# Patient Record
Sex: Female | Born: 1935 | Race: White | Hispanic: No | Smoking: Former smoker
Health system: Southern US, Community
[De-identification: ages and names within clinical notes are randomized; demographics above are authoritative.]

## PROBLEM LIST (undated history)

## (undated) DIAGNOSIS — M8580 Other specified disorders of bone density and structure, unspecified site: Secondary | ICD-10-CM

## (undated) DIAGNOSIS — E039 Hypothyroidism, unspecified: Secondary | ICD-10-CM

## (undated) DIAGNOSIS — Z860101 Personal history of adenomatous and serrated colon polyps: Secondary | ICD-10-CM

## (undated) DIAGNOSIS — Z8554 Personal history of malignant neoplasm of ureter: Secondary | ICD-10-CM

## (undated) DIAGNOSIS — I10 Essential (primary) hypertension: Secondary | ICD-10-CM

## (undated) DIAGNOSIS — Z8551 Personal history of malignant neoplasm of bladder: Secondary | ICD-10-CM

## (undated) DIAGNOSIS — K5790 Diverticulosis of intestine, part unspecified, without perforation or abscess without bleeding: Secondary | ICD-10-CM

## (undated) DIAGNOSIS — M5030 Other cervical disc degeneration, unspecified cervical region: Secondary | ICD-10-CM

## (undated) DIAGNOSIS — R351 Nocturia: Secondary | ICD-10-CM

## (undated) DIAGNOSIS — R001 Bradycardia, unspecified: Secondary | ICD-10-CM

## (undated) DIAGNOSIS — Z86010 Personal history of colonic polyps: Secondary | ICD-10-CM

## (undated) DIAGNOSIS — M1990 Unspecified osteoarthritis, unspecified site: Secondary | ICD-10-CM

## (undated) HISTORY — DX: Diverticulosis of intestine, part unspecified, without perforation or abscess without bleeding: K57.90

## (undated) HISTORY — PX: TRANSURETHRAL RESECTION OF BLADDER: SUR1395

## (undated) HISTORY — DX: Essential (primary) hypertension: I10

## (undated) HISTORY — PX: CATARACT EXTRACTION W/ INTRAOCULAR LENS  IMPLANT, BILATERAL: SHX1307

## (undated) HISTORY — DX: Other specified disorders of bone density and structure, unspecified site: M85.80

## (undated) HISTORY — PX: BACK SURGERY: SHX140

## (undated) HISTORY — DX: Unspecified osteoarthritis, unspecified site: M19.90

## (undated) HISTORY — DX: Hypothyroidism, unspecified: E03.9

---
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
## 1999-11-19 ENCOUNTER — Other Ambulatory Visit: Admission: RE | Admit: 1999-11-19 | Discharge: 1999-11-19 | Payer: Self-pay | Admitting: Family Medicine

## 2001-06-21 ENCOUNTER — Encounter: Payer: Self-pay | Admitting: Family Medicine

## 2001-06-21 ENCOUNTER — Encounter: Admission: RE | Admit: 2001-06-21 | Discharge: 2001-06-21 | Payer: Self-pay | Admitting: Family Medicine

## 2001-11-22 ENCOUNTER — Encounter: Payer: Self-pay | Admitting: Internal Medicine

## 2006-08-17 ENCOUNTER — Other Ambulatory Visit: Admission: RE | Admit: 2006-08-17 | Discharge: 2006-08-17 | Payer: Self-pay | Admitting: Family Medicine

## 2006-08-17 ENCOUNTER — Encounter: Admission: RE | Admit: 2006-08-17 | Discharge: 2006-08-17 | Payer: Self-pay | Admitting: Family Medicine

## 2006-08-31 ENCOUNTER — Encounter: Admission: RE | Admit: 2006-08-31 | Discharge: 2006-08-31 | Payer: Self-pay | Admitting: Family Medicine

## 2007-09-06 HISTORY — PX: LAMINECTOMY: SHX219

## 2007-11-21 ENCOUNTER — Encounter (INDEPENDENT_AMBULATORY_CARE_PROVIDER_SITE_OTHER): Payer: Self-pay | Admitting: *Deleted

## 2007-11-21 ENCOUNTER — Encounter: Admission: RE | Admit: 2007-11-21 | Discharge: 2007-11-21 | Payer: Self-pay | Admitting: Family Medicine

## 2007-12-12 ENCOUNTER — Encounter (INDEPENDENT_AMBULATORY_CARE_PROVIDER_SITE_OTHER): Payer: Self-pay | Admitting: *Deleted

## 2007-12-12 ENCOUNTER — Encounter: Admission: RE | Admit: 2007-12-12 | Discharge: 2007-12-12 | Payer: Self-pay | Admitting: Family Medicine

## 2007-12-26 ENCOUNTER — Ambulatory Visit (HOSPITAL_COMMUNITY): Admission: RE | Admit: 2007-12-26 | Discharge: 2007-12-26 | Payer: Self-pay | Admitting: Urology

## 2007-12-26 ENCOUNTER — Encounter (INDEPENDENT_AMBULATORY_CARE_PROVIDER_SITE_OTHER): Payer: Self-pay | Admitting: Urology

## 2008-02-25 ENCOUNTER — Encounter: Admission: RE | Admit: 2008-02-25 | Discharge: 2008-02-25 | Payer: Self-pay | Admitting: Family Medicine

## 2008-05-30 ENCOUNTER — Inpatient Hospital Stay (HOSPITAL_COMMUNITY): Admission: RE | Admit: 2008-05-30 | Discharge: 2008-06-01 | Payer: Self-pay | Admitting: Orthopedic Surgery

## 2008-10-16 ENCOUNTER — Encounter (INDEPENDENT_AMBULATORY_CARE_PROVIDER_SITE_OTHER): Payer: Self-pay | Admitting: *Deleted

## 2008-12-12 DIAGNOSIS — K5730 Diverticulosis of large intestine without perforation or abscess without bleeding: Secondary | ICD-10-CM | POA: Insufficient documentation

## 2008-12-12 DIAGNOSIS — Z860100 Personal history of colon polyps, unspecified: Secondary | ICD-10-CM | POA: Insufficient documentation

## 2008-12-17 ENCOUNTER — Ambulatory Visit: Payer: Self-pay | Admitting: Internal Medicine

## 2008-12-17 DIAGNOSIS — C679 Malignant neoplasm of bladder, unspecified: Secondary | ICD-10-CM | POA: Insufficient documentation

## 2008-12-31 ENCOUNTER — Encounter (INDEPENDENT_AMBULATORY_CARE_PROVIDER_SITE_OTHER): Payer: Self-pay | Admitting: Urology

## 2008-12-31 ENCOUNTER — Ambulatory Visit (HOSPITAL_BASED_OUTPATIENT_CLINIC_OR_DEPARTMENT_OTHER): Admission: RE | Admit: 2008-12-31 | Discharge: 2008-12-31 | Payer: Self-pay | Admitting: Urology

## 2010-06-29 ENCOUNTER — Ambulatory Visit (HOSPITAL_BASED_OUTPATIENT_CLINIC_OR_DEPARTMENT_OTHER): Admission: RE | Admit: 2010-06-29 | Discharge: 2010-06-29 | Payer: Self-pay | Admitting: Urology

## 2010-09-26 ENCOUNTER — Encounter: Payer: Self-pay | Admitting: Family Medicine

## 2010-11-17 LAB — POCT I-STAT, CHEM 8
BUN: 15 mg/dL (ref 6–23)
Chloride: 104 mEq/L (ref 96–112)
HCT: 49 % — ABNORMAL HIGH (ref 36.0–46.0)
Potassium: 4.4 mEq/L (ref 3.5–5.1)

## 2010-12-15 LAB — URINALYSIS, ROUTINE W REFLEX MICROSCOPIC
Bilirubin Urine: NEGATIVE
Glucose, UA: NEGATIVE mg/dL
Ketones, ur: NEGATIVE mg/dL
Leukocytes, UA: NEGATIVE
pH: 5.5 (ref 5.0–8.0)

## 2010-12-15 LAB — BASIC METABOLIC PANEL
CO2: 32 mEq/L (ref 19–32)
Chloride: 99 mEq/L (ref 96–112)
GFR calc Af Amer: >60 mL/min (ref >60)
Potassium: 4.6 mEq/L (ref 3.5–5.1)

## 2010-12-15 LAB — CBC
HCT: 42.8 % (ref 36.0–46.0)
Hemoglobin: 14.6 g/dL (ref 12.0–15.0)
MCV: 95 fL (ref 78.0–100.0)
RBC: 4.51 MIL/uL (ref 3.87–5.11)
WBC: 10.9 10*3/uL — ABNORMAL HIGH (ref 4.0–10.5)

## 2010-12-15 LAB — URINE MICROSCOPIC-ADD ON

## 2011-01-18 NOTE — Op Note (Signed)
Rebecca Norman, Rebecca Norman               ACCOUNT NO.:  [id number]   MEDICAL RECORD NO.:  [id number]          PATIENT TYPE:  AMB   LOCATION:  DAY                          FACILITY:  [facility]   PHYSICIAN:  Houston M. Kimbrough, M.D.DATE OF BIRTH:  Aug 13, 1925   DATE OF PROCEDURE:  DATE OF DISCHARGE:  12/26/2007                               OPERATIVE REPORT   PREOPERATIVE DIAGNOSIS:  Bladder tumor, left orifice, lateral wall, 3  cm.   POSTOPERATIVE DIAGNOSIS:  Bladder tumor, left orifice, lateral wall, 3  cm.   OPERATION:  TURBT (3 cm) left lateral wall.   ANESTHESIA:  General.   SURGEON:  Courtney Paris, M.D.   BRIEF HISTORY:  This 77 year old lady presented with a history of  hematuria and was found to have a bladder tumor at the left base  overlying the left orifice and the left lateral wall and enters now for  a bladder tumor resection.  She has not smoked for over a year.  She had  no previous operations.  She has been a widow for 16 years, and she  enters now for definitive treatment.   DESCRIPTION OF PROCEDURE:  The patient was placed on the operating table  in the dorsal lithotomy position and after satisfactory induction of  general anesthesia, was prepped and draped with Betadine in the usual  sterile fashion.  Time-out was then done and the patient confirmed.  She  was given Ancef IV preoperatively.  The 26 resectoscope was inserted  with continuous flow.  The bladder was carefully inspected.  No other  lesions were seen except on the left lateral wall just on top of the  left orifice and extending laterally for a short distance.  It measured  about 3 cm across.  It looked papillary in nature.  After photographs  were made, the tumor was resected.  It did not seem to be invasive, and  the resection was complete.  I lightly fulgurated the base because it  was overlying the orifice, and hemostasis was quite good.  The chips  were sent for pathological examination.  The  scope was removed, the  bladder drained and a #16 Foley catheter inserted.  The irrigant was  clear.  She qualified for the STI-612 bladder cancer study protocol, so  she was sent to recovery room and the study drug, either a placebo or a  mitomycin-like drug, will be instilled into the bladder, and then PACU.  She was sent to the PACU in satisfactory condition and will be kept  overnight because of her age.      Courtney Paris, M.D.  Electronically Signed     HMK/MEDQ  D:  12/26/2007  T:  12/26/2007  Job:  [id number]

## 2011-01-18 NOTE — Op Note (Signed)
NAMEJUSTIS, DUPAS               ACCOUNT NO.:  [id number]   MEDICAL RECORD NO.:  [id number]          PATIENT TYPE:  OIB   LOCATION:  1616                         FACILITY:  [facility]   PHYSICIAN:  Georges Lynch. Gioffre, M.D.DATE OF BIRTH:  [birth year]   DATE OF PROCEDURE:  05/30/2008  DATE OF DISCHARGE:                               OPERATIVE REPORT   SURGEON:  Georges Lynch. Darrelyn Hillock, M.D.   ASSISTANT:  Jene Every, M.D.   PREOPERATIVE DIAGNOSES:  1. Spinal stenosis severe at L3-4.  2. Large herniated lumbar disk at L3-4 on the left.   POSTOPERATIVE DIAGNOSES:  1. Spinal stenosis severe at L3-4.  2. Large herniated lumbar disk at L3-4 on the left.   OPERATION:  1. Complete decompressive lumbar laminectomy at L3-4.  2. Microdiskectomy L3-4 on the left with foraminotomies.   PROCEDURE IN DETAIL:  Under general anesthesia routine orthopedic prep  and draping of the lower back was carried out.  Two needles were placed  in the back for localization purposes and an x-ray was taken.  The  patient had 1 g of IV Ancef preop.  At this time after x-ray was taken  two needles were removed and incision was made over the L3-4 space.  Bleeders identified and cauterized.  The incision was extended  proximally and distally for exposure purposes.  I then stripped the  muscle from the spinous process and the lamina bilaterally.  The self-  retaining retractors were inserted.  At this time I then removed as I  mentioned the spinous process of L3, a part of L2 and a part of L4.  I  then went down and did a complete lumbar laminectomy and I preserved the  facets.  I went down centrally and removed the ligamentum flavum after  the microscope was brought in.  We did protect the dura at all times.  At this time the ligamentum flavum was noted to be thickened.  We gently  removed it.  I went out far laterally out into the lateral gutter there  on the left and decompressed the recess and gently retracted the  dura  and there was an extremely large herniated disk.  We cauterized lateral  recess veins with a bipolar.  Following that we made a cruciate incision  into the disk space and did a microdiskectomy and there were large  fragments of disk removed.  Once this was completed we went in with a  nerve hook as well as the Epstein curettes as well and made several  passes into the space medially and laterally as well.  We went up under  the root.  The root now was free.  The dura was free.  We were able to  easily pass a hockey-stick out the foramen of the 4 and 3 root.  We  thoroughly irrigated out the area, loosely inserted some FloSeal then  loosely applied some Gelfoam away from the dura but into  the facet regions.  The wound then was closed in layers in the usual  fashion but I did leave a small deep proximal and distal  part of the  wound open for drainage purposes.  The subcu was closed with 0 Vicryl,  skin with metal staples and a sterile Neosporin dressing was applied.  The patient left the operating room in satisfactory condition.           ______________________________  Georges Lynch Darrelyn Hillock, M.D.     RAG/MEDQ  D:  05/30/2008  T:  05/31/2008  Job:  [id number]   cc:   Courtney Paris, M.D.  Fax: [phone]   Donia Guiles, M.D.  Fax: [phone]

## 2011-01-18 NOTE — Op Note (Signed)
[name], [name]               ACCOUNT NO.:  [id number]   MEDICAL RECORD NO.:  [id number]          PATIENT TYPE:  AMB   LOCATION:  [facility]                         FACILITY:  [facility]   PHYSICIAN:  Courtney Paris, M.D.DATE OF BIRTH:  10-Nov-1924   DATE OF PROCEDURE:  12/31/2008  DATE OF DISCHARGE:                               OPERATIVE REPORT   PREOPERATIVE DIAGNOSIS:  Recurrent bladder tumor, left posterior base.   POSTOPERATIVE DIAGNOSIS:  Recurrent bladder tumor, left posterior base.   OPERATION:  Transurethral resection of bladder tumor (1 cm).   SURGEON:  Courtney Paris, M.D.   ANESTHESIA:  General.   BRIEF HISTORY:  This 77 year old patient had a high-grade but  superficial bladder cancer that was discovered because of his hematuria  on a routine physical then April 2009.  The tumor was completely  resected.  She went on the Spectron then SPI-612 of Eoquin versus  placebo drug study as a surgery adjuvant and put into the bladder at the  time of the postoperative period for noninvasive bladder cancer.  She  had been doing well and having no irritative bladder symptoms.  The last  cysto was January 2010 was negative.  On recent surveillance cysto,  however, some papillary lesions were seen on the left posterior base  just medial to the left orifice.  Enters to have these resected at this  time.   DESCRIPTION OF PROCEDURE:  The patient was placed on the operating table  after induction of general anesthesia, was prepped and draped with  Betadine in the usual sterile fashion.  Time-out was then performed and  the patient and procedure were then reconfirmed.  21 panendoscope was  inserted.  The bladder was carefully inspected using a 30 degrees and a  70 degrees lens.  The tumor at the left posterior base just medial to  the left orifice was photographed and then biopsied with cold cup  forceps.  An area about a centimeter was resected.  The area was then  fulgurated.  Pictures were made of all the steps.  There was very little  bleeding.  Specimen was sent for pathological confirmation.  The rest  the bladder was negative.  Bladder was drained, scope removed and a  Foley catheter not inserted.  The patient was then taken to recovery  room in good condition where she will be later discharged as an  outpatient.      Courtney Paris, M.D.  Electronically Signed     HMK/MEDQ  D:  12/31/2008  T:  12/31/2008  Job:  [id number]

## 2011-04-13 ENCOUNTER — Encounter: Payer: Self-pay | Admitting: Internal Medicine

## 2011-04-13 ENCOUNTER — Ambulatory Visit (INDEPENDENT_AMBULATORY_CARE_PROVIDER_SITE_OTHER): Payer: Medicare Other | Admitting: Internal Medicine

## 2011-04-13 VITALS — BP 128/64 | HR 80 | Ht 65.0 in | Wt 174.0 lb

## 2011-04-13 DIAGNOSIS — K6289 Other specified diseases of anus and rectum: Secondary | ICD-10-CM

## 2011-04-13 NOTE — Progress Notes (Signed)
Rebecca Norman 21-Apr-1925 MRN [id number]    History of Present Illness:  This is an 77 year old white female with sacral and rectal pain of several months duration. It has improved significantly since she made an appointment two months ago. The pain is in the sacral area and bothered her when she was sitting. It was not associated with bowel movements. She denies being constipated. She denies rectal bleeding. She had a colonoscopy in 1995 which showed adenomatous polyps. A repeat colonoscopy in 1997 showed a hyperplastic polyp. Her last colonoscopy in March 2003 also showed a hyperplastic polyp and universal diverticulosis of the colon. We decided at that point that she did not need a recall colonoscopy unless she has specific problems.   Past Medical History  Diagnosis Date  . Hypertension   . Hyperplastic colon polyp   . Osteoarthritis   . Bladder cancer   . Diverticulosis   . Hemorrhoids    Past Surgical History  Procedure Date  . Back surgery   . Transurethral resection of bladder     x 3    reports that she has quit smoking. She has never used smokeless tobacco. She reports that she drinks alcohol. She reports that she does not use illicit drugs. family history includes Breast cancer in her sister and Stomach cancer in her sister.  There is no history of Colon cancer. No Known Allergies      Review of Systems: A radial of soft stools. Denies rectal bleeding. Denies abdominal pain. No upper GI symptoms, no shortness of breath or chest pain  The remainder of the 10  point ROS is negative except as outlined in H&P   Physical Exam: General appearance  Well developed in no distress. Eyes- non icteric. HEENT nontraumatic, normocephalic. Mouth no lesions, tongue papillated, no cheilosis. Neck supple without adenopathy, thyroid not enlarged, no carotid bruits, no JVD. Lungs Clear to auscultation bilaterally. Cor normal S1 normal S2, regular rhythm , no murmur,  quiet  precordium. Abdomen soft nontender abdomen with normal active bowel sounds. No distention. No palpable mass of stool.  Rectal: And anoscopic exam reveals small external hemorrhoidal tags. Decreased rectal sphincter tone. Small internal hemorrhoids, not tender. Digital exam reveals mild tenderness of the coccyx. There is no evidence of fissure. Stool is Hemoccult negative. Exam of the perineum shows mild tenderness of the sacrum to the right. Extremities no pedal edema. Skin no lesions. Neurological alert and oriented x 3. Psychological normal mood and affect.  Assessment and Plan:  The rectal pain is actually originating at the coccyx and sacrum. I could not demonstrate any tenderness or pathology in the rectum. Her symptoms are much improved. I have suggested to using ibuprofen 400 mg twice a day 4 weeks to see if it helps to relieve the  pain in the rectal area. We again had discussed  colonoscopy if she develops specific  symptoms pertaining to her colon. She was satisfied with explanation   04/13/2011 Lina Sar

## 2011-05-31 LAB — BASIC METABOLIC PANEL
BUN: 14
CO2: 30
Chloride: 103
Creatinine, Ser: 0.68

## 2011-05-31 LAB — CBC
MCHC: 34.2
MCV: 94.1
Platelets: 224
WBC: 9

## 2011-05-31 LAB — URINALYSIS, ROUTINE W REFLEX MICROSCOPIC
Ketones, ur: NEGATIVE
Nitrite: NEGATIVE
pH: 5

## 2011-05-31 LAB — URINE MICROSCOPIC-ADD ON

## 2011-06-06 LAB — HEMOGLOBIN AND HEMATOCRIT, BLOOD
HCT: 38
Hemoglobin: 11.7 — ABNORMAL LOW

## 2011-06-06 LAB — URINALYSIS, ROUTINE W REFLEX MICROSCOPIC
Ketones, ur: NEGATIVE
Nitrite: NEGATIVE
Urobilinogen, UA: 1

## 2011-06-06 LAB — CBC
Platelets: 203
WBC: 7.4

## 2011-06-06 LAB — COMPREHENSIVE METABOLIC PANEL
AST: 33
Albumin: 3.8
Alkaline Phosphatase: 76
Chloride: 99
GFR calc Af Amer: >60
Potassium: 3.9
Sodium: 136
Total Bilirubin: 1.1

## 2011-06-06 LAB — DIFFERENTIAL
Basophils Absolute: 0
Basophils Relative: 0
Eosinophils Relative: 2
Monocytes Absolute: 0.9

## 2011-06-06 LAB — URINE CULTURE: Colony Count: 50000

## 2011-06-06 LAB — URINE MICROSCOPIC-ADD ON

## 2011-07-21 ENCOUNTER — Other Ambulatory Visit: Payer: Self-pay | Admitting: Urology

## 2011-08-25 ENCOUNTER — Other Ambulatory Visit: Payer: Self-pay | Admitting: Urology

## 2011-08-31 ENCOUNTER — Encounter (HOSPITAL_BASED_OUTPATIENT_CLINIC_OR_DEPARTMENT_OTHER): Admission: RE | Payer: Self-pay | Source: Ambulatory Visit

## 2011-08-31 ENCOUNTER — Ambulatory Visit (HOSPITAL_BASED_OUTPATIENT_CLINIC_OR_DEPARTMENT_OTHER): Admission: RE | Admit: 2011-08-31 | Payer: Medicare Other | Source: Ambulatory Visit | Admitting: Urology

## 2011-08-31 SURGERY — CYSTOSCOPY, WITH RETROGRADE PYELOGRAM AND URETERAL STENT INSERTION
Anesthesia: General

## 2011-09-26 ENCOUNTER — Encounter (HOSPITAL_BASED_OUTPATIENT_CLINIC_OR_DEPARTMENT_OTHER): Payer: Self-pay | Admitting: *Deleted

## 2011-09-26 NOTE — Progress Notes (Signed)
NPO AFTER MN WITH EXCEPTION WATER/ GATORADE UNTIL 0700. NEEDS ISTAT AND EKG. WILL TAKE NORVASC AM OF SURG. W/ SIP OF WATER.

## 2011-10-03 ENCOUNTER — Ambulatory Visit (HOSPITAL_BASED_OUTPATIENT_CLINIC_OR_DEPARTMENT_OTHER): Payer: Medicare Other | Admitting: Anesthesiology

## 2011-10-03 ENCOUNTER — Encounter (HOSPITAL_BASED_OUTPATIENT_CLINIC_OR_DEPARTMENT_OTHER)
Admission: RE | Disposition: A | Discharge status: Home / Self Care | Payer: Self-pay | Source: Ambulatory Visit | Attending: Urology

## 2011-10-03 ENCOUNTER — Other Ambulatory Visit: Payer: Self-pay

## 2011-10-03 ENCOUNTER — Ambulatory Visit (HOSPITAL_BASED_OUTPATIENT_CLINIC_OR_DEPARTMENT_OTHER)
Admission: RE | Admit: 2011-10-03 | Discharge: 2011-10-03 | Disposition: A | Discharge status: Home / Self Care | Payer: Medicare Other | Source: Ambulatory Visit | Attending: Urology | Admitting: Urology

## 2011-10-03 ENCOUNTER — Encounter (HOSPITAL_BASED_OUTPATIENT_CLINIC_OR_DEPARTMENT_OTHER): Payer: Self-pay | Admitting: Anesthesiology

## 2011-10-03 ENCOUNTER — Encounter (HOSPITAL_BASED_OUTPATIENT_CLINIC_OR_DEPARTMENT_OTHER): Payer: Self-pay | Admitting: *Deleted

## 2011-10-03 ENCOUNTER — Other Ambulatory Visit: Payer: Self-pay | Admitting: Urology

## 2011-10-03 DIAGNOSIS — N3020 Other chronic cystitis without hematuria: Secondary | ICD-10-CM | POA: Diagnosis not present

## 2011-10-03 DIAGNOSIS — Z466 Encounter for fitting and adjustment of urinary device: Secondary | ICD-10-CM | POA: Diagnosis not present

## 2011-10-03 DIAGNOSIS — C672 Malignant neoplasm of lateral wall of bladder: Secondary | ICD-10-CM | POA: Diagnosis not present

## 2011-10-03 DIAGNOSIS — C679 Malignant neoplasm of bladder, unspecified: Secondary | ICD-10-CM | POA: Diagnosis not present

## 2011-10-03 DIAGNOSIS — I10 Essential (primary) hypertension: Secondary | ICD-10-CM | POA: Insufficient documentation

## 2011-10-03 DIAGNOSIS — N3289 Other specified disorders of bladder: Secondary | ICD-10-CM | POA: Diagnosis not present

## 2011-10-03 HISTORY — PX: CYSTOSCOPY W/ RETROGRADES: SHX1426

## 2011-10-03 HISTORY — DX: Nocturia: R35.1

## 2011-10-03 HISTORY — PX: TRANSURETHRAL RESECTION OF BLADDER TUMOR: SHX2575

## 2011-10-03 LAB — POCT I-STAT 4, (NA,K, GLUC, HGB,HCT): Hemoglobin: 15.6 g/dL — ABNORMAL HIGH (ref 12.0–15.0)

## 2011-10-03 SURGERY — TURBT (TRANSURETHRAL RESECTION OF BLADDER TUMOR)
Anesthesia: General | Site: Bladder

## 2011-10-03 MED ORDER — SODIUM CHLORIDE 0.9 % IR SOLN
Status: DC | PRN
  Administered 2011-10-03: 6000 mL

## 2011-10-03 MED ORDER — CIPROFLOXACIN HCL 500 MG PO TABS: 500.0000 mg | ORAL_TABLET | Freq: Two times a day (BID) | ORAL | Status: AC

## 2011-10-03 MED ORDER — FENTANYL CITRATE 0.05 MG/ML IJ SOLN: 25.0000 ug | INTRAMUSCULAR | Status: DC | PRN

## 2011-10-03 MED ORDER — PROMETHAZINE HCL 25 MG/ML IJ SOLN: 6.2500 mg | INTRAMUSCULAR | Status: DC | PRN

## 2011-10-03 MED ORDER — DEXAMETHASONE SODIUM PHOSPHATE 4 MG/ML IJ SOLN
INTRAMUSCULAR | Status: DC | PRN
  Administered 2011-10-03: 8 mg via INTRAVENOUS

## 2011-10-03 MED ORDER — HYOSCYAMINE SULFATE 0.125 MG PO TABS: 0.1250 mg | ORAL_TABLET | ORAL | Status: AC | PRN

## 2011-10-03 MED ORDER — CEFAZOLIN SODIUM 1-5 GM-% IV SOLN
1.0000 g | INTRAVENOUS | Status: AC
  Administered 2011-10-03: 1 g via INTRAVENOUS

## 2011-10-03 MED ORDER — PHENAZOPYRIDINE HCL 100 MG PO TABS: 100.0000 mg | ORAL_TABLET | Freq: Three times a day (TID) | ORAL | Status: AC | PRN

## 2011-10-03 MED ORDER — PROPOFOL 10 MG/ML IV EMUL
INTRAVENOUS | Status: DC | PRN
  Administered 2011-10-03: 100 mg via INTRAVENOUS

## 2011-10-03 MED ORDER — LACTATED RINGERS IV SOLN: INTRAVENOUS | Status: DC

## 2011-10-03 MED ORDER — FENTANYL CITRATE 0.05 MG/ML IJ SOLN
INTRAMUSCULAR | Status: DC | PRN
  Administered 2011-10-03 (×4): 25 ug via INTRAVENOUS

## 2011-10-03 MED ORDER — HYDROCODONE-ACETAMINOPHEN 5-325 MG PO TABS: 1.0000 | ORAL_TABLET | ORAL | Status: AC | PRN

## 2011-10-03 MED ORDER — EPHEDRINE SULFATE 50 MG/ML IJ SOLN
INTRAMUSCULAR | Status: DC | PRN
  Administered 2011-10-03 (×2): 10 mg via INTRAVENOUS

## 2011-10-03 MED ORDER — SENNOSIDES-DOCUSATE SODIUM 8.6-50 MG PO TABS: 1.0000 | ORAL_TABLET | Freq: Two times a day (BID) | ORAL | Status: AC

## 2011-10-03 MED ORDER — LIDOCAINE HCL (CARDIAC) 20 MG/ML IV SOLN
INTRAVENOUS | Status: DC | PRN
  Administered 2011-10-03: 60 mg via INTRAVENOUS

## 2011-10-03 MED ORDER — BELLADONNA ALKALOIDS-OPIUM 16.2-60 MG RE SUPP
RECTAL | Status: DC | PRN
  Administered 2011-10-03: 1 via RECTAL

## 2011-10-03 MED ORDER — ONDANSETRON HCL 4 MG/2ML IJ SOLN
INTRAMUSCULAR | Status: DC | PRN
  Administered 2011-10-03: 4 mg via INTRAVENOUS

## 2011-10-03 MED ORDER — LACTATED RINGERS IV SOLN
INTRAVENOUS | Status: DC
  Administered 2011-10-03: 12:00:00 via INTRAVENOUS

## 2011-10-03 MED ORDER — IOHEXOL 350 MG/ML SOLN
INTRAVENOUS | Status: DC | PRN
  Administered 2011-10-03: 50 mL

## 2011-10-03 MED ORDER — SODIUM CHLORIDE 0.9 % IV SOLN
INTRAVENOUS | Status: DC | PRN
  Administered 2011-10-03: 500 mL

## 2011-10-03 SURGICAL SUPPLY — 62 items
ADAPTER CATH URET PLST 4-6FR (CATHETERS) ×2 IMPLANT
ADPR CATH URET STRL DISP 4-6FR (CATHETERS) ×3
BAG DRAIN URO-CYSTO SKYTR STRL (DRAIN) ×4 IMPLANT
BAG DRN ANRFLXCHMBR STRAP LEK (BAG)
BAG DRN UROCATH (DRAIN) ×3
BAG URINE DRAINAGE (UROLOGICAL SUPPLIES) IMPLANT
BAG URINE LEG 19OZ MD ST LTX (BAG) IMPLANT
BASKET LASER NITINOL 1.9FR (BASKET) IMPLANT
BASKET STNLS GEMINI 4WIRE 3FR (BASKET) IMPLANT
BASKET ZERO TIP NITINOL 2.4FR (BASKET) IMPLANT
BRUSH URET BIOPSY 3F (UROLOGICAL SUPPLIES) IMPLANT
BSKT STON RTRVL 120 1.9FR (BASKET)
BSKT STON RTRVL GEM 120X11 3FR (BASKET)
BSKT STON RTRVL ZERO TP 2.4FR (BASKET)
CANISTER SUCT LVC 12 LTR MEDI- (MISCELLANEOUS) ×6 IMPLANT
CATH FOLEY 2WAY SLVR  5CC 16FR (CATHETERS) ×1
CATH FOLEY 2WAY SLVR  5CC 20FR (CATHETERS)
CATH FOLEY 2WAY SLVR  5CC 22FR (CATHETERS)
CATH FOLEY 2WAY SLVR  5CC 24FR (CATHETERS) ×1
CATH FOLEY 2WAY SLVR 5CC 16FR (CATHETERS) ×1 IMPLANT
CATH FOLEY 2WAY SLVR 5CC 20FR (CATHETERS) IMPLANT
CATH FOLEY 2WAY SLVR 5CC 22FR (CATHETERS) IMPLANT
CATH FOLEY 2WAY SLVR 5CC 24FR (CATHETERS) ×3 IMPLANT
CATH HEMA 3WAY 30CC 24FR COUDE (CATHETERS) ×4 IMPLANT
CATH HEMA 3WAY 30CC 24FR RND (CATHETERS) IMPLANT
CATH INTERMIT  6FR 70CM (CATHETERS) ×2 IMPLANT
CATH URET 5FR 28IN CONE TIP (BALLOONS)
CATH URET 5FR 28IN OPEN ENDED (CATHETERS) IMPLANT
CATH URET 5FR 70CM CONE TIP (BALLOONS) IMPLANT
CLOTH BEACON ORANGE TIMEOUT ST (SAFETY) ×4 IMPLANT
DRAPE CAMERA CLOSED 9X96 (DRAPES) ×4 IMPLANT
ELECT BUTTON BIOP 24F 90D PLAS (MISCELLANEOUS) IMPLANT
ELECT BUTTON HF 24-28F 2 30DE (ELECTRODE) IMPLANT
ELECT LOOP MED HF 24F 12D (CUTTING LOOP) ×4 IMPLANT
ELECT REM PT RETURN 9FT ADLT (ELECTROSURGICAL) ×4
ELECTRODE REM PT RTRN 9FT ADLT (ELECTROSURGICAL) ×3 IMPLANT
EVACUATOR MICROVAS BLADDER (UROLOGICAL SUPPLIES) IMPLANT
GLOVE BIO SURGEON STRL SZ8 (GLOVE) ×4 IMPLANT
GLOVE BIOGEL PI IND STRL 6.5 (GLOVE) ×2 IMPLANT
GLOVE BIOGEL PI INDICATOR 6.5 (GLOVE) ×2
GLOVE ECLIPSE 7.0 STRL STRAW (GLOVE) ×4 IMPLANT
GLOVE INDICATOR 7.5 STRL GRN (GLOVE) ×4 IMPLANT
GOWN PREVENTION PLUS LG XLONG (DISPOSABLE) ×4 IMPLANT
GOWN STRL REIN XL XLG (GOWN DISPOSABLE) ×4 IMPLANT
GUIDEWIRE 0.038 PTFE COATED (WIRE) IMPLANT
GUIDEWIRE ANG ZIPWIRE 038X150 (WIRE) IMPLANT
GUIDEWIRE STR DUAL SENSOR (WIRE) ×4 IMPLANT
HOLDER FOLEY CATH W/STRAP (MISCELLANEOUS) IMPLANT
IV NS IRRIG 3000ML ARTHROMATIC (IV SOLUTION) ×4 IMPLANT
KIT ASPIRATION TUBING (SET/KITS/TRAYS/PACK) ×2 IMPLANT
KIT BALLIN UROMAX 15FX10 (LABEL) IMPLANT
KIT BALLN UROMAX 15FX4 (MISCELLANEOUS) IMPLANT
KIT BALLN UROMAX 26 75X4 (MISCELLANEOUS)
LASER FIBER DISP (UROLOGICAL SUPPLIES) IMPLANT
NS IRRIG 500ML POUR BTL (IV SOLUTION) IMPLANT
PACK CYSTOSCOPY (CUSTOM PROCEDURE TRAY) ×4 IMPLANT
PLUG CATH AND CAP STER (CATHETERS) IMPLANT
SET HIGH PRES BAL DIL (LABEL)
SHEATH URET ACCESS 12FR/35CM (UROLOGICAL SUPPLIES) IMPLANT
SHEATH URET ACCESS 12FR/55CM (UROLOGICAL SUPPLIES) IMPLANT
SYRINGE 10CC LL (SYRINGE) ×2 IMPLANT
SYRINGE IRR TOOMEY STRL 70CC (SYRINGE) ×2 IMPLANT

## 2011-10-03 NOTE — H&P (Signed)
Urology History and Physical Exam  CC: Bladder tumor  HPI:  78 year old female with history of bladder cancer.  Office cystoscopy 07/19/11 revealed a bladder tumor on the right posterior wall of the bladder.  CT abdomen/pelvis hematuria protocol 07/15/11 showed no upper tract lesions, but the bilateral distal ureters are not completely filled out.  Patient presents today for cystoscopy, TURBT, bilateral retrograde pyelograms, possible bilateral ureteroscopy with biopsy and ureter stent placement.  Her last surgery on 08/31/11 was canceled due to the fact that she had bronchitis.  She states she is doing better now. We have discussed the risks, benefits, alternatives, and likelihood of achieving her goals.    PMH: Past Medical History  Diagnosis Date  . Hypertension   . Hyperplastic colon polyp   . Osteoarthritis   . Diverticulosis   . Hemorrhoids   . Bladder cancer S/P TURBT'S  . Nocturia   . Dry cough     PSH: Past Surgical History  Procedure Date  . Transurethral resection of bladder 2009; 2010; 06-29-2010    x 3  . Laminectomy 2009    L3 - 4  . Cataract extraction w/ intraocular lens  implant, bilateral     Allergies: No Known Allergies  Medications: No prescriptions prior to admission     Social History: History   Social History  . Marital Status: Widowed    Spouse Name: N/A    Number of Children: N/A  . Years of Education: N/A   Occupational History  . Retired    Social History Main Topics  . Smoking status: Former Smoker -- 10 years    Types: Cigarettes    Quit date: 09/25/2005  . Smokeless tobacco: Never Used  . Alcohol Use: 1.8 oz/week    3 Glasses of wine per week     3 drinks wine per week  . Drug Use: No  . Sexually Active: Not on file   Other Topics Concern  . Not on file   Social History Narrative  . No narrative on file    Family History: Family History  Problem Relation Age of Onset  . Colon cancer Neg Hx   . Breast cancer Sister    . Stomach cancer Sister     Review of Systems: Positive: None Negative: Chest pain, SOB, gross hematuria.  A further 10 point review of systems was negative except what is listed in the HPI.  Physical Exam:  General: No acute distress.  Awake. Head:  Normocephalic.  Atraumatic. ENT:  EOMI.  Mucous membranes moist Neck:  Supple.  No lymphadenopathy. CV:  S1 present. S2 present. Regular rate. Pulmonary: Equal effort bilaterally.  Clear to auscultation bilaterally. Abdomen: Soft.  Non-tender to palpation. Skin:  Normal turgor.  No visible rash. Extremity: No gross deformity of bilateral upper extremities.  No gross deformity of    bilateral lower extremities. Neurologic: Alert. Appropriate mood.    Studies:  No results found for this basename: HGB:2,WBC:2,PLT:2 in the last 72 hours  No results found for this basename: NA:2,K:2,CL:2,CO2:2,BUN:2,CREATININE:2,CALCIUM:2,MAGNESIUM:2,GFRNONAA:2,GFRAA:2 in the last 72 hours   No results found for this basename: PT:2,INR:2,APTT:2 in the last 72 hours   No components found with this basename: ABG:2    Assessment:  Bladder tumor.    Plan: To OR for cystoscopy, TURBT, bilateral RPG, possible bilateral ureteroscopy with biopsy/laser ablation and ureter stent placement.

## 2011-10-03 NOTE — Brief Op Note (Signed)
10/03/2011  1:53 PM  PATIENT:  Rebecca Norman  78 y.o. female  PRE-OPERATIVE DIAGNOSIS:  Bladder Cancer  POST-OPERATIVE DIAGNOSIS:  Bladder Cancer  PROCEDURE:  Procedure(s): TRANSURETHRAL RESECTION OF BLADDER TUMOR (TURBT) CYSTOSCOPY WITH RETROGRADE PYELOGRAM  SURGEON:  Surgeon(s): Milford Cage, MD  PHYSICIAN ASSISTANT:   ASSISTANTS: none   ANESTHESIA:   general  EBL:  Total I/O In: 100 [I.V.:100] Out: -   BLOOD ADMINISTERED:none  DRAINS: Urinary Catheter (Foley)   LOCAL MEDICATIONS USED:  NONE  SPECIMEN:  Source of Specimen:  bladder  DISPOSITION OF SPECIMEN:  PATHOLOGY  COUNTS:  YES  TOURNIQUET:  * No tourniquets in log *  DICTATION: .Other Dictation: Dictation Number [id number]  PLAN OF CARE: Discharge to home after PACU  PATIENT DISPOSITION:  PACU - hemodynamically stable.   Delay start of Pharmacological VTE agent (>24hrs) due to surgical blood loss or risk of bleeding:  No

## 2011-10-03 NOTE — Anesthesia Postprocedure Evaluation (Signed)
  Anesthesia Post-op Note  Patient: Rebecca Norman  Procedure(s) Performed:  TRANSURETHRAL RESECTION OF BLADDER TUMOR (TURBT); CYSTOSCOPY WITH RETROGRADE PYELOGRAM  Patient Location: PACU  Anesthesia Type: General  Level of Consciousness: awake and alert   Airway and Oxygen Therapy: Patient Spontanous Breathing  Post-op Pain: mild  Post-op Assessment: Post-op Vital signs reviewed, Patient's Cardiovascular Status Stable, Respiratory Function Stable, Patent Airway and No signs of Nausea or vomiting  Post-op Vital Signs: stable  Complications: No apparent anesthesia complications

## 2011-10-03 NOTE — Transfer of Care (Signed)
Immediate Anesthesia Transfer of Care Note  Patient: Rebecca Norman  Procedure(s) Performed:  TRANSURETHRAL RESECTION OF BLADDER TUMOR (TURBT); CYSTOSCOPY WITH RETROGRADE PYELOGRAM  Patient Location: PACU  Anesthesia Type: General  Level of Consciousness: awake, alert  and oriented  Airway & Oxygen Therapy: Patient Spontanous Breathing and Patient connected to face mask oxygen  Post-op Assessment: Report given to PACU RN and Post -op Vital signs reviewed and stable  Post vital signs: Reviewed and stable  Complications: No apparent anesthesia complications

## 2011-10-03 NOTE — Anesthesia Procedure Notes (Signed)
Procedure Name: LMA Insertion Date/Time: 10/03/2011 1:14 PM Performed by: Huel Coventry Pre-anesthesia Checklist: Patient identified, Emergency Drugs available, Suction available and Patient being monitored Patient Re-evaluated:Patient Re-evaluated prior to inductionOxygen Delivery Method: Circle System Utilized Preoxygenation: Pre-oxygenation with 100% oxygen Intubation Type: IV induction Ventilation: Mask ventilation without difficulty LMA: LMA inserted LMA Size: 4.0 Number of attempts: 1 Airway Equipment and Method: bite block Placement Confirmation: positive ETCO2 Tube secured with: Tape Dental Injury: Teeth and Oropharynx as per pre-operative assessment

## 2011-10-03 NOTE — Anesthesia Preprocedure Evaluation (Addendum)
Anesthesia Evaluation  Patient identified by MRN, date of birth, ID band Patient awake    Reviewed: Allergy & Precautions, H&P , NPO status , Patient's Chart, lab work & pertinent test results  History of Anesthesia Complications Negative for: history of anesthetic complications  Airway Mallampati: II TM Distance: >3 FB     Dental  (+) Edentulous Upper, Partial Lower, Upper Dentures, Poor Dentition and Dental Advisory Given   Pulmonary neg pulmonary ROS,  clear to auscultation  Pulmonary exam normal       Cardiovascular hypertension, Pt. on medications neg cardio ROS Regular Normal    Neuro/Psych Negative Neurological ROS  Negative Psych ROS   GI/Hepatic negative GI ROS, Neg liver ROS,   Endo/Other  Negative Endocrine ROS  Renal/GU negative Renal ROS   Neoplasm bladder     Musculoskeletal negative musculoskeletal ROS (+) Arthritis -, Osteoarthritis,    Abdominal   Peds negative pediatric ROS (+)  Hematology negative hematology ROS (+)   Anesthesia Other Findings   Reproductive/Obstetrics negative OB ROS                           Anesthesia Physical Anesthesia Plan  ASA: II  Anesthesia Plan: General   Post-op Pain Management:    Induction: Intravenous  Airway Management Planned: Oral ETT  Additional Equipment:   Intra-op Plan:   Post-operative Plan:   Informed Consent: I have reviewed the patients History and Physical, chart, labs and discussed the procedure including the risks, benefits and alternatives for the proposed anesthesia with the patient or authorized representative who has indicated his/her understanding and acceptance.   Dental advisory given  Plan Discussed with: CRNA  Anesthesia Plan Comments:         Anesthesia Quick Evaluation

## 2011-10-04 ENCOUNTER — Encounter (HOSPITAL_BASED_OUTPATIENT_CLINIC_OR_DEPARTMENT_OTHER): Payer: Self-pay | Admitting: Urology

## 2011-10-04 NOTE — Op Note (Signed)
NAMEJAIDON, SPONSEL NO.:  [id number]  MEDICAL RECORD NO.:  [id number]  LOCATION:                               FACILITY:  [facility]  PHYSICIAN:  Natalia Leatherwood, MD    DATE OF BIRTH:  Feb 11, 1925  DATE OF PROCEDURE:  10/03/2011 DATE OF DISCHARGE:                              OPERATIVE REPORT   SURGEON:  Natalia Leatherwood, MD.  ASSISTANT:  None.  PREOPERATIVE DIAGNOSIS:  Bladder tumor.  POSTOPERATIVE DIAGNOSIS:  Bladder tumor.  PROCEDURES PERFORMED: 1. Cystoscopy. 2. Transurethral resection of bladder tumor, >0.5 cm & <2 cm. 3. Bilateral retrograde pyelogram. 4. Fluoroscopy.  FINDINGS:  Sessile bladder tumor on the left bladder floor on the posterior portion of the bladder and no filling defects on the bilateral retrograde pyelograms.  COMPLICATIONS:  None.  DRAINS:  Foley catheter.  SPECIMEN:  Bladder biopsies and transurethral resection of bladder tumor sent for pathology.  HISTORY OF PRESENT ILLNESS:  An 78 year old female, has history of a bladder tumor.  She percent to my clinic where an office cystoscopy was performed and she was found to have a bladder tumor.  She had a CT scan as well, which showed no renal tumors but the distal ureters did not completely opacify.  Because of this, I recommended retrograde pyelograms at the time of the procedure.  She presented today for elective procedure.  PROCEDURE IN DETAIL:  Informed was obtained.  The patient was taken to the operating room where she was placed in supine position.  IV antibiotics were infused and general anesthesia was induced.  She was then placed in a dorsal lithotomy position, making sure to pad all pertinent neurovascular pressure points appropriately.  Following this, her genitals were prepped and draped in the usual sterile fashion.  A 30 degree cystoscope was advanced through the urethra into the bladder. The entire bladder was evaluated in a systematic fashion with a 30 degree  and a 70 degree lens.  There was noted to be a sessile tumor on the left posterior bladder wall near the floor of the bladder.  There were no other tumors elsewhere in the bladder.  Attention was turned to the left ureteral orifice.  It was cannulated with a 5-French open-ended ureteral Pollock catheter and retrograde pyelogram was obtained.  There was no filling defect in the upper tract or in the entirety of the ureter.  It emptied out well.  Attention was turned to the right ureteral orifice. It was also cannula with an open-ended 5-French ureteral catheter. Retrograde pyelogram was obtained.  Again there was no filling defect in the upper tract or in the entirety of the ureter.  It also emptied out well.  Next cold cup biopsy forceps were used to biopsy the areas around the bladder tumor as it appeared that the tumor was sessile.  These were sent as 1 specimen, and then a gyrus resectoscope was used to resect the bladder tumor itself.  The remaining of the areas were fulgurated.  I was able to see smooth muscle present in her bladder wall, but no fat and no evidence of perforation.  Because her bladder was thin I did not feel that it  would be appropriate to leave a Foley catheter in place. Fulguration and good hemostasis was maintained with the gyrus resectoscope and the tumor was sent for pathology separate from the cold cup biopsies.  After this, a Foley catheter was placed, 10 mL sterile water was placed into the balloon.  A B&O suppository had been placed into her rectum for bladder spasms.  The patient tolerated the procedure well.  This completed the procedure.  She was placed back in supine position.  Anesthesia was reversed.  She was taken to PACU in stable condition.  The patient will return to clinic in 1 week for catheter removal.          ______________________________ Natalia Leatherwood, MD     DW/MEDQ  D:  10/03/2011  T:  10/03/2011  Job:  [id number]

## 2011-10-05 NOTE — Progress Notes (Signed)
Pt arrives to office w c/o of unable to secure foley cath comfortably. Pt states, " The cath bag keeps dragging on the floor & cath tubing pulls. Pt reports I have worked w the cath strap for hours & I can't get the cath tubing in a  comfortable position " Demonstrated the correct technique of how to secure the leg strap without the discomfort she was having. Also gave pt the leg bag, gave instructions how to use the leg bag to include how to secure & switch to the foley cath bag @ bedtime. Pt demonstrates & voices understanding w leg bag & foley cath bag to include switching using as sterile techniques as possible. Family w pt & voices understanding also.

## 2011-10-10 DIAGNOSIS — C679 Malignant neoplasm of bladder, unspecified: Secondary | ICD-10-CM | POA: Diagnosis not present

## 2011-10-14 DIAGNOSIS — Z1231 Encounter for screening mammogram for malignant neoplasm of breast: Secondary | ICD-10-CM | POA: Diagnosis not present

## 2011-10-17 DIAGNOSIS — I10 Essential (primary) hypertension: Secondary | ICD-10-CM | POA: Diagnosis not present

## 2011-10-17 DIAGNOSIS — M899 Disorder of bone, unspecified: Secondary | ICD-10-CM | POA: Diagnosis not present

## 2011-10-17 DIAGNOSIS — M159 Polyosteoarthritis, unspecified: Secondary | ICD-10-CM | POA: Diagnosis not present

## 2011-10-17 DIAGNOSIS — Z0000 Encounter for general adult medical examination without abnormal findings: Secondary | ICD-10-CM | POA: Diagnosis not present

## 2012-01-04 DIAGNOSIS — H5310 Unspecified subjective visual disturbances: Secondary | ICD-10-CM | POA: Diagnosis not present

## 2012-01-04 DIAGNOSIS — H3530 Unspecified macular degeneration: Secondary | ICD-10-CM | POA: Diagnosis not present

## 2012-01-04 DIAGNOSIS — H35379 Puckering of macula, unspecified eye: Secondary | ICD-10-CM | POA: Diagnosis not present

## 2012-01-04 DIAGNOSIS — H35369 Drusen (degenerative) of macula, unspecified eye: Secondary | ICD-10-CM | POA: Diagnosis not present

## 2012-01-09 DIAGNOSIS — C679 Malignant neoplasm of bladder, unspecified: Secondary | ICD-10-CM | POA: Diagnosis not present

## 2012-04-12 DIAGNOSIS — C670 Malignant neoplasm of trigone of bladder: Secondary | ICD-10-CM | POA: Diagnosis not present

## 2012-04-16 DIAGNOSIS — R011 Cardiac murmur, unspecified: Secondary | ICD-10-CM | POA: Diagnosis not present

## 2012-04-16 DIAGNOSIS — M949 Disorder of cartilage, unspecified: Secondary | ICD-10-CM | POA: Diagnosis not present

## 2012-04-16 DIAGNOSIS — C679 Malignant neoplasm of bladder, unspecified: Secondary | ICD-10-CM | POA: Diagnosis not present

## 2012-04-16 DIAGNOSIS — M899 Disorder of bone, unspecified: Secondary | ICD-10-CM | POA: Diagnosis not present

## 2012-04-16 DIAGNOSIS — M159 Polyosteoarthritis, unspecified: Secondary | ICD-10-CM | POA: Diagnosis not present

## 2012-04-16 DIAGNOSIS — I10 Essential (primary) hypertension: Secondary | ICD-10-CM | POA: Diagnosis not present

## 2012-05-17 DIAGNOSIS — Z23 Encounter for immunization: Secondary | ICD-10-CM | POA: Diagnosis not present

## 2012-05-24 DIAGNOSIS — M899 Disorder of bone, unspecified: Secondary | ICD-10-CM | POA: Diagnosis not present

## 2012-05-24 DIAGNOSIS — M949 Disorder of cartilage, unspecified: Secondary | ICD-10-CM | POA: Diagnosis not present

## 2012-07-19 DIAGNOSIS — Z8551 Personal history of malignant neoplasm of bladder: Secondary | ICD-10-CM | POA: Diagnosis not present

## 2012-10-15 DIAGNOSIS — Z1231 Encounter for screening mammogram for malignant neoplasm of breast: Secondary | ICD-10-CM | POA: Diagnosis not present

## 2012-10-15 DIAGNOSIS — Z803 Family history of malignant neoplasm of breast: Secondary | ICD-10-CM | POA: Diagnosis not present

## 2012-10-22 DIAGNOSIS — I10 Essential (primary) hypertension: Secondary | ICD-10-CM | POA: Diagnosis not present

## 2012-10-22 DIAGNOSIS — R011 Cardiac murmur, unspecified: Secondary | ICD-10-CM | POA: Diagnosis not present

## 2012-10-22 DIAGNOSIS — Z1331 Encounter for screening for depression: Secondary | ICD-10-CM | POA: Diagnosis not present

## 2012-10-22 DIAGNOSIS — Z23 Encounter for immunization: Secondary | ICD-10-CM | POA: Diagnosis not present

## 2012-10-22 DIAGNOSIS — M899 Disorder of bone, unspecified: Secondary | ICD-10-CM | POA: Diagnosis not present

## 2012-10-22 DIAGNOSIS — M159 Polyosteoarthritis, unspecified: Secondary | ICD-10-CM | POA: Diagnosis not present

## 2012-10-22 DIAGNOSIS — M949 Disorder of cartilage, unspecified: Secondary | ICD-10-CM | POA: Diagnosis not present

## 2012-10-22 DIAGNOSIS — C679 Malignant neoplasm of bladder, unspecified: Secondary | ICD-10-CM | POA: Diagnosis not present

## 2012-11-08 DIAGNOSIS — Z8551 Personal history of malignant neoplasm of bladder: Secondary | ICD-10-CM | POA: Diagnosis not present

## 2013-01-04 DIAGNOSIS — Z961 Presence of intraocular lens: Secondary | ICD-10-CM | POA: Diagnosis not present

## 2013-01-04 DIAGNOSIS — H35369 Drusen (degenerative) of macula, unspecified eye: Secondary | ICD-10-CM | POA: Diagnosis not present

## 2013-01-04 DIAGNOSIS — H0230 Blepharochalasis unspecified eye, unspecified eyelid: Secondary | ICD-10-CM | POA: Diagnosis not present

## 2013-01-04 DIAGNOSIS — H35379 Puckering of macula, unspecified eye: Secondary | ICD-10-CM | POA: Diagnosis not present

## 2013-01-08 DIAGNOSIS — H35379 Puckering of macula, unspecified eye: Secondary | ICD-10-CM | POA: Diagnosis not present

## 2013-01-08 DIAGNOSIS — H35369 Drusen (degenerative) of macula, unspecified eye: Secondary | ICD-10-CM | POA: Diagnosis not present

## 2013-01-08 DIAGNOSIS — H353190 Nonexudative age-related macular degeneration, unspecified eye, stage unspecified: Secondary | ICD-10-CM | POA: Diagnosis not present

## 2013-02-22 DIAGNOSIS — Z8551 Personal history of malignant neoplasm of bladder: Secondary | ICD-10-CM | POA: Diagnosis not present

## 2013-04-23 DIAGNOSIS — R809 Proteinuria, unspecified: Secondary | ICD-10-CM | POA: Diagnosis not present

## 2013-04-23 DIAGNOSIS — M899 Disorder of bone, unspecified: Secondary | ICD-10-CM | POA: Diagnosis not present

## 2013-04-23 DIAGNOSIS — I10 Essential (primary) hypertension: Secondary | ICD-10-CM | POA: Diagnosis not present

## 2013-04-23 DIAGNOSIS — R011 Cardiac murmur, unspecified: Secondary | ICD-10-CM | POA: Diagnosis not present

## 2013-04-23 DIAGNOSIS — C679 Malignant neoplasm of bladder, unspecified: Secondary | ICD-10-CM | POA: Diagnosis not present

## 2013-04-23 DIAGNOSIS — M159 Polyosteoarthritis, unspecified: Secondary | ICD-10-CM | POA: Diagnosis not present

## 2013-05-23 DIAGNOSIS — R3129 Other microscopic hematuria: Secondary | ICD-10-CM | POA: Diagnosis not present

## 2013-05-23 DIAGNOSIS — Z8551 Personal history of malignant neoplasm of bladder: Secondary | ICD-10-CM | POA: Diagnosis not present

## 2013-05-27 DIAGNOSIS — Z8551 Personal history of malignant neoplasm of bladder: Secondary | ICD-10-CM | POA: Diagnosis not present

## 2013-05-27 DIAGNOSIS — R82998 Other abnormal findings in urine: Secondary | ICD-10-CM | POA: Diagnosis not present

## 2013-05-30 DIAGNOSIS — Z23 Encounter for immunization: Secondary | ICD-10-CM | POA: Diagnosis not present

## 2013-07-08 DIAGNOSIS — K6289 Other specified diseases of anus and rectum: Secondary | ICD-10-CM | POA: Diagnosis not present

## 2013-07-10 ENCOUNTER — Ambulatory Visit (INDEPENDENT_AMBULATORY_CARE_PROVIDER_SITE_OTHER): Payer: Medicare Other | Admitting: Physician Assistant

## 2013-07-10 ENCOUNTER — Encounter: Payer: Self-pay | Admitting: Physician Assistant

## 2013-07-10 VITALS — BP 126/62 | HR 76 | Ht 64.0 in | Wt 173.2 lb

## 2013-07-10 DIAGNOSIS — Z9889 Other specified postprocedural states: Secondary | ICD-10-CM

## 2013-07-10 DIAGNOSIS — M533 Sacrococcygeal disorders, not elsewhere classified: Secondary | ICD-10-CM | POA: Diagnosis not present

## 2013-07-10 DIAGNOSIS — I10 Essential (primary) hypertension: Secondary | ICD-10-CM | POA: Diagnosis not present

## 2013-07-10 NOTE — Progress Notes (Signed)
Reviewed, was there tender coccyx ?

## 2013-07-10 NOTE — Progress Notes (Signed)
Subjective:    Patient ID: Rebecca Norman, female    DOB: March 23, 1925, 79 y.o.   MRN: [id number]  HPI  Rebecca Norman is a very nice 79 year old white female generally in good health. She does have history of bladder cancer, hypertension and is status post lumbar laminectomy. She is known to Dr. Juanda Chance  and has history of diverticulosis and colon polyps. Initial colonoscopy in 1995 was positive for adenomatous polyps however she had repeat colonoscopy in 1997 with finding of only a hyperplastic polyp and again in 2003 with finding of one hyperplastic polyp. She was last seen in August of 2012 and at that time was complaining of rectal pain however on exam and by history it was felt that her pain was secondary to coccydynia. She had endoscopy by Dr. Juanda Chance which was unremarkable was treated with anti-inflammatories and decision was made not to pursue repeat colonoscopy.  Today the patient is referred back by her primary care physician again with complaints of rectal pain. However the patient relates that her pain started about 2 weeks ago after she had ridden  her exercise bike for a couple of consecutive days. She said that she had not been on the bike for some time. She started noticing pain after that only with sitting and says she is still only noticing pain with sitting. She has no pain with the other positions ,no pain with bowel movements, no abdominal pain changes in bowel habits, melena, or hematochezia. She says the pain has been a bit more noticeable the past couple of days. She try Preparation H for possible hemorrhoidal symptoms without any relief.    Review of Systems  Constitutional: Negative.   HENT: Negative.   Eyes: Negative.   Respiratory: Negative.   Cardiovascular: Negative.   Gastrointestinal: Negative.   Endocrine: Negative.   Genitourinary: Negative.   Musculoskeletal: Positive for arthralgias and back pain.  Skin: Negative.   Allergic/Immunologic: Negative.   Neurological:  Negative.   Hematological: Negative.   Psychiatric/Behavioral: Negative.    Outpatient Prescriptions Prior to Visit  Medication Sig Dispense Refill  . amLODipine (NORVASC) 10 MG tablet Take 10 mg by mouth every morning. One by mouth once daily      . losartan-hydrochlorothiazide (HYZAAR) 100-25 MG per tablet Take 1 tablet by mouth every morning. One by mouth once daily      . Pramox-PE-Glycerin-Petrolatum (PREPARATION H RE) Place rectally as needed.        No facility-administered medications prior to visit.       Allergies  Allergen Reactions  . Flagyl [Metronidazole] Hives   Patient Active Problem List   Diagnosis Date Noted  . S/P laminectomy 07/10/2013  . HTN (hypertension) 07/10/2013  . BLADDER CANCER 12/17/2008  . DIVERTICULOSIS OF COLON 12/12/2008  . COLONIC POLYPS, HYPERPLASTIC, HX OF 12/12/2008   History  Substance Use Topics  . Smoking status: Former Smoker -- 10 years    Types: Cigarettes    Quit date: 09/25/2005  . Smokeless tobacco: Never Used  . Alcohol Use: 1.8 oz/week    3 Glasses of wine per week     Comment: 3 drinks wine per week  family history includes Breast cancer in her sister; Stomach cancer in her sister. There is no history of Colon cancer.  Objective:   Physical Exam  well-developed elderly white female in no acute distress, pleasant blood pressure 126/62 pulse 76 height 5 foot 4 weight 173. HEENT; nontraumatic normocephalic EOMI PERRLA sclera anicteric, Supple ;no JVD, Cardiovascular; regular  rate and rhythm with S1-S2 no murmur or gallop, Pulm; clear bilaterally, Abdomen; soft nontender nondistended bowel sounds are active there is no palpable mass or hepatosplenomegaly, Back; no tenderness to palpation of the lumbar spine she does have a scar from prior laminectomy she is exquisitely tender to pressure over the coccyx especially on the right., Rectal; exam no external lesion noted no tenderness on digital exam and no abnormality felt on anoscopy  she does have stool in the rectal vault, no abnormality seen and stool is Hemoccult negative        Assessment & Plan:  #45  79 year old female with 2 week history of rectal pain which by exam and history is consistent with coccygodynia. Rectal exam and anoscopy are negative and stool is Hemoccult negative #2 history of hyperplastic and adenomatous polyps-last colonoscopy 2003 with hyperplastic polyps only #3 hypertension #4 status post lumbar laminectomy #5 history of bladder cancer  Plan; supportive management. Suggested she used moist heat for 15-20 minutes 3 or 4 times daily over the next couple of weeks, and also try sitting on a doughnut Tylenol as needed for pain, as she is hesitant to use anti-inflammatories She is advised that this may take several weeks to resolve and to avoid use of her exercise bike during that time. She will call back in 4-5 weeks if symptoms are persisting Decision had been made 2 years ago due to advanced age Melvern Banker not to pursue followup colonoscopy and no indication at this time.

## 2013-07-10 NOTE — Patient Instructions (Signed)
Use moist heat 2-3 times daily when resting. Try sitting on a donut for comfort. You can take Tylenol as needed for pain.  Call us back if symptoms don't improve in the next 4-5 weeks.

## 2013-08-05 DIAGNOSIS — H35369 Drusen (degenerative) of macula, unspecified eye: Secondary | ICD-10-CM | POA: Diagnosis not present

## 2013-08-05 DIAGNOSIS — H35379 Puckering of macula, unspecified eye: Secondary | ICD-10-CM | POA: Diagnosis not present

## 2013-08-05 DIAGNOSIS — H353190 Nonexudative age-related macular degeneration, unspecified eye, stage unspecified: Secondary | ICD-10-CM | POA: Diagnosis not present

## 2013-08-08 DIAGNOSIS — L84 Corns and callosities: Secondary | ICD-10-CM | POA: Diagnosis not present

## 2013-08-08 DIAGNOSIS — M25579 Pain in unspecified ankle and joints of unspecified foot: Secondary | ICD-10-CM | POA: Diagnosis not present

## 2013-09-03 DIAGNOSIS — Z8551 Personal history of malignant neoplasm of bladder: Secondary | ICD-10-CM | POA: Diagnosis not present

## 2013-09-25 ENCOUNTER — Ambulatory Visit: Payer: Medicare Other | Admitting: Podiatry

## 2013-10-17 DIAGNOSIS — Z1231 Encounter for screening mammogram for malignant neoplasm of breast: Secondary | ICD-10-CM | POA: Diagnosis not present

## 2013-10-17 DIAGNOSIS — Z803 Family history of malignant neoplasm of breast: Secondary | ICD-10-CM | POA: Diagnosis not present

## 2013-11-06 DIAGNOSIS — Z23 Encounter for immunization: Secondary | ICD-10-CM | POA: Diagnosis not present

## 2013-11-06 DIAGNOSIS — M899 Disorder of bone, unspecified: Secondary | ICD-10-CM | POA: Diagnosis not present

## 2013-11-06 DIAGNOSIS — F4321 Adjustment disorder with depressed mood: Secondary | ICD-10-CM | POA: Diagnosis not present

## 2013-11-06 DIAGNOSIS — I10 Essential (primary) hypertension: Secondary | ICD-10-CM | POA: Diagnosis not present

## 2013-11-06 DIAGNOSIS — M159 Polyosteoarthritis, unspecified: Secondary | ICD-10-CM | POA: Diagnosis not present

## 2013-11-06 DIAGNOSIS — M949 Disorder of cartilage, unspecified: Secondary | ICD-10-CM | POA: Diagnosis not present

## 2013-11-06 DIAGNOSIS — C679 Malignant neoplasm of bladder, unspecified: Secondary | ICD-10-CM | POA: Diagnosis not present

## 2013-11-06 DIAGNOSIS — R011 Cardiac murmur, unspecified: Secondary | ICD-10-CM | POA: Diagnosis not present

## 2013-12-10 DIAGNOSIS — Z8551 Personal history of malignant neoplasm of bladder: Secondary | ICD-10-CM | POA: Diagnosis not present

## 2014-01-14 DIAGNOSIS — M7750 Other enthesopathy of unspecified foot: Secondary | ICD-10-CM | POA: Diagnosis not present

## 2014-02-10 DIAGNOSIS — M79609 Pain in unspecified limb: Secondary | ICD-10-CM | POA: Diagnosis not present

## 2014-02-17 DIAGNOSIS — M76829 Posterior tibial tendinitis, unspecified leg: Secondary | ICD-10-CM | POA: Diagnosis not present

## 2014-02-17 DIAGNOSIS — M19079 Primary osteoarthritis, unspecified ankle and foot: Secondary | ICD-10-CM | POA: Diagnosis not present

## 2014-03-17 DIAGNOSIS — M76829 Posterior tibial tendinitis, unspecified leg: Secondary | ICD-10-CM | POA: Diagnosis not present

## 2014-03-17 DIAGNOSIS — M19079 Primary osteoarthritis, unspecified ankle and foot: Secondary | ICD-10-CM | POA: Diagnosis not present

## 2014-04-02 DIAGNOSIS — M542 Cervicalgia: Secondary | ICD-10-CM | POA: Diagnosis not present

## 2014-04-02 DIAGNOSIS — I889 Nonspecific lymphadenitis, unspecified: Secondary | ICD-10-CM | POA: Diagnosis not present

## 2014-04-07 DIAGNOSIS — H35379 Puckering of macula, unspecified eye: Secondary | ICD-10-CM | POA: Diagnosis not present

## 2014-04-07 DIAGNOSIS — H353190 Nonexudative age-related macular degeneration, unspecified eye, stage unspecified: Secondary | ICD-10-CM | POA: Diagnosis not present

## 2014-04-07 DIAGNOSIS — H35369 Drusen (degenerative) of macula, unspecified eye: Secondary | ICD-10-CM | POA: Diagnosis not present

## 2014-04-08 DIAGNOSIS — M542 Cervicalgia: Secondary | ICD-10-CM | POA: Diagnosis not present

## 2014-04-14 ENCOUNTER — Encounter (HOSPITAL_COMMUNITY): Payer: Self-pay | Admitting: Emergency Medicine

## 2014-04-14 ENCOUNTER — Emergency Department (HOSPITAL_COMMUNITY)
Admission: EM | Admit: 2014-04-14 | Discharge: 2014-04-14 | Disposition: A | Discharge status: Home / Self Care | Payer: Medicare Other | Attending: Emergency Medicine | Admitting: Emergency Medicine

## 2014-04-14 ENCOUNTER — Emergency Department (HOSPITAL_COMMUNITY): Payer: Medicare Other

## 2014-04-14 DIAGNOSIS — I10 Essential (primary) hypertension: Secondary | ICD-10-CM | POA: Insufficient documentation

## 2014-04-14 DIAGNOSIS — M542 Cervicalgia: Secondary | ICD-10-CM | POA: Insufficient documentation

## 2014-04-14 DIAGNOSIS — Z860100 Personal history of colon polyps, unspecified: Secondary | ICD-10-CM | POA: Diagnosis not present

## 2014-04-14 DIAGNOSIS — Z8551 Personal history of malignant neoplasm of bladder: Secondary | ICD-10-CM | POA: Insufficient documentation

## 2014-04-14 DIAGNOSIS — Z8719 Personal history of other diseases of the digestive system: Secondary | ICD-10-CM | POA: Insufficient documentation

## 2014-04-14 DIAGNOSIS — M1990 Unspecified osteoarthritis, unspecified site: Secondary | ICD-10-CM | POA: Insufficient documentation

## 2014-04-14 DIAGNOSIS — Z87891 Personal history of nicotine dependence: Secondary | ICD-10-CM | POA: Insufficient documentation

## 2014-04-14 DIAGNOSIS — M5030 Other cervical disc degeneration, unspecified cervical region: Secondary | ICD-10-CM | POA: Diagnosis not present

## 2014-04-14 DIAGNOSIS — M47812 Spondylosis without myelopathy or radiculopathy, cervical region: Secondary | ICD-10-CM | POA: Diagnosis not present

## 2014-04-14 MED ORDER — PREDNISONE 10 MG PO TABS: 20.0000 mg | ORAL_TABLET | Freq: Every day | ORAL | Status: DC

## 2014-04-14 MED ORDER — MORPHINE SULFATE 2 MG/ML IJ SOLN
2.0000 mg | Freq: Once | INTRAMUSCULAR | Status: AC
  Administered 2014-04-14: 2 mg via INTRAMUSCULAR
  Filled 2014-04-14: qty 1

## 2014-04-14 MED ORDER — DIAZEPAM 2 MG PO TABS: 2.0000 mg | ORAL_TABLET | ORAL | Status: DC | PRN

## 2014-04-14 MED ORDER — DIAZEPAM 2 MG PO TABS
2.0000 mg | ORAL_TABLET | Freq: Once | ORAL | Status: AC
  Administered 2014-04-14: 2 mg via ORAL
  Filled 2014-04-14: qty 1

## 2014-04-14 MED ORDER — HYDROCODONE-ACETAMINOPHEN 5-325 MG PO TABS
1.0000 | ORAL_TABLET | ORAL | Status: DC | PRN
Start: 2014-04-14 — End: 2016-06-14

## 2014-04-14 NOTE — ED Notes (Signed)
Per pt, neck pain for 1 month.  Occasional headache.  Pain waking her up at night and continues throughout day.  Pt was seen by Primary MD last week.  Had appt to see a therapist today which patient has canceled.  Pt was given muscle relaxer and has been taking past 3 days.

## 2014-04-14 NOTE — Discharge Instructions (Signed)
Arthritis, Nonspecific °Arthritis is inflammation of a joint. This usually means pain, redness, warmth or swelling are present. One or more joints may be involved. There are a number of types of arthritis. Your caregiver may not be able to tell what type of arthritis you have right away. °CAUSES  °The most common cause of arthritis is the wear and tear on the joint (osteoarthritis). This causes damage to the cartilage, which can break down over time. The knees, hips, back and neck are most often affected by this type of arthritis. °Other types of arthritis and common causes of joint pain include: °· Sprains and other injuries near the joint. Sometimes minor sprains and injuries cause pain and swelling that develop hours later. °· Rheumatoid arthritis. This affects hands, feet and knees. It usually affects both sides of your body at the same time. It is often associated with chronic ailments, fever, weight loss and general weakness. °· Crystal arthritis. Gout and pseudo gout can cause occasional acute severe pain, redness and swelling in the foot, ankle, or knee. °· Infectious arthritis. Bacteria can get into a joint through a break in overlying skin. This can cause infection of the joint. Bacteria and viruses can also spread through the blood and affect your joints. °· Drug, infectious and allergy reactions. Sometimes joints can become mildly painful and slightly swollen with these types of illnesses. °SYMPTOMS  °· Pain is the main symptom. °· Your joint or joints can also be red, swollen and warm or hot to the touch. °· You may have a fever with certain types of arthritis, or even feel overall ill. °· The joint with arthritis will hurt with movement. Stiffness is present with some types of arthritis. °DIAGNOSIS  °Your caregiver will suspect arthritis based on your description of your symptoms and on your exam. Testing may be needed to find the type of arthritis: °· Blood and sometimes urine tests. °· X-ray tests  and sometimes CT or MRI scans. °· Removal of fluid from the joint (arthrocentesis) is done to check for bacteria, crystals or other causes. Your caregiver (or a specialist) will numb the area over the joint with a local anesthetic, and use a needle to remove joint fluid for examination. This procedure is only minimally uncomfortable. °· Even with these tests, your caregiver may not be able to tell what kind of arthritis you have. Consultation with a specialist (rheumatologist) may be helpful. °TREATMENT  °Your caregiver will discuss with you treatment specific to your type of arthritis. If the specific type cannot be determined, then the following general recommendations may apply. °Treatment of severe joint pain includes: °· Rest. °· Elevation. °· Anti-inflammatory medication (for example, ibuprofen) may be prescribed. Avoiding activities that cause increased pain. °· Only take over-the-counter or prescription medicines for pain and discomfort as recommended by your caregiver. °· Cold packs over an inflamed joint may be used for 10 to 15 minutes every hour. Hot packs sometimes feel better, but do not use overnight. Do not use hot packs if you are diabetic without your caregiver's permission. °· A cortisone shot into arthritic joints may help reduce pain and swelling. °· Any acute arthritis that gets worse over the next 1 to 2 days needs to be looked at to be sure there is no joint infection. °Long-term arthritis treatment involves modifying activities and lifestyle to reduce joint stress jarring. This can include weight loss. Also, exercise is needed to nourish the joint cartilage and remove waste. This helps keep the muscles   around the joint strong. °HOME CARE INSTRUCTIONS  °· Do not take aspirin to relieve pain if gout is suspected. This elevates uric acid levels. °· Only take over-the-counter or prescription medicines for pain, discomfort or fever as directed by your caregiver. °· Rest the joint as much as  possible. °· If your joint is swollen, keep it elevated. °· Use crutches if the painful joint is in your leg. °· Drinking plenty of fluids may help for certain types of arthritis. °· Follow your caregiver's dietary instructions. °· Try low-impact exercise such as: °¨ Swimming. °¨ Water aerobics. °¨ Biking. °¨ Walking. °· Morning stiffness is often relieved by a warm shower. °· Put your joints through regular range-of-motion. °SEEK MEDICAL CARE IF:  °· You do not feel better in 24 hours or are getting worse. °· You have side effects to medications, or are not getting better with treatment. °SEEK IMMEDIATE MEDICAL CARE IF:  °· You have a fever. °· You develop severe joint pain, swelling or redness. °· Many joints are involved and become painful and swollen. °· There is severe back pain and/or leg weakness. °· You have loss of bowel or bladder control. °Document Released: 09/29/2004 Document Revised: 11/14/2011 Document Reviewed: 10/15/2008 °ExitCare® Patient Information ©2015 [employer]. This information is not intended to replace advice given to you by your health care provider. Make sure you discuss any questions you have with your health care provider. ° °

## 2014-04-14 NOTE — ED Provider Notes (Signed)
CSN: [id number]     Arrival date & time 04/14/14  28 History   First MD Initiated Contact with Patient 04/14/14 1041     Chief Complaint  Patient presents with  . Neck Pain     (Consider location/radiation/quality/duration/timing/severity/associated sxs/prior Treatment) Patient is a 80 y.o. female presenting with neck pain. The history is provided by the patient and a relative.  Neck Pain  patient here complaining of neck pain x1 month which is been worse for the past 2 weeks. Pain is at the left side of her neck at the base of skull and it does not radiate to her arm. Has been seen by her physician for this and diagnosed with a pinched nerve. She has been prescribed tramadol as well as muscle accidents. She has used Motrin which is helped somewhat. According to her chart her, and she had blood work that was sent to weeks ago which is reassuring. She does have some colicky headache but no focal neurological deficits. Denies recent history of trauma. Denies any chest pain or shortness of breath. No visual changes. Called her Dr. in today and was told to come here for further evaluation.  Past Medical History  Diagnosis Date  . Hypertension   . Hyperplastic colon polyp   . Osteoarthritis   . Diverticulosis   . Hemorrhoids   . Bladder cancer S/P TURBT'S  . Nocturia   . Dry cough    Past Surgical History  Procedure Laterality Date  . Transurethral resection of bladder  2009; 2010; 06-29-2010    x 3  . Laminectomy  2009    L3 - 4  . Cataract extraction w/ intraocular lens  implant, bilateral Bilateral   . Transurethral resection of bladder tumor  10/03/2011    Procedure: TRANSURETHRAL RESECTION OF BLADDER TUMOR (TURBT);  Surgeon: Molli Hazard, MD;  Location: [facility];  Service: Urology;  Laterality: N/A;  . Cystoscopy w/ retrogrades  10/03/2011    Procedure: CYSTOSCOPY WITH RETROGRADE PYELOGRAM;  Surgeon: Molli Hazard, MD;  Location: [facility];  Service: Urology;;   Family History  Problem Relation Age of Onset  . Colon cancer Neg Hx   . Breast cancer Sister   . Stomach cancer Sister    History  Substance Use Topics  . Smoking status: Former Smoker -- 10 years    Types: Cigarettes    Quit date: 09/25/2005  . Smokeless tobacco: Never Used  . Alcohol Use: 1.8 oz/week    3 Glasses of wine per week     Comment: 3 drinks wine per week   OB History   Grav Para Term Preterm Abortions TAB SAB Ect Mult Living                 Review of Systems  Musculoskeletal: Positive for neck pain.  All other systems reviewed and are negative.     Allergies  Flagyl  Home Medications   Prior to Admission medications   Medication Sig Start Date End Date Taking? Authorizing Provider  acetaminophen (TYLENOL) 500 MG tablet Take 500 mg by mouth every 6 (six) hours as needed.    Historical Provider, MD  amLODipine (NORVASC) 10 MG tablet Take 10 mg by mouth every morning. One by mouth once daily 04/12/11   Historical Provider, MD  losartan-hydrochlorothiazide (HYZAAR) 100-25 MG per tablet Take 1 tablet by mouth every morning. One by mouth once daily 03/22/11   Historical Provider, MD  Pramox-PE-Glycerin-Petrolatum (PREPARATION H RE)  Place rectally as needed.     Historical Provider, MD   BP 127/56  Pulse 72  Temp(Src) 98.3 F (36.8 C) (Oral)  Resp 20  SpO2 95% Physical Exam  Nursing note and vitals reviewed. Constitutional: She is oriented to person, place, and time. She appears well-developed and well-nourished.  Non-toxic appearance. No distress.  HENT:  Head: Normocephalic and atraumatic.  Eyes: Conjunctivae, EOM and lids are normal. Pupils are equal, round, and reactive to light.  Neck: Normal range of motion. Neck supple. No tracheal deviation present. No mass present.    Cardiovascular: Normal rate, regular rhythm and normal heart sounds.  Exam reveals no gallop.   No murmur heard. Pulmonary/Chest: Effort  normal and breath sounds normal. No stridor. No respiratory distress. She has no decreased breath sounds. She has no wheezes. She has no rhonchi. She has no rales.  Abdominal: Soft. Normal appearance and bowel sounds are normal. She exhibits no distension. There is no tenderness. There is no rebound and no CVA tenderness.  Musculoskeletal: Normal range of motion. She exhibits no edema and no tenderness.  Neurological: She is alert and oriented to person, place, and time. She has normal strength. No cranial nerve deficit or sensory deficit. GCS eye subscore is 4. GCS verbal subscore is 5. GCS motor subscore is 6.  Skin: Skin is warm and dry. No abrasion and no rash noted.  Psychiatric: She has a normal mood and affect. Her speech is normal and behavior is normal.    ED Course  Procedures (including critical care time) Labs Review Labs Reviewed - No data to display  Imaging Review Ct Cervical Spine Wo Contrast  04/14/2014   CLINICAL DATA:  Left-sided and posterior neck pain.  EXAM: CT CERVICAL SPINE WITHOUT CONTRAST  TECHNIQUE: Multidetector CT imaging of the cervical spine was performed without intravenous contrast. Multiplanar CT image reconstructions were also generated.  COMPARISON:  None.  FINDINGS: C1 with the occipital condyles and C1-2: Slight degenerative changes of the articulations.  C2-3 through C4-5: Auto fusion of the right facet joints at each level and auto fusion of left facet joints at C2-3 and C3-4. No neural impingement.  C5-6: Severe degenerative disc disease. Bilateral foraminal stenosis. Moderate right facet arthritis.  C6-7: Severe degenerative disc disease with no foraminal stenosis. Minimal degenerative changes of the facet joints.  C7-T1: 2 mm spondylolisthesis with severe bilateral facet arthritis, left worse than right. There is some gas in the soft tissues from the joint.  T1-2 and T2-3:  Normal.  IMPRESSION: 1. Severe left facet arthritis at C7-T1 with grade 1  spondylolisthesis of C7 on T1. 2. Severe degenerative disc disease at C5-6 and C6-7 with bilateral foraminal stenosis at C5-6. 3. Auto fusion from C2 through C5.   Electronically Signed   By: Rozetta Nunnery M.D.   On: 04/14/2014 11:44     EKG Interpretation None      MDM   Final diagnoses:  None   Patient given pain meds and feels better.neuro exam stable, will f/u her pcp    Leota Jacobsen, MD 04/14/14 1218

## 2014-05-01 DIAGNOSIS — M542 Cervicalgia: Secondary | ICD-10-CM | POA: Diagnosis not present

## 2014-05-01 DIAGNOSIS — M4712 Other spondylosis with myelopathy, cervical region: Secondary | ICD-10-CM | POA: Diagnosis not present

## 2014-05-20 DIAGNOSIS — M542 Cervicalgia: Secondary | ICD-10-CM | POA: Diagnosis not present

## 2014-05-30 DIAGNOSIS — Z961 Presence of intraocular lens: Secondary | ICD-10-CM | POA: Diagnosis not present

## 2014-06-10 DIAGNOSIS — Z23 Encounter for immunization: Secondary | ICD-10-CM | POA: Diagnosis not present

## 2014-06-16 DIAGNOSIS — M5030 Other cervical disc degeneration, unspecified cervical region: Secondary | ICD-10-CM | POA: Diagnosis not present

## 2014-06-16 DIAGNOSIS — Z8551 Personal history of malignant neoplasm of bladder: Secondary | ICD-10-CM | POA: Diagnosis not present

## 2014-06-16 DIAGNOSIS — C649 Malignant neoplasm of unspecified kidney, except renal pelvis: Secondary | ICD-10-CM | POA: Diagnosis not present

## 2014-06-24 DIAGNOSIS — C670 Malignant neoplasm of trigone of bladder: Secondary | ICD-10-CM | POA: Diagnosis not present

## 2014-06-24 DIAGNOSIS — C662 Malignant neoplasm of left ureter: Secondary | ICD-10-CM | POA: Diagnosis not present

## 2014-06-24 DIAGNOSIS — C679 Malignant neoplasm of bladder, unspecified: Secondary | ICD-10-CM | POA: Diagnosis not present

## 2014-06-24 DIAGNOSIS — K5730 Diverticulosis of large intestine without perforation or abscess without bleeding: Secondary | ICD-10-CM | POA: Diagnosis not present

## 2014-10-10 DIAGNOSIS — R011 Cardiac murmur, unspecified: Secondary | ICD-10-CM | POA: Diagnosis not present

## 2014-10-10 DIAGNOSIS — M8580 Other specified disorders of bone density and structure, unspecified site: Secondary | ICD-10-CM | POA: Diagnosis not present

## 2014-10-10 DIAGNOSIS — M150 Primary generalized (osteo)arthritis: Secondary | ICD-10-CM | POA: Diagnosis not present

## 2014-10-10 DIAGNOSIS — C679 Malignant neoplasm of bladder, unspecified: Secondary | ICD-10-CM | POA: Diagnosis not present

## 2014-10-10 DIAGNOSIS — I10 Essential (primary) hypertension: Secondary | ICD-10-CM | POA: Diagnosis not present

## 2014-10-10 DIAGNOSIS — R809 Proteinuria, unspecified: Secondary | ICD-10-CM | POA: Diagnosis not present

## 2014-10-10 DIAGNOSIS — Z0001 Encounter for general adult medical examination with abnormal findings: Secondary | ICD-10-CM | POA: Diagnosis not present

## 2014-10-10 DIAGNOSIS — L659 Nonscarring hair loss, unspecified: Secondary | ICD-10-CM | POA: Diagnosis not present

## 2014-11-18 DIAGNOSIS — E039 Hypothyroidism, unspecified: Secondary | ICD-10-CM | POA: Diagnosis not present

## 2014-11-26 DIAGNOSIS — M859 Disorder of bone density and structure, unspecified: Secondary | ICD-10-CM | POA: Diagnosis not present

## 2014-11-26 DIAGNOSIS — M8580 Other specified disorders of bone density and structure, unspecified site: Secondary | ICD-10-CM | POA: Diagnosis not present

## 2014-12-11 DIAGNOSIS — C649 Malignant neoplasm of unspecified kidney, except renal pelvis: Secondary | ICD-10-CM | POA: Diagnosis not present

## 2014-12-11 DIAGNOSIS — C670 Malignant neoplasm of trigone of bladder: Secondary | ICD-10-CM | POA: Diagnosis not present

## 2015-01-29 DIAGNOSIS — N3090 Cystitis, unspecified without hematuria: Secondary | ICD-10-CM | POA: Diagnosis not present

## 2015-01-29 DIAGNOSIS — R350 Frequency of micturition: Secondary | ICD-10-CM | POA: Diagnosis not present

## 2015-03-23 DIAGNOSIS — R350 Frequency of micturition: Secondary | ICD-10-CM | POA: Diagnosis not present

## 2015-04-15 DIAGNOSIS — Z1231 Encounter for screening mammogram for malignant neoplasm of breast: Secondary | ICD-10-CM | POA: Diagnosis not present

## 2015-04-15 DIAGNOSIS — Z803 Family history of malignant neoplasm of breast: Secondary | ICD-10-CM | POA: Diagnosis not present

## 2015-05-14 DIAGNOSIS — J069 Acute upper respiratory infection, unspecified: Secondary | ICD-10-CM | POA: Diagnosis not present

## 2015-05-14 DIAGNOSIS — R040 Epistaxis: Secondary | ICD-10-CM | POA: Diagnosis not present

## 2015-05-18 DIAGNOSIS — C649 Malignant neoplasm of unspecified kidney, except renal pelvis: Secondary | ICD-10-CM | POA: Diagnosis not present

## 2015-05-18 DIAGNOSIS — K5730 Diverticulosis of large intestine without perforation or abscess without bleeding: Secondary | ICD-10-CM | POA: Diagnosis not present

## 2015-05-18 DIAGNOSIS — C670 Malignant neoplasm of trigone of bladder: Secondary | ICD-10-CM | POA: Diagnosis not present

## 2015-05-21 DIAGNOSIS — N393 Stress incontinence (female) (male): Secondary | ICD-10-CM | POA: Diagnosis not present

## 2015-05-21 DIAGNOSIS — C670 Malignant neoplasm of trigone of bladder: Secondary | ICD-10-CM | POA: Diagnosis not present

## 2015-05-21 DIAGNOSIS — N815 Vaginal enterocele: Secondary | ICD-10-CM | POA: Diagnosis not present

## 2015-05-21 DIAGNOSIS — R351 Nocturia: Secondary | ICD-10-CM | POA: Diagnosis not present

## 2015-05-21 DIAGNOSIS — C649 Malignant neoplasm of unspecified kidney, except renal pelvis: Secondary | ICD-10-CM | POA: Diagnosis not present

## 2015-05-21 DIAGNOSIS — N8111 Cystocele, midline: Secondary | ICD-10-CM | POA: Diagnosis not present

## 2015-06-04 DIAGNOSIS — H35372 Puckering of macula, left eye: Secondary | ICD-10-CM | POA: Diagnosis not present

## 2015-06-04 DIAGNOSIS — Z961 Presence of intraocular lens: Secondary | ICD-10-CM | POA: Diagnosis not present

## 2015-06-04 DIAGNOSIS — H3531 Nonexudative age-related macular degeneration: Secondary | ICD-10-CM | POA: Diagnosis not present

## 2015-06-28 DIAGNOSIS — Z23 Encounter for immunization: Secondary | ICD-10-CM | POA: Diagnosis not present

## 2015-07-01 DIAGNOSIS — R040 Epistaxis: Secondary | ICD-10-CM | POA: Diagnosis not present

## 2015-07-07 DIAGNOSIS — R499 Unspecified voice and resonance disorder: Secondary | ICD-10-CM | POA: Diagnosis not present

## 2015-07-07 DIAGNOSIS — R040 Epistaxis: Secondary | ICD-10-CM | POA: Diagnosis not present

## 2015-07-14 DIAGNOSIS — J3489 Other specified disorders of nose and nasal sinuses: Secondary | ICD-10-CM | POA: Diagnosis not present

## 2015-07-14 DIAGNOSIS — R0981 Nasal congestion: Secondary | ICD-10-CM | POA: Diagnosis not present

## 2015-07-22 DIAGNOSIS — R490 Dysphonia: Secondary | ICD-10-CM | POA: Diagnosis not present

## 2015-07-22 DIAGNOSIS — J3089 Other allergic rhinitis: Secondary | ICD-10-CM | POA: Diagnosis not present

## 2015-07-22 DIAGNOSIS — R040 Epistaxis: Secondary | ICD-10-CM | POA: Diagnosis not present

## 2015-07-22 DIAGNOSIS — J3489 Other specified disorders of nose and nasal sinuses: Secondary | ICD-10-CM | POA: Diagnosis not present

## 2015-08-04 DIAGNOSIS — R499 Unspecified voice and resonance disorder: Secondary | ICD-10-CM | POA: Diagnosis not present

## 2015-10-26 DIAGNOSIS — R040 Epistaxis: Secondary | ICD-10-CM | POA: Diagnosis not present

## 2015-11-18 DIAGNOSIS — R011 Cardiac murmur, unspecified: Secondary | ICD-10-CM | POA: Diagnosis not present

## 2015-11-18 DIAGNOSIS — Z0000 Encounter for general adult medical examination without abnormal findings: Secondary | ICD-10-CM | POA: Diagnosis not present

## 2015-11-18 DIAGNOSIS — M8580 Other specified disorders of bone density and structure, unspecified site: Secondary | ICD-10-CM | POA: Diagnosis not present

## 2015-11-18 DIAGNOSIS — I10 Essential (primary) hypertension: Secondary | ICD-10-CM | POA: Diagnosis not present

## 2015-11-18 DIAGNOSIS — E039 Hypothyroidism, unspecified: Secondary | ICD-10-CM | POA: Diagnosis not present

## 2015-11-18 DIAGNOSIS — M150 Primary generalized (osteo)arthritis: Secondary | ICD-10-CM | POA: Diagnosis not present

## 2015-11-18 DIAGNOSIS — C679 Malignant neoplasm of bladder, unspecified: Secondary | ICD-10-CM | POA: Diagnosis not present

## 2016-01-04 DIAGNOSIS — R300 Dysuria: Secondary | ICD-10-CM | POA: Diagnosis not present

## 2016-01-04 DIAGNOSIS — R350 Frequency of micturition: Secondary | ICD-10-CM | POA: Diagnosis not present

## 2016-01-13 DIAGNOSIS — R300 Dysuria: Secondary | ICD-10-CM | POA: Diagnosis not present

## 2016-03-31 DIAGNOSIS — C649 Malignant neoplasm of unspecified kidney, except renal pelvis: Secondary | ICD-10-CM | POA: Diagnosis not present

## 2016-03-31 DIAGNOSIS — C670 Malignant neoplasm of trigone of bladder: Secondary | ICD-10-CM | POA: Diagnosis not present

## 2016-03-31 DIAGNOSIS — R351 Nocturia: Secondary | ICD-10-CM | POA: Diagnosis not present

## 2016-03-31 DIAGNOSIS — N8111 Cystocele, midline: Secondary | ICD-10-CM | POA: Diagnosis not present

## 2016-05-18 DIAGNOSIS — Z23 Encounter for immunization: Secondary | ICD-10-CM | POA: Diagnosis not present

## 2016-05-18 DIAGNOSIS — R601 Generalized edema: Secondary | ICD-10-CM | POA: Diagnosis not present

## 2016-05-18 DIAGNOSIS — R609 Edema, unspecified: Secondary | ICD-10-CM | POA: Diagnosis not present

## 2016-05-27 DIAGNOSIS — C670 Malignant neoplasm of trigone of bladder: Secondary | ICD-10-CM | POA: Diagnosis not present

## 2016-05-27 DIAGNOSIS — N281 Cyst of kidney, acquired: Secondary | ICD-10-CM | POA: Diagnosis not present

## 2016-06-02 DIAGNOSIS — N8111 Cystocele, midline: Secondary | ICD-10-CM | POA: Diagnosis not present

## 2016-06-02 DIAGNOSIS — C670 Malignant neoplasm of trigone of bladder: Secondary | ICD-10-CM | POA: Diagnosis not present

## 2016-06-02 DIAGNOSIS — N3941 Urge incontinence: Secondary | ICD-10-CM | POA: Diagnosis not present

## 2016-06-02 DIAGNOSIS — C649 Malignant neoplasm of unspecified kidney, except renal pelvis: Secondary | ICD-10-CM | POA: Diagnosis not present

## 2016-06-03 ENCOUNTER — Other Ambulatory Visit: Payer: Self-pay | Admitting: Urology

## 2016-06-14 ENCOUNTER — Encounter (HOSPITAL_BASED_OUTPATIENT_CLINIC_OR_DEPARTMENT_OTHER): Payer: Self-pay | Admitting: *Deleted

## 2016-06-14 NOTE — Progress Notes (Addendum)
NPO AFTER MN.  ARRIVE AT 0830.  NEEDS ISTAT 8 AND EKG.  WILL TAKE NORVASC AM DOS W/ SIPS OF WATER   ADDENDUM:  PT CALLED AND STATED SHE HAD ANOTHER MEDICATION TO ADD TO LIST.  UPDATED MED LIST W/ SYNTHROID 25MCG , TAKES IN AM.  PT VERBALIZED UNDERSTANDING TO TAKE AM DOS.

## 2016-06-17 ENCOUNTER — Ambulatory Visit (HOSPITAL_BASED_OUTPATIENT_CLINIC_OR_DEPARTMENT_OTHER): Payer: Medicare Other | Admitting: Anesthesiology

## 2016-06-17 ENCOUNTER — Encounter (HOSPITAL_COMMUNITY): Payer: Self-pay | Admitting: Emergency Medicine

## 2016-06-17 ENCOUNTER — Ambulatory Visit (HOSPITAL_BASED_OUTPATIENT_CLINIC_OR_DEPARTMENT_OTHER)
Admission: RE | Admit: 2016-06-17 | Discharge: 2016-06-17 | Disposition: A | Discharge status: Home / Self Care | Payer: Medicare Other | Source: Ambulatory Visit | Attending: Urology | Admitting: Urology

## 2016-06-17 ENCOUNTER — Encounter (HOSPITAL_BASED_OUTPATIENT_CLINIC_OR_DEPARTMENT_OTHER)
Admission: RE | Disposition: A | Discharge status: Home / Self Care | Payer: Self-pay | Source: Ambulatory Visit | Attending: Urology

## 2016-06-17 ENCOUNTER — Emergency Department (HOSPITAL_COMMUNITY)
Admission: EM | Admit: 2016-06-17 | Discharge: 2016-06-17 | Disposition: A | Discharge status: Home / Self Care | Payer: Medicare Other | Source: Home / Self Care

## 2016-06-17 ENCOUNTER — Encounter (HOSPITAL_BASED_OUTPATIENT_CLINIC_OR_DEPARTMENT_OTHER): Payer: Self-pay | Admitting: *Deleted

## 2016-06-17 ENCOUNTER — Other Ambulatory Visit: Payer: Self-pay

## 2016-06-17 DIAGNOSIS — N3020 Other chronic cystitis without hematuria: Secondary | ICD-10-CM | POA: Insufficient documentation

## 2016-06-17 DIAGNOSIS — Z5321 Procedure and treatment not carried out due to patient leaving prior to being seen by health care provider: Secondary | ICD-10-CM

## 2016-06-17 DIAGNOSIS — N3289 Other specified disorders of bladder: Secondary | ICD-10-CM | POA: Insufficient documentation

## 2016-06-17 DIAGNOSIS — Z8551 Personal history of malignant neoplasm of bladder: Secondary | ICD-10-CM | POA: Insufficient documentation

## 2016-06-17 DIAGNOSIS — I10 Essential (primary) hypertension: Secondary | ICD-10-CM | POA: Insufficient documentation

## 2016-06-17 DIAGNOSIS — Z8554 Personal history of malignant neoplasm of ureter: Secondary | ICD-10-CM | POA: Diagnosis not present

## 2016-06-17 DIAGNOSIS — M479 Spondylosis, unspecified: Secondary | ICD-10-CM | POA: Diagnosis not present

## 2016-06-17 DIAGNOSIS — Z08 Encounter for follow-up examination after completed treatment for malignant neoplasm: Secondary | ICD-10-CM | POA: Diagnosis present

## 2016-06-17 DIAGNOSIS — D494 Neoplasm of unspecified behavior of bladder: Secondary | ICD-10-CM | POA: Diagnosis not present

## 2016-06-17 DIAGNOSIS — N329 Bladder disorder, unspecified: Secondary | ICD-10-CM | POA: Diagnosis not present

## 2016-06-17 DIAGNOSIS — Z87891 Personal history of nicotine dependence: Secondary | ICD-10-CM | POA: Insufficient documentation

## 2016-06-17 DIAGNOSIS — R339 Retention of urine, unspecified: Secondary | ICD-10-CM

## 2016-06-17 HISTORY — PX: CYSTOSCOPY WITH BIOPSY: SHX5122

## 2016-06-17 HISTORY — DX: Personal history of colonic polyps: Z86.010

## 2016-06-17 HISTORY — DX: Personal history of adenomatous and serrated colon polyps: Z86.0101

## 2016-06-17 HISTORY — PX: CYSTOSCOPY W/ RETROGRADES: SHX1426

## 2016-06-17 HISTORY — DX: Personal history of malignant neoplasm of bladder: Z85.51

## 2016-06-17 HISTORY — DX: Personal history of malignant neoplasm of ureter: Z85.54

## 2016-06-17 HISTORY — DX: Other cervical disc degeneration, unspecified cervical region: M50.30

## 2016-06-17 LAB — POCT I-STAT, CHEM 8
BUN: 20 mg/dL (ref 6–20)
CHLORIDE: 100 mmol/L — AB (ref 101–111)
CREATININE: 0.8 mg/dL (ref 0.44–1.00)
Calcium, Ion: 1.2 mmol/L (ref 1.15–1.40)
GLUCOSE: 102 mg/dL — AB (ref 65–99)
HEMATOCRIT: 43 % (ref 36.0–46.0)
HEMOGLOBIN: 14.6 g/dL (ref 12.0–15.0)
POTASSIUM: 4.1 mmol/L (ref 3.5–5.1)
Sodium: 140 mmol/L (ref 135–145)
TCO2: 28 mmol/L (ref 0–100)

## 2016-06-17 SURGERY — CYSTOSCOPY, WITH BIOPSY
Anesthesia: General | Site: Renal

## 2016-06-17 MED ORDER — MEPERIDINE HCL 25 MG/ML IJ SOLN
6.2500 mg | INTRAMUSCULAR | Status: DC | PRN
  Filled 2016-06-17: qty 1

## 2016-06-17 MED ORDER — ONDANSETRON HCL 4 MG/2ML IJ SOLN
INTRAMUSCULAR | Status: AC
  Filled 2016-06-17: qty 2

## 2016-06-17 MED ORDER — PROPOFOL 10 MG/ML IV BOLUS
INTRAVENOUS | Status: AC
  Filled 2016-06-17: qty 20

## 2016-06-17 MED ORDER — ONDANSETRON HCL 4 MG/2ML IJ SOLN
INTRAMUSCULAR | Status: DC | PRN
  Administered 2016-06-17: 4 mg via INTRAVENOUS

## 2016-06-17 MED ORDER — EPHEDRINE SULFATE-NACL 50-0.9 MG/10ML-% IV SOSY
PREFILLED_SYRINGE | INTRAVENOUS | Status: DC | PRN
  Administered 2016-06-17 (×2): 5 mg via INTRAVENOUS

## 2016-06-17 MED ORDER — FENTANYL CITRATE (PF) 100 MCG/2ML IJ SOLN
25.0000 ug | INTRAMUSCULAR | Status: DC | PRN
  Filled 2016-06-17: qty 1

## 2016-06-17 MED ORDER — STERILE WATER FOR IRRIGATION IR SOLN
Status: DC | PRN
  Administered 2016-06-17: 3000 mL via INTRAVESICAL

## 2016-06-17 MED ORDER — LIDOCAINE 2% (20 MG/ML) 5 ML SYRINGE
INTRAMUSCULAR | Status: AC
  Filled 2016-06-17: qty 5

## 2016-06-17 MED ORDER — METOCLOPRAMIDE HCL 5 MG/ML IJ SOLN
10.0000 mg | Freq: Once | INTRAMUSCULAR | Status: DC | PRN
  Filled 2016-06-17: qty 2

## 2016-06-17 MED ORDER — LACTATED RINGERS IV SOLN
INTRAVENOUS | Status: DC
  Administered 2016-06-17: 09:00:00 via INTRAVENOUS
  Filled 2016-06-17: qty 1000

## 2016-06-17 MED ORDER — LIDOCAINE 2% (20 MG/ML) 5 ML SYRINGE
INTRAMUSCULAR | Status: DC | PRN
  Administered 2016-06-17: 60 mg via INTRAVENOUS

## 2016-06-17 MED ORDER — EPHEDRINE 5 MG/ML INJ
INTRAVENOUS | Status: AC
  Filled 2016-06-17: qty 10

## 2016-06-17 MED ORDER — DEXAMETHASONE SODIUM PHOSPHATE 4 MG/ML IJ SOLN
INTRAMUSCULAR | Status: DC | PRN
  Administered 2016-06-17: 10 mg via INTRAVENOUS

## 2016-06-17 MED ORDER — IOHEXOL 300 MG/ML  SOLN
INTRAMUSCULAR | Status: DC | PRN
  Administered 2016-06-17: 20 mL

## 2016-06-17 MED ORDER — TRAMADOL HCL 50 MG PO TABS: 50.0000 mg | ORAL_TABLET | Freq: Four times a day (QID) | ORAL | 0 refills | Status: DC | PRN

## 2016-06-17 MED ORDER — FENTANYL CITRATE (PF) 100 MCG/2ML IJ SOLN
INTRAMUSCULAR | Status: DC | PRN
  Administered 2016-06-17: 25 ug via INTRAVENOUS

## 2016-06-17 MED ORDER — GENTAMICIN SULFATE 40 MG/ML IJ SOLN
5.0000 mg/kg | Freq: Once | INTRAMUSCULAR | Status: AC
  Administered 2016-06-17: 320 mg via INTRAVENOUS
  Filled 2016-06-17 (×2): qty 8

## 2016-06-17 MED ORDER — GENTAMICIN IN SALINE 1.6-0.9 MG/ML-% IV SOLN
80.0000 mg | INTRAVENOUS | Status: DC
  Filled 2016-06-17: qty 50

## 2016-06-17 MED ORDER — DEXAMETHASONE SODIUM PHOSPHATE 10 MG/ML IJ SOLN
INTRAMUSCULAR | Status: AC
  Filled 2016-06-17: qty 1

## 2016-06-17 MED ORDER — PROPOFOL 10 MG/ML IV BOLUS
INTRAVENOUS | Status: DC | PRN
  Administered 2016-06-17: 100 mg via INTRAVENOUS

## 2016-06-17 MED ORDER — FENTANYL CITRATE (PF) 100 MCG/2ML IJ SOLN
INTRAMUSCULAR | Status: AC
  Filled 2016-06-17: qty 2

## 2016-06-17 SURGICAL SUPPLY — 14 items
BAG DRAIN URO-CYSTO SKYTR STRL (DRAIN) ×4 IMPLANT
BAG DRN UROCATH (DRAIN) ×2
CATH INTERMIT  6FR 70CM (CATHETERS) ×2 IMPLANT
CLOTH BEACON ORANGE TIMEOUT ST (SAFETY) ×4 IMPLANT
ELECT REM PT RETURN 9FT ADLT (ELECTROSURGICAL) ×4
ELECTRODE REM PT RTRN 9FT ADLT (ELECTROSURGICAL) ×2 IMPLANT
GLOVE BIO SURGEON STRL SZ7.5 (GLOVE) ×4 IMPLANT
GOWN STRL REUS W/ TWL LRG LVL3 (GOWN DISPOSABLE) ×6 IMPLANT
GOWN STRL REUS W/TWL LRG LVL3 (GOWN DISPOSABLE) ×8
GUIDEWIRE STR DUAL SENSOR (WIRE) ×4 IMPLANT
KIT ROOM TURNOVER WOR (KITS) ×4 IMPLANT
MANIFOLD NEPTUNE II (INSTRUMENTS) ×4 IMPLANT
PACK CYSTO (CUSTOM PROCEDURE TRAY) ×4 IMPLANT
WATER STERILE IRR 3000ML UROMA (IV SOLUTION) ×4 IMPLANT

## 2016-06-17 NOTE — Brief Op Note (Signed)
06/17/2016  10:53 AM  PATIENT:  Rebecca Norman  82 y.o. female  PRE-OPERATIVE DIAGNOSIS:  HISTORY OF BLADDER / URETERAL CANCER  POST-OPERATIVE DIAGNOSIS:  HISTORY OF BLADDER / URETERAL CANCER  PROCEDURE:  Procedure(s): CYSTOSCOPY WITH BIOPSY AND FULGERATION (N/A) CYSTOSCOPY WITH RETROGRADE PYELOGRAM (Bilateral)  SURGEON:  Surgeon(s) and Role:    * Alexis Frock, MD - Primary  PHYSICIAN ASSISTANT:   ASSISTANTS: none   ANESTHESIA:   general  EBL:  Total I/O In: 200 [I.V.:200] Out: 0   BLOOD ADMINISTERED:none  DRAINS: none   LOCAL MEDICATIONS USED:  NONE  SPECIMEN:  Source of Specimen:  Rt, Lt, Posterior, Trigone bladder biopsies  DISPOSITION OF SPECIMEN:  PATHOLOGY  COUNTS:  YES  TOURNIQUET:  * No tourniquets in log *  DICTATION: .Other Dictation: Dictation Number [id number]  PLAN OF CARE: Discharge to home after PACU  PATIENT DISPOSITION:  PACU - hemodynamically stable.   Delay start of Pharmacological VTE agent (>24hrs) due to surgical blood loss or risk of bleeding: yes

## 2016-06-17 NOTE — ED Triage Notes (Signed)
Pt. reports urinary retention " can't pee" with vaginal pain/discomfort , denies dysuria or hematuria , pt. had a cystoscopy and bladder biopsy this morning , denies fever or chills.

## 2016-06-17 NOTE — H&P (Signed)
Rebecca Norman is an 82 y.o. female.    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 Jasmine December.   Abbreviated Course:   12/2007 TURBT T1G3 -->Epaziquone vs placebo trial.  2011 - cysto biopsy NED  2013 - retrogrades negative  12/2013 Cysto NED; 10 2015 Cysto + CT Urogram NED  12/2014 Cysto NED; 05/2015 Cysto + CT Urogram NED  05/2016 Cysto + CT Urogram ? bladder carcinoma in situ.    PMH sig for osteoarthritis, back surgery. No CV disease. No strong blood thinners. Her PCP is [name] MD.   Today Aizlee is seen to proceed with operative cysto / bladder biopsy / retrogrades to rule out recurrent bladder canceer as most recent office cysto concerning. She has been on CX-specific Levaquin course pre-op for pseudomonas bacteruria noted last office visit. NO interval fevers.     Past Medical History:  Diagnosis Date  . DDD (degenerative disc disease), cervical    severe/  auto fusion c2-c5  . Diverticulosis   . Hemorrhoids   . History of adenomatous polyp of colon    1995  adenomatous polyp/  1997 & 2003  hyperplastic polyp's  . History of bladder cancer urologist-  dr [name]   s/p  TURBT's  . History of cancer of ureter   . Hypertension   . Nocturia   . Osteoarthritis     Past Surgical History:  Procedure Laterality Date  . CATARACT EXTRACTION W/ INTRAOCULAR LENS  IMPLANT, BILATERAL Bilateral   . CYSTOSCOPY W/ RETROGRADES  10/03/2011   Procedure: CYSTOSCOPY WITH RETROGRADE PYELOGRAM;  Surgeon: Molli Hazard, MD;  Location: [facility];  Service: Urology;;  . Sheldon Silvan  2009   L3 - 4  . TRANSURETHRAL RESECTION OF BLADDER  x3 --  2009; 2010; 06-29-2010  . TRANSURETHRAL RESECTION OF BLADDER TUMOR  10/03/2011   Procedure: TRANSURETHRAL RESECTION OF BLADDER TUMOR (TURBT);  Surgeon: Molli Hazard, MD;  Location[facility];  Service: Urology;  Laterality: N/A;    Family History  Problem Relation Age of Onset  . Breast cancer Sister   . Stomach cancer Sister   . Colon cancer Neg Hx    Social History:  reports that she quit smoking about 10 years ago. Her smoking use included Cigarettes. She quit after 10.00 years of use. She has never used smokeless tobacco. She reports that she drinks about 1.8 oz of alcohol per week . She reports that she does not use drugs.  Allergies:  Allergies  Allergen Reactions  . Flagyl [Metronidazole] Hives    No prescriptions prior to admission.    No results found for this or any previous visit (from the past 48 hour(s)). No results found.  Review of Systems  Constitutional: Negative.  Negative for chills, fever, malaise/fatigue and weight loss.  HENT: Negative.   Eyes: Negative.   Respiratory: Negative.   Cardiovascular: Negative.   Gastrointestinal: Negative.   Genitourinary: Positive for urgency.  Musculoskeletal: Negative.   Skin: Negative.   Neurological: Negative.   Endo/Heme/Allergies: Negative.   Psychiatric/Behavioral: Negative.     Height 5\' 4"  (1.626 m), weight 74.8 kg (165 lb). Physical Exam  Constitutional: She is oriented to person, place, and time. She appears well-developed.  Elderly with some frailty, stable.   HENT:  Head: Normocephalic.  Eyes: Pupils are equal, round, and reactive to light.  Neck: Normal range of motion.  Cardiovascular: Normal rate.   Respiratory: Effort normal.  GI: Soft.  Genitourinary:  Genitourinary Comments: NO CVAT.   Neurological: She is alert and oriented to person, place, and time.  Skin: Skin is warm.  Psychiatric: She has a normal mood and affect.     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 Risks, benefits, alternatives, expected peri-op  course discussed previously and reiterated today.   Alexis Frock, MD 06/17/2016, 6:34 AM

## 2016-06-17 NOTE — Transfer of Care (Signed)
Immediate Anesthesia Transfer of Care Note  Patient: Rebecca Norman  Procedure(s) Performed: Procedure(s) (LRB): CYSTOSCOPY WITH BIOPSY AND FULGERATION (N/A) CYSTOSCOPY WITH RETROGRADE PYELOGRAM (Bilateral)  Patient Location: PACU  Anesthesia Type: General  Level of Consciousness: awake, oriented, sedated and patient cooperative  Airway & Oxygen Therapy: Patient Spontanous Breathing and Patient connected to face mask oxygen  Post-op Assessment: Report given to PACU RN and Post -op Vital signs reviewed and stable  Post vital signs: Reviewed and stable  Complications: No apparent anesthesia complications

## 2016-06-17 NOTE — Anesthesia Postprocedure Evaluation (Signed)
Anesthesia Post Note  Patient: BREIANA ECKHARDT  Procedure(s) Performed: Procedure(s) (LRB): CYSTOSCOPY WITH BIOPSY AND FULGERATION (N/A) CYSTOSCOPY WITH RETROGRADE PYELOGRAM (Bilateral)  Patient location during evaluation: PACU Anesthesia Type: General Level of consciousness: awake and alert Pain management: pain level controlled Vital Signs Assessment: post-procedure vital signs reviewed and stable Respiratory status: spontaneous breathing, nonlabored ventilation, respiratory function stable and patient connected to nasal cannula oxygen Cardiovascular status: blood pressure returned to baseline and stable Postop Assessment: no signs of nausea or vomiting Anesthetic complications: no    Last Vitals:  Vitals:   06/17/16 1130 06/17/16 1211  BP: (!) 129/54 (!) 127/45  Pulse: 72 78  Resp: (!) 23 (!) 24  Temp:  36.6 C    Last Pain:  Vitals:   06/17/16 1211  TempSrc: Oral                 Montez Hageman

## 2016-06-17 NOTE — ED Notes (Signed)
Pt daughter requesting to leave since pt has since urinated. Pt's daughter agreed to bladder scan the pt prior to leaving

## 2016-06-17 NOTE — Discharge Instructions (Signed)
°  Post Anesthesia Home Care Instructions  Activity: Get plenty of rest for the remainder of the day. A responsible adult should stay with you for 24 hours following the procedure.  For the next 24 hours, DO NOT: -Drive a car -Paediatric nurse -Drink alcoholic beverages -Take any medication unless instructed by your physician -Make any legal decisions or sign important papers.  Meals: Start with liquid foods such as gelatin or soup. Progress to regular foods as tolerated. Avoid greasy, spicy, heavy foods. If nausea and/or vomiting occur, drink only clear liquids until the nausea and/or vomiting subsides. Call your physician if vomiting continues.  Special Instructions/Symptoms: Your throat may feel dry or sore from the anesthesia or the breathing tube placed in your throat during surgery. If this causes discomfort, gargle with warm salt water. The discomfort should disappear within 24 hours.  If you had a scopolamine patch placed behind your ear for the management of post- operative nausea and/or vomiting:  1. The medication in the patch is effective for 72 hours, after which it should be removed.  Wrap patch in a tissue and discard in the trash. Wash hands thoroughly with soap and water. 2. You may remove the patch earlier than 72 hours if you experience unpleasant side effects which may include dry mouth, dizziness or visual disturbances. 3. Avoid touching the patch. Wash your hands with soap and water after contact with the patch.   CYSTOSCOPY HOME CARE INSTRUCTIONS  Activity: Rest for the remainder of the day.  Do not drive or operate equipment today.  You may resume normal activities in one to two days as instructed by your physician.   Meals: Drink plenty of liquids and eat light foods such as gelatin or soup this evening.  You may return to a normal meal plan tomorrow.  Return to Work: You may return to work in one to two days or as instructed by your physician.  Special  Instructions / Symptoms: Call your physician if any of these symptoms occur:   -persistent or heavy bleeding  -bleeding which continues after first few urination  -large blood clots that are difficult to pass  -urine stream diminishes or stops completely  -fever equal to or higher than 101 degrees Farenheit.  -cloudy urine with a strong, foul odor  -severe pain  Females should always wipe from front to back after elimination.  You may feel some burning pain when you urinate.  This should disappear with time.  Applying moist heat to the lower abdomen or a hot tub bath may help relieve the pain. \  Follow-Up / Date of Return Visit to Your Physician:  Call for an appointment to arrange follow-up.   1 - You may have urinary urgency (bladder spasms) and bloody urine on / off  X few days. This is normal.  2 - Call MD or go to ER for fever >102, severe pain / nausea / vomiting not relieved by medications, or acute change in medical status

## 2016-06-17 NOTE — Anesthesia Procedure Notes (Signed)
Procedure Name: LMA Insertion Date/Time: 06/17/2016 10:28 AM Performed by: Denna Haggard D Pre-anesthesia Checklist: Patient identified, Emergency Drugs available, Suction available and Patient being monitored Patient Re-evaluated:Patient Re-evaluated prior to inductionOxygen Delivery Method: Circle system utilized Preoxygenation: Pre-oxygenation with 100% oxygen Intubation Type: IV induction Ventilation: Mask ventilation without difficulty LMA: LMA inserted LMA Size: 4.0 Number of attempts: 1 Airway Equipment and Method: Bite block Placement Confirmation: positive ETCO2 Tube secured with: Tape Dental Injury: Teeth and Oropharynx as per pre-operative assessment

## 2016-06-17 NOTE — Anesthesia Preprocedure Evaluation (Signed)
Anesthesia Evaluation  Patient identified by MRN, date of birth, ID band Patient awake    Reviewed: Allergy & Precautions, H&P , NPO status , Patient's Chart, lab work & pertinent test results  History of Anesthesia Complications Negative for: history of anesthetic complications  Airway Mallampati: II  TM Distance: >3 FB Neck ROM: Full    Dental no notable dental hx. (+) Edentulous Upper, Partial Lower, Upper Dentures, Poor Dentition, Dental Advisory Given   Pulmonary former smoker,    Pulmonary exam normal breath sounds clear to auscultation       Cardiovascular hypertension, Pt. on medications Normal cardiovascular exam Rhythm:Regular Rate:Normal     Neuro/Psych negative neurological ROS  negative psych ROS   GI/Hepatic negative GI ROS, Neg liver ROS,   Endo/Other  negative endocrine ROS  Renal/GU negative Renal ROS   Neoplasm bladder     Musculoskeletal  (+) Arthritis , Osteoarthritis,    Abdominal   Peds negative pediatric ROS (+)  Hematology negative hematology ROS (+)   Anesthesia Other Findings   Reproductive/Obstetrics negative OB ROS                             Anesthesia Physical  Anesthesia Plan  ASA: II  Anesthesia Plan: General   Post-op Pain Management:    Induction: Intravenous  Airway Management Planned: LMA  Additional Equipment:   Intra-op Plan:   Post-operative Plan:   Informed Consent: I have reviewed the patients History and Physical, chart, labs and discussed the procedure including the risks, benefits and alternatives for the proposed anesthesia with the patient or authorized representative who has indicated his/her understanding and acceptance.   Dental advisory given  Plan Discussed with: CRNA  Anesthesia Plan Comments:         Anesthesia Quick Evaluation

## 2016-06-18 NOTE — Op Note (Deleted)
  The note originally documented on this encounter has been moved the the encounter in which it belongs.  

## 2016-06-18 NOTE — Op Note (Signed)
Rebecca Norman, Rebecca Norman               ACCOUNT NO.:  [id number]  MEDICAL RECORD NO.:  [id number]  LOCATION:                               FACILITY:  [facility]  PHYSICIAN:  Alexis Frock, MD     DATE OF BIRTH:  [birth year]  DATE OF PROCEDURE: 06/17/2016                               OPERATIVE REPORT   DIAGNOSES:  Bladder erythema, history of recurrent bladder cancer.  PROCEDURES: 1. Cystoscopy with bilateral retrograde pyelograms and interpretation. 2. Bladder biopsy with fulguration.  ESTIMATED BLOOD LOSS:  Nil.  COMPLICATION:  None.  SPECIMENS:  Bladder biopsies; right wall, left wall, posterior, trigone, all for permanent pathology.  Left wall appears to be old resection site.  FINDINGS: 1. Improved bladder erythema, most residual erythema at the trigone     area. 2. Unremarkable bilateral pyelograms.  INDICATION:  Rebecca Norman is a fairly vigorous 82 year old lady with history of high-grade distal left ureteral cancer and bladder cancer, status post therapy several years ago.  She has been on surveillance protocol, inferiorly imaging and cystoscopy.  After most recent office cystoscopy, she was noted to have significant bladder erythema and inflammatory changes worrisome for possible recurrent carcinoma in situ. She did have bacteriuria at the time, which has now been treated given her history.  This was still highly concerning for possible bladder tumor recurrence.  Options were discussed including operative biopsies. She wished to proceed.  Informed consent was obtained and placed in the medical record.  PROCEDURE IN DETAIL:  The patient being Rebecca Norman, was verified. Procedure being cysto, bilateral retrogrades and bladder biopsy was confirmed.  Procedure was carried out.  Time-out was performed. Intravenous antibiotics were administered.  General LMA anesthesia was introduced.  The patient was placed into a low lithotomy position and sterile field was created by  prepping and draping the patient's vagina, introitus and proximal thighs using iodine x3.  Next, cystourethroscopy was performed using a 23-French rigid cystoscope with offset lens. Inspection of the urinary bladder revealed somewhat improved bladder erythema, most residual erythema at the inter-trigone area and bilateral ureteral orifices were Nelda Marseille.  There was an old resection site noted on the left wall just lateral to the left ureteral orifice.  Attention was then directed at retrograde pyelography.  The right ureteral orifice was cannulated with a 6-French end-hole catheter and right retrograde pyelogram was obtained.  Right retrograde pyelogram demonstrated a single right ureter with single-system right kidney.  No filling defects or narrowing noted. Similarly, left retrograde pyelogram was obtained.  Left retrograde pyelogram demonstrated a single left ureter with single- system left kidney.  No filling defects or narrowing noted.  Next, the cold cup biopsy forceps were used to obtain representative seromuscular bladder biopsies from the area of trigone erythema, posterior wall, right wall and left wall, taking exquisite care to avoid bladder perforation, which did not occur grossly.  These separate specimens were set aside for permanent pathology, labeled as such.  Next, a Bugbee electrode was used to apply coagulation current to each of these biopsy sites, which resulted in excellent hemostasis.  Following these maneuvers, there was good hemostasis, no evidence for bladder perforation.  There were visible patency of  bilateral ureteral orifices. Bladder was emptied per cystoscope.  Procedure was then terminated.  The patient tolerated the procedure well.  There were no immediate periprocedural complications.  The patient was taken to the postanesthesia care unit in stable condition.    ______________________________ Alexis Frock,  MD   ______________________________ Alexis Frock, MD    TM/MEDQ  D:  06/17/2016  T:  06/18/2016  Job:  [id number]

## 2016-06-20 ENCOUNTER — Encounter (HOSPITAL_BASED_OUTPATIENT_CLINIC_OR_DEPARTMENT_OTHER): Payer: Self-pay | Admitting: Urology

## 2016-07-01 DIAGNOSIS — N8111 Cystocele, midline: Secondary | ICD-10-CM | POA: Diagnosis not present

## 2016-07-01 DIAGNOSIS — N3941 Urge incontinence: Secondary | ICD-10-CM | POA: Diagnosis not present

## 2016-07-01 DIAGNOSIS — C670 Malignant neoplasm of trigone of bladder: Secondary | ICD-10-CM | POA: Diagnosis not present

## 2016-07-18 DIAGNOSIS — N8111 Cystocele, midline: Secondary | ICD-10-CM | POA: Diagnosis not present

## 2016-07-18 DIAGNOSIS — R351 Nocturia: Secondary | ICD-10-CM | POA: Diagnosis not present

## 2016-07-18 DIAGNOSIS — Z8551 Personal history of malignant neoplasm of bladder: Secondary | ICD-10-CM | POA: Diagnosis not present

## 2016-08-10 DIAGNOSIS — Z961 Presence of intraocular lens: Secondary | ICD-10-CM | POA: Diagnosis not present

## 2016-08-10 DIAGNOSIS — H353131 Nonexudative age-related macular degeneration, bilateral, early dry stage: Secondary | ICD-10-CM | POA: Diagnosis not present

## 2016-08-10 DIAGNOSIS — H35372 Puckering of macula, left eye: Secondary | ICD-10-CM | POA: Diagnosis not present

## 2016-09-19 DIAGNOSIS — N3941 Urge incontinence: Secondary | ICD-10-CM | POA: Diagnosis not present

## 2016-09-19 DIAGNOSIS — C670 Malignant neoplasm of trigone of bladder: Secondary | ICD-10-CM | POA: Diagnosis not present

## 2016-09-19 DIAGNOSIS — N8111 Cystocele, midline: Secondary | ICD-10-CM | POA: Diagnosis not present

## 2016-11-30 ENCOUNTER — Ambulatory Visit (INDEPENDENT_AMBULATORY_CARE_PROVIDER_SITE_OTHER): Payer: Medicare Other | Admitting: Orthopedic Surgery

## 2016-11-30 ENCOUNTER — Encounter (INDEPENDENT_AMBULATORY_CARE_PROVIDER_SITE_OTHER): Payer: Self-pay | Admitting: Family

## 2016-11-30 ENCOUNTER — Ambulatory Visit (INDEPENDENT_AMBULATORY_CARE_PROVIDER_SITE_OTHER): Payer: Medicare Other

## 2016-11-30 ENCOUNTER — Ambulatory Visit (INDEPENDENT_AMBULATORY_CARE_PROVIDER_SITE_OTHER): Payer: Medicare Other | Admitting: Family

## 2016-11-30 DIAGNOSIS — M25571 Pain in right ankle and joints of right foot: Secondary | ICD-10-CM

## 2016-11-30 NOTE — Progress Notes (Signed)
Office Visit Note   Patient: Rebecca Norman           Date of Birth: 04/19/1925           MRN: [id number] Visit Date: 11/30/2016              Requested by: [name], MD [address] [place], [place] [address] PCP: [name], MD  Chief Complaint  Patient presents with  . Right Ankle - Pain      HPI: The patient is an 82 year old woman who is seen today with a 2 to three-week history of right ankle pain. She's had no known injury. Denies any swelling. Points to the lateral ankle is most painful area. Does report a history of medial ankle bursitis. States her pain is made worse by ambulation. No pain at rest. Has been taking 400 mg of ibuprofen in the morning which has been helping pretty well with her pain. Does ambulate with a cane for stability.  Assessment & Plan: Visit Diagnoses:  1. Pain in right ankle and joints of right foot     Plan: She states that she has a wrap up ankle brace at home. Recommended she wear this for the next couple weeks. Recommended she do work on range of motion of the ankle. Continue using ibuprofen as needed. May use ice.  Follow-Up Instructions: Return in about 3 weeks (around 12/21/2016), or if symptoms worsen or fail to improve.   Right Ankle Exam  Swelling: none  Tenderness  The patient is experiencing tenderness in the ATF.    Range of Motion  The patient has normal right ankle ROM.  Muscle Strength  The patient has normal right ankle strength.  Tests  Anterior drawer: negative Varus tilt: negative  Other  Erythema: absent Sensation: normal       Patient is alert, oriented, no adenopathy, well-dressed, normal affect, normal respiratory effort.  Imaging: Xr Ankle Complete Right  Result Date: 11/30/2016 Radiographs of the right ankle are negative for fracture. Acute finding. Mortise is congruent.   Labs: Lab Results  Component Value Date   REPTSTATUS 05/30/2008 FINAL 05/28/2008   CULT   05/28/2008    Multiple bacterial morphotypes present, none predominant. Suggest appropriate recollection if clinically indicated.    Orders:  Orders Placed This Encounter  Procedures  . XR Ankle Complete Right   No orders of the defined types were placed in this encounter.    Procedures: No procedures performed  Clinical Data: No additional findings.  ROS:  All other systems negative, except as noted in the HPI. Review of Systems  Constitutional: Negative for chills and fever.  Musculoskeletal: Positive for arthralgias and myalgias. Negative for joint swelling.    Objective: Vital Signs: There were no vitals taken for this visit.  Specialty Comments:  No specialty comments available.  PMFS History: Patient Active Problem List   Diagnosis Date Noted  . S/P laminectomy 07/10/2013  . HTN (hypertension) 07/10/2013  . BLADDER CANCER 12/17/2008  . DIVERTICULOSIS OF COLON 12/12/2008  . COLONIC POLYPS, HYPERPLASTIC, HX OF 12/12/2008   Past Medical History:  Diagnosis Date  . DDD (degenerative disc disease), cervical    severe/  auto fusion c2-c5  . Diverticulosis   . Hemorrhoids   . History of adenomatous polyp of colon    1995  adenomatous polyp/  1997 & 2003  hyperplastic polyp's  . History of bladder cancer urologist-  dr [name]   s/p  TURBT's  . History  of cancer of ureter   . Hypertension   . Nocturia   . Osteoarthritis     Family History  Problem Relation Age of Onset  . Breast cancer Sister   . Stomach cancer Sister   . Colon cancer Neg Hx     Past Surgical History:  Procedure Laterality Date  . CATARACT EXTRACTION W/ INTRAOCULAR LENS  IMPLANT, BILATERAL Bilateral   . CYSTOSCOPY W/ RETROGRADES  10/03/2011   Procedure: CYSTOSCOPY WITH RETROGRADE PYELOGRAM;  Surgeon: Molli Hazard, MD;  Location: [facility];  Service: Urology;;  . Consuela Mimes W/ RETROGRADES Bilateral 06/17/2016   Procedure: CYSTOSCOPY WITH RETROGRADE PYELOGRAM;   Surgeon: Alexis Frock, MD;  Location: [facility];  Service: Urology;  Laterality: Bilateral;  . CYSTOSCOPY WITH BIOPSY N/A 06/17/2016   Procedure: CYSTOSCOPY WITH BIOPSY AND FULGERATION;  Surgeon: Alexis Frock, MD;  Location: [facility];  Service: Urology;  Laterality: N/A;  . LAMINECTOMY  2009   L3 - 4  . TRANSURETHRAL RESECTION OF BLADDER  x3 --  2009; 2010; 06-29-2010  . TRANSURETHRAL RESECTION OF BLADDER TUMOR  10/03/2011   Procedure: TRANSURETHRAL RESECTION OF BLADDER TUMOR (TURBT);  Surgeon: Molli Hazard, MD;  Location: [facility];  Service: Urology;  Laterality: N/A;   Social History   Occupational History  . Retired    Social History Main Topics  . Smoking status: Former Smoker    Years: 10.00    Types: Cigarettes    Quit date: 09/25/2005  . Smokeless tobacco: Never Used  . Alcohol use Yes  . Drug use: No  . Sexual activity: Not on file

## 2017-01-05 ENCOUNTER — Ambulatory Visit (INDEPENDENT_AMBULATORY_CARE_PROVIDER_SITE_OTHER): Payer: PRIVATE HEALTH INSURANCE | Admitting: Orthopedic Surgery

## 2017-01-11 DIAGNOSIS — C679 Malignant neoplasm of bladder, unspecified: Secondary | ICD-10-CM | POA: Diagnosis not present

## 2017-01-11 DIAGNOSIS — M150 Primary generalized (osteo)arthritis: Secondary | ICD-10-CM | POA: Diagnosis not present

## 2017-01-11 DIAGNOSIS — Z0000 Encounter for general adult medical examination without abnormal findings: Secondary | ICD-10-CM | POA: Diagnosis not present

## 2017-01-11 DIAGNOSIS — M8580 Other specified disorders of bone density and structure, unspecified site: Secondary | ICD-10-CM | POA: Diagnosis not present

## 2017-01-11 DIAGNOSIS — J309 Allergic rhinitis, unspecified: Secondary | ICD-10-CM | POA: Diagnosis not present

## 2017-01-11 DIAGNOSIS — R809 Proteinuria, unspecified: Secondary | ICD-10-CM | POA: Diagnosis not present

## 2017-01-11 DIAGNOSIS — E039 Hypothyroidism, unspecified: Secondary | ICD-10-CM | POA: Diagnosis not present

## 2017-01-11 DIAGNOSIS — I10 Essential (primary) hypertension: Secondary | ICD-10-CM | POA: Diagnosis not present

## 2017-01-12 DIAGNOSIS — M7751 Other enthesopathy of right foot: Secondary | ICD-10-CM | POA: Diagnosis not present

## 2017-01-12 DIAGNOSIS — M6281 Muscle weakness (generalized): Secondary | ICD-10-CM | POA: Diagnosis not present

## 2017-01-12 DIAGNOSIS — G43809 Other migraine, not intractable, without status migrainosus: Secondary | ICD-10-CM | POA: Diagnosis not present

## 2017-01-12 DIAGNOSIS — M150 Primary generalized (osteo)arthritis: Secondary | ICD-10-CM | POA: Diagnosis not present

## 2017-01-12 DIAGNOSIS — I10 Essential (primary) hypertension: Secondary | ICD-10-CM | POA: Diagnosis not present

## 2017-01-12 DIAGNOSIS — M47812 Spondylosis without myelopathy or radiculopathy, cervical region: Secondary | ICD-10-CM | POA: Diagnosis not present

## 2017-01-18 ENCOUNTER — Telehealth (INDEPENDENT_AMBULATORY_CARE_PROVIDER_SITE_OTHER): Payer: Self-pay | Admitting: Orthopedic Surgery

## 2017-01-18 DIAGNOSIS — M7751 Other enthesopathy of right foot: Secondary | ICD-10-CM | POA: Diagnosis not present

## 2017-01-18 DIAGNOSIS — M47812 Spondylosis without myelopathy or radiculopathy, cervical region: Secondary | ICD-10-CM | POA: Diagnosis not present

## 2017-01-18 DIAGNOSIS — M150 Primary generalized (osteo)arthritis: Secondary | ICD-10-CM | POA: Diagnosis not present

## 2017-01-18 DIAGNOSIS — M6281 Muscle weakness (generalized): Secondary | ICD-10-CM | POA: Diagnosis not present

## 2017-01-18 DIAGNOSIS — I10 Essential (primary) hypertension: Secondary | ICD-10-CM | POA: Diagnosis not present

## 2017-01-18 DIAGNOSIS — G43809 Other migraine, not intractable, without status migrainosus: Secondary | ICD-10-CM | POA: Diagnosis not present

## 2017-01-18 NOTE — Telephone Encounter (Signed)
Cherise @ [facility] request a call back with dx.

## 2017-01-19 DIAGNOSIS — M6281 Muscle weakness (generalized): Secondary | ICD-10-CM | POA: Diagnosis not present

## 2017-01-19 DIAGNOSIS — M47812 Spondylosis without myelopathy or radiculopathy, cervical region: Secondary | ICD-10-CM | POA: Diagnosis not present

## 2017-01-19 DIAGNOSIS — I10 Essential (primary) hypertension: Secondary | ICD-10-CM | POA: Diagnosis not present

## 2017-01-19 DIAGNOSIS — M150 Primary generalized (osteo)arthritis: Secondary | ICD-10-CM | POA: Diagnosis not present

## 2017-01-19 DIAGNOSIS — M7751 Other enthesopathy of right foot: Secondary | ICD-10-CM | POA: Diagnosis not present

## 2017-01-19 DIAGNOSIS — G43809 Other migraine, not intractable, without status migrainosus: Secondary | ICD-10-CM | POA: Diagnosis not present

## 2017-01-19 NOTE — Telephone Encounter (Signed)
I called and spoke with Rebecca Norman and advised her patients xrays were negative for fracture. Was advised to work on ROM, and continue with ankle bracing. Office notes faxed [phone].

## 2017-01-24 DIAGNOSIS — G43809 Other migraine, not intractable, without status migrainosus: Secondary | ICD-10-CM | POA: Diagnosis not present

## 2017-01-24 DIAGNOSIS — M7751 Other enthesopathy of right foot: Secondary | ICD-10-CM | POA: Diagnosis not present

## 2017-01-24 DIAGNOSIS — M6281 Muscle weakness (generalized): Secondary | ICD-10-CM | POA: Diagnosis not present

## 2017-01-24 DIAGNOSIS — M150 Primary generalized (osteo)arthritis: Secondary | ICD-10-CM | POA: Diagnosis not present

## 2017-01-24 DIAGNOSIS — M47812 Spondylosis without myelopathy or radiculopathy, cervical region: Secondary | ICD-10-CM | POA: Diagnosis not present

## 2017-01-24 DIAGNOSIS — I10 Essential (primary) hypertension: Secondary | ICD-10-CM | POA: Diagnosis not present

## 2017-01-26 DIAGNOSIS — M7751 Other enthesopathy of right foot: Secondary | ICD-10-CM | POA: Diagnosis not present

## 2017-01-26 DIAGNOSIS — M6281 Muscle weakness (generalized): Secondary | ICD-10-CM | POA: Diagnosis not present

## 2017-01-26 DIAGNOSIS — M47812 Spondylosis without myelopathy or radiculopathy, cervical region: Secondary | ICD-10-CM | POA: Diagnosis not present

## 2017-01-26 DIAGNOSIS — G43809 Other migraine, not intractable, without status migrainosus: Secondary | ICD-10-CM | POA: Diagnosis not present

## 2017-01-26 DIAGNOSIS — I10 Essential (primary) hypertension: Secondary | ICD-10-CM | POA: Diagnosis not present

## 2017-01-26 DIAGNOSIS — M150 Primary generalized (osteo)arthritis: Secondary | ICD-10-CM | POA: Diagnosis not present

## 2017-02-02 ENCOUNTER — Encounter (INDEPENDENT_AMBULATORY_CARE_PROVIDER_SITE_OTHER): Payer: Self-pay | Admitting: Orthopedic Surgery

## 2017-02-02 ENCOUNTER — Ambulatory Visit (INDEPENDENT_AMBULATORY_CARE_PROVIDER_SITE_OTHER): Payer: Medicare Other | Admitting: Orthopedic Surgery

## 2017-02-02 VITALS — Ht 64.0 in | Wt 172.0 lb

## 2017-02-02 DIAGNOSIS — M25571 Pain in right ankle and joints of right foot: Secondary | ICD-10-CM

## 2017-02-02 MED ORDER — LIDOCAINE HCL 1 % IJ SOLN
2.0000 mL | INTRAMUSCULAR | Status: AC | PRN
  Administered 2017-02-02: 2 mL

## 2017-02-02 MED ORDER — METHYLPREDNISOLONE ACETATE 40 MG/ML IJ SUSP
40.0000 mg | INTRAMUSCULAR | Status: AC | PRN
  Administered 2017-02-02: 40 mg via INTRA_ARTICULAR

## 2017-02-02 NOTE — Progress Notes (Signed)
Office Visit Note   Patient: Rebecca Norman           Date of Birth: [birth year]           MRN: [id number] Visit Date: 02/02/2017              Requested by: [name], MD [address] [place], [place] [address] PCP: [name], MD  Chief Complaint  Patient presents with  . Right Ankle - Pain    States painful at times ibuprofen helps hurts with WB had xrays on 11/30/16 here, no better since last visit       HPI: Patient is a 83-year-old woman presents in follow-up for her right ankle. She states she's been having anterior lateral joint line pain. The past several months. She has pain with activities of daily living pain with weightbearing.  Assessment & Plan: Visit Diagnoses:  1. Pain in right ankle and joints of right foot     Plan: Patient's ankle was injected from anterior medial portal. She will follow up as needed. This should resolve her impingement symptoms.  Follow-Up Instructions: Return if symptoms worsen or fail to improve.   Ortho Exam  Patient is alert, oriented, no adenopathy, well-dressed, normal affect, normal respiratory effort. Examination she has an antalgic gait she has a good pulse she is tender to palpation anteriorly over the ankle joint medially and laterally but mostly over the lateral joint line. The sinus Tarsi is nontender to palpation. She also has some tenderness to palpation in the posterior aspect of the ankle. There is no redness no cellulitis no effusion no signs of infection.  Imaging: No results found.  Labs: Lab Results  Component Value Date   REPTSTATUS 05/30/2008 FINAL 05/28/2008   CULT  05/28/2008    Multiple bacterial morphotypes present, none predominant. Suggest appropriate recollection if clinically indicated.    Orders:  No orders of the defined types were placed in this encounter.  No orders of the defined types were placed in this encounter.    Procedures: Medium Joint Inj Date/Time:  02/02/2017 2:13 PM Performed by: Mazal Ebey V Authorized by: Newt Minion   Consent Given by:  Patient Site marked: the procedure site was marked   Timeout: prior to procedure the correct patient, procedure, and site was verified   Indications:  Pain and diagnostic evaluation Location:  Ankle Site:  R ankle Prep: patient was prepped and draped in usual sterile fashion   Needle Size:  22 G Needle Length:  1.5 inches Approach:  Anteromedial Ultrasound Guided: No   Fluoroscopic Guidance: No   Medications:  2 mL lidocaine 1 %; 40 mg methylPREDNISolone acetate 40 MG/ML Aspiration Attempted: No   Patient tolerance:  Patient tolerated the procedure well with no immediate complications    Clinical Data: No additional findings.  ROS:  All other systems negative, except as noted in the HPI. Review of Systems  Objective: Vital Signs: Ht 5\' 4"  (1.626 m)   Wt 172 lb (78 kg)   BMI 29.52 kg/m   Specialty Comments:  No specialty comments available.  PMFS History: Patient Active Problem List   Diagnosis Date Noted  . S/P laminectomy 07/10/2013  . HTN (hypertension) 07/10/2013  . BLADDER CANCER 12/17/2008  . DIVERTICULOSIS OF COLON 12/12/2008  . COLONIC POLYPS, HYPERPLASTIC, HX OF 12/12/2008   Past Medical History:  Diagnosis Date  . DDD (degenerative disc disease), cervical    severe/  auto fusion c2-c5  .  Diverticulosis   . Hemorrhoids   . History of adenomatous polyp of colon    1995  adenomatous polyp/  1997 & 2003  hyperplastic polyp's  . History of bladder cancer urologist-  dr [name]   s/p  TURBT's  . History of cancer of ureter   . Hypertension   . Nocturia   . Osteoarthritis     Family History  Problem Relation Age of Onset  . Breast cancer Sister   . Stomach cancer Sister   . Colon cancer Neg Hx     Past Surgical History:  Procedure Laterality Date  . CATARACT EXTRACTION W/ INTRAOCULAR LENS  IMPLANT, BILATERAL Bilateral   . CYSTOSCOPY W/ RETROGRADES   10/03/2011   Procedure: CYSTOSCOPY WITH RETROGRADE PYELOGRAM;  Surgeon: Molli Hazard, MD;  Location: [facility];  Service: Urology;;  . Consuela Mimes W/ RETROGRADES Bilateral 06/17/2016   Procedure: CYSTOSCOPY WITH RETROGRADE PYELOGRAM;  Surgeon: Alexis Frock, MD;  Location: [facility];  Service: Urology;  Laterality: Bilateral;  . CYSTOSCOPY WITH BIOPSY N/A 06/17/2016   Procedure: CYSTOSCOPY WITH BIOPSY AND FULGERATION;  Surgeon: Alexis Frock, MD;  Location: [facility];  Service: Urology;  Laterality: N/A;  . LAMINECTOMY  2009   L3 - 4  . TRANSURETHRAL RESECTION OF BLADDER  x3 --  2009; 2010; 06-29-2010  . TRANSURETHRAL RESECTION OF BLADDER TUMOR  10/03/2011   Procedure: TRANSURETHRAL RESECTION OF BLADDER TUMOR (TURBT);  Surgeon: Molli Hazard, MD;  Location: [facility];  Service: Urology;  Laterality: N/A;   Social History   Occupational History  . Retired    Social History Main Topics  . Smoking status: Former Smoker    Years: 10.00    Types: Cigarettes    Quit date: 09/25/2005  . Smokeless tobacco: Never Used  . Alcohol use Yes  . Drug use: No  . Sexual activity: Not on file

## 2017-02-03 DIAGNOSIS — M150 Primary generalized (osteo)arthritis: Secondary | ICD-10-CM | POA: Diagnosis not present

## 2017-02-03 DIAGNOSIS — G43809 Other migraine, not intractable, without status migrainosus: Secondary | ICD-10-CM | POA: Diagnosis not present

## 2017-02-03 DIAGNOSIS — M47812 Spondylosis without myelopathy or radiculopathy, cervical region: Secondary | ICD-10-CM | POA: Diagnosis not present

## 2017-02-03 DIAGNOSIS — I10 Essential (primary) hypertension: Secondary | ICD-10-CM | POA: Diagnosis not present

## 2017-02-03 DIAGNOSIS — M6281 Muscle weakness (generalized): Secondary | ICD-10-CM | POA: Diagnosis not present

## 2017-02-03 DIAGNOSIS — M7751 Other enthesopathy of right foot: Secondary | ICD-10-CM | POA: Diagnosis not present

## 2017-02-07 DIAGNOSIS — M47812 Spondylosis without myelopathy or radiculopathy, cervical region: Secondary | ICD-10-CM | POA: Diagnosis not present

## 2017-02-07 DIAGNOSIS — G43809 Other migraine, not intractable, without status migrainosus: Secondary | ICD-10-CM | POA: Diagnosis not present

## 2017-02-07 DIAGNOSIS — I10 Essential (primary) hypertension: Secondary | ICD-10-CM | POA: Diagnosis not present

## 2017-02-07 DIAGNOSIS — M6281 Muscle weakness (generalized): Secondary | ICD-10-CM | POA: Diagnosis not present

## 2017-02-07 DIAGNOSIS — M150 Primary generalized (osteo)arthritis: Secondary | ICD-10-CM | POA: Diagnosis not present

## 2017-02-07 DIAGNOSIS — M7751 Other enthesopathy of right foot: Secondary | ICD-10-CM | POA: Diagnosis not present

## 2017-02-16 DIAGNOSIS — G43809 Other migraine, not intractable, without status migrainosus: Secondary | ICD-10-CM | POA: Diagnosis not present

## 2017-02-16 DIAGNOSIS — M47812 Spondylosis without myelopathy or radiculopathy, cervical region: Secondary | ICD-10-CM | POA: Diagnosis not present

## 2017-02-16 DIAGNOSIS — M150 Primary generalized (osteo)arthritis: Secondary | ICD-10-CM | POA: Diagnosis not present

## 2017-02-16 DIAGNOSIS — M7751 Other enthesopathy of right foot: Secondary | ICD-10-CM | POA: Diagnosis not present

## 2017-02-16 DIAGNOSIS — M6281 Muscle weakness (generalized): Secondary | ICD-10-CM | POA: Diagnosis not present

## 2017-02-16 DIAGNOSIS — I10 Essential (primary) hypertension: Secondary | ICD-10-CM | POA: Diagnosis not present

## 2017-02-20 DIAGNOSIS — M150 Primary generalized (osteo)arthritis: Secondary | ICD-10-CM | POA: Diagnosis not present

## 2017-02-20 DIAGNOSIS — M7751 Other enthesopathy of right foot: Secondary | ICD-10-CM | POA: Diagnosis not present

## 2017-02-20 DIAGNOSIS — I10 Essential (primary) hypertension: Secondary | ICD-10-CM | POA: Diagnosis not present

## 2017-02-20 DIAGNOSIS — M6281 Muscle weakness (generalized): Secondary | ICD-10-CM | POA: Diagnosis not present

## 2017-02-20 DIAGNOSIS — G43809 Other migraine, not intractable, without status migrainosus: Secondary | ICD-10-CM | POA: Diagnosis not present

## 2017-02-20 DIAGNOSIS — M47812 Spondylosis without myelopathy or radiculopathy, cervical region: Secondary | ICD-10-CM | POA: Diagnosis not present

## 2017-02-23 DIAGNOSIS — M6281 Muscle weakness (generalized): Secondary | ICD-10-CM | POA: Diagnosis not present

## 2017-02-23 DIAGNOSIS — M150 Primary generalized (osteo)arthritis: Secondary | ICD-10-CM | POA: Diagnosis not present

## 2017-02-23 DIAGNOSIS — M47812 Spondylosis without myelopathy or radiculopathy, cervical region: Secondary | ICD-10-CM | POA: Diagnosis not present

## 2017-02-23 DIAGNOSIS — I10 Essential (primary) hypertension: Secondary | ICD-10-CM | POA: Diagnosis not present

## 2017-02-23 DIAGNOSIS — M7751 Other enthesopathy of right foot: Secondary | ICD-10-CM | POA: Diagnosis not present

## 2017-02-23 DIAGNOSIS — G43809 Other migraine, not intractable, without status migrainosus: Secondary | ICD-10-CM | POA: Diagnosis not present

## 2017-02-27 DIAGNOSIS — I10 Essential (primary) hypertension: Secondary | ICD-10-CM | POA: Diagnosis not present

## 2017-02-27 DIAGNOSIS — M47812 Spondylosis without myelopathy or radiculopathy, cervical region: Secondary | ICD-10-CM | POA: Diagnosis not present

## 2017-02-27 DIAGNOSIS — M6281 Muscle weakness (generalized): Secondary | ICD-10-CM | POA: Diagnosis not present

## 2017-02-27 DIAGNOSIS — M150 Primary generalized (osteo)arthritis: Secondary | ICD-10-CM | POA: Diagnosis not present

## 2017-02-27 DIAGNOSIS — M7751 Other enthesopathy of right foot: Secondary | ICD-10-CM | POA: Diagnosis not present

## 2017-02-27 DIAGNOSIS — G43809 Other migraine, not intractable, without status migrainosus: Secondary | ICD-10-CM | POA: Diagnosis not present

## 2017-03-01 DIAGNOSIS — M810 Age-related osteoporosis without current pathological fracture: Secondary | ICD-10-CM | POA: Diagnosis not present

## 2017-03-01 DIAGNOSIS — M8588 Other specified disorders of bone density and structure, other site: Secondary | ICD-10-CM | POA: Diagnosis not present

## 2017-03-02 ENCOUNTER — Encounter (INDEPENDENT_AMBULATORY_CARE_PROVIDER_SITE_OTHER): Payer: Self-pay | Admitting: Orthopedic Surgery

## 2017-03-02 ENCOUNTER — Ambulatory Visit (INDEPENDENT_AMBULATORY_CARE_PROVIDER_SITE_OTHER): Payer: Medicare Other | Admitting: Orthopedic Surgery

## 2017-03-02 VITALS — Ht 64.0 in | Wt 172.0 lb

## 2017-03-02 DIAGNOSIS — M25571 Pain in right ankle and joints of right foot: Secondary | ICD-10-CM

## 2017-03-03 DIAGNOSIS — M6281 Muscle weakness (generalized): Secondary | ICD-10-CM | POA: Diagnosis not present

## 2017-03-03 DIAGNOSIS — M7751 Other enthesopathy of right foot: Secondary | ICD-10-CM | POA: Diagnosis not present

## 2017-03-03 DIAGNOSIS — I10 Essential (primary) hypertension: Secondary | ICD-10-CM | POA: Diagnosis not present

## 2017-03-03 DIAGNOSIS — M150 Primary generalized (osteo)arthritis: Secondary | ICD-10-CM | POA: Diagnosis not present

## 2017-03-03 DIAGNOSIS — M47812 Spondylosis without myelopathy or radiculopathy, cervical region: Secondary | ICD-10-CM | POA: Diagnosis not present

## 2017-03-03 DIAGNOSIS — G43809 Other migraine, not intractable, without status migrainosus: Secondary | ICD-10-CM | POA: Diagnosis not present

## 2017-03-06 NOTE — Progress Notes (Signed)
Office Visit Note   Patient: Rebecca Norman           Date of Birth: [birth year]           MRN: [id number] Visit Date: 03/02/2017              Requested by: [name], MD [address] [place], [place] [address] PCP: [name], MD  Chief Complaint  Patient presents with  . Right Ankle - Pain    S/p injection about 1 month ago.       HPI: Patient presents in follow-up for ankle impingement right ankle she has been having pain for approximately 3 months. The injection in the ankle about 4 weeks ago did not provide any relief. She has pain with start up.  Assessment & Plan: Visit Diagnoses:  1. Pain in right ankle and joints of right foot     Plan: Recommended continued heel cord stretching continued protective shoe wear. Discussed that if her symptoms worsen then we could consider arthroscopic intervention.  Follow-Up Instructions: Return in about 4 weeks (around 03/30/2017).   Ortho Exam  Patient is alert, oriented, no adenopathy, well-dressed, normal affect, normal respiratory effort. Examination patient does have an antalgic gait she has good pulses she has pain to palpation anteriorly over the ankle with subtalar joint is nontender with range of motion or no plantar ulcers that are no venous stasis ulcers.  Imaging: No results found.  Labs: Lab Results  Component Value Date   REPTSTATUS 05/30/2008 FINAL 05/28/2008   CULT  05/28/2008    Multiple bacterial morphotypes present, none predominant. Suggest appropriate recollection if clinically indicated.    Orders:  No orders of the defined types were placed in this encounter.  No orders of the defined types were placed in this encounter.    Procedures: No procedures performed  Clinical Data: No additional findings.  ROS:  All other systems negative, except as noted in the HPI. Review of Systems  Objective: Vital Signs: Ht 5\' 4"  (1.626 m)   Wt 172 lb (78 kg)   BMI 29.52 kg/m     Specialty Comments:  No specialty comments available.  PMFS History: Patient Active Problem List   Diagnosis Date Noted  . S/P laminectomy 07/10/2013  . HTN (hypertension) 07/10/2013  . BLADDER CANCER 12/17/2008  . DIVERTICULOSIS OF COLON 12/12/2008  . COLONIC POLYPS, HYPERPLASTIC, HX OF 12/12/2008   Past Medical History:  Diagnosis Date  . DDD (degenerative disc disease), cervical    severe/  auto fusion c2-c5  . Diverticulosis   . Hemorrhoids   . History of adenomatous polyp of colon    1995  adenomatous polyp/  1997 & 2003  hyperplastic polyp's  . History of bladder cancer urologist-  dr [name]   s/p  TURBT's  . History of cancer of ureter   . Hypertension   . Nocturia   . Osteoarthritis     Family History  Problem Relation Age of Onset  . Breast cancer Sister   . Stomach cancer Sister   . Colon cancer Neg Hx     Past Surgical History:  Procedure Laterality Date  . CATARACT EXTRACTION W/ INTRAOCULAR LENS  IMPLANT, BILATERAL Bilateral   . CYSTOSCOPY W/ RETROGRADES  10/03/2011   Procedure: CYSTOSCOPY WITH RETROGRADE PYELOGRAM;  Surgeon: Molli Hazard, MD;  Location: [facility];  Service: Urology;;  . Consuela Mimes W/ RETROGRADES Bilateral 06/17/2016   Procedure: CYSTOSCOPY WITH RETROGRADE PYELOGRAM;  Surgeon:  Alexis Frock, MD;  Location: [facility];  Service: Urology;  Laterality: Bilateral;  . CYSTOSCOPY WITH BIOPSY N/A 06/17/2016   Procedure: CYSTOSCOPY WITH BIOPSY AND FULGERATION;  Surgeon: Alexis Frock, MD;  Location: [facility];  Service: Urology;  Laterality: N/A;  . LAMINECTOMY  2009   L3 - 4  . TRANSURETHRAL RESECTION OF BLADDER  x3 --  2009; 2010; 06-29-2010  . TRANSURETHRAL RESECTION OF BLADDER TUMOR  10/03/2011   Procedure: TRANSURETHRAL RESECTION OF BLADDER TUMOR (TURBT);  Surgeon: Molli Hazard, MD;  Location[facility];  Service: Urology;  Laterality: N/A;   Social History    Occupational History  . Retired    Social History Main Topics  . Smoking status: Former Smoker    Years: 10.00    Types: Cigarettes    Quit date: 09/25/2005  . Smokeless tobacco: Never Used  . Alcohol use Yes  . Drug use: No  . Sexual activity: Not on file

## 2017-03-07 DIAGNOSIS — M150 Primary generalized (osteo)arthritis: Secondary | ICD-10-CM | POA: Diagnosis not present

## 2017-03-07 DIAGNOSIS — I10 Essential (primary) hypertension: Secondary | ICD-10-CM | POA: Diagnosis not present

## 2017-03-07 DIAGNOSIS — M6281 Muscle weakness (generalized): Secondary | ICD-10-CM | POA: Diagnosis not present

## 2017-03-07 DIAGNOSIS — M47812 Spondylosis without myelopathy or radiculopathy, cervical region: Secondary | ICD-10-CM | POA: Diagnosis not present

## 2017-03-07 DIAGNOSIS — G43809 Other migraine, not intractable, without status migrainosus: Secondary | ICD-10-CM | POA: Diagnosis not present

## 2017-03-07 DIAGNOSIS — M7751 Other enthesopathy of right foot: Secondary | ICD-10-CM | POA: Diagnosis not present

## 2017-03-09 DIAGNOSIS — M7751 Other enthesopathy of right foot: Secondary | ICD-10-CM | POA: Diagnosis not present

## 2017-03-09 DIAGNOSIS — M150 Primary generalized (osteo)arthritis: Secondary | ICD-10-CM | POA: Diagnosis not present

## 2017-03-09 DIAGNOSIS — G43809 Other migraine, not intractable, without status migrainosus: Secondary | ICD-10-CM | POA: Diagnosis not present

## 2017-03-09 DIAGNOSIS — M6281 Muscle weakness (generalized): Secondary | ICD-10-CM | POA: Diagnosis not present

## 2017-03-09 DIAGNOSIS — I10 Essential (primary) hypertension: Secondary | ICD-10-CM | POA: Diagnosis not present

## 2017-03-09 DIAGNOSIS — M47812 Spondylosis without myelopathy or radiculopathy, cervical region: Secondary | ICD-10-CM | POA: Diagnosis not present

## 2017-03-27 DIAGNOSIS — N8111 Cystocele, midline: Secondary | ICD-10-CM | POA: Diagnosis not present

## 2017-03-27 DIAGNOSIS — C671 Malignant neoplasm of dome of bladder: Secondary | ICD-10-CM | POA: Diagnosis not present

## 2017-03-27 DIAGNOSIS — C670 Malignant neoplasm of trigone of bladder: Secondary | ICD-10-CM | POA: Diagnosis not present

## 2017-03-27 DIAGNOSIS — N3946 Mixed incontinence: Secondary | ICD-10-CM | POA: Diagnosis not present

## 2017-05-11 DIAGNOSIS — R531 Weakness: Secondary | ICD-10-CM | POA: Diagnosis not present

## 2017-05-11 DIAGNOSIS — R399 Unspecified symptoms and signs involving the genitourinary system: Secondary | ICD-10-CM | POA: Diagnosis not present

## 2017-05-11 DIAGNOSIS — R002 Palpitations: Secondary | ICD-10-CM | POA: Diagnosis not present

## 2017-05-11 DIAGNOSIS — Z23 Encounter for immunization: Secondary | ICD-10-CM | POA: Diagnosis not present

## 2017-06-02 ENCOUNTER — Encounter: Payer: Self-pay | Admitting: Physician Assistant

## 2017-06-06 ENCOUNTER — Ambulatory Visit: Payer: Commercial Indemnity | Admitting: Physician Assistant

## 2017-06-13 ENCOUNTER — Encounter: Payer: Self-pay | Admitting: Physician Assistant

## 2017-06-13 ENCOUNTER — Ambulatory Visit (INDEPENDENT_AMBULATORY_CARE_PROVIDER_SITE_OTHER): Payer: Medicare Other | Admitting: Physician Assistant

## 2017-06-13 VITALS — BP 124/72 | HR 83 | Ht 64.0 in | Wt 173.0 lb

## 2017-06-13 DIAGNOSIS — R011 Cardiac murmur, unspecified: Secondary | ICD-10-CM | POA: Diagnosis not present

## 2017-06-13 DIAGNOSIS — I10 Essential (primary) hypertension: Secondary | ICD-10-CM

## 2017-06-13 DIAGNOSIS — R9431 Abnormal electrocardiogram [ECG] [EKG]: Secondary | ICD-10-CM | POA: Diagnosis not present

## 2017-06-13 DIAGNOSIS — R002 Palpitations: Secondary | ICD-10-CM | POA: Insufficient documentation

## 2017-06-13 DIAGNOSIS — E039 Hypothyroidism, unspecified: Secondary | ICD-10-CM | POA: Insufficient documentation

## 2017-06-13 NOTE — Progress Notes (Signed)
Cardiology Office Note    Date:  06/13/2017   ID:  Rebecca Norman, DOB Oct 06, 1924, MRN [id number]  PCP:  [name], MD  Cardiologist: New  Chief Complaint  Patient presents with  . Palpitations    History of Present Illness:   Rebecca Norman is a 83 y.o. female who is being seen today for the evaluation of abnormal EKG and palpitations at the request of [name], MD.  Patient has history of hypertension, bladder CA, arthritis, and hypothyroidism. Notes reviewed from primary care 05/11/17 and patient was complaining of help at patient's that felt like a fluttering in her chest off and on for 4-5 days. She complained of feeling tired but no chest pain shortness of breath or dizziness. CBC, TSH and see Matt were all normal. EKG was done and showed normal sinus rhythm with LVH and marked T-wave inversion anterior laterally. There was no prior EKG to compare to.  Patient comes in accompanied by her daughter who is a Press photographer. Patient still lives independently and drives. No history of heart disease. Has HTN, remote smoker but 40 pk years.No HLD, DM. Mother had MI 46 yrs old and CHF. Has balance issues past 6 months so walking is limited. Can walk 50 plus feet with a cane. Complains of heart fluttering for several months. Happens about once a week and lasts approximately 1 hr. Happens at rest and not when moving around. No chest tightness, dizziness, dyspnea, dyspnea on exertion or presycope  Past Medical History:  Diagnosis Date  . DDD (degenerative disc disease), cervical    severe/  auto fusion c2-c5  . Diverticulosis   . Hemorrhoids   . History of adenomatous polyp of colon    1995  adenomatous polyp/  1997 & 2003  hyperplastic polyp's  . History of bladder cancer urologist-  dr [name]   s/p  TURBT's  . History of cancer of ureter   . Hypertension   . Hypothyroid   . Nocturia   . Osteoarthritis   . Osteopenia     Past Surgical History:  Procedure Laterality  Date  . BACK SURGERY    . CATARACT EXTRACTION W/ INTRAOCULAR LENS  IMPLANT, BILATERAL Bilateral   . CYSTOSCOPY W/ RETROGRADES  10/03/2011   Procedure: CYSTOSCOPY WITH RETROGRADE PYELOGRAM;  Surgeon: Molli Hazard, MD;  Location: [facility];  Service: Urology;;  . Consuela Mimes W/ RETROGRADES Bilateral 06/17/2016   Procedure: CYSTOSCOPY WITH RETROGRADE PYELOGRAM;  Surgeon: Alexis Frock, MD;  Location: [facility];  Service: Urology;  Laterality: Bilateral;  . CYSTOSCOPY WITH BIOPSY N/A 06/17/2016   Procedure: CYSTOSCOPY WITH BIOPSY AND FULGERATION;  Surgeon: Alexis Frock, MD;  Location: [facility];  Service: Urology;  Laterality: N/A;  . LAMINECTOMY  2009   L3 - 4  . TRANSURETHRAL RESECTION OF BLADDER  x3 --  2009; 2010; 06-29-2010  . TRANSURETHRAL RESECTION OF BLADDER TUMOR  10/03/2011   Procedure: TRANSURETHRAL RESECTION OF BLADDER TUMOR (TURBT);  Surgeon: Molli Hazard, MD;  Location: [facility];  Service: Urology;  Laterality: N/A;    Current Medications: Current Meds  Medication Sig  . amLODipine (NORVASC) 10 MG tablet Take 10 mg by mouth every morning. One by mouth once daily  . Calcium 600-200 MG-UNIT tablet Take 1 tablet by mouth daily.  Marland Kitchen ipratropium (ATROVENT) 0.06 % nasal spray Place 2 sprays into both nostrils 3 (three) times daily as needed for rhinitis.  Marland Kitchen SYNTHROID 50 MCG  tablet      Allergies:   Flagyl [metronidazole] and Lisinopril   Social History   Social History  . Marital status: Widowed    Spouse name: N/A  . Number of children: N/A  . Years of education: N/A   Occupational History  . Retired    Social History Main Topics  . Smoking status: Former Smoker    Years: 10.00    Types: Cigarettes    Quit date: 09/25/2005  . Smokeless tobacco: Never Used  . Alcohol use Yes  . Drug use: No  . Sexual activity: Not Asked   Other Topics Concern  . None   Social History Narrative    . None     Family History:  The patient's family history includes Breast cancer in her sister; Hernia (age of onset: 83) in her sister; Stomach cancer in her sister.   ROS:   Please see the history of present illness.    Review of Systems  Constitution: Negative.  HENT: Negative.   Eyes: Negative.   Cardiovascular: Positive for palpitations.  Respiratory: Negative.   Hematologic/Lymphatic: Negative.   Musculoskeletal: Negative.  Negative for joint pain.  Gastrointestinal: Negative.   Genitourinary: Positive for frequency.  Neurological: Positive for loss of balance.   All other systems reviewed and are negative.   PHYSICAL EXAM:   VS:  BP 124/72   Pulse 83   Ht 5\' 4"  (1.626 m)   Wt 173 lb (78.5 kg)   SpO2 94%   BMI 29.70 kg/m   Physical Exam  GEN: Well nourished, well developed, in no acute distress  Neck: no JVD, carotid bruits, or masses Cardiac:RRR some skipping;2/6 harsh sys murmur LSB and 2/6 sys murmur apex Respiratory:  clear to auscultation bilaterally, normal work of breathing GI: soft, nontender, nondistended, + BS Ext: without cyanosis, clubbing, or edema, Good distal pulses bilaterally Neuro:  Alert and Oriented x 3 Psych: euthymic mood, full affect  Wt Readings from Last 3 Encounters:  06/13/17 173 lb (78.5 kg)  03/02/17 172 lb (78 kg)  02/02/17 172 lb (78 kg)      Studies/Labs Reviewed:   EKG:  EKG is  ordered today.  The ekg ordered today demonstrates   EKG reviewed from 05/11/17 normal sinus rhythm with LVH and T wave inversion anterior lateral Recent Labs: 06/17/2016: BUN 20; Creatinine, Ser 0.80; Hemoglobin 14.6; Potassium 4.1; Sodium 140   Lipid Panel No results found for: CHOL, TRIG, HDL, CHOLHDL, VLDL, LDLCALC, LDLDIRECT  Additional studies/ records that were reviewed today include:      ASSESSMENT:    1. Palpitations   2. Abnormal EKG   3. Essential hypertension   4. Heart murmur      PLAN:  In order of problems listed  above:   Palpitations occur once a week and last 1 hr. EKG with PAC's, TSH and CMET normal. Order event monitor to rule out afib or other arrythmia  Abnormal EKG todays EKG Normal sinus rhythm with LVH TWI less prominent. Check 2Decho to assess wall motion and murmurs.  Essential hypertension controlled with Norvasc  Heart murmur is consistent with possible AS and MR. Check echo.      Medication Adjustments/Labs and Tests Ordered: Current medicines are reviewed at length with the patient today.  Concerns regarding medicines are outlined above.  Medication changes, Labs and Tests ordered today are listed in the Patient Instructions below. Patient Instructions  Medication Instructions:  Your physician recommends that you continue on your current  medications as directed. Please refer to the Current Medication list given to you today.   Labwork: None ordered  Testing/Procedures: Your physician has recommended that you wear an event monitor. Event monitors are medical devices that record the heart's electrical activity. Doctors most often [place] these monitors to diagnose arrhythmias. Arrhythmias are problems with the speed or rhythm of the heartbeat. The monitor is a small, portable device. You can wear one while you do your normal daily activities. This is usually used to diagnose what is causing palpitations/syncope (passing out).   Your physician has requested that you have an echocardiogram. Echocardiography is a painless test that uses sound waves to create images of your heart. It provides your doctor with information about the size and shape of your heart and how well your heart's chambers and valves are working. This procedure takes approximately one hour. There are no restrictions for this procedure.   Follow-Up: Your physician recommends that you schedule a follow-up appointment in: AFTER TESTS ARE COMPLETE WITH DR. VARANASI   Any Other Special Instructions Will Be Listed Below  (If Applicable).   Cardiac Event Monitoring A cardiac event monitor is a small recording device that is used to detect abnormal heart rhythms (arrhythmias). The monitor is used to record your heart rhythm when you have symptoms, such as:  Fast heartbeats (palpitations), such as heart racing or fluttering.  Dizziness.  Fainting or light-headedness.  Unexplained weakness.  Some monitors are wired to electrodes placed on your chest. Electrodes are flat, sticky disks that attach to your skin. Other monitors may be hand-held or worn on the wrist. The monitor can be worn for up to 30 days. If the monitor is attached to your chest, a technician will prepare your chest for the electrode placement and show you how to work the monitor. Take time to practice using the monitor before you leave the office. Make sure you understand how to send the information from the monitor to your health care provider. In some cases, you may need to use a landline telephone instead of a cell phone. What are the risks? Generally, this device is safe to use, but it possible that the skin under the electrodes will become irritated. How to use your cardiac event monitor  Wear your monitor at all times, except when you are in water: ? Do not let the monitor get wet. ? Take the monitor off when you bathe. Do not swim or use a hot tub with it on.  Keep your skin clean. Do not put body lotion or moisturizer on your chest.  Change the electrodes as told by your health care provider or any time they stop sticking to your skin. You may need to use medical tape to keep them on.  Try to put the electrodes in slightly different places on your chest to help prevent skin irritation. They must remain in the area under your left breast and in the upper right section of your chest.  Make sure the monitor is safely clipped to your clothing or in a location close to your body that your health care provider recommends.  Press the  button to record as soon as you feel heart-related symptoms, such as: ? Dizziness. ? Weakness. ? Light-headedness. ? Palpitations. ? Thumping or pounding in your chest. ? Shortness of breath. ? Unexplained weakness.  Keep a diary of your activities, such as walking, doing chores, and taking medicine. It is very important to note what you were doing when you  pushed the button to record your symptoms. This will help your health care provider determine what might be contributing to your symptoms.  Send the recorded information as recommended by your health care provider. It may take some time for your health care provider to process the results.  Change the batteries as told by your health care provider.  Keep electronic devices away from your monitor. This includes: ? Tablets. ? MP3 players. ? Cell phones.  While wearing your monitor you should avoid: ? Electric blankets. ? Armed forces operational officer. ? Electric toothbrushes. ? Microwave ovens. ? Magnets. ? Metal detectors. Get help right away if:  You have chest pain.  You have extreme difficulty breathing or shortness of breath.  You develop a very fast heartbeat that persists.  You develop dizziness that does not go away.  You faint or constantly feel like you are about to faint. Summary  A cardiac event monitor is a small recording device that is used to help detect abnormal heart rhythms (arrhythmias).  The monitor is used to record your heart rhythm when you have heart-related symptoms.  Make sure you understand how to send the information from the monitor to your health care provider.  It is important to press the button on the monitor when you have any heart-related symptoms.  Keep a diary of your activities, such as walking, doing chores, and taking medicine. It is very important to note what you were doing when you pushed the button to record your symptoms. This will help your health care provider learn what might be  causing your symptoms. This information is not intended to replace advice given to you by your health care provider. Make sure you discuss any questions you have with your health care provider. Document Released: 05/31/2008 Document Revised: 08/06/2016 Document Reviewed: 08/06/2016 Elsevier Interactive Patient Education  2017 [place].   Echocardiogram An echocardiogram, or echocardiography, uses sound waves (ultrasound) to produce an image of your heart. The echocardiogram is simple, painless, obtained within a short period of time, and offers valuable information to your health care provider. The images from an echocardiogram can provide information such as:  Evidence of coronary artery disease (CAD).  Heart size.  Heart muscle function.  Heart valve function.  Aneurysm detection.  Evidence of a past heart attack.  Fluid buildup around the heart.  Heart muscle thickening.  Assess heart valve function.  Tell a health care provider about:  Any allergies you have.  All medicines you are taking, including vitamins, herbs, eye drops, creams, and over-the-counter medicines.  Any problems you or family members have had with anesthetic medicines.  Any blood disorders you have.  Any surgeries you have had.  Any medical conditions you have.  Whether you are pregnant or may be pregnant. What happens before the procedure? No special preparation is needed. Eat and drink normally. What happens during the procedure?  In order to produce an image of your heart, gel will be applied to your chest and a wand-like tool (transducer) will be moved over your chest. The gel will help transmit the sound waves from the transducer. The sound waves will harmlessly bounce off your heart to allow the heart images to be captured in real-time motion. These images will then be recorded.  You may need an IV to receive a medicine that improves the quality of the pictures. What happens after the  procedure? You may return to your normal schedule including diet, activities, and medicines, unless your health care provider  tells you otherwise. This information is not intended to replace advice given to you by your health care provider. Make sure you discuss any questions you have with your health care provider. Document Released: 08/19/2000 Document Revised: 04/09/2016 Document Reviewed: 04/29/2013 Elsevier Interactive Patient Education  2017 [employer].   If you need a refill on your cardiac medications before your next appointment, please call your pharmacy.      Signed, Ermalinda Barrios, PA-C  06/13/2017 2:31 PM    [facility] [address], [place], [place]  [address] Phone: [phone]; Fax: [phone]

## 2017-06-13 NOTE — Patient Instructions (Addendum)
Medication Instructions:  Your physician recommends that you continue on your current medications as directed. Please refer to the Current Medication list given to you today.   Labwork: None ordered  Testing/Procedures: Your physician has recommended that you wear an event monitor. Event monitors are medical devices that record the heart's electrical activity. Doctors most often [place] these monitors to diagnose arrhythmias. Arrhythmias are problems with the speed or rhythm of the heartbeat. The monitor is a small, portable device. You can wear one while you do your normal daily activities. This is usually used to diagnose what is causing palpitations/syncope (passing out).   Your physician has requested that you have an echocardiogram. Echocardiography is a painless test that uses sound waves to create images of your heart. It provides your doctor with information about the size and shape of your heart and how well your heart's chambers and valves are working. This procedure takes approximately one hour. There are no restrictions for this procedure.   Follow-Up: Your physician recommends that you schedule a follow-up appointment in: AFTER TESTS ARE COMPLETE WITH DR. VARANASI   Any Other Special Instructions Will Be Listed Below (If Applicable).   Cardiac Event Monitoring A cardiac event monitor is a small recording device that is used to detect abnormal heart rhythms (arrhythmias). The monitor is used to record your heart rhythm when you have symptoms, such as:  Fast heartbeats (palpitations), such as heart racing or fluttering.  Dizziness.  Fainting or light-headedness.  Unexplained weakness.  Some monitors are wired to electrodes placed on your chest. Electrodes are flat, sticky disks that attach to your skin. Other monitors may be hand-held or worn on the wrist. The monitor can be worn for up to 30 days. If the monitor is attached to your chest, a technician will prepare your chest  for the electrode placement and show you how to work the monitor. Take time to practice using the monitor before you leave the office. Make sure you understand how to send the information from the monitor to your health care provider. In some cases, you may need to use a landline telephone instead of a cell phone. What are the risks? Generally, this device is safe to use, but it possible that the skin under the electrodes will become irritated. How to use your cardiac event monitor  Wear your monitor at all times, except when you are in water: ? Do not let the monitor get wet. ? Take the monitor off when you bathe. Do not swim or use a hot tub with it on.  Keep your skin clean. Do not put body lotion or moisturizer on your chest.  Change the electrodes as told by your health care provider or any time they stop sticking to your skin. You may need to use medical tape to keep them on.  Try to put the electrodes in slightly different places on your chest to help prevent skin irritation. They must remain in the area under your left breast and in the upper right section of your chest.  Make sure the monitor is safely clipped to your clothing or in a location close to your body that your health care provider recommends.  Press the button to record as soon as you feel heart-related symptoms, such as: ? Dizziness. ? Weakness. ? Light-headedness. ? Palpitations. ? Thumping or pounding in your chest. ? Shortness of breath. ? Unexplained weakness.  Keep a diary of your activities, such as walking, doing chores, and taking medicine. It  is very important to note what you were doing when you pushed the button to record your symptoms. This will help your health care provider determine what might be contributing to your symptoms.  Send the recorded information as recommended by your health care provider. It may take some time for your health care provider to process the results.  Change the batteries as  told by your health care provider.  Keep electronic devices away from your monitor. This includes: ? Tablets. ? MP3 players. ? Cell phones.  While wearing your monitor you should avoid: ? Electric blankets. ? Armed forces operational officer. ? Electric toothbrushes. ? Microwave ovens. ? Magnets. ? Metal detectors. Get help right away if:  You have chest pain.  You have extreme difficulty breathing or shortness of breath.  You develop a very fast heartbeat that persists.  You develop dizziness that does not go away.  You faint or constantly feel like you are about to faint. Summary  A cardiac event monitor is a small recording device that is used to help detect abnormal heart rhythms (arrhythmias).  The monitor is used to record your heart rhythm when you have heart-related symptoms.  Make sure you understand how to send the information from the monitor to your health care provider.  It is important to press the button on the monitor when you have any heart-related symptoms.  Keep a diary of your activities, such as walking, doing chores, and taking medicine. It is very important to note what you were doing when you pushed the button to record your symptoms. This will help your health care provider learn what might be causing your symptoms. This information is not intended to replace advice given to you by your health care provider. Make sure you discuss any questions you have with your health care provider. Document Released: 05/31/2008 Document Revised: 08/06/2016 Document Reviewed: 08/06/2016 Elsevier Interactive Patient Education  2017 [place].   Echocardiogram An echocardiogram, or echocardiography, uses sound waves (ultrasound) to produce an image of your heart. The echocardiogram is simple, painless, obtained within a short period of time, and offers valuable information to your health care provider. The images from an echocardiogram can provide information such as:  Evidence  of coronary artery disease (CAD).  Heart size.  Heart muscle function.  Heart valve function.  Aneurysm detection.  Evidence of a past heart attack.  Fluid buildup around the heart.  Heart muscle thickening.  Assess heart valve function.  Tell a health care provider about:  Any allergies you have.  All medicines you are taking, including vitamins, herbs, eye drops, creams, and over-the-counter medicines.  Any problems you or family members have had with anesthetic medicines.  Any blood disorders you have.  Any surgeries you have had.  Any medical conditions you have.  Whether you are pregnant or may be pregnant. What happens before the procedure? No special preparation is needed. Eat and drink normally. What happens during the procedure?  In order to produce an image of your heart, gel will be applied to your chest and a wand-like tool (transducer) will be moved over your chest. The gel will help transmit the sound waves from the transducer. The sound waves will harmlessly bounce off your heart to allow the heart images to be captured in real-time motion. These images will then be recorded.  You may need an IV to receive a medicine that improves the quality of the pictures. What happens after the procedure? You may return to your normal  schedule including diet, activities, and medicines, unless your health care provider tells you otherwise. This information is not intended to replace advice given to you by your health care provider. Make sure you discuss any questions you have with your health care provider. Document Released: 08/19/2000 Document Revised: 04/09/2016 Document Reviewed: 04/29/2013 Elsevier Interactive Patient Education  2017 [employer].   If you need a refill on your cardiac medications before your next appointment, please call your pharmacy.

## 2017-06-28 ENCOUNTER — Other Ambulatory Visit: Payer: Self-pay | Admitting: Physician Assistant

## 2017-06-28 DIAGNOSIS — R011 Cardiac murmur, unspecified: Secondary | ICD-10-CM

## 2017-06-28 DIAGNOSIS — R002 Palpitations: Secondary | ICD-10-CM

## 2017-06-29 ENCOUNTER — Ambulatory Visit (INDEPENDENT_AMBULATORY_CARE_PROVIDER_SITE_OTHER): Payer: Medicare Other

## 2017-06-29 ENCOUNTER — Telehealth: Payer: Self-pay | Admitting: Interventional Cardiology

## 2017-06-29 ENCOUNTER — Ambulatory Visit (HOSPITAL_COMMUNITY): Payer: Medicare Other | Attending: Cardiovascular Disease

## 2017-06-29 ENCOUNTER — Other Ambulatory Visit: Payer: Self-pay

## 2017-06-29 DIAGNOSIS — Z8554 Personal history of malignant neoplasm of ureter: Secondary | ICD-10-CM | POA: Diagnosis not present

## 2017-06-29 DIAGNOSIS — I10 Essential (primary) hypertension: Secondary | ICD-10-CM | POA: Insufficient documentation

## 2017-06-29 DIAGNOSIS — Z87891 Personal history of nicotine dependence: Secondary | ICD-10-CM | POA: Insufficient documentation

## 2017-06-29 DIAGNOSIS — R011 Cardiac murmur, unspecified: Secondary | ICD-10-CM

## 2017-06-29 DIAGNOSIS — Z8551 Personal history of malignant neoplasm of bladder: Secondary | ICD-10-CM | POA: Diagnosis not present

## 2017-06-29 DIAGNOSIS — R002 Palpitations: Secondary | ICD-10-CM | POA: Diagnosis not present

## 2017-06-29 DIAGNOSIS — I071 Rheumatic tricuspid insufficiency: Secondary | ICD-10-CM | POA: Insufficient documentation

## 2017-06-29 DIAGNOSIS — R9431 Abnormal electrocardiogram [ECG] [EKG]: Secondary | ICD-10-CM

## 2017-06-29 DIAGNOSIS — I371 Nonrheumatic pulmonary valve insufficiency: Secondary | ICD-10-CM | POA: Diagnosis not present

## 2017-06-29 DIAGNOSIS — Z8249 Family history of ischemic heart disease and other diseases of the circulatory system: Secondary | ICD-10-CM | POA: Insufficient documentation

## 2017-06-29 LAB — ECHOCARDIOGRAM COMPLETE
AOASC: 36 cm
AV Area VTI index: 1.61 cm2/m2
AV Area VTI: 2.7 cm2
AV Area mean vel: 3.18 cm2
AV Mean grad: 9 mmHg
AV peak Index: 1.47
AV pk vel: 211 cm/s
AVAREAMEANVIN: 1.73 cm2/m2
AVCELMEANRAT: 0.84
AVPG: 18 mmHg
Ao pk vel: 0.71 m/s
CHL CUP AV VEL: 2.97
CHL CUP DOP CALC LVOT VTI: 38.5 cm
CHL CUP MV M VEL: 69.4
DOP CAL AO MEAN VELOCITY: 140 cm/s
E decel time: 290 msec
E/e' ratio: 23.55
FS: 44 % (ref 28–44)
IV/PV OW: 1.23
LA ID, A-P, ES: 52 mm
LA diam index: 2.83 cm/m2
LA vol: 112 mL
LAVOLA4C: 91.4 mL
LAVOLIN: 60.9 mL/m2
LDCA: 3.8 cm2
LEFT ATRIUM END SYS DIAM: 52 mm
LV E/e'average: 23.55
LV TDI E'LATERAL: 6.2
LV e' LATERAL: 6.2 cm/s
LVEEMED: 23.55
LVOT MV VTI INDEX: 1.74 cm2/m2
LVOT MV VTI: 3.21
LVOT SV: 146 mL
LVOT peak VTI: 0.78 cm
LVOT peak grad rest: 9 mmHg
LVOT peak vel: 150 cm/s
LVOTD: 22 mm
MV Dec: 290
MVANNULUSVTI: 45.6 cm
MVAP: 2.14 cm2
MVPG: 9 mmHg
MVPKAVEL: 105 m/s
MVPKEVEL: 146 m/s
Mean grad: 3 mmHg
P 1/2 time: 103 ms
PW: 10.2 mm — AB (ref 0.6–1.1)
RV LATERAL S' VELOCITY: 12.4 cm/s
Reg peak vel: 312 cm/s
TAPSE: 17.6 mm
TDI e' medial: 4.79
TR max vel: 312 cm/s
VTI: 49.2 cm
Valve area index: 1.61
Valve area: 2.97 cm2

## 2017-06-29 NOTE — Telephone Encounter (Signed)
New message    Rebecca Norman from [employer] with a critical EKG report for pt.

## 2017-06-29 NOTE — Telephone Encounter (Signed)
Pt says her sister can take her tomorrow. Information/directions to AFib clinic given to patient.    Pt med list showing she is taking Norvasc & Hyzaar.  Pt reports she is not taking Norvasc.  B/c there is confusion with what medications she is taking - will not make any changes until she sees Agricultural engineer.  Advised pt to bring all medications that she is taking so that we can make decision of medication to start for HR control. She is also aware that Butch Penny will discuss blood thinner w/ her tomorrow.  Patient verbalized understanding and agreeable to plan.

## 2017-06-29 NOTE — Telephone Encounter (Signed)
Received call from [place] w/ [employer].  Event monitor showing AFib RVR sustained @ 150 bpm at 1:27 pm (ET). Pt reports that the only symptom was "feeling a tiny bit jittery". ChadsVasc = 4 Reviewed w/ DOD, Dr. Burt Knack -- order to stop Norvasc, start Toprol 25 mg daily & get appt w/ AFib clinic to further discuss need for blood thinner. Pt going to call her sister to see if she can give her a ride to AFib clinic appt offered for tomorrow.  Advised that I will call her later this afternoon to see if she can make it tomorrow.

## 2017-06-30 ENCOUNTER — Telehealth: Payer: Self-pay | Admitting: *Deleted

## 2017-06-30 ENCOUNTER — Ambulatory Visit (HOSPITAL_COMMUNITY)
Admission: RE | Admit: 2017-06-30 | Discharge: 2017-06-30 | Disposition: A | Discharge status: Home / Self Care | Payer: Medicare Other | Source: Ambulatory Visit | Attending: Nurse Practitioner | Admitting: Nurse Practitioner

## 2017-06-30 ENCOUNTER — Encounter (HOSPITAL_COMMUNITY): Payer: Self-pay | Admitting: Nurse Practitioner

## 2017-06-30 VITALS — BP 118/74 | HR 122 | Ht 64.0 in | Wt 173.0 lb

## 2017-06-30 DIAGNOSIS — M8580 Other specified disorders of bone density and structure, unspecified site: Secondary | ICD-10-CM | POA: Insufficient documentation

## 2017-06-30 DIAGNOSIS — Z7901 Long term (current) use of anticoagulants: Secondary | ICD-10-CM | POA: Diagnosis not present

## 2017-06-30 DIAGNOSIS — I10 Essential (primary) hypertension: Secondary | ICD-10-CM | POA: Diagnosis not present

## 2017-06-30 DIAGNOSIS — Z9889 Other specified postprocedural states: Secondary | ICD-10-CM | POA: Diagnosis not present

## 2017-06-30 DIAGNOSIS — I4891 Unspecified atrial fibrillation: Secondary | ICD-10-CM | POA: Diagnosis not present

## 2017-06-30 DIAGNOSIS — E039 Hypothyroidism, unspecified: Secondary | ICD-10-CM | POA: Insufficient documentation

## 2017-06-30 DIAGNOSIS — R002 Palpitations: Secondary | ICD-10-CM | POA: Diagnosis not present

## 2017-06-30 DIAGNOSIS — Z888 Allergy status to other drugs, medicaments and biological substances status: Secondary | ICD-10-CM | POA: Diagnosis not present

## 2017-06-30 DIAGNOSIS — Z800 Family history of malignant neoplasm of digestive organs: Secondary | ICD-10-CM | POA: Insufficient documentation

## 2017-06-30 DIAGNOSIS — Z791 Long term (current) use of non-steroidal anti-inflammatories (NSAID): Secondary | ICD-10-CM | POA: Insufficient documentation

## 2017-06-30 DIAGNOSIS — Z803 Family history of malignant neoplasm of breast: Secondary | ICD-10-CM | POA: Insufficient documentation

## 2017-06-30 DIAGNOSIS — Z87891 Personal history of nicotine dependence: Secondary | ICD-10-CM | POA: Insufficient documentation

## 2017-06-30 DIAGNOSIS — Z79899 Other long term (current) drug therapy: Secondary | ICD-10-CM | POA: Insufficient documentation

## 2017-06-30 DIAGNOSIS — M1990 Unspecified osteoarthritis, unspecified site: Secondary | ICD-10-CM | POA: Diagnosis not present

## 2017-06-30 DIAGNOSIS — Z8489 Family history of other specified conditions: Secondary | ICD-10-CM | POA: Diagnosis not present

## 2017-06-30 DIAGNOSIS — M5030 Other cervical disc degeneration, unspecified cervical region: Secondary | ICD-10-CM | POA: Insufficient documentation

## 2017-06-30 DIAGNOSIS — Z8551 Personal history of malignant neoplasm of bladder: Secondary | ICD-10-CM | POA: Diagnosis not present

## 2017-06-30 DIAGNOSIS — Z9841 Cataract extraction status, right eye: Secondary | ICD-10-CM | POA: Insufficient documentation

## 2017-06-30 MED ORDER — METOPROLOL TARTRATE 25 MG PO TABS
12.5000 mg | ORAL_TABLET | Freq: Two times a day (BID) | ORAL | 6 refills | Status: DC
Start: 2017-06-30 — End: 2017-07-05

## 2017-06-30 MED ORDER — LOSARTAN POTASSIUM-HCTZ 100-25 MG PO TABS: 0.5000 | ORAL_TABLET | ORAL | Status: DC

## 2017-06-30 MED ORDER — APIXABAN 5 MG PO TABS: 5.0000 mg | ORAL_TABLET | Freq: Two times a day (BID) | ORAL | 0 refills | Status: DC

## 2017-06-30 NOTE — Telephone Encounter (Signed)
Received fax from [facility] notification.  Afib RVR sustained, HR 150 auto triggered on 10/25 at 12:26 pm.     Per chart review: addressed with DOD yesterday and patient is being seen in Afib clinic today at 11:45 to discuss medications.  [facility] faxed to Afib clinic, RN aware.

## 2017-06-30 NOTE — Patient Instructions (Addendum)
Your physician has recommended you make the following change in your medication:  1)Start Metoprolol 1/2 tablet twice a day (12.5mg ) 2)Start Eliquis 5mg  twice a day 3)Decrease losartan/HCTZ to 1/2 tablet a day

## 2017-06-30 NOTE — Addendum Note (Signed)
Encounter addended by: Sherran Needs, NP on: 06/30/2017  2:59 PM<BR>    Actions taken: LOS modified

## 2017-06-30 NOTE — Progress Notes (Signed)
Primary Care Physician: [name], MD Referring Physician: Ermalinda Barrios, PA-C   Rebecca Norman is a 83 y.o. female with a h/o recent symptoms of palpitations, discussed with PCP and monitor was placed. As soon as it was placed, Afib with RVR was detected. She is now in the afib clinc for f/u. She states that she is not aware of being in Afib currently with v rate of 120 bpm. Had an echo yesterday. No excessive caffeine, no significant snoring, no tobacco but does drink 2 glasses of wine a night. Walks with a cane but no falls.  Today, she denies symptoms of palpitations, chest pain, shortness of breath, orthopnea, PND, lower extremity edema, dizziness, presyncope, syncope, or neurologic sequela. The patient is tolerating medications without difficulties and is otherwise without complaint today.   Past Medical History:  Diagnosis Date  . DDD (degenerative disc disease), cervical    severe/  auto fusion c2-c5  . Diverticulosis   . Hemorrhoids   . History of adenomatous polyp of colon    1995  adenomatous polyp/  1997 & 2003  hyperplastic polyp's  . History of bladder cancer urologist-  dr [name]   s/p  TURBT's  . History of cancer of ureter   . Hypertension   . Hypothyroid   . Nocturia   . Osteoarthritis   . Osteopenia    Past Surgical History:  Procedure Laterality Date  . BACK SURGERY    . CATARACT EXTRACTION W/ INTRAOCULAR LENS  IMPLANT, BILATERAL Bilateral   . CYSTOSCOPY W/ RETROGRADES  10/03/2011   Procedure: CYSTOSCOPY WITH RETROGRADE PYELOGRAM;  Surgeon: Molli Hazard, MD;  Location: [facility];  Service: Urology;;  . Consuela Mimes W/ RETROGRADES Bilateral 06/17/2016   Procedure: CYSTOSCOPY WITH RETROGRADE PYELOGRAM;  Surgeon: Alexis Frock, MD;  Location: [facility];  Service: Urology;  Laterality: Bilateral;  . CYSTOSCOPY WITH BIOPSY N/A 06/17/2016   Procedure: CYSTOSCOPY WITH BIOPSY AND FULGERATION;  Surgeon: Alexis Frock,  MD;  Location: [facility];  Service: Urology;  Laterality: N/A;  . LAMINECTOMY  2009   L3 - 4  . TRANSURETHRAL RESECTION OF BLADDER  x3 --  2009; 2010; 06-29-2010  . TRANSURETHRAL RESECTION OF BLADDER TUMOR  10/03/2011   Procedure: TRANSURETHRAL RESECTION OF BLADDER TUMOR (TURBT);  Surgeon: Molli Hazard, MD;  Location: [facility];  Service: Urology;  Laterality: N/A;    Current Outpatient Prescriptions  Medication Sig Dispense Refill  . Ibuprofen 200 MG CAPS Take 200-400 mg by mouth 4 (four) times daily as needed (pain).    Marland Kitchen ipratropium (ATROVENT) 0.06 % nasal spray Place 2 sprays into both nostrils 3 (three) times daily as needed for rhinitis.    Marland Kitchen losartan-hydrochlorothiazide (HYZAAR) 100-25 MG tablet Take 0.5 tablets by mouth every morning. One by mouth once daily    . SYNTHROID 50 MCG tablet     . apixaban (ELIQUIS) 5 MG TABS tablet Take 1 tablet (5 mg total) by mouth 2 (two) times daily. 60 tablet 0  . metoprolol tartrate (LOPRESSOR) 25 MG tablet Take 0.5 tablets (12.5 mg total) by mouth 2 (two) times daily. 30 tablet 6   No current facility-administered medications for this encounter.     Allergies  Allergen Reactions  . Flagyl [Metronidazole] Hives  . Lisinopril Cough    Social History   Social History  . Marital status: Widowed    Spouse name: N/A  . Number of children: N/A  . Years of  education: N/A   Occupational History  . Retired    Social History Main Topics  . Smoking status: Former Smoker    Years: 10.00    Types: Cigarettes    Quit date: 09/25/2005  . Smokeless tobacco: Never Used  . Alcohol use Yes  . Drug use: No  . Sexual activity: Not on file   Other Topics Concern  . Not on file   Social History Narrative  . No narrative on file    Family History  Problem Relation Age of Onset  . Breast cancer Sister   . Stomach cancer Sister   . Hernia Sister 69       HERNIA SURGERY COMPLICATIONS  . Colon cancer  Neg Hx     ROS- All systems are reviewed and negative except as per the HPI above  Physical Exam: Vitals:   06/30/17 1140  BP: 118/74  Pulse: (!) 122  Weight: 173 lb (78.5 kg)  Height: 5\' 4"  (1.626 m)   Wt Readings from Last 3 Encounters:  06/30/17 173 lb (78.5 kg)  06/13/17 173 lb (78.5 kg)  03/02/17 172 lb (78 kg)    Labs: Lab Results  Component Value Date   NA 140 06/17/2016   K 4.1 06/17/2016   CL 100 (L) 06/17/2016   CO2 32 12/30/2008   GLUCOSE 102 (H) 06/17/2016   BUN 20 06/17/2016   CREATININE 0.80 06/17/2016   CALCIUM 9.6 12/30/2008   Lab Results  Component Value Date   INR 1.0 05/28/2008   No results found for: CHOL, HDL, LDLCALC, TRIG   GEN- The patient is well appearing, alert and oriented x 3 today.   Head- normocephalic, atraumatic Eyes-  Sclera clear, conjunctiva pink Ears- hearing intact Oropharynx- clear Neck- supple, no JVP Lymph- no cervical lymphadenopathy Lungs- Clear to ausculation bilaterally, normal work of breathing Heart- Regular rate and rhythm, no murmurs, rubs or gallops, PMI not laterally displaced GI- soft, NT, ND, + BS Extremities- no clubbing, cyanosis, or edema MS- no significant deformity or atrophy Skin- no rash or lesion Psych- euthymic mood, full affect Neuro- strength and sensation are intact  EKG-afib with RVR Epic records reviewed Echo-Study Conclusions  - Left ventricle: The cavity size was normal. Wall thickness was   increased in a pattern of mild LVH. Systolic function was normal.   The estimated ejection fraction was in the range of 60% to 65%.   The study is not technically sufficient to allow evaluation of LV   diastolic function. - Aortic valve: There was trivial regurgitation. Valve area (VTI):   2.97 cm^2. Valve area (Vmax): 2.7 cm^2. Valve area (Vmean): 3.18   cm^2. - Mitral valve: Severely calcified annulus. Valve area by pressure   half-time: 2.14 cm^2. Valve area by continuity equation (using    LVOT flow): 3.21 cm^2. - Left atrium: The atrium was moderately to severely dilated. - Atrial septum: No defect or patent foramen ovale was identified.    Assessment and Plan: 1. New onset afib wit RVR General education re afib Start metoprolol 25 mg 1/2 tab bid Will cut losartan-hctz in half to prevent hypotension Reduce wine to no more than 2 glasses a week Continue event monitor   2. Chadsvasc score of at least 4 Denies bleeding risk Bleeding precautions discussed Discussed options for anticoagulation Will start eliquis 5 mg bid, labs obtained form PCP that shows a creatinine of 0.75 on 05/11/17 Use tylenol for pain if needed instead of motrin   F/u mid  week next week, sooner if any issues  Butch Penny C. Mahari Strahm, [facility] [address] [place], [place] [address] [phone]

## 2017-07-05 ENCOUNTER — Ambulatory Visit (HOSPITAL_COMMUNITY)
Admission: RE | Admit: 2017-07-05 | Discharge: 2017-07-05 | Disposition: A | Discharge status: Home / Self Care | Payer: Medicare Other | Source: Ambulatory Visit | Attending: Nurse Practitioner | Admitting: Nurse Practitioner

## 2017-07-05 ENCOUNTER — Encounter (HOSPITAL_COMMUNITY): Payer: Self-pay | Admitting: Nurse Practitioner

## 2017-07-05 VITALS — BP 122/84 | HR 106 | Ht 64.0 in | Wt 174.0 lb

## 2017-07-05 DIAGNOSIS — M1990 Unspecified osteoarthritis, unspecified site: Secondary | ICD-10-CM | POA: Insufficient documentation

## 2017-07-05 DIAGNOSIS — I4891 Unspecified atrial fibrillation: Secondary | ICD-10-CM | POA: Insufficient documentation

## 2017-07-05 DIAGNOSIS — Z800 Family history of malignant neoplasm of digestive organs: Secondary | ICD-10-CM | POA: Diagnosis not present

## 2017-07-05 DIAGNOSIS — Z86010 Personal history of colonic polyps: Secondary | ICD-10-CM | POA: Diagnosis not present

## 2017-07-05 DIAGNOSIS — Z79899 Other long term (current) drug therapy: Secondary | ICD-10-CM | POA: Insufficient documentation

## 2017-07-05 DIAGNOSIS — Z803 Family history of malignant neoplasm of breast: Secondary | ICD-10-CM | POA: Diagnosis not present

## 2017-07-05 DIAGNOSIS — Z888 Allergy status to other drugs, medicaments and biological substances status: Secondary | ICD-10-CM | POA: Diagnosis not present

## 2017-07-05 DIAGNOSIS — Z87891 Personal history of nicotine dependence: Secondary | ICD-10-CM | POA: Insufficient documentation

## 2017-07-05 DIAGNOSIS — Z8551 Personal history of malignant neoplasm of bladder: Secondary | ICD-10-CM | POA: Diagnosis not present

## 2017-07-05 DIAGNOSIS — Z981 Arthrodesis status: Secondary | ICD-10-CM | POA: Insufficient documentation

## 2017-07-05 DIAGNOSIS — E039 Hypothyroidism, unspecified: Secondary | ICD-10-CM | POA: Diagnosis not present

## 2017-07-05 DIAGNOSIS — Z7901 Long term (current) use of anticoagulants: Secondary | ICD-10-CM | POA: Insufficient documentation

## 2017-07-05 DIAGNOSIS — I10 Essential (primary) hypertension: Secondary | ICD-10-CM | POA: Insufficient documentation

## 2017-07-05 DIAGNOSIS — M8580 Other specified disorders of bone density and structure, unspecified site: Secondary | ICD-10-CM | POA: Insufficient documentation

## 2017-07-05 MED ORDER — METOPROLOL TARTRATE 25 MG PO TABS: 25.0000 mg | ORAL_TABLET | Freq: Two times a day (BID) | ORAL | 6 refills | Status: DC

## 2017-07-05 NOTE — Progress Notes (Addendum)
Primary Care Physician: [name], MD Referring Physician: Ermalinda Barrios, PA-C   Rebecca Norman is a 83 y.o. female with a h/o recent symptoms of palpitations, discussed with PCP and monitor was placed. As soon as it was placed, Afib with RVR was detected. She is now in the afib clinc for f/u. She states that she is not aware of being in Afib currently with v rate of 120 bpm. Had an echo yesterday.  No excessive caffeine, no significant snoring, no tobacco but does drink 2 glasses of wine a night. Walks with a cane but no falls.  She was started on metoprolol 25 mg 1/2 tab bid and regular BP med cut in half.  10/31, she returns to afib clinic, after starting BB, She is in afib with slower rate. She is not  aware of afib today. She is begging to have monitor removed as it is very aggravating and causing her not to sleep.  Today, she denies symptoms of palpitations, chest pain, shortness of breath, orthopnea, PND, lower extremity edema, dizziness, presyncope, syncope, or neurologic sequela. The patient is tolerating medications without difficulties and is otherwise without complaint today.   Past Medical History:  Diagnosis Date  . DDD (degenerative disc disease), cervical    severe/  auto fusion c2-c5  . Diverticulosis   . Hemorrhoids   . History of adenomatous polyp of colon    1995  adenomatous polyp/  1997 & 2003  hyperplastic polyp's  . History of bladder cancer urologist-  dr [name]   s/p  TURBT's  . History of cancer of ureter   . Hypertension   . Hypothyroid   . Nocturia   . Osteoarthritis   . Osteopenia    Past Surgical History:  Procedure Laterality Date  . BACK SURGERY    . CATARACT EXTRACTION W/ INTRAOCULAR LENS  IMPLANT, BILATERAL Bilateral   . CYSTOSCOPY W/ RETROGRADES  10/03/2011   Procedure: CYSTOSCOPY WITH RETROGRADE PYELOGRAM;  Surgeon: Molli Hazard, MD;  Location: [facility];  Service: Urology;;  . Consuela Mimes W/ RETROGRADES  Bilateral 06/17/2016   Procedure: CYSTOSCOPY WITH RETROGRADE PYELOGRAM;  Surgeon: Alexis Frock, MD;  Location: [facility];  Service: Urology;  Laterality: Bilateral;  . CYSTOSCOPY WITH BIOPSY N/A 06/17/2016   Procedure: CYSTOSCOPY WITH BIOPSY AND FULGERATION;  Surgeon: Alexis Frock, MD;  Location: [facility];  Service: Urology;  Laterality: N/A;  . LAMINECTOMY  2009   L3 - 4  . TRANSURETHRAL RESECTION OF BLADDER  x3 --  2009; 2010; 06-29-2010  . TRANSURETHRAL RESECTION OF BLADDER TUMOR  10/03/2011   Procedure: TRANSURETHRAL RESECTION OF BLADDER TUMOR (TURBT);  Surgeon: Molli Hazard, MD;  Location: [facility];  Service: Urology;  Laterality: N/A;    Current Outpatient Prescriptions  Medication Sig Dispense Refill  . apixaban (ELIQUIS) 5 MG TABS tablet Take 1 tablet (5 mg total) by mouth 2 (two) times daily. 60 tablet 0  . ipratropium (ATROVENT) 0.06 % nasal spray Place 2 sprays into both nostrils 3 (three) times daily as needed for rhinitis.    Marland Kitchen losartan-hydrochlorothiazide (HYZAAR) 100-25 MG tablet Take 0.5 tablets by mouth every morning. One by mouth once daily    . metoprolol tartrate (LOPRESSOR) 25 MG tablet Take 1 tablet (25 mg total) by mouth 2 (two) times daily. 30 tablet 6  . SYNTHROID 50 MCG tablet     . acetaminophen (TYLENOL) 500 MG tablet Take 500 mg by mouth every 6 (  six) hours as needed.     No current facility-administered medications for this encounter.     Allergies  Allergen Reactions  . Flagyl [Metronidazole] Hives  . Lisinopril Cough    Social History   Social History  . Marital status: Widowed    Spouse name: N/A  . Number of children: N/A  . Years of education: N/A   Occupational History  . Retired    Social History Main Topics  . Smoking status: Former Smoker    Years: 10.00    Types: Cigarettes    Quit date: 09/25/2005  . Smokeless tobacco: Never Used  . Alcohol use Yes  . Drug use: No    . Sexual activity: Not on file   Other Topics Concern  . Not on file   Social History Narrative  . No narrative on file    Family History  Problem Relation Age of Onset  . Breast cancer Sister   . Stomach cancer Sister   . Hernia Sister 8       HERNIA SURGERY COMPLICATIONS  . Colon cancer Neg Hx     ROS- All systems are reviewed and negative except as per the HPI above  Physical Exam: Vitals:   07/05/17 1039  BP: 122/84  Pulse: (!) 106  Weight: 174 lb (78.9 kg)  Height: 5\' 4"  (1.626 m)   Wt Readings from Last 3 Encounters:  07/05/17 174 lb (78.9 kg)  06/30/17 173 lb (78.5 kg)  06/13/17 173 lb (78.5 kg)    Labs: Lab Results  Component Value Date   NA 140 06/17/2016   K 4.1 06/17/2016   CL 100 (L) 06/17/2016   CO2 32 12/30/2008   GLUCOSE 102 (H) 06/17/2016   BUN 20 06/17/2016   CREATININE 0.80 06/17/2016   CALCIUM 9.6 12/30/2008   Lab Results  Component Value Date   INR 1.0 05/28/2008   No results found for: CHOL, HDL, LDLCALC, TRIG   GEN- The patient is well appearing, alert and oriented x 3 today.   Head- normocephalic, atraumatic Eyes-  Sclera clear, conjunctiva pink Ears- hearing intact Oropharynx- clear Neck- supple, no JVP Lymph- no cervical lymphadenopathy Lungs- Clear to ausculation bilaterally, normal work of breathing Heart- Regular rate and rhythm, no murmurs, rubs or gallops, PMI not laterally displaced GI- soft, NT, ND, + BS Extremities- no clubbing, cyanosis, or edema MS- no significant deformity or atrophy Skin- no rash or lesion Psych- euthymic mood, full affect Neuro- strength and sensation are intact  EKG-afib with RVR Epic records reviewed Echo-Study Conclusions  - Left ventricle: The cavity size was normal. Wall thickness was   increased in a pattern of mild LVH. Systolic function was normal.   The estimated ejection fraction was in the range of 60% to 65%.   The study is not technically sufficient to allow evaluation  of LV   diastolic function. - Aortic valve: There was trivial regurgitation. Valve area (VTI):   2.97 cm^2. Valve area (Vmax): 2.7 cm^2. Valve area (Vmean): 3.18   cm^2. - Mitral valve: Severely calcified annulus. Valve area by pressure   half-time: 2.14 cm^2. Valve area by continuity equation (using   LVOT flow): 3.21 cm^2. - Left atrium: The atrium was moderately to severely dilated. - Atrial septum: No defect or patent foramen ovale was identified.    Assessment and Plan:  1. New onset afib wit RVR Reviewed education re afib again today Increase metoprolol to  25 mg  bid Continue  losartan-hctz at  half dose to prevent hypotension Reduce wine to no more than 2 glasses a week I think it is reasonable to discontinue monitor as we have identified pt's c/o palpitations and is is aggravating to  her and causing her to lose sleep  2. Chadsvasc score of at least 4 Continue eliquis 5 mg bid, labs obtained form PCP that shows a creatinine of 0.75 on 05/11/17 Use tylenol for pain if needed instead of motrin  F/u 1-2 weeks for EKG  Donna C. Carroll, [facility] [address] [place], [place] [address] [phone]

## 2017-07-05 NOTE — Patient Instructions (Addendum)
Your physician has recommended you make the following change in your medication: 1)Increase metoprolol to 25mg  twice a day   Call [facility] with appointment that works for you [phone]

## 2017-07-06 ENCOUNTER — Inpatient Hospital Stay (HOSPITAL_COMMUNITY): Admission: RE | Admit: 2017-07-06 | Payer: Medicare Other | Source: Ambulatory Visit | Admitting: Nurse Practitioner

## 2017-07-06 NOTE — Addendum Note (Signed)
Encounter addended by: Sherran Needs, NP on: 07/06/2017  8:34 AM<BR>    Actions taken: Sign clinical note

## 2017-07-10 ENCOUNTER — Telehealth (HOSPITAL_COMMUNITY): Payer: Self-pay | Admitting: *Deleted

## 2017-07-10 NOTE — Telephone Encounter (Signed)
Pt cld reporting that she feels jittery since "starting her new medicine"  Pt was unable to say which medication it was.  Stated she just didn't feel the same.  No pain or discomfort.  Pt could not get a bp since her machine kept giving error message.  Pt was advised per DC to cut the metoprolol to 1/2 tab (12.5 mg) bid and to follow up with [place] tomorrow.  This appt was made, but if for any reason pt can not get ride, she will call to find next avail.  Pt understood instructions.  Pt was advised not to miss any doses of blood thinner since she said she had not yet taken her meds today

## 2017-07-11 ENCOUNTER — Ambulatory Visit (HOSPITAL_COMMUNITY): Payer: Medicare Other | Admitting: Nurse Practitioner

## 2017-07-13 ENCOUNTER — Ambulatory Visit (HOSPITAL_COMMUNITY): Payer: Medicare Other | Admitting: Nurse Practitioner

## 2017-07-14 ENCOUNTER — Encounter (HOSPITAL_COMMUNITY): Payer: Self-pay | Admitting: Nurse Practitioner

## 2017-07-14 ENCOUNTER — Ambulatory Visit (HOSPITAL_COMMUNITY)
Admission: RE | Admit: 2017-07-14 | Discharge: 2017-07-14 | Disposition: A | Discharge status: Home / Self Care | Payer: Medicare Other | Source: Ambulatory Visit | Attending: Nurse Practitioner | Admitting: Nurse Practitioner

## 2017-07-14 VITALS — BP 142/84 | HR 123 | Ht 64.0 in | Wt 175.0 lb

## 2017-07-14 DIAGNOSIS — Z9889 Other specified postprocedural states: Secondary | ICD-10-CM | POA: Insufficient documentation

## 2017-07-14 DIAGNOSIS — Z87891 Personal history of nicotine dependence: Secondary | ICD-10-CM | POA: Diagnosis not present

## 2017-07-14 DIAGNOSIS — I10 Essential (primary) hypertension: Secondary | ICD-10-CM | POA: Insufficient documentation

## 2017-07-14 DIAGNOSIS — Z8551 Personal history of malignant neoplasm of bladder: Secondary | ICD-10-CM | POA: Insufficient documentation

## 2017-07-14 DIAGNOSIS — I4891 Unspecified atrial fibrillation: Secondary | ICD-10-CM | POA: Diagnosis not present

## 2017-07-14 DIAGNOSIS — Z803 Family history of malignant neoplasm of breast: Secondary | ICD-10-CM | POA: Insufficient documentation

## 2017-07-14 DIAGNOSIS — Z79899 Other long term (current) drug therapy: Secondary | ICD-10-CM | POA: Diagnosis not present

## 2017-07-14 DIAGNOSIS — Z808 Family history of malignant neoplasm of other organs or systems: Secondary | ICD-10-CM | POA: Diagnosis not present

## 2017-07-14 MED ORDER — METOPROLOL TARTRATE 25 MG PO TABS: 37.5000 mg | ORAL_TABLET | Freq: Two times a day (BID) | ORAL | 2 refills | Status: DC

## 2017-07-14 NOTE — Patient Instructions (Signed)
Your physician has recommended you make the following change in your medication:  1)Increase metoprolol to 1 and 1/2 tablets twice a day (37.5mg  twice a day)

## 2017-07-14 NOTE — Progress Notes (Signed)
Primary Care Physician: [name], MD Referring Physician: Ermalinda Barrios, PA-C   Rebecca Norman is a 83 y.o. female with a h/o recent symptoms of palpitations, discussed with PCP and monitor was placed. As soon as it was placed, Afib with RVR was detected. She was referred to the afib clinic 10/26. She  not aware of being in Afib  with v rate of 120 bpm.    No excessive caffeine, no significant snoring, no tobacco but does drink 2 glasses of wine a night, asked to reduce to 2 a week.[name] with a cane but no falls.  F/u in afib clinic, 11/9. She continues to have afib with RVR, tolerating well, but needs more rate control.  Today, she denies symptoms of palpitations, chest pain, shortness of breath, orthopnea, PND, lower extremity edema, dizziness, presyncope, syncope, or neurologic sequela. The patient is tolerating medications without difficulties and is otherwise without complaint today.   Past Medical History:  Diagnosis Date  . DDD (degenerative disc disease), cervical    severe/  auto fusion c2-c5  . Diverticulosis   . Hemorrhoids   . History of adenomatous polyp of colon    1995  adenomatous polyp/  1997 & 2003  hyperplastic polyp's  . History of bladder cancer urologist-  dr [name]   s/p  TURBT's  . History of cancer of ureter   . Hypertension   . Hypothyroid   . Nocturia   . Osteoarthritis   . Osteopenia    Past Surgical History:  Procedure Laterality Date  . BACK SURGERY    . CATARACT EXTRACTION W/ INTRAOCULAR LENS  IMPLANT, BILATERAL Bilateral   . LAMINECTOMY  2009   L3 - 4  . TRANSURETHRAL RESECTION OF BLADDER  x3 --  2009; 2010; 06-29-2010    Current Outpatient Medications  Medication Sig Dispense Refill  . acetaminophen (TYLENOL) 500 MG tablet Take 500 mg by mouth every 6 (six) hours as needed.    Marland Kitchen apixaban (ELIQUIS) 5 MG TABS tablet Take 1 tablet (5 mg total) by mouth 2 (two) times daily. 60 tablet 0  . ipratropium (ATROVENT) 0.06 % nasal spray  Place 2 sprays into both nostrils 3 (three) times daily as needed for rhinitis.    Marland Kitchen losartan-hydrochlorothiazide (HYZAAR) 100-25 MG tablet Take 0.5 tablets by mouth every morning. One by mouth once daily    . metoprolol tartrate (LOPRESSOR) 25 MG tablet Take 1.5 tablets (37.5 mg total) 2 (two) times daily by mouth. 270 tablet 2  . SYNTHROID 50 MCG tablet      No current facility-administered medications for this encounter.     Allergies  Allergen Reactions  . Flagyl [Metronidazole] Hives  . Lisinopril Cough    Social History   Socioeconomic History  . Marital status: Widowed    Spouse name: Not on file  . Number of children: Not on file  . Years of education: Not on file  . Highest education level: Not on file  Social Needs  . Financial resource strain: Not on file  . Food insecurity - worry: Not on file  . Food insecurity - inability: Not on file  . Transportation needs - medical: Not on file  . Transportation needs - non-medical: Not on file  Occupational History  . Occupation: Retired  Tobacco Use  . Smoking status: Former Smoker    Years: 10.00    Types: Cigarettes    Last attempt to quit: 09/25/2005    Years since quitting: 11.8  .  Smokeless tobacco: Never Used  Substance and Sexual Activity  . Alcohol use: Yes  . Drug use: No  . Sexual activity: Not on file  Other Topics Concern  . Not on file  Social History Narrative  . Not on file    Family History  Problem Relation Age of Onset  . Breast cancer Sister   . Stomach cancer Sister   . Hernia Sister 35       HERNIA SURGERY COMPLICATIONS  . Colon cancer Neg Hx     ROS- All systems are reviewed and negative except as per the HPI above  Physical Exam: Vitals:   07/14/17 1118  BP: (!) 142/84  Pulse: (!) 123  Weight: 175 lb (79.4 kg)  Height: 5\' 4"  (1.626 m)   Wt Readings from Last 3 Encounters:  07/14/17 175 lb (79.4 kg)  07/05/17 174 lb (78.9 kg)  06/30/17 173 lb (78.5 kg)    Labs: Lab  Results  Component Value Date   NA 140 06/17/2016   K 4.1 06/17/2016   CL 100 (L) 06/17/2016   CO2 32 12/30/2008   GLUCOSE 102 (H) 06/17/2016   BUN 20 06/17/2016   CREATININE 0.80 06/17/2016   CALCIUM 9.6 12/30/2008   Lab Results  Component Value Date   INR 1.0 05/28/2008   No results found for: CHOL, HDL, LDLCALC, TRIG   GEN- The patient is well appearing, alert and oriented x 3 today.   Head- normocephalic, atraumatic Eyes-  Sclera clear, conjunctiva pink Ears- hearing intact Oropharynx- clear Neck- supple, no JVP Lymph- no cervical lymphadenopathy Lungs- Clear to ausculation bilaterally, normal work of breathing Heart- Regular rate and rhythm, no murmurs, rubs or gallops, PMI not laterally displaced GI- soft, NT, ND, + BS Extremities- no clubbing, cyanosis, or edema MS- no significant deformity or atrophy Skin- no rash or lesion Psych- euthymic mood, full affect Neuro- strength and sensation are intact  EKG-afib with RVR at 123 bpm, qrs int 90 ms, qtc 498 ms Epic records reviewed Echo-Study Conclusions  - Left ventricle: The cavity size was normal. Wall thickness was   increased in a pattern of mild LVH. Systolic function was normal.   The estimated ejection fraction was in the range of 60% to 65%.   The study is not technically sufficient to allow evaluation of LV   diastolic function. - Aortic valve: There was trivial regurgitation. Valve area (VTI):   2.97 cm^2. Valve area (Vmax): 2.7 cm^2. Valve area (Vmean): 3.18   cm^2. - Mitral valve: Severely calcified annulus. Valve area by pressure   half-time: 2.14 cm^2. Valve area by continuity equation (using   LVOT flow): 3.21 cm^2. - Left atrium: The atrium was moderately to severely dilated. - Atrial septum: No defect or patent foramen ovale was identified.    Assessment and Plan: 1. New onset afib wit RVR General education re afib Increase metoprolol 25 mg to  1 1/2 tab bid Continue losartan-hctz at half  dose to prevent hypotension with increase in rate control Reduce wine to no more than 2 glasses a week     2. Chadsvasc score of at least 4 Denies bleeding risk Bleeding precautions discussed Start eliquis 5 mg bid, labs obtained form PCP that shows a creatinine of 0.75 on 05/11/17 Use tylenol for pain if needed instead of motrin   F/u one week  Butch Penny C. Jamilette Suchocki, [facility] [address] [place], [place] [address] [phone]

## 2017-07-21 ENCOUNTER — Encounter (HOSPITAL_COMMUNITY): Payer: Self-pay | Admitting: Nurse Practitioner

## 2017-07-21 ENCOUNTER — Ambulatory Visit (HOSPITAL_COMMUNITY)
Admission: RE | Admit: 2017-07-21 | Discharge: 2017-07-21 | Disposition: A | Discharge status: Home / Self Care | Payer: Medicare Other | Source: Ambulatory Visit | Attending: Nurse Practitioner | Admitting: Nurse Practitioner

## 2017-07-21 VITALS — BP 148/88 | HR 95 | Wt 175.0 lb

## 2017-07-21 DIAGNOSIS — M1990 Unspecified osteoarthritis, unspecified site: Secondary | ICD-10-CM | POA: Diagnosis not present

## 2017-07-21 DIAGNOSIS — Z86010 Personal history of colonic polyps: Secondary | ICD-10-CM | POA: Insufficient documentation

## 2017-07-21 DIAGNOSIS — M5030 Other cervical disc degeneration, unspecified cervical region: Secondary | ICD-10-CM | POA: Diagnosis not present

## 2017-07-21 DIAGNOSIS — M8580 Other specified disorders of bone density and structure, unspecified site: Secondary | ICD-10-CM | POA: Insufficient documentation

## 2017-07-21 DIAGNOSIS — Z8551 Personal history of malignant neoplasm of bladder: Secondary | ICD-10-CM | POA: Diagnosis not present

## 2017-07-21 DIAGNOSIS — Z9841 Cataract extraction status, right eye: Secondary | ICD-10-CM | POA: Diagnosis not present

## 2017-07-21 DIAGNOSIS — I4891 Unspecified atrial fibrillation: Secondary | ICD-10-CM | POA: Insufficient documentation

## 2017-07-21 DIAGNOSIS — I481 Persistent atrial fibrillation: Secondary | ICD-10-CM

## 2017-07-21 DIAGNOSIS — Z7901 Long term (current) use of anticoagulants: Secondary | ICD-10-CM | POA: Diagnosis not present

## 2017-07-21 DIAGNOSIS — Z9842 Cataract extraction status, left eye: Secondary | ICD-10-CM | POA: Insufficient documentation

## 2017-07-21 DIAGNOSIS — Z87891 Personal history of nicotine dependence: Secondary | ICD-10-CM | POA: Diagnosis not present

## 2017-07-21 DIAGNOSIS — Z803 Family history of malignant neoplasm of breast: Secondary | ICD-10-CM | POA: Insufficient documentation

## 2017-07-21 DIAGNOSIS — Z79899 Other long term (current) drug therapy: Secondary | ICD-10-CM | POA: Insufficient documentation

## 2017-07-21 DIAGNOSIS — E039 Hypothyroidism, unspecified: Secondary | ICD-10-CM | POA: Insufficient documentation

## 2017-07-21 DIAGNOSIS — I10 Essential (primary) hypertension: Secondary | ICD-10-CM | POA: Insufficient documentation

## 2017-07-21 DIAGNOSIS — Z961 Presence of intraocular lens: Secondary | ICD-10-CM | POA: Insufficient documentation

## 2017-07-21 DIAGNOSIS — I4819 Other persistent atrial fibrillation: Secondary | ICD-10-CM

## 2017-07-21 DIAGNOSIS — Z800 Family history of malignant neoplasm of digestive organs: Secondary | ICD-10-CM | POA: Diagnosis not present

## 2017-07-21 MED ORDER — METOPROLOL TARTRATE 25 MG PO TABS: 50.0000 mg | ORAL_TABLET | Freq: Two times a day (BID) | ORAL | 2 refills | Status: DC

## 2017-07-21 NOTE — Progress Notes (Signed)
Pt in for EKG since having some elevated BP and fast HR at home.  EKG to be reviewed by Roderic Palau, NP

## 2017-07-21 NOTE — Progress Notes (Signed)
Primary Care Physician: [name], MD Referring Physician: Ermalinda Barrios, PA-C   Rebecca Norman is a 83 y.o. female with a h/o recent symptoms of palpitations, discussed with PCP and monitor was placed. As soon as it was placed, Afib with RVR was detected. She was referred to the afib clinic 10/26. She  not aware of being in Afib  with v rate of 120 bpm.    No excessive caffeine, no significant snoring, no tobacco but does drink 2 glasses of wine a night, asked to reduce to 2 a week.Rebecca Norman with a cane but no falls. Eliquis started for a chadsvasc score of at least 4.  F/u in afib clinic, 11/9. She continues to have afib with RVR, tolerating well, but needs more rate control.   F/u in afib clinic, 11/16, continues in afib. Feels tired. Weight is stable. Continues on eliquis  without missed doses. HR is better controlled but still not optimal reviewing home HR's, a lot of HR's are around 110 bpm.  Today, she denies symptoms of palpitations, chest pain, shortness of breath, orthopnea, PND, lower extremity edema, dizziness, presyncope, syncope, or neurologic sequela. The patient is tolerating medications without difficulties and is otherwise without complaint today.   Past Medical History:  Diagnosis Date  . DDD (degenerative disc disease), cervical    severe/  auto fusion c2-c5  . Diverticulosis   . Hemorrhoids   . History of adenomatous polyp of colon    1995  adenomatous polyp/  1997 & 2003  hyperplastic polyp's  . History of bladder cancer urologist-  dr [name]   s/p  TURBT's  . History of cancer of ureter   . Hypertension   . Hypothyroid   . Nocturia   . Osteoarthritis   . Osteopenia    Past Surgical History:  Procedure Laterality Date  . BACK SURGERY    . CATARACT EXTRACTION W/ INTRAOCULAR LENS  IMPLANT, BILATERAL Bilateral   . CYSTOSCOPY WITH BIOPSY AND FULGERATION N/A 06/17/2016   Performed by Alexis Frock, MD at [facility]  . CYSTOSCOPY WITH  RETROGRADE PYELOGRAM Bilateral 06/17/2016   Performed by [facility]is Frock, MD at [facility]  . CYSTOSCOPY WITH RETROGRADE PYELOGRAM  10/03/2011   Performed by Molli Hazard, MD at [facility]  . LAMINECTOMY  2009   L3 - 4  . TRANSURETHRAL RESECTION OF BLADDER  x3 --  2009; 2010; 06-29-2010  . TRANSURETHRAL RESECTION OF BLADDER TUMOR (TURBT) N/A 10/03/2011   Performed by Molli Hazard, MD at [facility]    Current Outpatient Medications  Medication Sig Dispense Refill  . acetaminophen (TYLENOL) 500 MG tablet Take 500 mg by mouth every 6 (six) hours as needed.    Marland Kitchen apixaban (ELIQUIS) 5 MG TABS tablet Take 1 tablet (5 mg total) by mouth 2 (two) times daily. 60 tablet 0  . ipratropium (ATROVENT) 0.06 % nasal spray Place 2 sprays into both nostrils 3 (three) times daily as needed for rhinitis.    Marland Kitchen losartan-hydrochlorothiazide (HYZAAR) 100-25 MG tablet Take 0.5 tablets by mouth every morning. One by mouth once daily    . metoprolol tartrate (LOPRESSOR) 25 MG tablet Take 2 tablets (50 mg total) 2 (two) times daily by mouth. 270 tablet 2  . SYNTHROID 50 MCG tablet      No current facility-administered medications for this encounter.     Allergies  Allergen Reactions  . Flagyl [Metronidazole] Hives  . Lisinopril Cough  Social History   Socioeconomic History  . Marital status: Widowed    Spouse name: Not on file  . Number of children: Not on file  . Years of education: Not on file  . Highest education level: Not on file  Social Needs  . Financial resource strain: Not on file  . Food insecurity - worry: Not on file  . Food insecurity - inability: Not on file  . Transportation needs - medical: Not on file  . Transportation needs - non-medical: Not on file  Occupational History  . Occupation: Retired  Tobacco Use  . Smoking status: Former Smoker    Years: 10.00    Types: Cigarettes    Last attempt to quit: 09/25/2005      Years since quitting: 11.8  . Smokeless tobacco: Never Used  Substance and Sexual Activity  . Alcohol use: Yes  . Drug use: No  . Sexual activity: Not on file  Other Topics Concern  . Not on file  Social History Narrative  . Not on file    Family History  Problem Relation Age of Onset  . Breast cancer Sister   . Stomach cancer Sister   . Hernia Sister 87       HERNIA SURGERY COMPLICATIONS  . Colon cancer Neg Hx     ROS- All systems are reviewed and negative except as per the HPI above  Physical Exam: Vitals:   07/21/17 1127  BP: (!) 148/88  Pulse: 95  Weight: 175 lb (79.4 kg)   Wt Readings from Last 3 Encounters:  07/21/17 175 lb (79.4 kg)  07/14/17 175 lb (79.4 kg)  07/05/17 174 lb (78.9 kg)    Labs: Lab Results  Component Value Date   NA 140 06/17/2016   K 4.1 06/17/2016   CL 100 (L) 06/17/2016   CO2 32 12/30/2008   GLUCOSE 102 (H) 06/17/2016   BUN 20 06/17/2016   CREATININE 0.80 06/17/2016   CALCIUM 9.6 12/30/2008   Lab Results  Component Value Date   INR 1.0 05/28/2008   No results found for: CHOL, HDL, LDLCALC, TRIG   GEN- The patient is well appearing, alert and oriented x 3 today.   Head- normocephalic, atraumatic Eyes-  Sclera clear, conjunctiva pink Ears- hearing intact Oropharynx- clear Neck- supple, no JVP Lymph- no cervical lymphadenopathy Lungs- Clear to ausculation bilaterally, normal work of breathing Heart- irregular rate and rhythm, no murmurs, rubs or gallops, PMI not laterally displaced GI- soft, NT, ND, + BS Extremities- no clubbing, cyanosis, or edema MS- no significant deformity or atrophy Skin- no rash or lesion Psych- euthymic mood, full affect Neuro- strength and sensation are intact  EKG-afib with RVR at 95  bpm, qrs int 94 ms, qtc 477 ms Epic records reviewed Echo-Study Conclusions  - Left ventricle: The cavity size was normal. Wall thickness was   increased in a pattern of mild LVH. Systolic function was  normal.   The estimated ejection fraction was in the range of 60% to 65%.   The study is not technically sufficient to allow evaluation of LV   diastolic function. - Aortic valve: There was trivial regurgitation. Valve area (VTI):   2.97 cm^2. Valve area (Vmax): 2.7 cm^2. Valve area (Vmean): 3.18   cm^2. - Mitral valve: Severely calcified annulus. Valve area by pressure   half-time: 2.14 cm^2. Valve area by continuity equation (using   LVOT flow): 3.21 cm^2. - Left atrium: The atrium was moderately to severely dilated. 52 mm - Atrial  septum: No defect or patent foramen ovale was identified.    Assessment and Plan: 1. New onset afib with RVR General education re afib Options to manage afib reviewed First option- continuing with rate control and increase to 50 mg bid Second option- plan on cardioversion alone, but I have doubt that she would stay in rhythm with left atrial size of 52 mm 3rd option- start amiodarone at 200 mg a day and try cardioversion after one month She will try increasing the metoprolol for now to 50 mg bid and will discuss the other options with her family over Thanksgiving F/u 11/26 for more discussion PT states that she stopped her losartan/hctz which she was only taking half, as she feels the diuretic is making her have more incontinence  Reduce wine to no more than 2 glasses a week     2. Chadsvasc score of at least 4 Denies bleeding risk Bleeding precautions discussed Continue eliquis 5 mg bid, labs obtained form PCP that shows a creatinine of 0.75 on 05/11/17 Use tylenol for pain if needed instead of motrin   F/u 11/26  Charmayne Odell C. Jaiyden Laur, [facility] [address] [place], [place] [address] [phone]  ing

## 2017-07-21 NOTE — Patient Instructions (Signed)
Your physician has recommended you make the following change in your medication:  1) Increase metoprolol to 50mg  twice a day (2 of your 25mg  tabs twice a day)

## 2017-07-26 ENCOUNTER — Other Ambulatory Visit (HOSPITAL_COMMUNITY): Payer: Self-pay | Admitting: *Deleted

## 2017-07-26 MED ORDER — APIXABAN 5 MG PO TABS: 5.0000 mg | ORAL_TABLET | Freq: Two times a day (BID) | ORAL | 3 refills | Status: DC

## 2017-07-26 MED ORDER — APIXABAN 5 MG PO TABS
5.0000 mg | ORAL_TABLET | Freq: Two times a day (BID) | ORAL | 1 refills | Status: DC
Start: 2017-07-26 — End: 2018-09-21

## 2017-08-01 ENCOUNTER — Ambulatory Visit (HOSPITAL_COMMUNITY)
Admission: RE | Admit: 2017-08-01 | Discharge: 2017-08-01 | Disposition: A | Discharge status: Home / Self Care | Payer: Medicare Other | Source: Ambulatory Visit | Attending: Nurse Practitioner | Admitting: Nurse Practitioner

## 2017-08-01 ENCOUNTER — Encounter (HOSPITAL_COMMUNITY): Payer: Self-pay | Admitting: Nurse Practitioner

## 2017-08-01 VITALS — BP 132/76 | HR 109 | Ht 64.0 in | Wt 177.0 lb

## 2017-08-01 DIAGNOSIS — I517 Cardiomegaly: Secondary | ICD-10-CM | POA: Diagnosis not present

## 2017-08-01 DIAGNOSIS — Z79899 Other long term (current) drug therapy: Secondary | ICD-10-CM | POA: Diagnosis not present

## 2017-08-01 DIAGNOSIS — E039 Hypothyroidism, unspecified: Secondary | ICD-10-CM | POA: Diagnosis not present

## 2017-08-01 DIAGNOSIS — Z87891 Personal history of nicotine dependence: Secondary | ICD-10-CM | POA: Insufficient documentation

## 2017-08-01 DIAGNOSIS — I481 Persistent atrial fibrillation: Secondary | ICD-10-CM

## 2017-08-01 DIAGNOSIS — I10 Essential (primary) hypertension: Secondary | ICD-10-CM | POA: Insufficient documentation

## 2017-08-01 DIAGNOSIS — M8580 Other specified disorders of bone density and structure, unspecified site: Secondary | ICD-10-CM | POA: Insufficient documentation

## 2017-08-01 DIAGNOSIS — I4891 Unspecified atrial fibrillation: Secondary | ICD-10-CM | POA: Insufficient documentation

## 2017-08-01 DIAGNOSIS — N815 Vaginal enterocele: Secondary | ICD-10-CM | POA: Diagnosis not present

## 2017-08-01 DIAGNOSIS — R351 Nocturia: Secondary | ICD-10-CM | POA: Diagnosis not present

## 2017-08-01 DIAGNOSIS — Z8551 Personal history of malignant neoplasm of bladder: Secondary | ICD-10-CM | POA: Insufficient documentation

## 2017-08-01 DIAGNOSIS — I4819 Other persistent atrial fibrillation: Secondary | ICD-10-CM

## 2017-08-01 DIAGNOSIS — Z9841 Cataract extraction status, right eye: Secondary | ICD-10-CM | POA: Insufficient documentation

## 2017-08-01 DIAGNOSIS — M5030 Other cervical disc degeneration, unspecified cervical region: Secondary | ICD-10-CM | POA: Insufficient documentation

## 2017-08-01 DIAGNOSIS — M1990 Unspecified osteoarthritis, unspecified site: Secondary | ICD-10-CM | POA: Diagnosis not present

## 2017-08-01 DIAGNOSIS — N8111 Cystocele, midline: Secondary | ICD-10-CM | POA: Diagnosis not present

## 2017-08-01 DIAGNOSIS — Z981 Arthrodesis status: Secondary | ICD-10-CM | POA: Diagnosis not present

## 2017-08-01 MED ORDER — METOPROLOL TARTRATE 50 MG PO TABS: 50.0000 mg | ORAL_TABLET | Freq: Two times a day (BID) | ORAL | 3 refills | Status: DC

## 2017-08-01 MED ORDER — DILTIAZEM HCL ER COATED BEADS 120 MG PO CP24: 120.0000 mg | ORAL_CAPSULE | Freq: Every day | ORAL | 3 refills | Status: DC

## 2017-08-01 NOTE — Progress Notes (Signed)
Primary Care Physician: [name], MD Referring Physician: Ermalinda Barrios, PA-C Cardiology: Dr. Irish Lack (pending establishing 12/6)   Rebecca Norman is a 83 y.o. female with a h/o recent symptoms of palpitations, discussed with PCP, referred to cardiology and monitor was placed. As soon as it was placed, Afib with RVR was detected. She was referred to the afib clinic, 10/26 from [facility] being notified of afib on monitor. She  not aware of being in Afib that day  with v rate of 120 bpm.    No excessive caffeine, no significant snoring, no tobacco but drank 2 glasses of wine a night, asked to reduce to 2 a week. Walks with a cane but no falls. Eliquis 5 mg bid started for a chadsvasc score of at least 4 (weight over 80, but creatine less less 1.5 and weight over 60 kgs) . Lose doe BB started.  F/u in afib clinic, 11/9. She continues to have afib with RVR, tolerating well, but needs more rate control than 12.5 mg bid of metoprolol.   F/u in afib clinic, 11/16, continues in afib. Feels tired. Weight is stable. Continues on eliquis  without missed doses. HR is better controlled but still not optimal reviewing home HR's, a lot of HR's are around 110 bpm. BB increased to 50 mg bid.  F/u afib clinic, 11/27, even on 50 mg metoprolol she is still showing difficult to rate control with  a v rate of 109 ms.Conitues on eliquis 5 mg bid. She has now been on DOAC x 4 weeks.  Today, she denies symptoms of palpitations, chest pain, shortness of breath, orthopnea, PND, lower extremity edema, dizziness, presyncope, syncope, or neurologic sequela. The patient is tolerating medications without difficulties and is otherwise without complaint today.   Past Medical History:  Diagnosis Date  . DDD (degenerative disc disease), cervical    severe/  auto fusion c2-c5  . Diverticulosis   . Hemorrhoids   . History of adenomatous polyp of colon    1995  adenomatous polyp/  1997 & 2003  hyperplastic polyp's  .  History of bladder cancer urologist-  dr [name]   s/p  TURBT's  . History of cancer of ureter   . Hypertension   . Hypothyroid   . Nocturia   . Osteoarthritis   . Osteopenia    Past Surgical History:  Procedure Laterality Date  . BACK SURGERY    . CATARACT EXTRACTION W/ INTRAOCULAR LENS  IMPLANT, BILATERAL Bilateral   . CYSTOSCOPY W/ RETROGRADES  10/03/2011   Procedure: CYSTOSCOPY WITH RETROGRADE PYELOGRAM;  Surgeon: Molli Hazard, MD;  Location: [facility];  Service: Urology;;  . Consuela Mimes W/ RETROGRADES Bilateral 06/17/2016   Procedure: CYSTOSCOPY WITH RETROGRADE PYELOGRAM;  Surgeon: Alexis Frock, MD;  Location: [facility];  Service: Urology;  Laterality: Bilateral;  . CYSTOSCOPY WITH BIOPSY N/A 06/17/2016   Procedure: CYSTOSCOPY WITH BIOPSY AND FULGERATION;  Surgeon: Alexis Frock, MD;  Location: [facility];  Service: Urology;  Laterality: N/A;  . LAMINECTOMY  2009   L3 - 4  . TRANSURETHRAL RESECTION OF BLADDER  x3 --  2009; 2010; 06-29-2010  . TRANSURETHRAL RESECTION OF BLADDER TUMOR  10/03/2011   Procedure: TRANSURETHRAL RESECTION OF BLADDER TUMOR (TURBT);  Surgeon: Molli Hazard, MD;  Location: [facility];  Service: Urology;  Laterality: N/A;    Current Outpatient Medications  Medication Sig Dispense Refill  . acetaminophen (TYLENOL) 500 MG tablet Take 500 mg by mouth  every 6 (six) hours as needed.    Marland Kitchen apixaban (ELIQUIS) 5 MG TABS tablet Take 1 tablet (5 mg total) by mouth 2 (two) times daily. 180 tablet 1  . ipratropium (ATROVENT) 0.06 % nasal spray Place 2 sprays into both nostrils 3 (three) times daily as needed for rhinitis.    . metoprolol tartrate (LOPRESSOR) 50 MG tablet Take 1 tablet (50 mg total) by mouth 2 (two) times daily. 60 tablet 3  . SYNTHROID 50 MCG tablet     . diltiazem (CARDIZEM CD) 120 MG 24 hr capsule Take 1 capsule (120 mg total) by mouth daily. 30 capsule 3   No current  facility-administered medications for this encounter.     Allergies  Allergen Reactions  . Flagyl [Metronidazole] Hives  . Lisinopril Cough    Social History   Socioeconomic History  . Marital status: Widowed    Spouse name: Not on file  . Number of children: Not on file  . Years of education: Not on file  . Highest education level: Not on file  Social Needs  . Financial resource strain: Not on file  . Food insecurity - worry: Not on file  . Food insecurity - inability: Not on file  . Transportation needs - medical: Not on file  . Transportation needs - non-medical: Not on file  Occupational History  . Occupation: Retired  Tobacco Use  . Smoking status: Former Smoker    Years: 10.00    Types: Cigarettes    Last attempt to quit: 09/25/2005    Years since quitting: 11.8  . Smokeless tobacco: Never Used  Substance and Sexual Activity  . Alcohol use: Yes  . Drug use: No  . Sexual activity: Not on file  Other Topics Concern  . Not on file  Social History Narrative  . Not on file    Family History  Problem Relation Age of Onset  . Breast cancer Sister   . Stomach cancer Sister   . Hernia Sister 42       HERNIA SURGERY COMPLICATIONS  . Colon cancer Neg Hx     ROS- All systems are reviewed and negative except as per the HPI above  Physical Exam: Vitals:   08/01/17 1328  BP: 132/76  Pulse: (!) 109  SpO2: 92%  Weight: 177 lb (80.3 kg)  Height: 5\' 4"  (1.626 m)   Wt Readings from Last 3 Encounters:  08/01/17 177 lb (80.3 kg)  07/21/17 175 lb (79.4 kg)  07/14/17 175 lb (79.4 kg)    Labs: Lab Results  Component Value Date   NA 140 06/17/2016   K 4.1 06/17/2016   CL 100 (L) 06/17/2016   CO2 32 12/30/2008   GLUCOSE 102 (H) 06/17/2016   BUN 20 06/17/2016   CREATININE 0.80 06/17/2016   CALCIUM 9.6 12/30/2008   Lab Results  Component Value Date   INR 1.0 05/28/2008   No results found for: CHOL, HDL, LDLCALC, TRIG   GEN- The patient is well appearing,  alert and oriented x 3 today.   Head- normocephalic, atraumatic Eyes-  Sclera clear, conjunctiva pink Ears- hearing intact Oropharynx- clear Neck- supple, no JVP Lymph- no cervical lymphadenopathy Lungs- Clear to ausculation bilaterally, normal work of breathing Heart- irregular rate and rhythm, no murmurs, rubs or gallops, PMI not laterally displaced GI- soft, NT, ND, + BS Extremities- no clubbing, cyanosis, or edema MS- no significant deformity or atrophy Skin- no rash or lesion Psych- euthymic mood, full affect Neuro- strength and  sensation are intact  EKG-afib with RVR at 109  bpm, qrs int 98 ms, qtc 398 ms Epic records reviewed Labs reviewed from PCP in September, TSH normal range  Echo-Study Conclusions  - Left ventricle: The cavity size was normal. Wall thickness was   increased in a pattern of mild LVH. Systolic function was normal.   The estimated ejection fraction was in the range of 60% to 65%.   The study is not technically sufficient to allow evaluation of LV   diastolic function. - Aortic valve: There was trivial regurgitation. Valve area (VTI):   2.97 cm^2. Valve area (Vmax): 2.7 cm^2. Valve area (Vmean): 3.18   cm^2. - Mitral valve: Severely calcified annulus. Valve area by pressure   half-time: 2.14 cm^2. Valve area by continuity equation (using   LVOT flow): 3.21 cm^2. - Left atrium: The atrium was moderately to severely dilated. 52 mm - Atrial septum: No defect or patent foramen ovale was identified.    Assessment and Plan: 1. New onset afib with RVR Options to manage afib reviewed First option- continuing with rate control  with BB and add CCB Second option- plan on cardioversion alone, but I have doubt that she would stay in rhythm with left atrial size of 52 mm 3rd option- start amiodarone at 200 mg bid and try cardioversion after one month 4th option AV nodal ablation with PPM She is really wanting to be conservative in approach and would like to  try additional rate control She is currently only taking 1/2 tab of losartan/hct and for now will hold with adding 120 mg Cardizem daily Reduce wine to no more than 2 glasses a week     2. Chadsvasc score of at least 4 Denies bleeding risk Bleeding precautions discussed Continue eliquis 5 mg bid, labs obtained form PCP that shows a creatinine of 0.75 on 05/11/17 Use tylenol for pain if needed instead of motrin   F/u with Dr. Irish Lack 12/6 to get established  Butch Penny C. Carroll, [facility] [address] [place], [place] [address] [phone]

## 2017-08-01 NOTE — Patient Instructions (Addendum)
Hold losartan/hctz Start cardizem 120mg  once a day

## 2017-08-04 ENCOUNTER — Telehealth (HOSPITAL_COMMUNITY): Payer: Self-pay | Admitting: *Deleted

## 2017-08-04 NOTE — Telephone Encounter (Signed)
Pt called back to let us know that after taking her medications her heart rate has lowered to 99 and her BP 147/102.  Per Roderic Palau, NP, pt advised to continue to take her medications on schedule and monitor.  Pt has appt with Varanasi 12/6.  Pt understood

## 2017-08-04 NOTE — Telephone Encounter (Signed)
Pt cld this morning with concerns of HR of 124 and BP of 151/112.  Pt states she had not taken her medications this morning, or eaten.  She has had coffee and a cookie so far.  She reports being very jittery.  Pt was advised to take her diltiazem and Metoprolol with food and to drink water.  Pt was advised to please call back in 2 hours so that we can check her HR and BP again and see if she needs to come in for visit.  Pt understood.

## 2017-08-06 ENCOUNTER — Encounter (HOSPITAL_COMMUNITY): Payer: Self-pay | Admitting: Emergency Medicine

## 2017-08-06 ENCOUNTER — Inpatient Hospital Stay (HOSPITAL_COMMUNITY)
Admission: EM | Admit: 2017-08-06 | Discharge: 2017-08-11 | DRG: 308 | Disposition: A | Discharge status: Home / Self Care | Payer: Medicare Other | Attending: Cardiology | Admitting: Cardiology

## 2017-08-06 ENCOUNTER — Emergency Department (HOSPITAL_COMMUNITY): Payer: Medicare Other

## 2017-08-06 DIAGNOSIS — R0602 Shortness of breath: Secondary | ICD-10-CM | POA: Diagnosis not present

## 2017-08-06 DIAGNOSIS — Z87891 Personal history of nicotine dependence: Secondary | ICD-10-CM | POA: Diagnosis not present

## 2017-08-06 DIAGNOSIS — Z888 Allergy status to other drugs, medicaments and biological substances status: Secondary | ICD-10-CM

## 2017-08-06 DIAGNOSIS — Z79899 Other long term (current) drug therapy: Secondary | ICD-10-CM

## 2017-08-06 DIAGNOSIS — I481 Persistent atrial fibrillation: Secondary | ICD-10-CM | POA: Diagnosis not present

## 2017-08-06 DIAGNOSIS — Z7901 Long term (current) use of anticoagulants: Secondary | ICD-10-CM | POA: Diagnosis not present

## 2017-08-06 DIAGNOSIS — F419 Anxiety disorder, unspecified: Secondary | ICD-10-CM | POA: Diagnosis present

## 2017-08-06 DIAGNOSIS — I959 Hypotension, unspecified: Secondary | ICD-10-CM | POA: Diagnosis present

## 2017-08-06 DIAGNOSIS — Z8551 Personal history of malignant neoplasm of bladder: Secondary | ICD-10-CM | POA: Diagnosis not present

## 2017-08-06 DIAGNOSIS — M8580 Other specified disorders of bone density and structure, unspecified site: Secondary | ICD-10-CM | POA: Diagnosis present

## 2017-08-06 DIAGNOSIS — E039 Hypothyroidism, unspecified: Secondary | ICD-10-CM | POA: Diagnosis present

## 2017-08-06 DIAGNOSIS — Z9841 Cataract extraction status, right eye: Secondary | ICD-10-CM

## 2017-08-06 DIAGNOSIS — Z820 Family history of epilepsy and other diseases of the nervous system: Secondary | ICD-10-CM | POA: Diagnosis not present

## 2017-08-06 DIAGNOSIS — R05 Cough: Secondary | ICD-10-CM | POA: Diagnosis not present

## 2017-08-06 DIAGNOSIS — Z803 Family history of malignant neoplasm of breast: Secondary | ICD-10-CM

## 2017-08-06 DIAGNOSIS — I4891 Unspecified atrial fibrillation: Secondary | ICD-10-CM | POA: Diagnosis not present

## 2017-08-06 DIAGNOSIS — I5031 Acute diastolic (congestive) heart failure: Secondary | ICD-10-CM | POA: Diagnosis not present

## 2017-08-06 DIAGNOSIS — C679 Malignant neoplasm of bladder, unspecified: Secondary | ICD-10-CM | POA: Diagnosis not present

## 2017-08-06 DIAGNOSIS — I110 Hypertensive heart disease with heart failure: Secondary | ICD-10-CM | POA: Diagnosis present

## 2017-08-06 DIAGNOSIS — Z800 Family history of malignant neoplasm of digestive organs: Secondary | ICD-10-CM | POA: Diagnosis not present

## 2017-08-06 DIAGNOSIS — Z86010 Personal history of colonic polyps: Secondary | ICD-10-CM | POA: Diagnosis not present

## 2017-08-06 DIAGNOSIS — Z9842 Cataract extraction status, left eye: Secondary | ICD-10-CM | POA: Diagnosis not present

## 2017-08-06 DIAGNOSIS — I482 Chronic atrial fibrillation, unspecified: Secondary | ICD-10-CM | POA: Diagnosis present

## 2017-08-06 DIAGNOSIS — Z79890 Hormone replacement therapy (postmenopausal): Secondary | ICD-10-CM | POA: Diagnosis not present

## 2017-08-06 DIAGNOSIS — Z881 Allergy status to other antibiotic agents status: Secondary | ICD-10-CM | POA: Diagnosis not present

## 2017-08-06 DIAGNOSIS — R002 Palpitations: Secondary | ICD-10-CM | POA: Diagnosis present

## 2017-08-06 DIAGNOSIS — I10 Essential (primary) hypertension: Secondary | ICD-10-CM | POA: Diagnosis present

## 2017-08-06 DIAGNOSIS — Z961 Presence of intraocular lens: Secondary | ICD-10-CM | POA: Diagnosis present

## 2017-08-06 LAB — BASIC METABOLIC PANEL
ANION GAP: 7 (ref 5–15)
BUN: 17 mg/dL (ref 6–20)
CALCIUM: 8.9 mg/dL (ref 8.9–10.3)
CO2: 24 mmol/L (ref 22–32)
Chloride: 107 mmol/L (ref 101–111)
Creatinine, Ser: 0.84 mg/dL (ref 0.44–1.00)
GFR, EST NON AFRICAN AMERICAN: 59 mL/min — AB (ref >60)
GLUCOSE: 110 mg/dL — AB (ref 65–99)
POTASSIUM: 4.5 mmol/L (ref 3.5–5.1)
Sodium: 138 mmol/L (ref 135–145)

## 2017-08-06 LAB — TSH: TSH: 2.929 u[IU]/mL (ref 0.350–4.500)

## 2017-08-06 LAB — I-STAT TROPONIN, ED: TROPONIN I, POC: 0 ng/mL (ref 0.00–0.08)

## 2017-08-06 LAB — CBC
HEMATOCRIT: 41.7 % (ref 36.0–46.0)
HEMOGLOBIN: 13.5 g/dL (ref 12.0–15.0)
MCH: 31.3 pg (ref 26.0–34.0)
MCHC: 32.4 g/dL (ref 30.0–36.0)
MCV: 96.5 fL (ref 78.0–100.0)
Platelets: 204 10*3/uL (ref 150–400)
RBC: 4.32 MIL/uL (ref 3.87–5.11)
RDW: 13.7 % (ref 11.5–15.5)
WBC: 8 10*3/uL (ref 4.0–10.5)

## 2017-08-06 LAB — BRAIN NATRIURETIC PEPTIDE: B Natriuretic Peptide: 1080.8 pg/mL — ABNORMAL HIGH (ref 0.0–100.0)

## 2017-08-06 MED ORDER — POLYVINYL ALCOHOL 1.4 % OP SOLN: 1.0000 [drp] | Freq: Every day | OPHTHALMIC | Status: DC | PRN

## 2017-08-06 MED ORDER — LEVOTHYROXINE SODIUM 50 MCG PO TABS
50.0000 ug | ORAL_TABLET | Freq: Every day | ORAL | Status: DC
  Administered 2017-08-08 – 2017-08-11 (×4): 50 ug via ORAL
  Filled 2017-08-06 (×6): qty 1

## 2017-08-06 MED ORDER — SODIUM CHLORIDE 0.9 % IV BOLUS (SEPSIS)
500.0000 mL | Freq: Once | INTRAVENOUS | Status: AC
  Administered 2017-08-06: 500 mL via INTRAVENOUS

## 2017-08-06 MED ORDER — SALINE SPRAY 0.65 % NA SOLN: 1.0000 | Freq: Every day | NASAL | Status: DC | PRN

## 2017-08-06 MED ORDER — SODIUM CHLORIDE 0.9% FLUSH
3.0000 mL | Freq: Two times a day (BID) | INTRAVENOUS | Status: DC
  Administered 2017-08-06 – 2017-08-09 (×4): 3 mL via INTRAVENOUS

## 2017-08-06 MED ORDER — APIXABAN 5 MG PO TABS
5.0000 mg | ORAL_TABLET | Freq: Two times a day (BID) | ORAL | Status: DC
  Administered 2017-08-07 – 2017-08-11 (×10): 5 mg via ORAL
  Filled 2017-08-06 (×11): qty 1

## 2017-08-06 MED ORDER — METOPROLOL TARTRATE 50 MG PO TABS
50.0000 mg | ORAL_TABLET | Freq: Two times a day (BID) | ORAL | Status: DC
  Administered 2017-08-07 – 2017-08-09 (×6): 50 mg via ORAL
  Filled 2017-08-06 (×2): qty 1
  Filled 2017-08-06: qty 2
  Filled 2017-08-06 (×3): qty 1

## 2017-08-06 MED ORDER — SODIUM CHLORIDE 0.9 % IV BOLUS (SEPSIS): 500.0000 mL | Freq: Once | INTRAVENOUS | Status: DC

## 2017-08-06 MED ORDER — ACETAMINOPHEN 500 MG PO TABS
500.0000 mg | ORAL_TABLET | Freq: Four times a day (QID) | ORAL | Status: DC | PRN
  Administered 2017-08-09: 500 mg via ORAL
  Filled 2017-08-06: qty 1

## 2017-08-06 MED ORDER — SODIUM CHLORIDE 0.9 % IV SOLN
250.0000 mL | INTRAVENOUS | Status: DC
  Administered 2017-08-09: 13:00:00 via INTRAVENOUS

## 2017-08-06 MED ORDER — SODIUM CHLORIDE 0.9% FLUSH: 3.0000 mL | INTRAVENOUS | Status: DC | PRN

## 2017-08-06 MED ORDER — DILTIAZEM HCL ER COATED BEADS 120 MG PO CP24
120.0000 mg | ORAL_CAPSULE | Freq: Every day | ORAL | Status: DC
  Administered 2017-08-07: 120 mg via ORAL
  Filled 2017-08-06: qty 1

## 2017-08-06 NOTE — ED Notes (Signed)
Hospital bed ordered for pt.

## 2017-08-06 NOTE — H&P (Signed)
Cardiology Admission History and Physical:   Patient ID: Rebecca Norman; MRN: [id number]; DOB: 27-May-1925   Admission date: 08/06/2017  Primary Care Provider: [name], MD Primary Cardiologist: Irish Lack (has not seen yet, appointment December 6) Primary Electrophysiologist:  n/a  Chief Complaint:  Fatigue  Patient Profile:   Rebecca Norman is a 83 y.o. female with a history of persistent atrial fibrillation with difficult ventricular rate control, presenting today with complaints of worsening fatigue  History of Present Illness:   Rebecca Norman has been seen frequently by Roderic Palau in the A. fib clinic. She has been receiving metoprolol in increasing doses over the last several weeks. She started treatment with diltiazem roughly a week ago for persistent rapid ventricular rate. Since that time she has actually noticed worsening exercise tolerance. She claims just weakness and fatigue, but appear short of breath while sitting in bed.  She has also noticed worsening lower extremity edema over the last few days. She has not weighed herself recently and is uncertain whether she has gained any weight. She denies orthopnea or PND. She does describe difficulty going back to sleep last night after going to the restroom. She did not sleep at all after 2 AM.  She is a remarkably healthy fit and independent 83 year old. She still drives. She proudly declares that she has never fallen even once. She occasionally uses a cane. Symptoms of orthostatic dizziness when she first stands up, but has learned to manage this well.  She has been conscientious with twice-daily treatment with Eliquis 5 mg over the last month. She has never had a stroke or TIA. She denies any bleeding problems.  Her echo shows normal left ventricular systolic function, moderate to severely dilated left atrium, and systolic down to 5.2 cm, no significant valvular abnormalities.   Past Medical History:  Diagnosis Date    . DDD (degenerative disc disease), cervical    severe/  auto fusion c2-c5  . Diverticulosis   . Hemorrhoids   . History of adenomatous polyp of colon    1995  adenomatous polyp/  1997 & 2003  hyperplastic polyp's  . History of bladder cancer urologist-  dr [name]   s/p  TURBT's  . History of cancer of ureter   . Hypertension   . Hypothyroid   . Nocturia   . Osteoarthritis   . Osteopenia     Past Surgical History:  Procedure Laterality Date  . BACK SURGERY    . CATARACT EXTRACTION W/ INTRAOCULAR LENS  IMPLANT, BILATERAL Bilateral   . CYSTOSCOPY W/ RETROGRADES  10/03/2011   Procedure: CYSTOSCOPY WITH RETROGRADE PYELOGRAM;  Surgeon: Molli Hazard, MD;  Location: [facility];  Service: Urology;;  . Consuela Mimes W/ RETROGRADES Bilateral 06/17/2016   Procedure: CYSTOSCOPY WITH RETROGRADE PYELOGRAM;  Surgeon: Alexis Frock, MD;  Location[facility];  Service: Urology;  Laterality: Bilateral;  . CYSTOSCOPY WITH BIOPSY N/A 06/17/2016   Procedure: CYSTOSCOPY WITH BIOPSY AND FULGERATION;  Surgeon: Alexis Frock, MD;  Location: [facility];  Service: Urology;  Laterality: N/A;  . LAMINECTOMY  2009   L3 - 4  . TRANSURETHRAL RESECTION OF BLADDER  x3 --  2009; 2010; 06-29-2010  . TRANSURETHRAL RESECTION OF BLADDER TUMOR  10/03/2011   Procedure: TRANSURETHRAL RESECTION OF BLADDER TUMOR (TURBT);  Surgeon: Molli Hazard, MD;  Location: [facility];  Service: Urology;  Laterality: N/A;     Medications Prior to Admission: Prior to Admission medications   Medication  Sig Start Date End Date Taking? Authorizing Provider  acetaminophen (TYLENOL) 500 MG tablet Take 500 mg by mouth every 6 (six) hours as needed for headache (pain).    Yes [provider]  apixaban (ELIQUIS) 5 MG TABS tablet Take 1 tablet (5 mg total) by mouth 2 (two) times daily. 07/26/17  Yes Sherran Needs, NP  diltiazem (CARDIZEM CD) 120 MG 24 hr  capsule Take 1 capsule (120 mg total) by mouth daily. 08/01/17 08/01/18 Yes Sherran Needs, NP  levothyroxine (SYNTHROID, LEVOTHROID) 50 MCG tablet Take 50 mcg by mouth daily before breakfast.   Yes [provider]  metoprolol tartrate (LOPRESSOR) 50 MG tablet Take 1 tablet (50 mg total) by mouth 2 (two) times daily. 08/01/17 10/30/17 Yes Sherran Needs, NP  polyvinyl alcohol (ARTIFICIAL TEARS) 1.4 % ophthalmic solution Place 1 drop into both eyes daily as needed for dry eyes (itching).   Yes [provider]  sodium chloride (OCEAN) 0.65 % SOLN nasal spray Place 1 spray into both nostrils daily as needed for congestion.   Yes [provider]     Allergies:    Allergies  Allergen Reactions  . Flagyl [Metronidazole] Hives  . Lisinopril Cough    Social History:   Social History   Socioeconomic History  . Marital status: Widowed    Spouse name: Not on file  . Number of children: Not on file  . Years of education: Not on file  . Highest education level: Not on file  Social Needs  . Financial resource strain: Not on file  . Food insecurity - worry: Not on file  . Food insecurity - inability: Not on file  . Transportation needs - medical: Not on file  . Transportation needs - non-medical: Not on file  Occupational History  . Occupation: Retired  Tobacco Use  . Smoking status: Former Smoker    Years: 10.00    Types: Cigarettes    Last attempt to quit: 09/25/2005    Years since quitting: 11.8  . Smokeless tobacco: Never Used  Substance and Sexual Activity  . Alcohol use: Yes  . Drug use: No  . Sexual activity: Not on file  Other Topics Concern  . Not on file  Social History Narrative  . Not on file    Family History:  Younger sister had Alzheimer's disease The patient's family history includes Breast cancer in her sister; Hernia (age of onset: 62) in her sister; Stomach cancer in her sister. There is no history of Colon cancer.    ROS:  Please  see the history of present illness.  All other ROS reviewed and negative.     Physical Exam/Data:   Vitals:   08/06/17 1400 08/06/17 1445 08/06/17 1515 08/06/17 1615  BP: (!) 128/97 (!) 152/84 (!) 124/105 (!) 134/58  Pulse: (!) 117 95 96 (!) 145  Resp: 20  (!) 22 (!) 24  Temp:      TempSrc:      SpO2: (!) 88% 92% 92% 94%  Weight:      Height:       No intake or output data in the 24 hours ending 08/06/17 1638 Filed Weights   08/06/17 1109  Weight: 177 lb (80.3 kg)   Body mass index is 30.38 kg/m.  General:  Well nourished, well developed, in no acute distress HEENT: normal Lymph: no adenopathy Neck: 8-9 centimeters JVD Endocrine:  No thryomegaly Vascular: No carotid bruits; FA pulses 2+ bilaterally without bruits  Cardiac:  normal S1, S2; irregular; 2/6 early peaking systolic ejection murmur is heard primarily at the aortic focus, no diastolic murmur  Lungs:  clear to auscultation bilaterally, no wheezing, rhonchi or rales  Abd: soft, nontender, no hepatomegaly  Ext: Symmetrical 2+ ankle and lower half of her calves show edema Musculoskeletal:  No deformities, BUE and BLE strength normal and equal Skin: warm and dry  Neuro:  CNs 2-12 intact, no focal abnormalities noted Psych:  Normal affect    EKG:  The ECG that was done was personally reviewed and demonstrates atrial fibrillation rapid ventricular response  Relevant CV Studies: Echo 08/29/2017 - Left ventricle: The cavity size was normal. Wall thickness was   increased in a pattern of mild LVH. Systolic function was normal.   The estimated ejection fraction was in the range of 60% to 65%.   The study is not technically sufficient to allow evaluation of LV   diastolic function. - Aortic valve: There was trivial regurgitation. Valve area (VTI):   2.97 cm^2. Valve area (Vmax): 2.7 cm^2. Valve area (Vmean): 3.18   cm^2. - Mitral valve: Severely calcified annulus. Valve area by pressure   half-time: 2.14 cm^2. Valve  area by continuity equation (using   LVOT flow): 3.21 cm^2. - Left atrium: The atrium was moderately to severely dilated. - Atrial septum: No defect or patent foramen ovale was identified.   Laboratory Data:  Chemistry Recent Labs  Lab 08/06/17 1114  NA 138  K 4.5  CL 107  CO2 24  GLUCOSE 110*  BUN 17  CREATININE 0.84  CALCIUM 8.9  GFRNONAA 59*  GFRAA >60  ANIONGAP 7    No results for input(s): PROT, ALBUMIN, AST, ALT, ALKPHOS, BILITOT in the last 168 hours. Hematology Recent Labs  Lab 08/06/17 1114  WBC 8.0  RBC 4.32  HGB 13.5  HCT 41.7  MCV 96.5  MCH 31.3  MCHC 32.4  RDW 13.7  PLT 204   Cardiac EnzymesNo results for input(s): TROPONINI in the last 168 hours.  Recent Labs  Lab 08/06/17 1127  TROPIPOC 0.00    BNP Recent Labs  Lab 08/06/17 1500  BNP 1,080.8*    DDimer No results for input(s): DDIMER in the last 168 hours.  Radiology/Studies:  Dg Chest 2 View  Result Date: 08/06/2017 CLINICAL DATA:  Shortness breath and cough. EXAM: CHEST  2 VIEW COMPARISON:  Chest x-ray dated June 29, 2010. FINDINGS: Moderate cardiomegaly, similar to prior study. Diffusely increased interstitial markings. Atherosclerotic calcification of the aortic arch. Small right pleural effusion. Bibasilar atelectasis. No pneumothorax. No acute osseous abnormality. IMPRESSION: Moderate cardiomegaly with pulmonary interstitial edema and small right pleural effusion. Electronically Signed   By: Titus Dubin M.D.   On: 08/06/2017 12:11    Assessment and Plan:   1. AFib: Ventricular rate control is now adequate, at least at rest. Despite this she is symptomatic and has evidence of volume overload. I think she has diastolic heart failure not withstanding the reassuring findings on her recent echo. Will admit for diuretics and plan for cardioversion. She has been taking anticoagulation without fail now for over 4 weeks. She does have a severely dilated left atrium and may not hold in  normal rhythm without an antiarrhythmic. We talked a little bit about options for antiarrhythmic therapy, but did not reach a decision about this today. She might just want to see what happens with a first attempt at cardioversion. CHADSVasc 4(age 72, CHF, gender). 2. CHF: She appears tachypneic at rest  and has lower extremity edema and elevated jugular venous pulsations. Will administer diuretics today. Despite preserved left ventricular systolic function, it is possible that she will not tolerate a calcium channel blocker. It's also possible that she had a long period of atrial fibrillation with RVR and that this is the cause of her decompensation.  Severity of Illness: The appropriate patient status for this patient is INPATIENT. Inpatient status is judged to be reasonable and necessary in order to provide the required intensity of service to ensure the patient's safety. The patient's presenting symptoms, physical exam findings, and initial radiographic and laboratory data in the context of their chronic comorbidities is felt to place them at high risk for further clinical deterioration. Furthermore, it is not anticipated that the patient will be medically stable for discharge from the hospital within 2 midnights of admission. The following factors support the patient status of inpatient.   " The patient's presenting symptoms include fatigue, shortness of breath, edema. " The worrisome physical exam findings include edema, tachypnea, elevated jugular venous pulsations. " The initial radiographic and laboratory data are worrisome because of pulmonary interstitial edema on chest x-ray, right pleural effusion. " The chronic co-morbidities include advanced age.   * I certify that at the point of admission it is my clinical judgment that the patient will require inpatient hospital care spanning beyond 2 midnights from the point of admission due to high intensity of service, high risk for further  deterioration and high frequency of surveillance required.*    For questions or updates, please contact [place] Please consult [email or web address] for contact info under Cardiology/STEMI.    Signed, Sanda Klein, MD  08/06/2017 4:38 PM

## 2017-08-06 NOTE — ED Notes (Signed)
Pt in an earlier conversation stated that she receives PM meds; admitting provider paged earlier with no response. Dr. Kenton Kingfisher contacted to place orders for pt home meds. Per Dr. Kenton Kingfisher, pt can have sips with medications after midnight.

## 2017-08-06 NOTE — ED Provider Notes (Signed)
[facility]ERGENCY DEPARTMENT Provider Note   CSN: [id number] Arrival date & time: 08/06/17  1045  History   Chief Complaint Chief Complaint  Patient presents with  . Shortness of Breath  . Weakness  . Anxiety   HPI Rebecca Norman is a 83 y.o. female with a PMHx significant with HTN and Atrial Fibrillation who presented to the ED with c/o nausea, gait instability, cough and exertional faitgue since starting Cardizem and increasing her metoprolol on 11/27. She states in October she was diagnosed with Atrial fibrillation and since that time has had multiple problems. She takes her medication about an hour after eating. She otherwise feels well and her symptoms are not persistent. She states that her symptoms primarily occur when she goes from the seated position to standing and usually resolve after a few minutes. She is not on any diuretics and has been eating/drinking without difficulty. She had an echocardiogram done in 06/2017 that illustrated an EF of 60-65% with LAE and pulmonic and tricuspid regurgitation. She has never had a MI or CVA. Denies any recent illness, fever/chills, SOB, diarrhea, hematuria, dysuria, polyuria, abdominal pain, chest pain, HA, visual changes, dizziness/vertigo.   Past Medical History:  Diagnosis Date  . DDD (degenerative disc disease), cervical    severe/  auto fusion c2-c5  . Diverticulosis   . Hemorrhoids   . History of adenomatous polyp of colon    1995  adenomatous polyp/  1997 & 2003  hyperplastic polyp's  . History of bladder cancer urologist-  dr [name]   s/p  TURBT's  . History of cancer of ureter   . Hypertension   . Hypothyroid   . Nocturia   . Osteoarthritis   . Osteopenia    Patient Active Problem List   Diagnosis Date Noted  . Hypothyroid 06/13/2017  . Palpitations 06/13/2017  . Abnormal EKG 06/13/2017  . Heart murmur 06/13/2017  . S/P laminectomy 07/10/2013  . HTN (hypertension) 07/10/2013  . BLADDER CANCER  12/17/2008  . DIVERTICULOSIS OF COLON 12/12/2008  . COLONIC POLYPS, HYPERPLASTIC, HX OF 12/12/2008   Past Surgical History:  Procedure Laterality Date  . BACK SURGERY    . CATARACT EXTRACTION W/ INTRAOCULAR LENS  IMPLANT, BILATERAL Bilateral   . CYSTOSCOPY W/ RETROGRADES  10/03/2011   Procedure: CYSTOSCOPY WITH RETROGRADE PYELOGRAM;  Surgeon: Molli Hazard, MD;  Location: [facility];  Service: Urology;;  . Consuela Mimes W/ RETROGRADES Bilateral 06/17/2016   Procedure: CYSTOSCOPY WITH RETROGRADE PYELOGRAM;  Surgeon: Alexis Frock, MD;  Location: [facility];  Service: Urology;  Laterality: Bilateral;  . CYSTOSCOPY WITH BIOPSY N/A 06/17/2016   Procedure: CYSTOSCOPY WITH BIOPSY AND FULGERATION;  Surgeon: Alexis Frock, MD;  Location: [facility];  Service: Urology;  Laterality: N/A;  . LAMINECTOMY  2009   L3 - 4  . TRANSURETHRAL RESECTION OF BLADDER  x3 --  2009; 2010; 06-29-2010  . TRANSURETHRAL RESECTION OF BLADDER TUMOR  10/03/2011   Procedure: TRANSURETHRAL RESECTION OF BLADDER TUMOR (TURBT);  Surgeon: Molli Hazard, MD;  Location: [facility];  Service: Urology;  Laterality: N/A;   OB History    No data available     Home Medications    Prior to Admission medications   Medication Sig Start Date End Date Taking? Authorizing Provider  acetaminophen (TYLENOL) 500 MG tablet Take 500 mg by mouth every 6 (six) hours as needed.    [provider]  apixaban (ELIQUIS) 5 MG TABS tablet  Take 1 tablet (5 mg total) by mouth 2 (two) times daily. 07/26/17   Sherran Needs, NP  diltiazem (CARDIZEM CD) 120 MG 24 hr capsule Take 1 capsule (120 mg total) by mouth daily. 08/01/17 08/01/18  Sherran Needs, NP  ipratropium (ATROVENT) 0.06 % nasal spray Place 2 sprays into both nostrils 3 (three) times daily as needed for rhinitis.    [provider]  metoprolol tartrate (LOPRESSOR) 50 MG tablet Take 1 tablet  (50 mg total) by mouth 2 (two) times daily. 08/01/17 10/30/17  Sherran Needs, NP  SYNTHROID 50 MCG tablet  12/22/16   [provider]   Family History Family History  Problem Relation Age of Onset  . Breast cancer Sister   . Stomach cancer Sister   . Hernia Sister 70       HERNIA SURGERY COMPLICATIONS  . Colon cancer Neg Hx    Social History Social History   Tobacco Use  . Smoking status: Former Smoker    Years: 10.00    Types: Cigarettes    Last attempt to quit: 09/25/2005    Years since quitting: 11.8  . Smokeless tobacco: Never Used  Substance Use Topics  . Alcohol use: Yes  . Drug use: No   Allergies   Flagyl [metronidazole] and Lisinopril  Review of Systems  All systems reviewed and are negative for acute change except as noted in the HPI.  Physical Exam Updated Vital Signs BP (!) 124/105   Pulse 96   Temp 97.8 F (36.6 C) (Oral)   Resp (!) 22   Ht 5\' 4"  (1.626 m)   Wt 80.3 kg (177 lb)   SpO2 92%   BMI 30.38 kg/m   General: Thin elderly female in no acute distress HENT: Normocephalic, atraumatic, moist mucus membranes  Pulm: Good air movement with no wheezing or crackles  CV: Irregular rhythm, no murmurs, no rubs  Abdomen: Active bowel sounds, soft, no tenderness to palpation  Extremities: Mild pitting edema around the ankles bilaterally  Skin: Warm and dry  Neuro: - Cranial nerves intact bilaterally with no deficits, no nystagmus  - Gross motor strength 5/5 in upper and lower extremities bilaterally  - Sensation to touch intact in the upper and lower extremities  EKG: Rate controlled A-fib  CXR: Good inspiration and penetration, no bony or soft tissue abnormalities, cardiomegaly, and right pleural effusion. Increased pulmonary vascular congestion. No infiltrates or opacities noted.   ED Treatments / Results  Labs (all labs ordered are listed, but only abnormal results are displayed) Labs Reviewed  BASIC METABOLIC PANEL - Abnormal;  Notable for the following components:      Result Value   Glucose, Bld 110 (*)    GFR calc non Af Amer 59 (*)    All other components within normal limits  CBC  BRAIN NATRIURETIC PEPTIDE  TSH  I-STAT TROPONIN, ED   EKG  EKG Interpretation  Date/Time:  Sunday August 06 2017 10:58:53 EST Ventricular Rate:  90 PR Interval:    QRS Duration: 100 QT Interval:  384 QTC Calculation: 469 R Axis:   59 Text Interpretation:  Atrial fibrillation ST & T wave abnormality, consider inferior ischemia ST & T wave abnormality, consider anterolateral ischemia Abnormal ECG No significant change since last tracing Confirmed by Addison Lank [phone]) on 08/06/2017 2:15:55 PM      Radiology Dg Chest 2 View  Result Date: 08/06/2017 CLINICAL DATA:  Shortness breath and cough. EXAM: CHEST  2 VIEW COMPARISON:  Chest x-ray dated June 29, 2010. FINDINGS: Moderate cardiomegaly, similar to prior study. Diffusely increased interstitial markings. Atherosclerotic calcification of the aortic arch. Small right pleural effusion. Bibasilar atelectasis. No pneumothorax. No acute osseous abnormality. IMPRESSION: Moderate cardiomegaly with pulmonary interstitial edema and small right pleural effusion. Electronically Signed   By: Titus Dubin M.D.   On: 08/06/2017 12:11   Procedures Procedures (including critical care time)  Medications Ordered in ED Medications  sodium chloride 0.9 % bolus 500 mL (500 mLs Intravenous New Bag/Given 08/06/17 1537)   Initial Impression / Assessment and Plan / ED Course  I have reviewed the triage vital signs and the nursing notes.  Pertinent labs & imaging results that were available during my care of the patient were reviewed by me and considered in my medical decision making (see chart for details).    Patient presented with exertional fatigue and difficult to control A-fib on Metoprolol 50 mg BID and Cardizem 120 mg QD. She is compliant with her medications. She was recently  seen in the A-fib clinic on 08/01/17 and presented with several options including additional rate control, cardioversion, starting antiarrhythmics (amiodarone), or AV nodal ablation. Patient elected to stick with conservative therapy and add additional rate control (cardizem).   Today she is hemodynamically stable and rate controlled. Cardiology was consulted who recommended admission with plan to attempt cardioversion.The patient was orthostatics positive with a change in HR of >30. She was given a 535mL bolus. BNP later returned >1000. This could be from the secondary to her A-fib vs volume overload.   Plan was discussed with the patient and she agreed.    Final Clinical Impressions(s) / ED Diagnoses   Final diagnoses:  Chronic atrial fibrillation [facility])   ED Discharge Orders    None        Ina Homes, MD 08/06/17 1623    Fatima Blank, MD 08/06/17 [phone]

## 2017-08-06 NOTE — ED Notes (Signed)
Pt transferred over to hospital bed and placed on 2 L Jud. Pt sats remaining between 91-94% on RA. Sats now 95% on 2 L.

## 2017-08-06 NOTE — ED Triage Notes (Signed)
Pt. Stated, I think the new medication I was changed to on Wednesday has caused me to be more nervous and have some SOB  And I have had a cough.  I felt better way before I started taking all this new medication.

## 2017-08-06 NOTE — ED Notes (Signed)
[facility]S called, per Joellen Jersey, EMT, contents of bed pan spilled on floor.

## 2017-08-06 NOTE — ED Notes (Signed)
Heart healthy dinner tray provided.

## 2017-08-06 NOTE — ED Notes (Signed)
Admission orders not yet placed. Dr. Kenton Kingfisher notified to place order.

## 2017-08-07 ENCOUNTER — Other Ambulatory Visit: Payer: Self-pay

## 2017-08-07 LAB — CBC WITH DIFFERENTIAL/PLATELET
Basophils Absolute: 0 10*3/uL (ref 0.0–0.1)
Basophils Relative: 0 %
EOS ABS: 0.2 10*3/uL (ref 0.0–0.7)
EOS PCT: 2 %
HCT: 38 % (ref 36.0–46.0)
Hemoglobin: 12.3 g/dL (ref 12.0–15.0)
LYMPHS ABS: 1 10*3/uL (ref 0.7–4.0)
LYMPHS PCT: 16 %
MCH: 31.6 pg (ref 26.0–34.0)
MCHC: 32.4 g/dL (ref 30.0–36.0)
MCV: 97.7 fL (ref 78.0–100.0)
MONO ABS: 0.8 10*3/uL (ref 0.1–1.0)
Monocytes Relative: 12 %
Neutro Abs: 4.7 10*3/uL (ref 1.7–7.7)
Neutrophils Relative %: 70 %
PLATELETS: 175 10*3/uL (ref 150–400)
RBC: 3.89 MIL/uL (ref 3.87–5.11)
RDW: 14 % (ref 11.5–15.5)
WBC: 6.7 10*3/uL (ref 4.0–10.5)

## 2017-08-07 LAB — BASIC METABOLIC PANEL
Anion gap: 5 (ref 5–15)
BUN: 17 mg/dL (ref 6–20)
CALCIUM: 8.5 mg/dL — AB (ref 8.9–10.3)
CO2: 26 mmol/L (ref 22–32)
CREATININE: 0.87 mg/dL (ref 0.44–1.00)
Chloride: 107 mmol/L (ref 101–111)
GFR calc Af Amer: >60 mL/min (ref >60)
GFR, EST NON AFRICAN AMERICAN: 57 mL/min — AB (ref >60)
GLUCOSE: 95 mg/dL (ref 65–99)
Potassium: 4.1 mmol/L (ref 3.5–5.1)
SODIUM: 138 mmol/L (ref 135–145)

## 2017-08-07 LAB — APTT: aPTT: 35 seconds (ref 24–36)

## 2017-08-07 LAB — PROTIME-INR
INR: 1.43
PROTHROMBIN TIME: 17.3 s — AB (ref 11.4–15.2)

## 2017-08-07 LAB — MAGNESIUM: Magnesium: 1.8 mg/dL (ref 1.7–2.4)

## 2017-08-07 LAB — MRSA PCR SCREENING: MRSA BY PCR: NEGATIVE

## 2017-08-07 MED ORDER — DILTIAZEM HCL 100 MG IV SOLR
5.0000 mg/h | INTRAVENOUS | Status: DC
  Administered 2017-08-07: 5 mg/h via INTRAVENOUS
  Filled 2017-08-07: qty 100

## 2017-08-07 MED ORDER — FUROSEMIDE 10 MG/ML IJ SOLN
40.0000 mg | Freq: Once | INTRAMUSCULAR | Status: AC
  Administered 2017-08-07: 40 mg via INTRAVENOUS
  Filled 2017-08-07: qty 4

## 2017-08-07 MED ORDER — FUROSEMIDE 10 MG/ML IJ SOLN
40.0000 mg | Freq: Two times a day (BID) | INTRAMUSCULAR | Status: DC
  Administered 2017-08-07: 40 mg via INTRAVENOUS
  Filled 2017-08-07: qty 4

## 2017-08-07 NOTE — Progress Notes (Signed)
   No availability for [facility] on 12/4. Cardioversion has been scheduled for 08/09/2017 at 1400 with Dr. Aundra Dubin.   Signed, Erma Heritage, PA-C 08/07/2017, 11:36 AM Pager: [phone]

## 2017-08-07 NOTE — ED Notes (Signed)
Lasix administered. Pt placed on purewick. Cardiology MD at bedside discussing cardioversion with patient and family.

## 2017-08-07 NOTE — Progress Notes (Addendum)
   08/07/17 2300 08/07/17 2329 08/07/17 2332  Vitals  BP (!) 79/61 (!) 95/53 (!) 88/48  MAP (mmHg) 68 (!) 64 (!) 61    Cardizem gtt started at 2252. Blood pressure dropped. Cardizem gtt stopped. MD paged. MD advised to continue Cardizem gtt when BP above 90. Will continue to monitor.

## 2017-08-07 NOTE — ED Notes (Signed)
Received call from Cardiology, pt's cardioversion scheduled for Wednesday, 08/09/17, will be NPO at midnight tomorrow

## 2017-08-07 NOTE — Progress Notes (Signed)
Progress Note  Patient Name: Rebecca Norman Date of Encounter: 08/07/2017  Primary Cardiologist: Dr Irish Lack  Subjective   Complains of dyspnea; no chest pain; has fatigue  Inpatient Medications    Scheduled Meds: . apixaban  5 mg Oral BID  . diltiazem  120 mg Oral Daily  . furosemide  40 mg Intravenous Once  . levothyroxine  50 mcg Oral QAC breakfast  . metoprolol tartrate  50 mg Oral BID  . sodium chloride flush  3 mL Intravenous Q12H   Continuous Infusions: . sodium chloride Stopped (08/06/17 2335)   PRN Meds: acetaminophen, polyvinyl alcohol, sodium chloride, sodium chloride flush   Vital Signs    Vitals:   08/07/17 0730 08/07/17 0815 08/07/17 0834 08/07/17 0934  BP: (!) 123/94 115/82 (!) 139/106 135/74  Pulse:   (!) 104 (!) 110  Resp:  (!) 23 (!) 28   Temp:      TempSrc:      SpO2: 98% 97% 97%   Weight:      Height:        Intake/Output Summary (Last 24 hours) at 08/07/2017 0956 Last data filed at 08/06/2017 1727 Gross per 24 hour  Intake 500 ml  Output -  Net 500 ml   Filed Weights   08/06/17 1109  Weight: 177 lb (80.3 kg)    Telemetry    Atrial fibrillation, rate high normal- Personally Reviewed   Physical Exam   GEN: No acute distress.   Neck: positive JVD Cardiac: irregular Respiratory: diminished BS bases GI: Soft, nontender, non-distended  MS: Trace edema Neuro:  Nonfocal    Labs    Chemistry Recent Labs  Lab 08/06/17 1114 08/07/17 0339  NA 138 138  K 4.5 4.1  CL 107 107  CO2 24 26  GLUCOSE 110* 95  BUN 17 17  CREATININE 0.84 0.87  CALCIUM 8.9 8.5*  GFRNONAA 59* 57*  GFRAA >60 >60  ANIONGAP 7 5     Hematology Recent Labs  Lab 08/06/17 1114 08/07/17 0339  WBC 8.0 6.7  RBC 4.32 3.89  HGB 13.5 12.3  HCT 41.7 38.0  MCV 96.5 97.7  MCH 31.3 31.6  MCHC 32.4 32.4  RDW 13.7 14.0  PLT 204 175     Recent Labs  Lab 08/06/17 1127  TROPIPOC 0.00     BNP Recent Labs  Lab 08/06/17 1500  BNP 1,080.8*        Radiology    Dg Chest 2 View  Result Date: 08/06/2017 CLINICAL DATA:  Shortness breath and cough. EXAM: CHEST  2 VIEW COMPARISON:  Chest x-ray dated June 29, 2010. FINDINGS: Moderate cardiomegaly, similar to prior study. Diffusely increased interstitial markings. Atherosclerotic calcification of the aortic arch. Small right pleural effusion. Bibasilar atelectasis. No pneumothorax. No acute osseous abnormality. IMPRESSION: Moderate cardiomegaly with pulmonary interstitial edema and small right pleural effusion. Electronically Signed   By: Titus Dubin M.D.   On: 08/06/2017 12:11    Patient Profile     83 y.o. female admitted with recent diagnosis of atrial fibrillation, acute diastolic congestive heart failure and fatigue.  Assessment & Plan    1 persistent atrial fibrillation-LV function is normal.  TSH normal.  This is likely the cause of her congestive heart failure and fatigue. CHADSvasc 4. She has been compliant with apixaban for greater than 21 days.  We will plan to proceed with cardioversion tomorrow morning for symptomatic improvement.  She does have significant left atrial enlargement and may not hold sinus  rhythm.  If not would likely add amiodarone and repeat cardioversion.  Continue metoprolol and Cardizem for rate control.  2 acute diastolic congestive heart failure-likely related to atrial fibrillation.  Will diurese with Lasix 40 mg IV twice daily.  Follow renal function.  3 hypertension-continue blood pressure medications and advance as needed.  For questions or updates, please contact [place] Please consult [email or web address] for contact info under Cardiology/STEMI.      Signed, Kirk Ruths, MD  08/07/2017, 9:56 AM

## 2017-08-07 NOTE — ED Notes (Signed)
Upon reviewing patient chart - Cards MD yesterday recommended treatment with lasix IV, BNP 1,080, discussed with PA [place] (cardiology) - lasix orders placed. Pt audibly wheezing, tachypnic, a/o x4 however. A fib up to 120 bpm, pt on 3L nasal cannula. Will continue to monitor.

## 2017-08-07 NOTE — Progress Notes (Signed)
Pt HR 130-140's Afib sustained. MD paged. Verbal order received for Cardizem gtt from on call MD Aundra Dubin. Will continue to monitor.

## 2017-08-08 LAB — CBC
HCT: 40.4 % (ref 36.0–46.0)
Hemoglobin: 12.9 g/dL (ref 12.0–15.0)
MCH: 31 pg (ref 26.0–34.0)
MCHC: 31.9 g/dL (ref 30.0–36.0)
MCV: 97.1 fL (ref 78.0–100.0)
PLATELETS: 210 10*3/uL (ref 150–400)
RBC: 4.16 MIL/uL (ref 3.87–5.11)
RDW: 13.8 % (ref 11.5–15.5)
WBC: 9.3 10*3/uL (ref 4.0–10.5)

## 2017-08-08 LAB — BASIC METABOLIC PANEL
Anion gap: 10 (ref 5–15)
BUN: 23 mg/dL — AB (ref 6–20)
CHLORIDE: 97 mmol/L — AB (ref 101–111)
CO2: 29 mmol/L (ref 22–32)
CREATININE: 0.98 mg/dL (ref 0.44–1.00)
Calcium: 8.5 mg/dL — ABNORMAL LOW (ref 8.9–10.3)
GFR calc Af Amer: 57 mL/min — ABNORMAL LOW (ref >60)
GFR calc non Af Amer: 49 mL/min — ABNORMAL LOW (ref >60)
GLUCOSE: 117 mg/dL — AB (ref 65–99)
Potassium: 3.5 mmol/L (ref 3.5–5.1)
SODIUM: 136 mmol/L (ref 135–145)

## 2017-08-08 MED ORDER — SODIUM CHLORIDE 0.9 % IV SOLN: 250.0000 mL | INTRAVENOUS | Status: DC

## 2017-08-08 MED ORDER — POTASSIUM CHLORIDE CRYS ER 20 MEQ PO TBCR
40.0000 meq | EXTENDED_RELEASE_TABLET | Freq: Once | ORAL | Status: AC
  Administered 2017-08-08: 40 meq via ORAL
  Filled 2017-08-08: qty 2

## 2017-08-08 MED ORDER — SODIUM CHLORIDE 0.9% FLUSH
3.0000 mL | Freq: Two times a day (BID) | INTRAVENOUS | Status: DC
  Administered 2017-08-08 – 2017-08-11 (×6): 3 mL via INTRAVENOUS

## 2017-08-08 MED ORDER — SODIUM CHLORIDE 0.9% FLUSH: 3.0000 mL | INTRAVENOUS | Status: DC | PRN

## 2017-08-08 MED ORDER — FUROSEMIDE 10 MG/ML IJ SOLN
20.0000 mg | Freq: Two times a day (BID) | INTRAMUSCULAR | Status: DC
  Administered 2017-08-08 – 2017-08-09 (×4): 20 mg via INTRAVENOUS
  Filled 2017-08-08 (×4): qty 2

## 2017-08-08 NOTE — Progress Notes (Signed)
Pt had a 2.06 second pause @ 0037 and a 2.12 second pause @ 203. On call MD notified, no new orders received at this time. Will continue to monitor.

## 2017-08-08 NOTE — Progress Notes (Signed)
Progress Note  Patient Name: Rebecca Norman Date of Encounter: 08/08/2017  Primary Cardiologist: Dr Irish Lack  Subjective   Dyspnea with some improvement; no chest pain  Inpatient Medications    Scheduled Meds: . apixaban  5 mg Oral BID  . furosemide  40 mg Intravenous BID  . levothyroxine  50 mcg Oral QAC breakfast  . metoprolol tartrate  50 mg Oral BID  . sodium chloride flush  3 mL Intravenous Q12H   Continuous Infusions: . sodium chloride Stopped (08/06/17 2335)  . diltiazem (CARDIZEM) infusion Stopped (08/07/17 2332)   PRN Meds: acetaminophen, polyvinyl alcohol, sodium chloride, sodium chloride flush   Vital Signs    Vitals:   08/08/17 0045 08/08/17 0212 08/08/17 0410 08/08/17 0413  BP: (!) 84/70 (!) 94/56 119/76   Pulse: (!) 59 66 75 (!) 32  Resp: (!) 25 (!) 26 (!) 22 (!) 24  Temp:   98.4 F (36.9 C)   TempSrc:   Oral   SpO2: 96% 95% 95% 96%  Weight:      Height:        Intake/Output Summary (Last 24 hours) at 08/08/2017 0724 Last data filed at 08/08/2017 0410 Gross per 24 hour  Intake 406.33 ml  Output 2700 ml  Net -2293.67 ml   Filed Weights   08/06/17 1109 08/07/17 1301  Weight: 177 lb (80.3 kg) 178 lb 2.1 oz (80.8 kg)    Telemetry    Atrial fibrillation, rate controlled this AM- Personally Reviewed   Physical Exam   GEN: WD/WN No acute distress.   Neck: supple Cardiac: irregular, no murmur Respiratory: mild exp wheeze GI: Soft, nontender, non-distended, no masses  MS: No edema Neuro: Grossly intact   Labs    Chemistry Recent Labs  Lab 08/06/17 1114 08/07/17 0339 08/08/17 0204  NA 138 138 136  K 4.5 4.1 3.5  CL 107 107 97*  CO2 24 26 29   GLUCOSE 110* 95 117*  BUN 17 17 23*  CREATININE 0.84 0.87 0.98  CALCIUM 8.9 8.5* 8.5*  GFRNONAA 59* 57* 49*  GFRAA >60 >60 57*  ANIONGAP 7 5 10      Hematology Recent Labs  Lab 08/06/17 1114 08/07/17 0339 08/08/17 0204  WBC 8.0 6.7 9.3  RBC 4.32 3.89 4.16  HGB 13.5 12.3 12.9    HCT 41.7 38.0 40.4  MCV 96.5 97.7 97.1  MCH 31.3 31.6 31.0  MCHC 32.4 32.4 31.9  RDW 13.7 14.0 13.8  PLT 204 175 210     Recent Labs  Lab 08/06/17 1127  TROPIPOC 0.00     BNP Recent Labs  Lab 08/06/17 1500  BNP 1,080.8*       Radiology    Dg Chest 2 View  Result Date: 08/06/2017 CLINICAL DATA:  Shortness breath and cough. EXAM: CHEST  2 VIEW COMPARISON:  Chest x-ray dated June 29, 2010. FINDINGS: Moderate cardiomegaly, similar to prior study. Diffusely increased interstitial markings. Atherosclerotic calcification of the aortic arch. Small right pleural effusion. Bibasilar atelectasis. No pneumothorax. No acute osseous abnormality. IMPRESSION: Moderate cardiomegaly with pulmonary interstitial edema and small right pleural effusion. Electronically Signed   By: Titus Dubin M.D.   On: 08/06/2017 12:11    Patient Profile     83 y.o. female admitted with recent diagnosis of atrial fibrillation, acute diastolic congestive heart failure and fatigue.  Assessment & Plan    1 persistent atrial fibrillation-Pt remains in atrial fibrillation this AM; continue cardizem and metoprolol for rate control; continue apixaban. Plan  DCCV tomorrow. If she does not hold sinus, will add amiodarone. Atrial fibrillation likely cause of CHF. LV function is normal.  TSH normal.  Note she has been compliant with apixaban for greater than 21 days.    2 acute diastolic congestive heart failure-likely related to atrial fibrillation.  I/O - 2294. Will decrease lasix to 20 mg BID; follow renal function.   3 hypertension-BP low last PM but improved today; decrease lasix; follow BP and adjust regimen as needed.  For questions or updates, please contact [place] Please consult [email or web address] for contact info under Cardiology/STEMI.      Signed, Kirk Ruths, MD  08/08/2017, 7:24 AM

## 2017-08-09 ENCOUNTER — Encounter (HOSPITAL_COMMUNITY): Payer: Self-pay

## 2017-08-09 ENCOUNTER — Inpatient Hospital Stay (HOSPITAL_COMMUNITY): Payer: Medicare Other | Admitting: Certified Registered Nurse Anesthetist

## 2017-08-09 ENCOUNTER — Encounter (HOSPITAL_COMMUNITY)
Admission: EM | Disposition: A | Discharge status: Home / Self Care | Payer: Self-pay | Source: Home / Self Care | Attending: Cardiovascular Disease

## 2017-08-09 DIAGNOSIS — I4891 Unspecified atrial fibrillation: Secondary | ICD-10-CM

## 2017-08-09 HISTORY — PX: CARDIOVERSION: SHX1299

## 2017-08-09 LAB — CBC
HEMATOCRIT: 42 % (ref 36.0–46.0)
Hemoglobin: 13.5 g/dL (ref 12.0–15.0)
MCH: 31.2 pg (ref 26.0–34.0)
MCHC: 32.1 g/dL (ref 30.0–36.0)
MCV: 97 fL (ref 78.0–100.0)
Platelets: 205 10*3/uL (ref 150–400)
RBC: 4.33 MIL/uL (ref 3.87–5.11)
RDW: 13.8 % (ref 11.5–15.5)
WBC: 10.3 10*3/uL (ref 4.0–10.5)

## 2017-08-09 LAB — BASIC METABOLIC PANEL
Anion gap: 10 (ref 5–15)
BUN: 23 mg/dL — ABNORMAL HIGH (ref 6–20)
CHLORIDE: 97 mmol/L — AB (ref 101–111)
CO2: 30 mmol/L (ref 22–32)
Calcium: 8.7 mg/dL — ABNORMAL LOW (ref 8.9–10.3)
Creatinine, Ser: 0.88 mg/dL (ref 0.44–1.00)
GFR calc non Af Amer: 56 mL/min — ABNORMAL LOW (ref >60)
Glucose, Bld: 105 mg/dL — ABNORMAL HIGH (ref 65–99)
POTASSIUM: 4 mmol/L (ref 3.5–5.1)
SODIUM: 137 mmol/L (ref 135–145)

## 2017-08-09 SURGERY — CARDIOVERSION
Anesthesia: General

## 2017-08-09 MED ORDER — PROPOFOL 10 MG/ML IV BOLUS
INTRAVENOUS | Status: DC | PRN
  Administered 2017-08-09: 60 mg via INTRAVENOUS

## 2017-08-09 MED ORDER — EPHEDRINE SULFATE 50 MG/ML IJ SOLN
INTRAMUSCULAR | Status: DC | PRN
  Administered 2017-08-09 (×2): 5 mg via INTRAVENOUS

## 2017-08-09 MED ORDER — LIDOCAINE 2% (20 MG/ML) 5 ML SYRINGE
INTRAMUSCULAR | Status: DC | PRN
  Administered 2017-08-09: 30 mg via INTRAVENOUS

## 2017-08-09 MED ORDER — ORAL CARE MOUTH RINSE
15.0000 mL | Freq: Two times a day (BID) | OROMUCOSAL | Status: DC
  Administered 2017-08-09 – 2017-08-11 (×3): 15 mL via OROMUCOSAL

## 2017-08-09 NOTE — Interval H&P Note (Signed)
History and Physical Interval Note:  08/09/2017 12:44 PM  Rebecca Norman  has presented today for surgery, with the diagnosis of A-FIB  The various methods of treatment have been discussed with the patient and family. After consideration of risks, benefits and other options for treatment, the patient has consented to  Procedure(s): CARDIOVERSION (N/A) as a surgical intervention .  The patient's history has been reviewed, patient examined, no change in status, stable for surgery.  I have reviewed the patient's chart and labs.  Questions were answered to the patient's satisfaction.     Riggin Cuttino [employer]

## 2017-08-09 NOTE — Progress Notes (Signed)
Progress Note  Patient Name: Rebecca Norman Date of Encounter: 08/09/2017  Primary Cardiologist: Dr Irish Lack  Subjective   Dyspnea improved; no chest pain  Inpatient Medications    Scheduled Meds: . apixaban  5 mg Oral BID  . furosemide  20 mg Intravenous BID  . levothyroxine  50 mcg Oral QAC breakfast  . metoprolol tartrate  50 mg Oral BID  . sodium chloride flush  3 mL Intravenous Q12H  . sodium chloride flush  3 mL Intravenous Q12H   Continuous Infusions: . sodium chloride Stopped (08/06/17 2335)  . sodium chloride    . diltiazem (CARDIZEM) infusion Stopped (08/07/17 2332)   PRN Meds: acetaminophen, polyvinyl alcohol, sodium chloride, sodium chloride flush, sodium chloride flush   Vital Signs    Vitals:   08/09/17 0000 08/09/17 0100 08/09/17 0323 08/09/17 0723  BP: 122/71 127/81 113/64 130/84  Pulse: (!) 39 74 85 91  Resp: (!) 25 (!) 23 19 18   Temp:   (!) 97.4 F (36.3 C) 97.7 F (36.5 C)  TempSrc:   Oral Oral  SpO2: 98% 96% 95% 91%  Weight:      Height:        Intake/Output Summary (Last 24 hours) at 08/09/2017 0751 Last data filed at 08/09/2017 0400 Gross per 24 hour  Intake 243 ml  Output 400 ml  Net -157 ml   Filed Weights   08/06/17 1109 08/07/17 1301  Weight: 177 lb (80.3 kg) 178 lb 2.1 oz (80.8 kg)    Telemetry    Afib rate controlled- Personally Reviewed   Physical Exam   GEN: WD/WN, NAD Neck: no JVD Cardiac: irregular Respiratory: CTA GI: Soft, NT/ND MS: No edema Neuro: No focal findings   Labs    Chemistry Recent Labs  Lab 08/07/17 0339 08/08/17 0204 08/09/17 0230  NA 138 136 137  K 4.1 3.5 4.0  CL 107 97* 97*  CO2 26 29 30   GLUCOSE 95 117* 105*  BUN 17 23* 23*  CREATININE 0.87 0.98 0.88  CALCIUM 8.5* 8.5* 8.7*  GFRNONAA 57* 49* 56*  GFRAA >60 57* >60  ANIONGAP 5 10 10      Hematology Recent Labs  Lab 08/07/17 0339 08/08/17 0204 08/09/17 0230  WBC 6.7 9.3 10.3  RBC 3.89 4.16 4.33  HGB 12.3 12.9 13.5    HCT 38.0 40.4 42.0  MCV 97.7 97.1 97.0  MCH 31.6 31.0 31.2  MCHC 32.4 31.9 32.1  RDW 14.0 13.8 13.8  PLT 175 210 205     Recent Labs  Lab 08/06/17 1127  TROPIPOC 0.00     BNP Recent Labs  Lab 08/06/17 1500  BNP 1,080.8*      Patient Profile     83 y.o. female admitted with recent diagnosis of atrial fibrillation, acute diastolic congestive heart failure and fatigue.  Assessment & Plan    1 persistent atrial fibrillation-pt remains in atrial fibrillation; continue present meds for rate control; continue apixaban. For DCCV today. If she does not hold sinus, will add amiodarone. Atrial fibrillation likely cause of CHF. LV function is normal.  TSH normal.  Note she has been compliant with apixaban for greater than 21 days.    2 acute diastolic congestive heart failure-likely related to atrial fibrillation.  I/O - 1950 since admission. Continue lasix at present dose and follow renal function.   3 hypertension-BP controlled; continue present meds and follow.  For questions or updates, please contact [place] Please consult [email or web address] for contact info under  Cardiology/STEMI.      Signed, Kirk Ruths, MD  08/09/2017, 7:51 AM

## 2017-08-09 NOTE — Anesthesia Postprocedure Evaluation (Signed)
Anesthesia Post Note  Patient: Rebecca Norman  Procedure(s) Performed: CARDIOVERSION (N/A )     Patient location during evaluation: PACU Anesthesia Type: General Level of consciousness: awake and alert Pain management: pain level controlled Vital Signs Assessment: post-procedure vital signs reviewed and stable Respiratory status: spontaneous breathing, nonlabored ventilation, respiratory function stable and patient connected to nasal cannula oxygen Cardiovascular status: blood pressure returned to baseline and stable Postop Assessment: no apparent nausea or vomiting Anesthetic complications: no    Last Vitals:  Vitals:   08/09/17 1320 08/09/17 1330  BP: (!) 99/47 (!) 116/51  Pulse: (!) 55 (!) 58  Resp: (!) 21 20  Temp:    SpO2: 97% 95%    Last Pain:  Vitals:   08/09/17 1301  TempSrc: Oral  PainSc:                  Nadalyn Deringer,JAMES TERRILL

## 2017-08-09 NOTE — H&P (View-Only) (Signed)
Progress Note  Patient Name: Rebecca Norman Date of Encounter: 08/09/2017  Primary Cardiologist: Dr Irish Lack  Subjective   Dyspnea improved; no chest pain  Inpatient Medications    Scheduled Meds: . apixaban  5 mg Oral BID  . furosemide  20 mg Intravenous BID  . levothyroxine  50 mcg Oral QAC breakfast  . metoprolol tartrate  50 mg Oral BID  . sodium chloride flush  3 mL Intravenous Q12H  . sodium chloride flush  3 mL Intravenous Q12H   Continuous Infusions: . sodium chloride Stopped (08/06/17 2335)  . sodium chloride    . diltiazem (CARDIZEM) infusion Stopped (08/07/17 2332)   PRN Meds: acetaminophen, polyvinyl alcohol, sodium chloride, sodium chloride flush, sodium chloride flush   Vital Signs    Vitals:   08/09/17 0000 08/09/17 0100 08/09/17 0323 08/09/17 0723  BP: 122/71 127/81 113/64 130/84  Pulse: (!) 39 74 85 91  Resp: (!) 25 (!) 23 19 18   Temp:   (!) 97.4 F (36.3 C) 97.7 F (36.5 C)  TempSrc:   Oral Oral  SpO2: 98% 96% 95% 91%  Weight:      Height:        Intake/Output Summary (Last 24 hours) at 08/09/2017 0751 Last data filed at 08/09/2017 0400 Gross per 24 hour  Intake 243 ml  Output 400 ml  Net -157 ml   Filed Weights   08/06/17 1109 08/07/17 1301  Weight: 177 lb (80.3 kg) 178 lb 2.1 oz (80.8 kg)    Telemetry    Afib rate controlled- Personally Reviewed   Physical Exam   GEN: WD/WN, NAD Neck: no JVD Cardiac: irregular Respiratory: CTA GI: Soft, NT/ND MS: No edema Neuro: No focal findings   Labs    Chemistry Recent Labs  Lab 08/07/17 0339 08/08/17 0204 08/09/17 0230  NA 138 136 137  K 4.1 3.5 4.0  CL 107 97* 97*  CO2 26 29 30   GLUCOSE 95 117* 105*  BUN 17 23* 23*  CREATININE 0.87 0.98 0.88  CALCIUM 8.5* 8.5* 8.7*  GFRNONAA 57* 49* 56*  GFRAA >60 57* >60  ANIONGAP 5 10 10      Hematology Recent Labs  Lab 08/07/17 0339 08/08/17 0204 08/09/17 0230  WBC 6.7 9.3 10.3  RBC 3.89 4.16 4.33  HGB 12.3 12.9 13.5    HCT 38.0 40.4 42.0  MCV 97.7 97.1 97.0  MCH 31.6 31.0 31.2  MCHC 32.4 31.9 32.1  RDW 14.0 13.8 13.8  PLT 175 210 205     Recent Labs  Lab 08/06/17 1127  TROPIPOC 0.00     BNP Recent Labs  Lab 08/06/17 1500  BNP 1,080.8*      Patient Profile     83 y.o. female admitted with recent diagnosis of atrial fibrillation, acute diastolic congestive heart failure and fatigue.  Assessment & Plan    1 persistent atrial fibrillation-pt remains in atrial fibrillation; continue present meds for rate control; continue apixaban. For DCCV today. If she does not hold sinus, will add amiodarone. Atrial fibrillation likely cause of CHF. LV function is normal.  TSH normal.  Note she has been compliant with apixaban for greater than 21 days.    2 acute diastolic congestive heart failure-likely related to atrial fibrillation.  I/O - 1950 since admission. Continue lasix at present dose and follow renal function.   3 hypertension-BP controlled; continue present meds and follow.  For questions or updates, please contact [place] Please consult [email or web address] for contact info under  Cardiology/STEMI.      Signed, Kirk Ruths, MD  08/09/2017, 7:51 AM

## 2017-08-09 NOTE — Procedures (Signed)
Electrical Cardioversion Procedure Note Rebecca Norman [id number] 1924/12/13  Procedure: Electrical Cardioversion Indications:  Atrial Fibrillation  Procedure Details Consent: Risks of procedure as well as the alternatives and risks of each were explained to the (patient/caregiver).  Consent for procedure obtained. Time Out: Verified patient identification, verified procedure, site/side was marked, verified correct patient position, special equipment/implants available, medications/allergies/relevent history reviewed, required imaging and test results available.  Performed  Patient placed on cardiac monitor, pulse oximetry, supplemental oxygen as necessary.  Sedation given: Propofol per anesthesiology Pacer pads placed anterior and posterior chest.  Cardioverted 1 time(s).  Cardioverted at [place].  Evaluation Findings: Post procedure EKG shows: NSR Complications: None Patient did tolerate procedure well.   Loralie Champagne 08/09/2017, 12:49 PM

## 2017-08-09 NOTE — Transfer of Care (Signed)
Immediate Anesthesia Transfer of Care Note  Patient: Rebecca Norman  Procedure(s) Performed: CARDIOVERSION (N/A )  Patient Location: Endoscopy Unit  Anesthesia Type:General  Level of Consciousness: awake, patient cooperative and responds to stimulation  Airway & Oxygen Therapy: Patient Spontanous Breathing and Patient connected to nasal cannula oxygen  Post-op Assessment: Report given to RN, Post -op Vital signs reviewed and stable and Patient moving all extremities X 4  Post vital signs: Reviewed and stable  Last Vitals:  Vitals:   08/09/17 1253 08/09/17 1254  BP: (!) 90/41   Pulse: (!) 55 (!) 53  Resp: (!) 23 (!) 23  Temp:    SpO2: 98% 97%    Last Pain:  Vitals:   08/09/17 1107  TempSrc: Oral  PainSc:          Complications: No apparent anesthesia complications

## 2017-08-09 NOTE — Anesthesia Preprocedure Evaluation (Addendum)
Anesthesia Evaluation  Patient identified by MRN, date of birth, ID band Patient awake    Reviewed: Allergy & Precautions, NPO status   History of Anesthesia Complications Negative for: history of anesthetic complications  Airway Mallampati: II   Neck ROM: Full    Dental no notable dental hx. (+) Edentulous Upper, Edentulous Lower   Pulmonary former smoker,    breath sounds clear to auscultation       Cardiovascular hypertension, + dysrhythmias + Valvular Problems/Murmurs  Rhythm:Regular Rate:Normal     Neuro/Psych negative neurological ROS     GI/Hepatic   Endo/Other  Hypothyroidism   Renal/GU      Musculoskeletal   Abdominal   Peds  Hematology negative hematology ROS (+)   Anesthesia Other Findings   Reproductive/Obstetrics                            Anesthesia Physical Anesthesia Plan  ASA: III  Anesthesia Plan: General   Post-op Pain Management:    Induction: Intravenous  PONV Risk Score and Plan: 3 and Treatment may vary due to age or medical condition, Dexamethasone and Ondansetron  Airway Management Planned: Mask  Additional Equipment:   Intra-op Plan:   Post-operative Plan:   Informed Consent: I have reviewed the patients History and Physical, chart, labs and discussed the procedure including the risks, benefits and alternatives for the proposed anesthesia with the patient or authorized representative who has indicated his/her understanding and acceptance.     Plan Discussed with: CRNA  Anesthesia Plan Comments:         Anesthesia Quick Evaluation

## 2017-08-10 ENCOUNTER — Ambulatory Visit: Payer: Medicare Other | Admitting: Interventional Cardiology

## 2017-08-10 LAB — BASIC METABOLIC PANEL
ANION GAP: 10 (ref 5–15)
BUN: 25 mg/dL — ABNORMAL HIGH (ref 6–20)
CHLORIDE: 95 mmol/L — AB (ref 101–111)
CO2: 31 mmol/L (ref 22–32)
Calcium: 8.7 mg/dL — ABNORMAL LOW (ref 8.9–10.3)
Creatinine, Ser: 0.79 mg/dL (ref 0.44–1.00)
GFR calc non Af Amer: >60 mL/min (ref >60)
Glucose, Bld: 101 mg/dL — ABNORMAL HIGH (ref 65–99)
POTASSIUM: 3.7 mmol/L (ref 3.5–5.1)
Sodium: 136 mmol/L (ref 135–145)

## 2017-08-10 LAB — CBC
HCT: 40.9 % (ref 36.0–46.0)
HEMOGLOBIN: 13.4 g/dL (ref 12.0–15.0)
MCH: 31.8 pg (ref 26.0–34.0)
MCHC: 32.8 g/dL (ref 30.0–36.0)
MCV: 96.9 fL (ref 78.0–100.0)
Platelets: 186 10*3/uL (ref 150–400)
RBC: 4.22 MIL/uL (ref 3.87–5.11)
RDW: 13.8 % (ref 11.5–15.5)
WBC: 7.2 10*3/uL (ref 4.0–10.5)

## 2017-08-10 MED ORDER — METOPROLOL TARTRATE 25 MG PO TABS
25.0000 mg | ORAL_TABLET | Freq: Two times a day (BID) | ORAL | Status: DC
  Administered 2017-08-10 – 2017-08-11 (×3): 25 mg via ORAL
  Filled 2017-08-10 (×3): qty 1

## 2017-08-10 MED ORDER — FUROSEMIDE 20 MG PO TABS
20.0000 mg | ORAL_TABLET | Freq: Every day | ORAL | Status: DC
  Administered 2017-08-10 – 2017-08-11 (×2): 20 mg via ORAL
  Filled 2017-08-10 (×2): qty 1

## 2017-08-10 MED ORDER — AMIODARONE HCL 200 MG PO TABS
200.0000 mg | ORAL_TABLET | Freq: Two times a day (BID) | ORAL | Status: DC
  Administered 2017-08-10 – 2017-08-11 (×3): 200 mg via ORAL
  Filled 2017-08-10 (×3): qty 1

## 2017-08-10 NOTE — Progress Notes (Signed)
Progress Note  Patient Name: Rebecca Norman Date of Encounter: 08/10/2017  Primary Cardiologist: Dr Irish Lack  Subjective   Denies CP or dyspnea  Inpatient Medications    Scheduled Meds: . apixaban  5 mg Oral BID  . furosemide  20 mg Intravenous BID  . levothyroxine  50 mcg Oral QAC breakfast  . mouth rinse  15 mL Mouth Rinse BID  . metoprolol tartrate  50 mg Oral BID  . sodium chloride flush  3 mL Intravenous Q12H  . sodium chloride flush  3 mL Intravenous Q12H   Continuous Infusions: . sodium chloride Stopped (08/09/17 1332)  . sodium chloride    . diltiazem (CARDIZEM) infusion Stopped (08/07/17 2332)   PRN Meds: acetaminophen, polyvinyl alcohol, sodium chloride, sodium chloride flush, sodium chloride flush   Vital Signs    Vitals:   08/09/17 1952 08/09/17 2345 08/10/17 0433 08/10/17 0816  BP: (!) 101/59 (!) 109/53 (!) 103/52 111/73  Pulse: 65 (!) 57 62 (!) 56  Resp: 17 20 19 15   Temp: 97.7 F (36.5 C) 97.7 F (36.5 C) 98.1 F (36.7 C) 97.6 F (36.4 C)  TempSrc: Axillary Oral Oral Oral  SpO2: 96% 98% 96% 97%  Weight:      Height:        Intake/Output Summary (Last 24 hours) at 08/10/2017 0834 Last data filed at 08/10/2017 0867 Gross per 24 hour  Intake 590 ml  Output 700 ml  Net -110 ml   Filed Weights   08/06/17 1109 08/07/17 1301 08/09/17 1107  Weight: 177 lb (80.3 kg) 178 lb 2.1 oz (80.8 kg) 178 lb (80.7 kg)    Telemetry    Sinus with runs of PAT- Personally Reviewed   Physical Exam   GEN: WD, NAD Neck: supple Cardiac: RRR Respiratory: CTA  GI: Soft, NT/ND, no masses MS: No edema Neuro: Grossly intact   Labs    Chemistry Recent Labs  Lab 08/08/17 0204 08/09/17 0230 08/10/17 0336  NA 136 137 136  K 3.5 4.0 3.7  CL 97* 97* 95*  CO2 29 30 31   GLUCOSE 117* 105* 101*  BUN 23* 23* 25*  CREATININE 0.98 0.88 0.79  CALCIUM 8.5* 8.7* 8.7*  GFRNONAA 49* 56* >60  GFRAA 57* >60 >60  ANIONGAP 10 10 10      Hematology Recent Labs   Lab 08/08/17 0204 08/09/17 0230 08/10/17 0336  WBC 9.3 10.3 7.2  RBC 4.16 4.33 4.22  HGB 12.9 13.5 13.4  HCT 40.4 42.0 40.9  MCV 97.1 97.0 96.9  MCH 31.0 31.2 31.8  MCHC 31.9 32.1 32.8  RDW 13.8 13.8 13.8  PLT 210 205 186     Recent Labs  Lab 08/06/17 1127  TROPIPOC 0.00     BNP Recent Labs  Lab 08/06/17 1500  BNP 1,080.8*      Patient Profile     83 y.o. female admitted with recent diagnosis of atrial fibrillation, acute diastolic congestive heart failure and fatigue.  Assessment & Plan    1 persistent atrial fibrillation-Pt now in sinus s/p DCCV. Short runs of PAT on telemetry. Will add amiodarone to help maintain sinus; continue apixaban. Atrial fibrillation was likely cause of CHF. LV function is normal.  TSH normal.   2 acute diastolic congestive heart failure-improved; change lasix to 20 mg daily and follow renal function.   3 hypertension-BP controlled; will decrease metoprolol to 25 mg BID given addition and amiodarone and mild bradycardia on telemetry.  Ambulate today; possible DC in AM  if stable  For questions or updates, please contact [place] Please consult [email or web address] for contact info under Cardiology/STEMI.      Signed, Kirk Ruths, MD  08/10/2017, 8:34 AM

## 2017-08-10 NOTE — Care Management Note (Signed)
Case Management Note  Patient Details  Name: Rebecca Norman MRN: [id number] Date of Birth: 18-Sep-1924  Subjective/Objective:  Pt admitted with SOB                   Action/Plan:  PTA independent from home.  Pt is now s/p cardioversion and remains in intermittent A fib.  Pt has PCP and has prescription coverage.  CM will continue to follow for discharge needs   Expected Discharge Date:                  Expected Discharge Plan:  Home/Self Care  In-House Referral:     Discharge planning Services  CM Consult  Post Acute Care Choice:    Choice offered to:     DME Arranged:    DME Agency:     HH Arranged:    HH Agency:     Status of Service:     If discussed at [employer] of Stay Meetings, dates discussed:    Additional Comments:  Maryclare Labrador, RN 08/10/2017, 3:10 PM

## 2017-08-11 DIAGNOSIS — Z7901 Long term (current) use of anticoagulants: Secondary | ICD-10-CM

## 2017-08-11 LAB — BASIC METABOLIC PANEL
ANION GAP: 7 (ref 5–15)
BUN: 25 mg/dL — ABNORMAL HIGH (ref 6–20)
CALCIUM: 8.7 mg/dL — AB (ref 8.9–10.3)
CO2: 31 mmol/L (ref 22–32)
Chloride: 99 mmol/L — ABNORMAL LOW (ref 101–111)
Creatinine, Ser: 0.86 mg/dL (ref 0.44–1.00)
GFR, EST NON AFRICAN AMERICAN: 58 mL/min — AB (ref >60)
Glucose, Bld: 97 mg/dL (ref 65–99)
Potassium: 4 mmol/L (ref 3.5–5.1)
Sodium: 137 mmol/L (ref 135–145)

## 2017-08-11 MED ORDER — FUROSEMIDE 20 MG PO TABS: 20.0000 mg | ORAL_TABLET | Freq: Every day | ORAL | 3 refills | Status: DC

## 2017-08-11 MED ORDER — AMIODARONE HCL 200 MG PO TABS: 200.0000 mg | ORAL_TABLET | Freq: Two times a day (BID) | ORAL | 3 refills | Status: DC

## 2017-08-11 MED ORDER — METOPROLOL TARTRATE 25 MG PO TABS: 25.0000 mg | ORAL_TABLET | Freq: Two times a day (BID) | ORAL | 3 refills | Status: DC

## 2017-08-11 MED ORDER — SALINE SPRAY 0.65 % NA SOLN
1.0000 | NASAL | Status: DC | PRN
  Filled 2017-08-11: qty 44

## 2017-08-11 NOTE — Progress Notes (Signed)
Progress Note  Patient Name: Rebecca Norman Date of Encounter: 08/11/2017  Primary Cardiologist: Dr Irish Lack  Subjective   No chest pain or dyspnea  Inpatient Medications    Scheduled Meds: . amiodarone  200 mg Oral BID  . apixaban  5 mg Oral BID  . furosemide  20 mg Oral Daily  . levothyroxine  50 mcg Oral QAC breakfast  . mouth rinse  15 mL Mouth Rinse BID  . metoprolol tartrate  25 mg Oral BID  . sodium chloride flush  3 mL Intravenous Q12H  . sodium chloride flush  3 mL Intravenous Q12H   Continuous Infusions: . sodium chloride Stopped (08/09/17 1332)  . sodium chloride     PRN Meds: acetaminophen, polyvinyl alcohol, sodium chloride, sodium chloride flush, sodium chloride flush   Vital Signs    Vitals:   08/10/17 2027 08/10/17 2148 08/11/17 0046 08/11/17 0534  BP: (!) 110/44 (!) 119/50 (!) 117/50 (!) 142/68  Pulse: 71 64 63 64  Resp:    18  Temp: 97.6 F (36.4 C)  98.1 F (36.7 C) 98.2 F (36.8 C)  TempSrc: Oral  Oral Oral  SpO2: 90%  91% 94%  Weight:    164 lb 12.8 oz (74.8 kg)  Height:        Intake/Output Summary (Last 24 hours) at 08/11/2017 1026 Last data filed at 08/11/2017 0700 Gross per 24 hour  Intake 843 ml  Output 1000 ml  Net -157 ml   Filed Weights   08/09/17 1107 08/10/17 1853 08/11/17 0534  Weight: 178 lb (80.7 kg) 169 lb 9.6 oz (76.9 kg) 164 lb 12.8 oz (74.8 kg)    Telemetry    Sinus- Personally Reviewed   Physical Exam   GEN: WD, obese, NAD Neck: no JVD Cardiac: RRR Respiratory: CTA, no wheeze  GI: NT/ND, no masses MS: No edema Neuro: No focal findings   Labs    Chemistry Recent Labs  Lab 08/09/17 0230 08/10/17 0336 08/11/17 0358  NA 137 136 137  K 4.0 3.7 4.0  CL 97* 95* 99*  CO2 30 31 31   GLUCOSE 105* 101* 97  BUN 23* 25* 25*  CREATININE 0.88 0.79 0.86  CALCIUM 8.7* 8.7* 8.7*  GFRNONAA 56* >60 58*  GFRAA >60 >60 >60  ANIONGAP 10 10 7      Hematology Recent Labs  Lab 08/08/17 0204 08/09/17 0230  08/10/17 0336  WBC 9.3 10.3 7.2  RBC 4.16 4.33 4.22  HGB 12.9 13.5 13.4  HCT 40.4 42.0 40.9  MCV 97.1 97.0 96.9  MCH 31.0 31.2 31.8  MCHC 31.9 32.1 32.8  RDW 13.8 13.8 13.8  PLT 210 205 186     Recent Labs  Lab 08/06/17 1127  TROPIPOC 0.00     BNP Recent Labs  Lab 08/06/17 1500  BNP 1,080.8*      Patient Profile     83 y.o. female admitted with recent diagnosis of atrial fibrillation, acute diastolic congestive heart failure and fatigue.  Assessment & Plan    1 persistent atrial fibrillation-Pt remains in sinus s/p DCCV. Will continue amiodarone to help maintain sinus (200 mg BID for 2 weeks and then 200 mg daily thereafter); continue apixaban. Atrial fibrillation was likely cause of CHF. LV function is normal.  TSH normal.   2 acute diastolic congestive heart failure-resolved; continue lasix 20 mg daily at DC; check bmet one week.  3 hypertension-BP controlled; continue metoprolol at present dose.  DC today with TOC appt with APP  one week; fu with me 3 months. > 30 min PA and physician time D2  For questions or updates, please contact [place] Please consult [email or web address] for contact info under Cardiology/STEMI.      Signed, Kirk Ruths, MD  08/11/2017, 10:26 AM

## 2017-08-11 NOTE — Plan of Care (Signed)
  Education: Knowledge of General Education information will improve 08/11/2017 0054 - Adequate for Discharge by Tristan Schroeder, RN

## 2017-08-11 NOTE — Discharge Summary (Signed)
Discharge Summary    Patient ID: Rebecca Norman,  MRN: [id number], DOB/AGE: 05/27/1925 83 y.o.  Admit date: 08/06/2017 Discharge date: 08/11/2017  Primary Care Provider: [name] Primary Cardiologist: Dr. [name]  Discharge Diagnoses    Principal Problem:   Atrial fibrillation with RVR ([place]) Active Problems:   HTN (hypertension)   Palpitations   Current use of long term anticoagulation   History of Present Illness     Rebecca Norman is a 83 y.o. female with past medical history of HTN and palpitations who was recently evaluated by Cardiology for worsening palpitations. An event monitor was placed and showed atrial fibrillation with RVR, therefore she was started on Lopressor for rate-control and Eliquis for anticoagulation. She has been followed in the [facility] since with titration of her AV nodal blocking agents but continued to have fatigue.   She presented to [facility] ED on 08/06/2017 for worsening weakness and fatigue, found to still be in atrial fibrillation with RVR. Therefore, she was started on IV Cardizem with anticipation of a DCCV. BNP was elevated to 1080 and CXR showed pulmonary interstitial edema, therefore she was also started on IV Lasix for diuresis and admitted for further treatment.   Hospital Course     Consultants: None  She responded well to IV Lasix and experienced improvement in her respiratory status. Due to schedule availability, her DCCV was scheduled for 08/09/2017. She underwent a successful cardioversion at 200J with return to NSR.   On 08/10/2017, she was maintaining NSR but noted to have brief episodes of PAT on telemetry. Therefore, Amiodarone 200mg  BID was added to her medication regimen to help maintain NSR. She was noted to have episodes of hypotension during admission, therefore Cardizem was discontinued and Lopressor was reduced to 25mg  BID. She was transitioned from IV to PO Lasix and diuresed over -2.2L.  Weight  was down to 164 lbs at the time of discharge and she was switched to PO Lasix 20mg  daily.   She was last examined by Dr. [name] on 08/11/2017 and deemed stable for discharge. Was still maintaining NSR. She will continue on Amiodarone 200mg  BID for 2 weeks then decrease to 200mg  daily. A TOC appointment has been arranged and she will follow-up with Dr. [name] in 3 months. She was discharged home in good condition.   _____________  Discharge Vitals Blood pressure (!) 123/43, pulse (!) 52, temperature 98.1 F (36.7 C), temperature source Oral, resp. rate 18, height 5\' 4"  (1.626 m), weight 164 lb 12.8 oz (74.8 kg), SpO2 95 %.  Filed Weights   08/09/17 1107 08/10/17 1853 08/11/17 0534  Weight: 178 lb (80.7 kg) 169 lb 9.6 oz (76.9 kg) 164 lb 12.8 oz (74.8 kg)    Labs & Radiologic Studies     CBC Recent Labs    08/09/17 0230 08/10/17 0336  WBC 10.3 7.2  HGB 13.5 13.4  HCT 42.0 40.9  MCV 97.0 96.9  PLT 205 381   Basic Metabolic Panel Recent Labs    08/10/17 0336 08/11/17 0358  NA 136 137  K 3.7 4.0  CL 95* 99*  CO2 31 31  GLUCOSE 101* 97  BUN 25* 25*  CREATININE 0.79 0.86  CALCIUM 8.7* 8.7*   Liver Function Tests No results for input(s): AST, ALT, ALKPHOS, BILITOT, PROT, ALBUMIN in the last 72 hours. No results for input(s): LIPASE, AMYLASE in the last 72 hours. Cardiac Enzymes No results for input(s): CKTOTAL, CKMB, CKMBINDEX, TROPONINI in the  last 72 hours. BNP Invalid input(s): POCBNP D-Dimer No results for input(s): DDIMER in the last 72 hours. Hemoglobin A1C No results for input(s): HGBA1C in the last 72 hours. Fasting Lipid Panel No results for input(s): CHOL, HDL, LDLCALC, TRIG, CHOLHDL, LDLDIRECT in the last 72 hours. Thyroid Function Tests No results for input(s): TSH, T4TOTAL, T3FREE, THYROIDAB in the last 72 hours.  Invalid input(s): FREET3  Dg Chest 2 View  Result Date: 08/06/2017 CLINICAL DATA:  Shortness breath and cough. EXAM: CHEST  2 VIEW  COMPARISON:  Chest x-ray dated June 29, 2010. FINDINGS: Moderate cardiomegaly, similar to prior study. Diffusely increased interstitial markings. Atherosclerotic calcification of the aortic arch. Small right pleural effusion. Bibasilar atelectasis. No pneumothorax. No acute osseous abnormality. IMPRESSION: Moderate cardiomegaly with pulmonary interstitial edema and small right pleural effusion. Electronically Signed   By: Titus Dubin M.D.   On: 08/06/2017 12:11     Diagnostic Studies/Procedures    DCCV: 08/09/2017 Procedure Details Consent: Risks of procedure as well as the alternatives and risks of each were explained to the (patient/caregiver).  Consent for procedure obtained. Time Out: Verified patient identification, verified procedure, site/side was marked, verified correct patient position, special equipment/implants available, medications/allergies/relevent history reviewed, required imaging and test results available.  Performed  Patient placed on cardiac monitor, pulse oximetry, supplemental oxygen as necessary.  Sedation given: Propofol per anesthesiology Pacer pads placed anterior and posterior chest.  Cardioverted 1 time(s).  Cardioverted at [place].  Evaluation Findings: Post procedure EKG shows: NSR Complications: None Patient did tolerate procedure well.   Disposition   Pt is being discharged home today in good condition.  Follow-up Plans & Appointments    Follow-up Information    Almyra Deforest, [place] Follow up on 08/23/2017.   Specialties:  Cardiology, Radiology Why:  Cardiology Follow-Up on 08/23/2017 at 10:00AM with Almyra Deforest, PA-C (works with Dr. [name]) Contact information: [address] [place] [address] [phone]        [name], MD.   Specialty:  Family Medicine Why:  Office will call Contact information: [place] [employer] [place] [address] [phone]          Discharge Instructions    Diet -  low sodium heart healthy   Complete by:  As directed       Discharge Medications     Medication List    STOP taking these medications   diltiazem 120 MG 24 hr capsule Commonly known as:  CARDIZEM CD     TAKE these medications   acetaminophen 500 MG tablet Commonly known as:  TYLENOL Take 500 mg by mouth every 6 (six) hours as needed for headache (pain).   amiodarone 200 MG tablet Commonly known as:  PACERONE Take 1 tablet (200 mg total) by mouth 2 (two) times daily. After two weeks, reduce to 1 tablet (200mg ) once daily. Notes to patient:  REDUCE TO ONE TABLET DAILY ON 08/25/2017.   apixaban 5 MG Tabs tablet Commonly known as:  ELIQUIS Take 1 tablet (5 mg total) by mouth 2 (two) times daily.   ARTIFICIAL TEARS 1.4 % ophthalmic solution Generic drug:  polyvinyl alcohol Place 1 drop into both eyes daily as needed for dry eyes (itching).   furosemide 20 MG tablet Commonly known as:  LASIX Take 1 tablet (20 mg total) by mouth daily. Start taking on:  08/12/2017   levothyroxine 50 MCG tablet Commonly known as:  SYNTHROID, LEVOTHROID Take 50 mcg by mouth daily before breakfast.  metoprolol tartrate 25 MG tablet Commonly known as:  LOPRESSOR Take 1 tablet (25 mg total) by mouth 2 (two) times daily. What changed:    medication strength  how much to take   sodium chloride 0.65 % Soln nasal spray Commonly known as:  OCEAN Place 1 spray into both nostrils daily as needed for congestion.         Allergies Allergies  Allergen Reactions  . Flagyl [Metronidazole] Hives  . Lisinopril Cough     Outstanding Labs/Studies   LFT's and TSH as part of routine Amiodarone labs.   Duration of Discharge Encounter   Greater than 30 minutes including physician time.  Signed, Erma Heritage, PA-C 08/11/2017, 1:26 PM

## 2017-08-11 NOTE — Progress Notes (Signed)
Patient discharged home. Family coming to pick her up. Discharge papers given to patient. She denies any needs for anything else at this time.

## 2017-08-11 NOTE — Care Management Important Message (Signed)
Important Message  Patient Details  Name: Rebecca Norman MRN: [id number] Date of Birth: December 27, 1924   Medicare Important Message Given:  Yes    Orbie Pyo 08/11/2017, 12:39 PM

## 2017-08-23 ENCOUNTER — Ambulatory Visit (INDEPENDENT_AMBULATORY_CARE_PROVIDER_SITE_OTHER): Payer: Medicare Other | Admitting: Physician Assistant

## 2017-08-23 ENCOUNTER — Other Ambulatory Visit: Payer: Self-pay | Admitting: *Deleted

## 2017-08-23 ENCOUNTER — Encounter: Payer: Self-pay | Admitting: Physician Assistant

## 2017-08-23 VITALS — BP 144/70 | HR 52 | Ht 64.0 in | Wt 170.2 lb

## 2017-08-23 DIAGNOSIS — R531 Weakness: Secondary | ICD-10-CM | POA: Diagnosis not present

## 2017-08-23 DIAGNOSIS — Z79899 Other long term (current) drug therapy: Secondary | ICD-10-CM | POA: Diagnosis not present

## 2017-08-23 DIAGNOSIS — I480 Paroxysmal atrial fibrillation: Secondary | ICD-10-CM

## 2017-08-23 DIAGNOSIS — I10 Essential (primary) hypertension: Secondary | ICD-10-CM | POA: Diagnosis not present

## 2017-08-23 DIAGNOSIS — I5032 Chronic diastolic (congestive) heart failure: Secondary | ICD-10-CM

## 2017-08-23 LAB — BASIC METABOLIC PANEL
BUN / CREAT RATIO: 20 (ref 12–28)
BUN: 17 mg/dL (ref 10–36)
CHLORIDE: 99 mmol/L (ref 96–106)
CO2: 24 mmol/L (ref 20–29)
CREATININE: 0.87 mg/dL (ref 0.57–1.00)
Calcium: 9.4 mg/dL (ref 8.7–10.3)
GFR calc non Af Amer: 59 mL/min/{1.73_m2} — ABNORMAL LOW (ref >59)
GFR, EST AFRICAN AMERICAN: 68 mL/min/{1.73_m2} (ref >59)
GLUCOSE: 94 mg/dL (ref 65–99)
Potassium: 4.7 mmol/L (ref 3.5–5.2)
Sodium: 138 mmol/L (ref 134–144)

## 2017-08-23 MED ORDER — AMLODIPINE BESYLATE 2.5 MG PO TABS: 2.5000 mg | ORAL_TABLET | Freq: Every day | ORAL | 3 refills | Status: DC

## 2017-08-23 MED ORDER — AMIODARONE HCL 200 MG PO TABS: 200.0000 mg | ORAL_TABLET | Freq: Every day | ORAL | 3 refills | Status: DC

## 2017-08-23 NOTE — Patient Instructions (Signed)
Medication Instructions:   START Amlodipine 2.5mg  DAILY  REDUCE Amiodarone from 200mg  twice a day to 200mg  ONCE DAILY   Labwork:   BMET today in office   Testing/Procedures:  none  Follow-Up:  As scheduled with Dr. [name].  Any Other Special Instructions Will Be Listed Below (If Applicable).     If you need a refill on your cardiac medications before your next appointment, please call your pharmacy.

## 2017-08-23 NOTE — Progress Notes (Signed)
Cardiology Office Note    Date:  08/23/2017   ID:  Rebecca Norman, DOB April 26, 1925, MRN [id number]  PCP:  [name], MD  Cardiologist:  Dr. [name]  Chief Complaint  Patient presents with  . Hospitalization Follow-up    pt reports leg weakness    History of Present Illness:  Rebecca Norman is a 83 y.o. female with PMH of HTN and atrial fibrillation.  Patient was diagnosed with atrial fibrillation with RVR on event monitor in October 2018.  Norvasc was stopped and she was started on Toprol and eliquis.  She was subsequently sent to atrial fibrillation clinic for further management.  She presented to [facility] on 08/06/2017 for worsening fatigue and weakness and found to be in atrial fibrillation with RVR.  She was started on IV Cardizem with anticipation of DC cardioversion.  BNP was elevated at 1080, chest x-ray showed pulmonary interstitial edema.  She was also treated with IV Lasix for diuresis.  Her breathing improved with IV Lasix.  She underwent DC cardioversion on 08/09/2017 with successful return to sinus rhythm after 200 J.  After the cardioversion, she was maintaining sinus rhythm but noted to have brief episodes of PAT on telemetry.  Therefore amiodarone 200 mg twice daily was added to her medical regimen.  She also noted to have episodes of hypotension during the admission.  Cardizem was discontinued and the Lopressor was reduced to 25 mg twice daily.  Her discharge dry weight was 164 pounds.  She was discharged on 20 mg daily of Lasix.  Patient presents today for cardiology office visit.  She remains fairly fatigued.  She is able to cook for herself, do her laundry however she requires someone else to help her clean the house.  She does not have any lower extremity edema, orthopnea, and PND.  She will need to decrease her amiodarone to 200 mg daily on 08/25/2017.  Her current heart rate is in the low 50s, she denies any dizziness, blurred vision or feeling of passing  out.  Her blood pressure however has been elevated in the 140s-150s at home, I will add 2.5 mg daily of amlodipine for blood pressure control.  Otherwise, she still has good urinary output on the current dose of Lasix.  I will obtain a basic metabolic panel.  Otherwise she can see Dr. [name] in 3 months.   Past Medical History:  Diagnosis Date  . DDD (degenerative disc disease), cervical    severe/  auto fusion c2-c5  . Diverticulosis   . Hemorrhoids   . History of adenomatous polyp of colon    1995  adenomatous polyp/  1997 & 2003  hyperplastic polyp's  . History of bladder cancer urologist-  dr [name]   s/p  TURBT's  . History of cancer of ureter   . Hypertension   . Hypothyroid   . Nocturia   . Osteoarthritis   . Osteopenia     Past Surgical History:  Procedure Laterality Date  . BACK SURGERY    . CARDIOVERSION N/A 08/09/2017   Procedure: CARDIOVERSION;  Surgeon: Larey Dresser, MD;  Location: [facility] ENDOSCOPY;  Service: Cardiovascular;  Laterality: N/A;  . CATARACT EXTRACTION W/ INTRAOCULAR LENS  IMPLANT, BILATERAL Bilateral   . CYSTOSCOPY W/ RETROGRADES  10/03/2011   Procedure: CYSTOSCOPY WITH RETROGRADE PYELOGRAM;  Surgeon: Molli Hazard, MD;  Location: [facility];  Service: Urology;;  . Consuela Mimes W/ RETROGRADES Bilateral 06/17/2016   Procedure: CYSTOSCOPY WITH RETROGRADE PYELOGRAM;  Surgeon: Alexis Frock, MD;  Location: [facility];  Service: Urology;  Laterality: Bilateral;  . CYSTOSCOPY WITH BIOPSY N/A 06/17/2016   Procedure: CYSTOSCOPY WITH BIOPSY AND FULGERATION;  Surgeon: Alexis Frock, MD;  Location: [facility];  Service: Urology;  Laterality: N/A;  . LAMINECTOMY  2009   L3 - 4  . TRANSURETHRAL RESECTION OF BLADDER  x3 --  2009; 2010; 06-29-2010  . TRANSURETHRAL RESECTION OF BLADDER TUMOR  10/03/2011   Procedure: TRANSURETHRAL RESECTION OF BLADDER TUMOR (TURBT);  Surgeon: Molli Hazard, MD;  Location:  [facility];  Service: Urology;  Laterality: N/A;    Current Medications: Outpatient Medications Prior to Visit  Medication Sig Dispense Refill  . acetaminophen (TYLENOL) 500 MG tablet Take 500 mg by mouth every 6 (six) hours as needed for headache (pain).     Marland Kitchen apixaban (ELIQUIS) 5 MG TABS tablet Take 1 tablet (5 mg total) by mouth 2 (two) times daily. 180 tablet 1  . furosemide (LASIX) 20 MG tablet Take 1 tablet (20 mg total) by mouth daily. 90 tablet 3  . levothyroxine (SYNTHROID, LEVOTHROID) 50 MCG tablet Take 50 mcg by mouth daily before breakfast.    . metoprolol tartrate (LOPRESSOR) 25 MG tablet Take 1 tablet (25 mg total) by mouth 2 (two) times daily. 180 tablet 3  . polyvinyl alcohol (ARTIFICIAL TEARS) 1.4 % ophthalmic solution Place 1 drop into both eyes daily as needed for dry eyes (itching).    . sodium chloride (OCEAN) 0.65 % SOLN nasal spray Place 1 spray into both nostrils daily as needed for congestion.    Marland Kitchen amiodarone (PACERONE) 200 MG tablet Take 1 tablet (200 mg total) by mouth 2 (two) times daily. After two weeks, reduce to 1 tablet (200mg ) once daily. 90 tablet 3   No facility-administered medications prior to visit.      Allergies:   Flagyl [metronidazole] and Lisinopril   Social History   Socioeconomic History  . Marital status: Widowed    Spouse name: None  . Number of children: None  . Years of education: None  . Highest education level: None  Social Needs  . Financial resource strain: None  . Food insecurity - worry: None  . Food insecurity - inability: None  . Transportation needs - medical: None  . Transportation needs - non-medical: None  Occupational History  . Occupation: Retired  Tobacco Use  . Smoking status: Former Smoker    Years: 10.00    Types: Cigarettes    Last attempt to quit: 09/25/2005    Years since quitting: 11.9  . Smokeless tobacco: Never Used  Substance and Sexual Activity  . Alcohol use: Yes  . Drug use: No  .  Sexual activity: None  Other Topics Concern  . None  Social History Narrative  . None     Family History:  The patient's family history includes Breast cancer in her sister; Hernia (age of onset: 81) in her sister; Stomach cancer in her sister.   ROS:   Please see the history of present illness.    ROS All other systems reviewed and are negative.   PHYSICAL EXAM:   VS:  BP (!) 144/70   Pulse (!) 52   Ht 5\' 4"  (1.626 m)   Wt 170 lb 3.2 oz (77.2 kg)   SpO2 93%   BMI 29.21 kg/m    GEN: Well nourished, well developed, in no acute distress  HEENT: normal  Neck: no JVD, carotid  bruits, or masses Cardiac: RRR; no rubs, or gallops,no edema  1/6 murmur at RUSB Respiratory:  clear to auscultation bilaterally, normal work of breathing GI: soft, nontender, nondistended, + BS MS: no deformity or atrophy  Skin: warm and dry, no rash Neuro:  Alert and Oriented x 3, Strength and sensation are intact Psych: euthymic mood, full affect  Wt Readings from Last 3 Encounters:  08/23/17 170 lb 3.2 oz (77.2 kg)  08/11/17 164 lb 12.8 oz (74.8 kg)  08/01/17 177 lb (80.3 kg)      Studies/Labs Reviewed:   EKG:  EKG is ordered today.  The ekg ordered today demonstrates sinus bradycardia, T wave inversion in anterior leads related to repolarization.  Recent Labs: 08/06/2017: B Natriuretic Peptide 1,080.8; TSH 2.929 08/07/2017: Magnesium 1.8 08/10/2017: Hemoglobin 13.4; Platelets 186 08/11/2017: BUN 25; Creatinine, Ser 0.86; Potassium 4.0; Sodium 137   Lipid Panel No results found for: CHOL, TRIG, HDL, CHOLHDL, VLDL, LDLCALC, LDLDIRECT  Additional studies/ records that were reviewed today include:   Echo 06/29/2017 LV EF: 60% -   65%  Study Conclusions  - Left ventricle: The cavity size was normal. Wall thickness was   increased in a pattern of mild LVH. Systolic function was normal.   The estimated ejection fraction was in the range of 60% to 65%.   The study is not technically  sufficient to allow evaluation of LV   diastolic function. - Aortic valve: There was trivial regurgitation. Valve area (VTI):   2.97 cm^2. Valve area (Vmax): 2.7 cm^2. Valve area (Vmean): 3.18   cm^2. - Mitral valve: Severely calcified annulus. Valve area by pressure   half-time: 2.14 cm^2. Valve area by continuity equation (using   LVOT flow): 3.21 cm^2. - Left atrium: The atrium was moderately to severely dilated. - Atrial septum: No defect or patent foramen ovale was identified.    ASSESSMENT:    1. Paroxysmal atrial fibrillation (HCC)   2. Encounter for long-term (current) use of medications   3. Weakness   4. Essential hypertension   5. Chronic diastolic heart failure (HCC)      PLAN:  In order of problems listed above:  1. Paroxysmal atrial fibrillation: Recently underwent cardioversion, compliant with Eliquis.  No bleeding issues, recent hemoglobin is stable.  Maintaining sinus bradycardia based on today's EKG.  Plan to decrease amiodarone to 200 mg daily starting on 08/25/2017.  Otherwise will continue on metoprolol 25 mg twice daily for rate control.  2. Weakness: No sign of obvious anemia based on recent lab work.  Likely related to her deconditioning after hospital stay.  3. Chronic diastolic heart failure: Euvolemic on physical exam.  Continue on 20 mg daily of Lasix.  Obtain basic metabolic panel  4. Hypertension: Blood pressure is elevated today, based on patient report, her systolic blood pressure ranges in the 140s-150s at home, will add amlodipine 2.5 mg daily.    Medication Adjustments/Labs and Tests Ordered: Current medicines are reviewed at length with the patient today.  Concerns regarding medicines are outlined above.  Medication changes, Labs and Tests ordered today are listed in the Patient Instructions below. Patient Instructions  Medication Instructions:   START Amlodipine 2.5mg  DAILY  REDUCE Amiodarone from 200mg  twice a day to 200mg  ONCE DAILY     Labwork:   BMET today in office   Testing/Procedures:  none  Follow-Up:  As scheduled with Dr. [name].  Any Other Special Instructions Will Be Listed Below (If Applicable).     If you need  a refill on your cardiac medications before your next appointment, please call your pharmacy.      Hilbert Corrigan, [place]  08/23/2017 1:33 PM    [facility] [place], [place], [place]  [address] Phone: [phone]; Fax: [phone]

## 2017-08-25 ENCOUNTER — Encounter (HOSPITAL_COMMUNITY): Payer: Self-pay

## 2017-08-25 ENCOUNTER — Inpatient Hospital Stay (HOSPITAL_COMMUNITY): Payer: Medicare Other

## 2017-08-25 ENCOUNTER — Emergency Department (HOSPITAL_COMMUNITY): Payer: Medicare Other

## 2017-08-25 ENCOUNTER — Inpatient Hospital Stay (HOSPITAL_COMMUNITY)
Admission: EM | Admit: 2017-08-25 | Discharge: 2017-09-01 | DRG: 493 | Disposition: A | Discharge status: Skilled Nursing Facility | Payer: Medicare Other | Attending: Family Medicine | Admitting: Family Medicine

## 2017-08-25 ENCOUNTER — Other Ambulatory Visit: Payer: Self-pay

## 2017-08-25 DIAGNOSIS — S82852A Displaced trimalleolar fracture of left lower leg, initial encounter for closed fracture: Principal | ICD-10-CM | POA: Diagnosis present

## 2017-08-25 DIAGNOSIS — W010XXA Fall on same level from slipping, tripping and stumbling without subsequent striking against object, initial encounter: Secondary | ICD-10-CM | POA: Diagnosis present

## 2017-08-25 DIAGNOSIS — R402413 Glasgow coma scale score 13-15, at hospital admission: Secondary | ICD-10-CM | POA: Diagnosis not present

## 2017-08-25 DIAGNOSIS — M8580 Other specified disorders of bone density and structure, unspecified site: Secondary | ICD-10-CM | POA: Diagnosis not present

## 2017-08-25 DIAGNOSIS — S82832A Other fracture of upper and lower end of left fibula, initial encounter for closed fracture: Secondary | ICD-10-CM

## 2017-08-25 DIAGNOSIS — Z888 Allergy status to other drugs, medicaments and biological substances status: Secondary | ICD-10-CM

## 2017-08-25 DIAGNOSIS — I5032 Chronic diastolic (congestive) heart failure: Secondary | ICD-10-CM | POA: Diagnosis present

## 2017-08-25 DIAGNOSIS — S93432A Sprain of tibiofibular ligament of left ankle, initial encounter: Secondary | ICD-10-CM | POA: Diagnosis not present

## 2017-08-25 DIAGNOSIS — R2689 Other abnormalities of gait and mobility: Secondary | ICD-10-CM | POA: Diagnosis not present

## 2017-08-25 DIAGNOSIS — S82892D Other fracture of left lower leg, subsequent encounter for closed fracture with routine healing: Secondary | ICD-10-CM | POA: Diagnosis not present

## 2017-08-25 DIAGNOSIS — M1990 Unspecified osteoarthritis, unspecified site: Secondary | ICD-10-CM | POA: Diagnosis present

## 2017-08-25 DIAGNOSIS — S99922A Unspecified injury of left foot, initial encounter: Secondary | ICD-10-CM | POA: Diagnosis not present

## 2017-08-25 DIAGNOSIS — C679 Malignant neoplasm of bladder, unspecified: Secondary | ICD-10-CM | POA: Diagnosis not present

## 2017-08-25 DIAGNOSIS — K5900 Constipation, unspecified: Secondary | ICD-10-CM | POA: Diagnosis present

## 2017-08-25 DIAGNOSIS — S82892A Other fracture of left lower leg, initial encounter for closed fracture: Secondary | ICD-10-CM

## 2017-08-25 DIAGNOSIS — W19XXXA Unspecified fall, initial encounter: Secondary | ICD-10-CM | POA: Diagnosis not present

## 2017-08-25 DIAGNOSIS — S82832S Other fracture of upper and lower end of left fibula, sequela: Secondary | ICD-10-CM | POA: Diagnosis not present

## 2017-08-25 DIAGNOSIS — S0990XA Unspecified injury of head, initial encounter: Secondary | ICD-10-CM | POA: Diagnosis not present

## 2017-08-25 DIAGNOSIS — S199XXA Unspecified injury of neck, initial encounter: Secondary | ICD-10-CM | POA: Diagnosis not present

## 2017-08-25 DIAGNOSIS — I482 Chronic atrial fibrillation, unspecified: Secondary | ICD-10-CM | POA: Diagnosis present

## 2017-08-25 DIAGNOSIS — Z87891 Personal history of nicotine dependence: Secondary | ICD-10-CM

## 2017-08-25 DIAGNOSIS — T148XXA Other injury of unspecified body region, initial encounter: Secondary | ICD-10-CM

## 2017-08-25 DIAGNOSIS — R278 Other lack of coordination: Secondary | ICD-10-CM | POA: Diagnosis not present

## 2017-08-25 DIAGNOSIS — E039 Hypothyroidism, unspecified: Secondary | ICD-10-CM | POA: Diagnosis not present

## 2017-08-25 DIAGNOSIS — Z881 Allergy status to other antibiotic agents status: Secondary | ICD-10-CM | POA: Diagnosis not present

## 2017-08-25 DIAGNOSIS — I110 Hypertensive heart disease with heart failure: Secondary | ICD-10-CM | POA: Diagnosis present

## 2017-08-25 DIAGNOSIS — D72829 Elevated white blood cell count, unspecified: Secondary | ICD-10-CM | POA: Diagnosis present

## 2017-08-25 DIAGNOSIS — G8911 Acute pain due to trauma: Secondary | ICD-10-CM | POA: Diagnosis not present

## 2017-08-25 DIAGNOSIS — Z79899 Other long term (current) drug therapy: Secondary | ICD-10-CM

## 2017-08-25 DIAGNOSIS — S82392A Other fracture of lower end of left tibia, initial encounter for closed fracture: Secondary | ICD-10-CM | POA: Diagnosis not present

## 2017-08-25 DIAGNOSIS — Y92003 Bedroom of unspecified non-institutional (private) residence as the place of occurrence of the external cause: Secondary | ICD-10-CM | POA: Diagnosis not present

## 2017-08-25 DIAGNOSIS — Z7901 Long term (current) use of anticoagulants: Secondary | ICD-10-CM

## 2017-08-25 DIAGNOSIS — M6281 Muscle weakness (generalized): Secondary | ICD-10-CM | POA: Diagnosis not present

## 2017-08-25 DIAGNOSIS — S82432D Displaced oblique fracture of shaft of left fibula, subsequent encounter for closed fracture with routine healing: Secondary | ICD-10-CM | POA: Diagnosis not present

## 2017-08-25 DIAGNOSIS — R9431 Abnormal electrocardiogram [ECG] [EKG]: Secondary | ICD-10-CM | POA: Diagnosis not present

## 2017-08-25 DIAGNOSIS — Y92009 Unspecified place in unspecified non-institutional (private) residence as the place of occurrence of the external cause: Secondary | ICD-10-CM | POA: Diagnosis not present

## 2017-08-25 DIAGNOSIS — S82842A Displaced bimalleolar fracture of left lower leg, initial encounter for closed fracture: Secondary | ICD-10-CM | POA: Diagnosis not present

## 2017-08-25 DIAGNOSIS — S8252XS Displaced fracture of medial malleolus of left tibia, sequela: Secondary | ICD-10-CM | POA: Diagnosis not present

## 2017-08-25 DIAGNOSIS — I10 Essential (primary) hypertension: Secondary | ICD-10-CM | POA: Diagnosis not present

## 2017-08-25 DIAGNOSIS — M5030 Other cervical disc degeneration, unspecified cervical region: Secondary | ICD-10-CM | POA: Diagnosis not present

## 2017-08-25 DIAGNOSIS — S8252XA Displaced fracture of medial malleolus of left tibia, initial encounter for closed fracture: Secondary | ICD-10-CM | POA: Diagnosis not present

## 2017-08-25 DIAGNOSIS — M79672 Pain in left foot: Secondary | ICD-10-CM | POA: Diagnosis not present

## 2017-08-25 DIAGNOSIS — Z4789 Encounter for other orthopedic aftercare: Secondary | ICD-10-CM | POA: Diagnosis not present

## 2017-08-25 DIAGNOSIS — G8918 Other acute postprocedural pain: Secondary | ICD-10-CM | POA: Diagnosis not present

## 2017-08-25 DIAGNOSIS — S8002XA Contusion of left knee, initial encounter: Secondary | ICD-10-CM | POA: Diagnosis not present

## 2017-08-25 DIAGNOSIS — R41841 Cognitive communication deficit: Secondary | ICD-10-CM | POA: Diagnosis not present

## 2017-08-25 LAB — CBC WITH DIFFERENTIAL/PLATELET
BASOS ABS: 0.1 10*3/uL (ref 0.0–0.1)
BASOS PCT: 0 %
Eosinophils Absolute: 0.1 10*3/uL (ref 0.0–0.7)
Eosinophils Relative: 1 %
HEMATOCRIT: 41.4 % (ref 36.0–46.0)
HEMOGLOBIN: 14.1 g/dL (ref 12.0–15.0)
LYMPHS PCT: 10 %
Lymphs Abs: 1.1 10*3/uL (ref 0.7–4.0)
MCH: 32 pg (ref 26.0–34.0)
MCHC: 34.1 g/dL (ref 30.0–36.0)
MCV: 94.1 fL (ref 78.0–100.0)
MONO ABS: 0.9 10*3/uL (ref 0.1–1.0)
Monocytes Relative: 8 %
NEUTROS ABS: 9.1 10*3/uL — AB (ref 1.7–7.7)
NEUTROS PCT: 81 %
Platelets: 229 10*3/uL (ref 150–400)
RBC: 4.4 MIL/uL (ref 3.87–5.11)
RDW: 13.6 % (ref 11.5–15.5)
WBC: 11.2 10*3/uL — ABNORMAL HIGH (ref 4.0–10.5)

## 2017-08-25 LAB — BASIC METABOLIC PANEL
ANION GAP: 8 (ref 5–15)
BUN: 18 mg/dL (ref 6–20)
CALCIUM: 8.9 mg/dL (ref 8.9–10.3)
CO2: 26 mmol/L (ref 22–32)
Chloride: 102 mmol/L (ref 101–111)
Creatinine, Ser: 0.71 mg/dL (ref 0.44–1.00)
GLUCOSE: 94 mg/dL (ref 65–99)
POTASSIUM: 4 mmol/L (ref 3.5–5.1)
Sodium: 136 mmol/L (ref 135–145)

## 2017-08-25 LAB — PROTIME-INR
INR: 1.36
Prothrombin Time: 16.6 seconds — ABNORMAL HIGH (ref 11.4–15.2)

## 2017-08-25 LAB — TYPE AND SCREEN
ABO/RH(D): O POS
ANTIBODY SCREEN: NEGATIVE

## 2017-08-25 LAB — BRAIN NATRIURETIC PEPTIDE: B Natriuretic Peptide: 1571.7 pg/mL — ABNORMAL HIGH (ref 0.0–100.0)

## 2017-08-25 LAB — APTT: aPTT: 35 seconds (ref 24–36)

## 2017-08-25 MED ORDER — OXYCODONE-ACETAMINOPHEN 5-325 MG PO TABS
1.0000 | ORAL_TABLET | ORAL | Status: DC | PRN
  Administered 2017-08-25 – 2017-09-01 (×16): 1 via ORAL
  Filled 2017-08-25 (×18): qty 1

## 2017-08-25 MED ORDER — SALINE SPRAY 0.65 % NA SOLN
1.0000 | Freq: Every day | NASAL | Status: DC | PRN
  Filled 2017-08-25: qty 44

## 2017-08-25 MED ORDER — ACETAMINOPHEN 500 MG PO TABS
500.0000 mg | ORAL_TABLET | Freq: Four times a day (QID) | ORAL | Status: DC | PRN
  Administered 2017-08-25 – 2017-08-28 (×2): 500 mg via ORAL
  Filled 2017-08-25 (×2): qty 1

## 2017-08-25 MED ORDER — FUROSEMIDE 20 MG PO TABS
20.0000 mg | ORAL_TABLET | Freq: Every day | ORAL | Status: DC
  Administered 2017-08-25 – 2017-08-26 (×2): 20 mg via ORAL
  Filled 2017-08-25 (×2): qty 1

## 2017-08-25 MED ORDER — HYDRALAZINE HCL 20 MG/ML IJ SOLN: 5.0000 mg | INTRAMUSCULAR | Status: DC | PRN

## 2017-08-25 MED ORDER — ONDANSETRON HCL 4 MG/2ML IJ SOLN: 4.0000 mg | Freq: Three times a day (TID) | INTRAMUSCULAR | Status: DC | PRN

## 2017-08-25 MED ORDER — HEPARIN SODIUM (PORCINE) 5000 UNIT/ML IJ SOLN
5000.0000 [IU] | Freq: Three times a day (TID) | INTRAMUSCULAR | Status: DC
  Administered 2017-08-25 – 2017-08-26 (×3): 5000 [IU] via SUBCUTANEOUS
  Filled 2017-08-25 (×3): qty 1

## 2017-08-25 MED ORDER — ZOLPIDEM TARTRATE 5 MG PO TABS: 5.0000 mg | ORAL_TABLET | Freq: Every evening | ORAL | Status: DC | PRN

## 2017-08-25 MED ORDER — METOPROLOL TARTRATE 25 MG PO TABS
25.0000 mg | ORAL_TABLET | Freq: Two times a day (BID) | ORAL | Status: DC
  Administered 2017-08-25 – 2017-09-01 (×13): 25 mg via ORAL
  Filled 2017-08-25 (×14): qty 1

## 2017-08-25 MED ORDER — MORPHINE SULFATE (PF) 4 MG/ML IV SOLN
0.5000 mg | INTRAVENOUS | Status: DC | PRN
  Administered 2017-08-25 – 2017-08-29 (×2): 0.52 mg via INTRAVENOUS
  Filled 2017-08-25 (×2): qty 1

## 2017-08-25 MED ORDER — ACETAMINOPHEN 500 MG PO TABS
500.0000 mg | ORAL_TABLET | Freq: Once | ORAL | Status: AC
  Administered 2017-08-25: 500 mg via ORAL
  Filled 2017-08-25: qty 1

## 2017-08-25 MED ORDER — METHOCARBAMOL 500 MG PO TABS
500.0000 mg | ORAL_TABLET | Freq: Three times a day (TID) | ORAL | Status: DC | PRN
  Administered 2017-08-26 – 2017-08-27 (×2): 500 mg via ORAL
  Filled 2017-08-25 (×3): qty 1

## 2017-08-25 MED ORDER — POLYETHYLENE GLYCOL 3350 17 G PO PACK
17.0000 g | PACK | Freq: Every day | ORAL | Status: DC | PRN
  Administered 2017-08-30: 17 g via ORAL
  Filled 2017-08-25: qty 1

## 2017-08-25 MED ORDER — MORPHINE SULFATE (PF) 2 MG/ML IV SOLN: 0.5000 mg | INTRAVENOUS | Status: DC | PRN

## 2017-08-25 MED ORDER — AMLODIPINE BESYLATE 5 MG PO TABS
2.5000 mg | ORAL_TABLET | Freq: Every day | ORAL | Status: DC
  Administered 2017-08-25 – 2017-09-01 (×5): 2.5 mg via ORAL
  Filled 2017-08-25 (×7): qty 1

## 2017-08-25 MED ORDER — AMIODARONE HCL 200 MG PO TABS
200.0000 mg | ORAL_TABLET | Freq: Every day | ORAL | Status: DC
  Administered 2017-08-26 – 2017-09-01 (×6): 200 mg via ORAL
  Filled 2017-08-25 (×7): qty 1

## 2017-08-25 MED ORDER — POLYVINYL ALCOHOL 1.4 % OP SOLN
1.0000 [drp] | Freq: Every day | OPHTHALMIC | Status: DC | PRN
  Filled 2017-08-25: qty 15

## 2017-08-25 MED ORDER — LEVOTHYROXINE SODIUM 50 MCG PO TABS
50.0000 ug | ORAL_TABLET | Freq: Every day | ORAL | Status: DC
  Administered 2017-08-26 – 2017-09-01 (×6): 50 ug via ORAL
  Filled 2017-08-25 (×6): qty 1

## 2017-08-25 NOTE — Consult Note (Signed)
Consult acknowledged with formal consult to follow in the AM. 83 year old female with SER4 ankle fracture with subluxation of the talus. I recommended an attempt in improved alignment with splinting. The fracture is likely operative but swelling would prohibit definitive fixation acutely. Will obtain post splinting films.  Recommendations: NWB LLE Delayed ORIF on semi-elective basis, likely late next week PT/OT Post splinting films (ordered)  Shona Needles, MD [facility] [phone] (phone)

## 2017-08-25 NOTE — ED Triage Notes (Signed)
Per EMS- Patient fell at 0300 today while trying to get to the bedside commode. EMS came ,but patient stayed at home after being assisted back to bed. Patient was unable to bear weight this AM and called EMS- Patient has left ankle swelling.

## 2017-08-25 NOTE — ED Notes (Signed)
Attempted blood draw, was unsuccessful X2

## 2017-08-25 NOTE — ED Notes (Signed)
ED TO INPATIENT HANDOFF REPORT  Name/Age/Gender Rebecca Norman 83 y.o. female  Code Status    Code Status Orders  (From admission, onward)        Start     Ordered   08/25/17 1607  Full code  Continuous     08/25/17 1609    Code Status History    Date Active Date Inactive Code Status Order ID Comments User Context   This patient has a current code status but no historical code status.    Advance Directive Documentation     Most Recent Value  Type of Advance Directive  Healthcare Power of Attorney, Living will  Pre-existing out of facility DNR order (yellow form or pink MOST form)  No data  "MOST" Form in Place?  No data      Home/SNF/Other Home  Chief Complaint fall   Level of Care/Admitting Diagnosis ED Disposition    ED Disposition Condition Comment   Admit  Hospital Area: [facility] [[id number]]  Level of Care: Telemetry [5]  Admit to tele based on following criteria: Other see comments  Comments: a fib  Diagnosis: Closed left ankle fracture [[id number]]  Admitting Physician: Ivor Costa [[id number]]  Attending Physician: Ivor Costa [phone]  Estimated length of stay: past midnight tomorrow  Certification:: I certify this patient will need inpatient services for at least 2 midnights  PT Class (Do Not Modify): Inpatient [101]  PT Acc Code (Do Not Modify): Private [1]       Medical History Past Medical History:  Diagnosis Date  . DDD (degenerative disc disease), cervical    severe/  auto fusion c2-c5  . Diverticulosis   . Hemorrhoids   . History of adenomatous polyp of colon    1995  adenomatous polyp/  1997 & 2003  hyperplastic polyp's  . History of bladder cancer urologist-  dr [name]   s/p  TURBT's  . History of cancer of ureter   . Hypertension   . Hypothyroid   . Nocturia   . Osteoarthritis   . Osteopenia     Allergies Allergies  Allergen Reactions  . Flagyl [Metronidazole] Hives  . Lisinopril Cough    IV  Location/Drains/Wounds Patient Lines/Drains/Airways Status   Active Line/Drains/Airways    Name:   Placement date:   Placement time:   Site:   Days:   Peripheral IV 08/08/17 Left;Lateral Forearm   08/08/17    1845    Forearm   17   External Urinary Catheter   08/09/17    0802    -   16          Labs/Imaging Results for orders placed or performed during the hospital encounter of 08/25/17 (from the past 48 hour(s))  Basic metabolic panel     Status: None   Collection Time: 08/25/17  1:20 PM  Result Value Ref Range   Sodium 136 135 - 145 mmol/L   Potassium 4.0 3.5 - 5.1 mmol/L   Chloride 102 101 - 111 mmol/L   CO2 26 22 - 32 mmol/L   Glucose, Bld 94 65 - 99 mg/dL   BUN 18 6 - 20 mg/dL   Creatinine, Ser 0.71 0.44 - 1.00 mg/dL   Calcium 8.9 8.9 - 10.3 mg/dL   GFR calc non Af Amer >60 >60 mL/min   GFR calc Af Amer >60 >60 mL/min    Comment: (NOTE) The eGFR has been calculated using the CKD EPI equation. This calculation has not been validated  in all clinical situations. eGFR's persistently <60 mL/min signify possible Chronic Kidney Disease.    Anion gap 8 5 - 15  CBC with Differential     Status: Abnormal   Collection Time: 08/25/17  1:20 PM  Result Value Ref Range   WBC 11.2 (H) 4.0 - 10.5 K/uL   RBC 4.40 3.87 - 5.11 MIL/uL   Hemoglobin 14.1 12.0 - 15.0 g/dL   HCT 41.4 36.0 - 46.0 %   MCV 94.1 78.0 - 100.0 fL   MCH 32.0 26.0 - 34.0 pg   MCHC 34.1 30.0 - 36.0 g/dL   RDW 13.6 11.5 - 15.5 %   Platelets 229 150 - 400 K/uL   Neutrophils Relative % 81 %   Neutro Abs 9.1 (H) 1.7 - 7.7 K/uL   Lymphocytes Relative 10 %   Lymphs Abs 1.1 0.7 - 4.0 K/uL   Monocytes Relative 8 %   Monocytes Absolute 0.9 0.1 - 1.0 K/uL   Eosinophils Relative 1 %   Eosinophils Absolute 0.1 0.0 - 0.7 K/uL   Basophils Relative 0 %   Basophils Absolute 0.1 0.0 - 0.1 K/uL  Brain natriuretic peptide     Status: Abnormal   Collection Time: 08/25/17  7:12 PM  Result Value Ref Range   B Natriuretic  Peptide 1,571.7 (H) 0.0 - 100.0 pg/mL  Protime-INR     Status: Abnormal   Collection Time: 08/25/17  7:12 PM  Result Value Ref Range   Prothrombin Time 16.6 (H) 11.4 - 15.2 seconds   INR 1.36   APTT     Status: None   Collection Time: 08/25/17  7:12 PM  Result Value Ref Range   aPTT 35 24 - 36 seconds   Dg Ankle Complete Left  Result Date: 08/25/2017 CLINICAL DATA:  83 year old female with distal fibular fracture. EXAM: LEFT ANKLE COMPLETE - 3+ VIEW COMPARISON:  CT dated 08/25/2017 FINDINGS: Mildly displaced oblique fracture of the distal fibula as seen on the prior CT. There is associated approximately 4 mm lateral subluxation of the ankle mortise. The bones are osteopenic. Degenerative changes of the tarsometatarsal joints. There has been interval placement of a cast. There is soft tissue swelling and edema. IMPRESSION: Mildly displaced fracture of the distal fibula and mild lateral subluxation of the ankle mortise status post cast placement. Electronically Signed   By: Anner Crete M.D.   On: 08/25/2017 20:36   Dg Ankle Complete Left  Result Date: 08/25/2017 CLINICAL DATA:  [place] today at home going to a bathroom; twisted left ankle; pain in lateral side of ankle and radiates to left foot; hx fx about 30 years ago per pt EXAM: LEFT ANKLE COMPLETE - 3+ VIEW COMPARISON:  None. FINDINGS: There is an oblique fracture of the distal fibula extending from the lateral distal diaphysis to the medial metaphysis. The distal fracture component is displaced laterally by 6 mm. There are multiple small bone fragments between the medial malleolus and medial talus consistent with small avulsion fractures. The talus has subluxed laterally by 6 mm. No other fractures. There is diffuse surrounding soft tissue swelling. IMPRESSION: 1. Mildly displaced distal fibular fracture. Multiple small avulsion fractures between the medial malleolus and talus, likely arising from the talus, consistent with avulsions at the  deep deltoid ligament insertion. Talus is subluxed laterally by 6 mm. Electronically Signed   By: Lajean Manes M.D.   On: 08/25/2017 12:23   Ct Head Wo Contrast  Result Date: 08/25/2017 CLINICAL DATA:  Fall with head  trauma.  Ataxia. EXAM: CT HEAD WITHOUT CONTRAST CT CERVICAL SPINE WITHOUT CONTRAST TECHNIQUE: Multidetector CT imaging of the head and cervical spine was performed following the standard protocol without intravenous contrast. Multiplanar CT image reconstructions of the cervical spine were also generated. COMPARISON:  None. FINDINGS: CT HEAD FINDINGS Brain: No evidence of acute infarction, hemorrhage, hydrocephalus, extra-axial collection or mass lesion/mass effect. 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 Cortical/sulcal calcification left posterior frontal, nonspecific and incidental in isolation. Generalized volume loss. Vascular: Atherosclerotic calcification.  No hyperdense vessel. Skull: Negative for fracture Sinuses/Orbits: Bilateral cataract resection. No evidence of injury. CT CERVICAL SPINE FINDINGS Alignment: No traumatic malalignment. Degenerative facet mediated C7-T1 mild anterolisthesis. Skull base and vertebrae: Negative for fracture. Soft tissues and spinal canal: No gross canal hematoma or prevertebral edema. Retropharyngeal course of the carotids incidentally noted. Asymmetric fatty atrophy of the left parotid. Disc levels: Facet ankylosis on the right from C2-C5. Left-sided facet ankylosis C2 -C4. Disc degeneration with advanced height loss and spurring at C5-6 and C6-7. Asymmetric left facet arthropathy at C7-T1 with anterolisthesis and foraminal narrowing. Upper chest: Pleural effusions and septal lines consistent with edema. There is superimposed emphysematous markings. IMPRESSION: 1. Pulmonary edema and pleural effusions. 2. No evidence of acute intracranial or cervical spine injury. Electronically Signed   By: Monte Fantasia M.D.   On: 08/25/2017 16:32   Ct  Cervical Spine Wo Contrast  Result Date: 08/25/2017 CLINICAL DATA:  Fall with head trauma.  Ataxia. EXAM: CT HEAD WITHOUT CONTRAST CT CERVICAL SPINE WITHOUT CONTRAST TECHNIQUE: Multidetector CT imaging of the head and cervical spine was performed following the standard protocol without intravenous contrast. Multiplanar CT image reconstructions of the cervical spine were also generated. COMPARISON:  None. FINDINGS: CT HEAD FINDINGS Brain: No evidence of acute infarction, hemorrhage, hydrocephalus, extra-axial collection or mass lesion/mass effect. 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 Cortical/sulcal calcification left posterior frontal, nonspecific and incidental in isolation. Generalized volume loss. Vascular: Atherosclerotic calcification.  No hyperdense vessel. Skull: Negative for fracture Sinuses/Orbits: Bilateral cataract resection. No evidence of injury. CT CERVICAL SPINE FINDINGS Alignment: No traumatic malalignment. Degenerative facet mediated C7-T1 mild anterolisthesis. Skull base and vertebrae: Negative for fracture. Soft tissues and spinal canal: No gross canal hematoma or prevertebral edema. Retropharyngeal course of the carotids incidentally noted. Asymmetric fatty atrophy of the left parotid. Disc levels: Facet ankylosis on the right from C2-C5. Left-sided facet ankylosis C2 -C4. Disc degeneration with advanced height loss and spurring at C5-6 and C6-7. Asymmetric left facet arthropathy at C7-T1 with anterolisthesis and foraminal narrowing. Upper chest: Pleural effusions and septal lines consistent with edema. There is superimposed emphysematous markings. IMPRESSION: 1. Pulmonary edema and pleural effusions. 2. No evidence of acute intracranial or cervical spine injury. Electronically Signed   By: Monte Fantasia M.D.   On: 08/25/2017 16:32   Ct Ankle Left Wo Contrast  Result Date: 08/25/2017 CLINICAL DATA:  Left ankle fracture. EXAM: CT OF THE LEFT ANKLE WITHOUT CONTRAST TECHNIQUE:  Multidetector CT imaging of the left ankle was performed according to the standard protocol. Multiplanar CT image reconstructions were also generated. COMPARISON:  None. FINDINGS: Bones/Joint/Cartilage Oblique fracture of the distal fibular metaphysis with 6 mm of lateral displacement and 4 mm of posterior displacement. Sliver of bone along the lateral margin of the medial malleolus most consistent with an avulsion fracture with widening of the joint space of the medial malleolus-talus. Tiny fracture of the posterior malleolus of the tibia. Moderate joint space narrowing of the  tibiotalar joint most consistent with osteoarthritis. No other fracture or dislocation. Generalized osteopenia. Subtalar joints are normal. Moderate osteoarthritis of the second and third tarsometatarsal joints. Mild osteoarthritis of the fourth tarsometatarsal joint. Small joint effusion. Ligaments Ligaments are suboptimally evaluated by CT. Muscles and Tendons Muscles are normal. No muscle atrophy. Flexor, extensor, peroneal and Achilles tendons are grossly intact. Soft tissue No fluid collection or hematoma.  No soft tissue mass. IMPRESSION: 1. Oblique fracture of the distal fibular metaphysis with 6 mm of lateral displacement and 4 mm of posterior displacement. 2. Sliver of bone along the lateral margin of the medial malleolus most consistent with an avulsion fracture with widening of the joint space of the medial malleolus-talus. Tiny fracture of the posterior malleolus of the tibia. Moderate joint space narrowing of the tibiotalar joint most consistent with osteoarthritis. Electronically Signed   By: Kathreen Devoid   On: 08/25/2017 13:45   Dg Foot Complete Left  Result Date: 08/25/2017 CLINICAL DATA:  [place] today at home going to a bathroom; twisted left ankle; pain in lateral side of ankle and radiates to left foot; hx fx about 30 years ago per pt EXAM: LEFT FOOT - COMPLETE 3+ VIEW COMPARISON:  None. FINDINGS: The fracture of the  distal fibula and avulsion fractures from the medial talus are again noted as is lateral talar subluxation. There are no foot fractures. There is a marked hallux valgus deformity. No dislocation. Degenerative spurring is noted at the cuneiform metatarsal articulation on the lateral view. Bones are demineralized. IMPRESSION: 1. No foot fracture or dislocation. Electronically Signed   By: Lajean Manes M.D.   On: 08/25/2017 12:24    Pending Labs Unresulted Labs (From admission, onward)   Start     Ordered   08/26/17 0500  CBC  Tomorrow morning,   R     08/25/17 1609   08/26/17 7989  Basic metabolic panel  Tomorrow morning,   R     08/25/17 1609   08/25/17 1611  Type and screen [place]  Once,   R    Comments:  [place]    08/25/17 1610      Vitals/Pain Today's Vitals   08/25/17 1830 08/25/17 1832 08/25/17 1900 08/25/17 1919  BP: (!) 146/78  (!) 132/114   Pulse: 63  71   Resp: 12  (!) 24   Temp:      TempSrc:      SpO2: 94%  92%   Weight:      Height:      PainSc:  8   2     Isolation Precautions No active isolations  Medications Medications  acetaminophen (TYLENOL) tablet 500 mg (500 mg Oral Given 08/25/17 1639)  amiodarone (PACERONE) tablet 200 mg (not administered)  amLODipine (NORVASC) tablet 2.5 mg (2.5 mg Oral Given 08/25/17 1803)  furosemide (LASIX) tablet 20 mg (20 mg Oral Given 08/25/17 1802)  levothyroxine (SYNTHROID, LEVOTHROID) tablet 50 mcg (not administered)  metoprolol tartrate (LOPRESSOR) tablet 25 mg (not administered)  polyvinyl alcohol (LIQUIFILM TEARS) 1.4 % ophthalmic solution 1 drop (not administered)  sodium chloride (OCEAN) 0.65 % nasal spray 1 spray (not administered)  oxyCODONE-acetaminophen (PERCOCET/ROXICET) 5-325 MG per tablet 1 tablet (1 tablet Oral Given 08/25/17 1832)  morphine 2 MG/ML injection 0.5 mg (not administered)  methocarbamol (ROBAXIN) tablet 500 mg (not administered)  heparin injection  5,000 Units (not administered)  polyethylene glycol (MIRALAX / GLYCOLAX) packet 17 g (not administered)  ondansetron (ZOFRAN) injection  4 mg (not administered)  hydrALAZINE (APRESOLINE) injection 5 mg (not administered)  zolpidem (AMBIEN) tablet 5 mg (not administered)  acetaminophen (TYLENOL) tablet 500 mg (500 mg Oral Given 08/25/17 1316)    Mobility non-ambulatory

## 2017-08-25 NOTE — H&P (Signed)
History and Physical    Rebecca Norman NTI:[id number] DOB: [birth year] DOA: 08/25/2017  Referring MD/NP/PA:   PCP: [name], MD   Patient coming from:  The patient is coming from home.  At baseline, pt is independent for most of ADL.   Chief Complaint: fall and left ankle pain  HPI: Rebecca Norman is a 83 y.o. female with medical history significant of hypertension, hypothyroidism, bladder cancer (s/p of TURB), DDD, diverticulosis, atrial fibrillation on Eliquis, dCHF, who presents with fall and left ankle pain.  Patient states that she fell when she was trying to move from bed to commode in the midnight. She twisted her left ankle and developed left ankle pain. Patient states that her left ankle pain is constant, sharp, moderate, nonradiating.  No left leg numbness.  She did not loss consciousness.  No unilateral weakness, vision change, hearing loss, slurred speech or facial droop.  She does not have headache or neck pain.  Patient denies chest pain, shortness breath, nausea vomiting, diarrhea, abdominal pain, symptoms of a UTI or unilateral weakness.    ED Course: pt was found to have WBC 11.2, electrolytes renal function okay, temperature normal, bradycardia, no tachypnea, oxygen sats are 90-99% on room air. CT-head and neck negative for intracranial or bony abnormality, no showed pulmonary edema. Pt is admitted to telemetry as inpatient. Dr. Doreatha Martin of ortho was consulted.  CT-left ankle showed: 1. Oblique fracture of the distal fibular metaphysis with 6 mm of lateral displacement and 4 mm of posterior displacement. 2. Sliver of bone along the lateral margin of the medial malleolus most consistent with an avulsion fracture with widening of the joint space of the medial malleolus-talus. Tiny fracture of the posterior malleolus of the tibia. Moderate joint space narrowing of the tibiotalar joint most consistent with osteoarthritis.  Review of Systems:   General: no fevers, chills,  no body weight gain, has fatigue HEENT: no blurry vision, hearing changes or sore throat Respiratory: no dyspnea, coughing, wheezing CV: no chest pain, no palpitations GI: no nausea, vomiting, abdominal pain, diarrhea, constipation GU: no dysuria, burning on urination, increased urinary frequency, hematuria  Ext: has mild leg edema Neuro: no unilateral weakness, numbness, or tingling, no vision change or hearing loss. Had fall. Skin: no rash, no skin tear. MSK: has left ankle pain Heme: No easy bruising.  Travel history: No recent long distant travel.  Allergy:  Allergies  Allergen Reactions  . Flagyl [Metronidazole] Hives  . Lisinopril Cough    Past Medical History:  Diagnosis Date  . DDD (degenerative disc disease), cervical    severe/  auto fusion c2-c5  . Diverticulosis   . Hemorrhoids   . History of adenomatous polyp of colon    1995  adenomatous polyp/  1997 & 2003  hyperplastic polyp's  . History of bladder cancer urologist-  dr Tresa Norman   s/p  TURBT's  . History of cancer of ureter   . Hypertension   . Hypothyroid   . Nocturia   . Osteoarthritis   . Osteopenia     Past Surgical History:  Procedure Laterality Date  . BACK SURGERY    . CARDIOVERSION N/A 08/09/2017   Procedure: CARDIOVERSION;  Surgeon: Larey Dresser, MD;  Location: [facility] ENDOSCOPY;  Service: Cardiovascular;  Laterality: N/A;  . CATARACT EXTRACTION W/ INTRAOCULAR LENS  IMPLANT, BILATERAL Bilateral   . CYSTOSCOPY W/ RETROGRADES  10/03/2011   Procedure: CYSTOSCOPY WITH RETROGRADE PYELOGRAM;  Surgeon: Molli Hazard, MD;  Location: [facility];  Service: Urology;;  . Consuela Mimes W/ RETROGRADES Bilateral 06/17/2016   Procedure: CYSTOSCOPY WITH RETROGRADE PYELOGRAM;  Surgeon: Alexis Frock, MD;  Location: [facility];  Service: Urology;  Laterality: Bilateral;  . CYSTOSCOPY WITH BIOPSY N/A 06/17/2016   Procedure: CYSTOSCOPY WITH BIOPSY AND FULGERATION;  Surgeon: Alexis Frock, MD;  Location: [facility];  Service: Urology;  Laterality: N/A;  . LAMINECTOMY  2009   L3 - 4  . TRANSURETHRAL RESECTION OF BLADDER  x3 --  2009; 2010; 06-29-2010  . TRANSURETHRAL RESECTION OF BLADDER TUMOR  10/03/2011   Procedure: TRANSURETHRAL RESECTION OF BLADDER TUMOR (TURBT);  Surgeon: Molli Hazard, MD;  Location: [facility];  Service: Urology;  Laterality: N/A;    Social History:  reports that she quit smoking about 11 years ago. Her smoking use included cigarettes. She quit after 10.00 years of use. she has never used smokeless tobacco. She reports that she drinks alcohol. She reports that she does not use drugs.  Family History:  Family History  Problem Relation Age of Onset  . Breast cancer Sister   . Stomach cancer Sister   . Hernia Sister 56       HERNIA SURGERY COMPLICATIONS  . Colon cancer Neg Hx      Prior to Admission medications   Medication Sig Start Date End Date Taking? Authorizing Provider  acetaminophen (TYLENOL) 500 MG tablet Take 500 mg by mouth every 6 (six) hours as needed for headache (pain).    Yes [provider]  amiodarone (PACERONE) 200 MG tablet Take 1 tablet (200 mg total) by mouth daily. 08/25/17  Yes Almyra Deforest, PA  apixaban (ELIQUIS) 5 MG TABS tablet Take 1 tablet (5 mg total) by mouth 2 (two) times daily. 07/26/17  Yes Sherran Needs, NP  furosemide (LASIX) 20 MG tablet Take 1 tablet (20 mg total) by mouth daily. 08/12/17  Yes Strader, [place] M, PA-C  levothyroxine (SYNTHROID, LEVOTHROID) 50 MCG tablet Take 50 mcg by mouth daily before breakfast.   Yes [provider]  metoprolol tartrate (LOPRESSOR) 25 MG tablet Take 1 tablet (25 mg total) by mouth 2 (two) times daily. 08/11/17 11/09/17 Yes Strader, Fransisco Hertz, PA-C  polyvinyl alcohol (ARTIFICIAL TEARS) 1.4 % ophthalmic solution Place 1 drop into both eyes daily as needed for dry eyes (itching).   Yes [provider]  sodium  chloride (OCEAN) 0.65 % SOLN nasal spray Place 1 spray into both nostrils daily as needed for congestion.   Yes [provider]  amLODipine (NORVASC) 2.5 MG tablet Take 1 tablet (2.5 mg total) by mouth daily. 08/23/17 11/21/17  Almyra Deforest, PA    Physical Exam: Vitals:   08/25/17 1500 08/25/17 1530 08/25/17 1533 08/25/17 1604  BP: (!) 175/67 (!) 165/66  (!) 165/66  Pulse: 72 62  (!) 59  Resp:    16  Temp:      TempSrc:      SpO2: 93% 96% 96% 96%  Weight:      Height:       General: Not in acute distress HEENT:       Eyes: PERRL, EOMI, no scleral icterus.       ENT: No discharge from the ears and nose, no pharynx injection, no tonsillar enlargement.        Neck: No JVD, no bruit, no mass felt. Heme: No neck lymph node enlargement. Cardiac: S1/S2, RRR, No murmurs, No gallops or rubs. Respiratory: No rales, wheezing,  rhonchi or rubs. GI: Soft, nondistended, nontender, no rebound pain, no organomegaly, BS present. GU: No hematuria Ext: trace leg edema bilaterally. 2+DP/PT pulse bilaterally. Musculoskeletal: has left ankle pain, splint was applied. Skin: No rashes.  Neuro: Alert, oriented X3, cranial nerves II-XII grossly intact, moves all extremities. Psych: Patient is not psychotic, no suicidal or hemocidal ideation.  Labs on Admission: I have personally reviewed following labs and imaging studies  CBC: Recent Labs  Lab 08/25/17 1320  WBC 11.2*  NEUTROABS 9.1*  HGB 14.1  HCT 41.4  MCV 94.1  PLT 643   Basic Metabolic Panel: Recent Labs  Lab 08/23/17 1118 08/25/17 1320  NA 138 136  K 4.7 4.0  CL 99 102  CO2 24 26  GLUCOSE 94 94  BUN 17 18  CREATININE 0.87 0.71  CALCIUM 9.4 8.9   GFR: Estimated Creatinine Clearance: 47 mL/min (by C-G formula based on SCr of 0.71 mg/dL). Liver Function Tests: No results for input(s): AST, ALT, ALKPHOS, BILITOT, PROT, ALBUMIN in the last 168 hours. No results for input(s): LIPASE, AMYLASE in the last 168 hours. No results  for input(s): AMMONIA in the last 168 hours. Coagulation Profile: No results for input(s): INR, PROTIME in the last 168 hours. Cardiac Enzymes: No results for input(s): CKTOTAL, CKMB, CKMBINDEX, TROPONINI in the last 168 hours. BNP (last 3 results) No results for input(s): PROBNP in the last 8760 hours. HbA1C: No results for input(s): HGBA1C in the last 72 hours. CBG: No results for input(s): GLUCAP in the last 168 hours. Lipid Profile: No results for input(s): CHOL, HDL, LDLCALC, TRIG, CHOLHDL, LDLDIRECT in the last 72 hours. Thyroid Function Tests: No results for input(s): TSH, T4TOTAL, FREET4, T3FREE, THYROIDAB in the last 72 hours. Anemia Panel: No results for input(s): VITAMINB12, FOLATE, FERRITIN, TIBC, IRON, RETICCTPCT in the last 72 hours. Urine analysis:    Component Value Date/Time   COLORURINE YELLOW 12/30/2008 [place] 12/30/2008 1535   LABSPEC 1.019 12/30/2008 1535   PHURINE 5.5 12/30/2008 1535   GLUCOSEU NEGATIVE 12/30/2008 1535   HGBUR SMALL (A) 12/30/2008 1535   BILIRUBINUR NEGATIVE 12/30/2008 1535   KETONESUR NEGATIVE 12/30/2008 1535   PROTEINUR NEGATIVE 12/30/2008 1535   UROBILINOGEN 0.2 12/30/2008 1535   NITRITE NEGATIVE 12/30/2008 1535   LEUKOCYTESUR NEGATIVE 12/30/2008 1535   Sepsis Labs: @LABRCNTIP (procalcitonin:4,lacticidven:4) )No results found for this or any previous visit (from the past 240 hour(s)).   Radiological Exams on Admission: Dg Ankle Complete Left  Result Date: 08/25/2017 CLINICAL DATA:  [place] today at home going to a bathroom; twisted left ankle; pain in lateral side of ankle and radiates to left foot; hx fx about 30 years ago per pt EXAM: LEFT ANKLE COMPLETE - 3+ VIEW COMPARISON:  None. FINDINGS: There is an oblique fracture of the distal fibula extending from the lateral distal diaphysis to the medial metaphysis. The distal fracture component is displaced laterally by 6 mm. There are multiple small bone fragments between  the medial malleolus and medial talus consistent with small avulsion fractures. The talus has subluxed laterally by 6 mm. No other fractures. There is diffuse surrounding soft tissue swelling. IMPRESSION: 1. Mildly displaced distal fibular fracture. Multiple small avulsion fractures between the medial malleolus and talus, likely arising from the talus, consistent with avulsions at the deep deltoid ligament insertion. Talus is subluxed laterally by 6 mm. Electronically Signed   By: Lajean Manes M.D.   On: 08/25/2017 12:23   Ct Head Wo Contrast  Result Date:  08/25/2017 CLINICAL DATA:  Fall with head trauma.  Ataxia. EXAM: CT HEAD WITHOUT CONTRAST CT CERVICAL SPINE WITHOUT CONTRAST TECHNIQUE: Multidetector CT imaging of the head and cervical spine was performed following the standard protocol without intravenous contrast. Multiplanar CT image reconstructions of the cervical spine were also generated. COMPARISON:  None. FINDINGS: CT HEAD FINDINGS Brain: No evidence of acute infarction, hemorrhage, hydrocephalus, extra-axial collection or mass lesion/mass effect. 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 Cortical/sulcal calcification left posterior frontal, nonspecific and incidental in isolation. Generalized volume loss. Vascular: Atherosclerotic calcification.  No hyperdense vessel. Skull: Negative for fracture Sinuses/Orbits: Bilateral cataract resection. No evidence of injury. CT CERVICAL SPINE FINDINGS Alignment: No traumatic malalignment. Degenerative facet mediated C7-T1 mild anterolisthesis. Skull base and vertebrae: Negative for fracture. Soft tissues and spinal canal: No gross canal hematoma or prevertebral edema. Retropharyngeal course of the carotids incidentally noted. Asymmetric fatty atrophy of the left parotid. Disc levels: Facet ankylosis on the right from C2-C5. Left-sided facet ankylosis C2 -C4. Disc degeneration with advanced height loss and spurring at C5-6 and C6-7. Asymmetric left  facet arthropathy at C7-T1 with anterolisthesis and foraminal narrowing. Upper chest: Pleural effusions and septal lines consistent with edema. There is superimposed emphysematous markings. IMPRESSION: 1. Pulmonary edema and pleural effusions. 2. No evidence of acute intracranial or cervical spine injury. Electronically Signed   By: Monte Fantasia M.D.   On: 08/25/2017 16:32   Ct Cervical Spine Wo Contrast  Result Date: 08/25/2017 CLINICAL DATA:  Fall with head trauma.  Ataxia. EXAM: CT HEAD WITHOUT CONTRAST CT CERVICAL SPINE WITHOUT CONTRAST TECHNIQUE: Multidetector CT imaging of the head and cervical spine was performed following the standard protocol without intravenous contrast. Multiplanar CT image reconstructions of the cervical spine were also generated. COMPARISON:  None. FINDINGS: CT HEAD FINDINGS Brain: No evidence of acute infarction, hemorrhage, hydrocephalus, extra-axial collection or mass lesion/mass effect. 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 Cortical/sulcal calcification left posterior frontal, nonspecific and incidental in isolation. Generalized volume loss. Vascular: Atherosclerotic calcification.  No hyperdense vessel. Skull: Negative for fracture Sinuses/Orbits: Bilateral cataract resection. No evidence of injury. CT CERVICAL SPINE FINDINGS Alignment: No traumatic malalignment. Degenerative facet mediated C7-T1 mild anterolisthesis. Skull base and vertebrae: Negative for fracture. Soft tissues and spinal canal: No gross canal hematoma or prevertebral edema. Retropharyngeal course of the carotids incidentally noted. Asymmetric fatty atrophy of the left parotid. Disc levels: Facet ankylosis on the right from C2-C5. Left-sided facet ankylosis C2 -C4. Disc degeneration with advanced height loss and spurring at C5-6 and C6-7. Asymmetric left facet arthropathy at C7-T1 with anterolisthesis and foraminal narrowing. Upper chest: Pleural effusions and septal lines consistent with  edema. There is superimposed emphysematous markings. IMPRESSION: 1. Pulmonary edema and pleural effusions. 2. No evidence of acute intracranial or cervical spine injury. Electronically Signed   By: Monte Fantasia M.D.   On: 08/25/2017 16:32   Ct Ankle Left Wo Contrast  Result Date: 08/25/2017 CLINICAL DATA:  Left ankle fracture. EXAM: CT OF THE LEFT ANKLE WITHOUT CONTRAST TECHNIQUE: Multidetector CT imaging of the left ankle was performed according to the standard protocol. Multiplanar CT image reconstructions were also generated. COMPARISON:  None. FINDINGS: Bones/Joint/Cartilage Oblique fracture of the distal fibular metaphysis with 6 mm of lateral displacement and 4 mm of posterior displacement. Sliver of bone along the lateral margin of the medial malleolus most consistent with an avulsion fracture with widening of the joint space of the medial malleolus-talus. Tiny fracture of the posterior malleolus of the  tibia. Moderate joint space narrowing of the tibiotalar joint most consistent with osteoarthritis. No other fracture or dislocation. Generalized osteopenia. Subtalar joints are normal. Moderate osteoarthritis of the second and third tarsometatarsal joints. Mild osteoarthritis of the fourth tarsometatarsal joint. Small joint effusion. Ligaments Ligaments are suboptimally evaluated by CT. Muscles and Tendons Muscles are normal. No muscle atrophy. Flexor, extensor, peroneal and Achilles tendons are grossly intact. Soft tissue No fluid collection or hematoma.  No soft tissue mass. IMPRESSION: 1. Oblique fracture of the distal fibular metaphysis with 6 mm of lateral displacement and 4 mm of posterior displacement. 2. Sliver of bone along the lateral margin of the medial malleolus most consistent with an avulsion fracture with widening of the joint space of the medial malleolus-talus. Tiny fracture of the posterior malleolus of the tibia. Moderate joint space narrowing of the tibiotalar joint most consistent  with osteoarthritis. Electronically Signed   By: Kathreen Devoid   On: 08/25/2017 13:45   Dg Foot Complete Left  Result Date: 08/25/2017 CLINICAL DATA:  [place] today at home going to a bathroom; twisted left ankle; pain in lateral side of ankle and radiates to left foot; hx fx about 30 years ago per pt EXAM: LEFT FOOT - COMPLETE 3+ VIEW COMPARISON:  None. FINDINGS: The fracture of the distal fibula and avulsion fractures from the medial talus are again noted as is lateral talar subluxation. There are no foot fractures. There is a marked hallux valgus deformity. No dislocation. Degenerative spurring is noted at the cuneiform metatarsal articulation on the lateral view. Bones are demineralized. IMPRESSION: 1. No foot fracture or dislocation. Electronically Signed   By: Lajean Manes M.D.   On: 08/25/2017 12:24     EKG: Not done in ED, will get one.   Assessment/Plan Principal Problem:   Closed left ankle fracture Active Problems:   HTN (hypertension)   Hypothyroid   Atrial fibrillation, chronic (HCC)   Fall   Chronic diastolic CHF (congestive heart failure) (HCC)   Closed left ankle fracture: As evidenced by image. Patient has moderate pain now. No neurovascular compromise. Dr. Doreatha Martin of orthopedics was consulted.   - will admit to tele bed as inpt - Pain control: morphine prn and percocet - When necessary Zofran for nausea - Robaxin for muscle spasm - type and cross - INR/PTT  Leukocytosis: WBC 11.2. Likely due to stress-induced demargination. Patient does not have signs of infection. -Follow-up CBC  HTN:  -Continue home medications: Metoprolol, amlodipine -IV hydralazine prn -Patient is also on Lasix  Hypothyroidism: Last TSH was 2.929 on 08/06/16 -Continue home Synthroid  Atrial Fibrillation: CHA2DS2-VASc Score is 5, needs oral anticoagulation. Patient is on Eliquis at home. Heart rate is well controlled. -hold Eliquis for possible surgery -continue metoprolol and  amiodarone  Chronic diastolic CHF (congestive heart failure) ([place]): 2D echo on 06/29/17 showed EF 65%.  Patient has trace leg edema, no DVT.  No shortness of breath.  CT of head that showed pulmonary edema, but patient does not have respiratory distress. -Continue home dose of Lasix 20 mg daily -Check BNP   DVT ppx: SQ Heparin-->will need to be hold if decide to to surgery Code Status: Full code Family Communication:  Yes, patient's daughter at bed side Disposition Plan:  To be determined Consults called:  Ortho, Dr. Doreatha Martin Admission status:  Inpatient/tele   Date of Service 08/25/2017    Ivor Costa [facility] Pager [phone]  If 7PM-7AM, please contact night-coverage [email or web address] Password [facility] 08/25/2017, 5:37 PM

## 2017-08-25 NOTE — ED Notes (Signed)
Bed: VT91 Expected date:  Expected time:  Means of arrival:  Comments: Ems-LEFT ANKLE INJURY

## 2017-08-25 NOTE — ED Notes (Signed)
Called report to nurse, but the room is not ready. Nurse said that she will call back when the patient can come up

## 2017-08-25 NOTE — ED Provider Notes (Signed)
[facility] DEPT Provider Note   CSN: [id number] Arrival date & time: 08/25/17  1043     History   Chief Complaint Chief Complaint  Patient presents with  . Fall  . Joint Swelling    HPI UNA YEOMANS is a 83 y.o. female with a history of atrial fibrillation on Eliquis to the emergency department today because of fall.  Patient states that she was getting out of bed and twisted her ankle while attempting to get onto a bedside commode.  She denies head trauma or loss of consciousness.  She is now reporting significant pain of the left ankle with associated swelling.  She has not taken anything for this.  She denies any numbness or tingling to the extremity.  Patient denies any preceding symptoms.  She ambulates normally with a cane/walker.  She lives independently.  HPI  Past Medical History:  Diagnosis Date  . DDD (degenerative disc disease), cervical    severe/  auto fusion c2-c5  . Diverticulosis   . Hemorrhoids   . History of adenomatous polyp of colon    1995  adenomatous polyp/  1997 & 2003  hyperplastic polyp's  . History of bladder cancer urologist-  dr [name]   s/p  TURBT's  . History of cancer of ureter   . Hypertension   . Hypothyroid   . Nocturia   . Osteoarthritis   . Osteopenia     Patient Active Problem List   Diagnosis Date Noted  . Current use of long term anticoagulation 08/11/2017  . Atrial fibrillation with RVR ([place]) 08/06/2017  . Hypothyroid 06/13/2017  . Palpitations 06/13/2017  . Abnormal EKG 06/13/2017  . Heart murmur 06/13/2017  . S/P laminectomy 07/10/2013  . HTN (hypertension) 07/10/2013  . BLADDER CANCER 12/17/2008  . DIVERTICULOSIS OF COLON 12/12/2008  . COLONIC POLYPS, HYPERPLASTIC, HX OF 12/12/2008    Past Surgical History:  Procedure Laterality Date  . BACK SURGERY    . CARDIOVERSION N/A 08/09/2017   Procedure: CARDIOVERSION;  Surgeon: Larey Dresser, MD;  Location: [facility] ENDOSCOPY;  Service:  Cardiovascular;  Laterality: N/A;  . CATARACT EXTRACTION W/ INTRAOCULAR LENS  IMPLANT, BILATERAL Bilateral   . CYSTOSCOPY W/ RETROGRADES  10/03/2011   Procedure: CYSTOSCOPY WITH RETROGRADE PYELOGRAM;  Surgeon: Molli Hazard, MD;  Location: [facility];  Service: Urology;;  . Consuela Mimes W/ RETROGRADES Bilateral 06/17/2016   Procedure: CYSTOSCOPY WITH RETROGRADE PYELOGRAM;  Surgeon: Alexis Frock, MD;  Location: [facility];  Service: Urology;  Laterality: Bilateral;  . CYSTOSCOPY WITH BIOPSY N/A 06/17/2016   Procedure: CYSTOSCOPY WITH BIOPSY AND FULGERATION;  Surgeon: Alexis Frock, MD;  Location: [facility];  Service: Urology;  Laterality: N/A;  . LAMINECTOMY  2009   L3 - 4  . TRANSURETHRAL RESECTION OF BLADDER  x3 --  2009; 2010; 06-29-2010  . TRANSURETHRAL RESECTION OF BLADDER TUMOR  10/03/2011   Procedure: TRANSURETHRAL RESECTION OF BLADDER TUMOR (TURBT);  Surgeon: Molli Hazard, MD;  Location: [facility];  Service: Urology;  Laterality: N/A;    OB History    No data available       Home Medications    Prior to Admission medications   Medication Sig Start Date End Date Taking? Authorizing Provider  acetaminophen (TYLENOL) 500 MG tablet Take 500 mg by mouth every 6 (six) hours as needed for headache (pain).    Yes [provider]  amiodarone (PACERONE) 200 MG tablet Take 1 tablet (200  mg total) by mouth daily. 08/25/17  Yes Almyra Deforest, PA  apixaban (ELIQUIS) 5 MG TABS tablet Take 1 tablet (5 mg total) by mouth 2 (two) times daily. 07/26/17  Yes Sherran Needs, NP  furosemide (LASIX) 20 MG tablet Take 1 tablet (20 mg total) by mouth daily. 08/12/17  Yes Strader, [place] M, PA-C  levothyroxine (SYNTHROID, LEVOTHROID) 50 MCG tablet Take 50 mcg by mouth daily before breakfast.   Yes [provider]  metoprolol tartrate (LOPRESSOR) 25 MG tablet Take 1 tablet (25 mg total) by mouth 2 (two) times  daily. 08/11/17 11/09/17 Yes Strader, Fransisco Hertz, PA-C  polyvinyl alcohol (ARTIFICIAL TEARS) 1.4 % ophthalmic solution Place 1 drop into both eyes daily as needed for dry eyes (itching).   Yes [provider]  sodium chloride (OCEAN) 0.65 % SOLN nasal spray Place 1 spray into both nostrils daily as needed for congestion.   Yes [provider]  amLODipine (NORVASC) 2.5 MG tablet Take 1 tablet (2.5 mg total) by mouth daily. 08/23/17 11/21/17  Almyra Deforest, PA    Family History Family History  Problem Relation Age of Onset  . Breast cancer Sister   . Stomach cancer Sister   . Hernia Sister 67       HERNIA SURGERY COMPLICATIONS  . Colon cancer Neg Hx     Social History Social History   Tobacco Use  . Smoking status: Former Smoker    Years: 10.00    Types: Cigarettes    Last attempt to quit: 09/25/2005    Years since quitting: 11.9  . Smokeless tobacco: Never Used  Substance Use Topics  . Alcohol use: Yes  . Drug use: No     Allergies   Flagyl [metronidazole] and Lisinopril   Review of Systems Review of Systems  All other systems reviewed and are negative.    Physical Exam Updated Vital Signs BP (!) 158/80 (BP Location: Left Arm)   Pulse (!) 56   Temp 98 F (36.7 C) (Oral)   Resp 18   Ht 5\' 4"  (1.626 m)   Wt 77.1 kg (170 lb)   SpO2 90% Comment: Simultaneous filing. User may not have seen previous data.  BMI 29.18 kg/m   Physical Exam  Constitutional: She appears well-developed and well-nourished.  Non-toxic appearing  HENT:  Head: Normocephalic and atraumatic. Head is without raccoon's eyes and without Battle's sign.  Right Ear: Hearing, tympanic membrane, external ear and ear canal normal. Tympanic membrane is not perforated and not erythematous. No hemotympanum.  Left Ear: Hearing, tympanic membrane, external ear and ear canal normal. Tympanic membrane is not perforated and not erythematous. No hemotympanum.  Nose: Nose normal. No rhinorrhea. Right  sinus exhibits no maxillary sinus tenderness and no frontal sinus tenderness. Left sinus exhibits no maxillary sinus tenderness and no frontal sinus tenderness.  Mouth/Throat: Uvula is midline, oropharynx is clear and moist and mucous membranes are normal.  No CSF otorrhea   Eyes: Conjunctivae, EOM and lids are normal. Pupils are equal, round, and reactive to light. Right eye exhibits no discharge. Left eye exhibits no discharge. Right conjunctiva is not injected. Left conjunctiva is not injected. No scleral icterus. Right eye exhibits normal extraocular motion and no nystagmus. Left eye exhibits normal extraocular motion and no nystagmus.  Neck: Trachea normal, normal range of motion, full passive range of motion without pain and phonation normal. Neck supple. No spinous process tenderness and no muscular tenderness present. No neck rigidity. No tracheal deviation and normal  range of motion present.  Cardiovascular: Normal rate, regular rhythm, normal heart sounds and intact distal pulses.  Pulses:      Radial pulses are 2+ on the right side, and 2+ on the left side.       Dorsalis pedis pulses are 2+ on the right side, and 2+ on the left side.       Posterior tibial pulses are 2+ on the right side, and 2+ on the left side.  Pulmonary/Chest: Effort normal and breath sounds normal. No respiratory distress.  Musculoskeletal:       Left ankle: She exhibits decreased range of motion, swelling and ecchymosis. She exhibits no deformity and normal pulse. Tenderness. Medial malleolus tenderness found. Achilles tendon exhibits pain. Achilles tendon exhibits no defect and normal Thompson's test results.       Feet:  Sacral crepitus.  Negative logroll test bilaterally.  Able to move bilateral shoulders, elbows, wrists, knees without pain.  Neurological: She is alert. She has normal strength and normal reflexes. No cranial nerve deficit or sensory deficit. She displays a negative Romberg sign. Coordination and  gait normal.  Reflex Scores:      Bicep reflexes are 2+ on the right side and 2+ on the left side.      Patellar reflexes are 2+ on the right side and 2+ on the left side.      Achilles reflexes are 2+ on the right side and 2+ on the left side. Mental Status: Alert, oriented, thought content appropriate, able to give a coherent history. Speech fluent without evidence of aphasia. Able to follow 2 step commands without difficulty. Cranial Nerves: II: Peripheral visual fields grossly normal, pupils equal, round, reactive to light III,IV, VI: ptosis not present, extra-ocular motions intact bilaterally V,VII: smile symmetric, eyebrows raise symmetric, facial light touch sensation equal VIII: hearing grossly normal to voice X: uvula elevates symmetrically XI: bilateral shoulder shrug symmetric and strong XII: midline tongue extension without fassiculations Motor: Normal tone. 5/5 in upper and lower extremities bilaterally  Sensory: Sensation intact to light touch in all extremities. Deep Tendon Reflexes: 2+ and symmetric in the biceps and patella Cerebellar: normal finger-to-nose with bilateral upper extremities. Normal heel-to -shin balance bilaterally of the lower extremity. No pronator drift.  CV: distal pulses palpable throughout  Skin: She is not diaphoretic. No pallor.  Psychiatric: She has a normal mood and affect.  Nursing note and vitals reviewed.    ED Treatments / Results  Labs (all labs ordered are listed, but only abnormal results are displayed) Labs Reviewed  CBC WITH DIFFERENTIAL/PLATELET - Abnormal; Notable for the following components:      Result Value   WBC 11.2 (*)    Neutro Abs 9.1 (*)    All other components within normal limits  BASIC METABOLIC PANEL    EKG  EKG Interpretation None       Radiology Dg Ankle Complete Left  Result Date: 08/25/2017 CLINICAL DATA:  [place] today at home going to a bathroom; twisted left ankle; pain in lateral side  of ankle and radiates to left foot; hx fx about 30 years ago per pt EXAM: LEFT ANKLE COMPLETE - 3+ VIEW COMPARISON:  None. FINDINGS: There is an oblique fracture of the distal fibula extending from the lateral distal diaphysis to the medial metaphysis. The distal fracture component is displaced laterally by 6 mm. There are multiple small bone fragments between the medial malleolus and medial talus consistent with small avulsion fractures. The talus has subluxed laterally  by 6 mm. No other fractures. There is diffuse surrounding soft tissue swelling. IMPRESSION: 1. Mildly displaced distal fibular fracture. Multiple small avulsion fractures between the medial malleolus and talus, likely arising from the talus, consistent with avulsions at the deep deltoid ligament insertion. Talus is subluxed laterally by 6 mm. Electronically Signed   By: Lajean Manes M.D.   On: 08/25/2017 12:23   Ct Ankle Left Wo Contrast  Result Date: 08/25/2017 CLINICAL DATA:  Left ankle fracture. EXAM: CT OF THE LEFT ANKLE WITHOUT CONTRAST TECHNIQUE: Multidetector CT imaging of the left ankle was performed according to the standard protocol. Multiplanar CT image reconstructions were also generated. COMPARISON:  None. FINDINGS: Bones/Joint/Cartilage Oblique fracture of the distal fibular metaphysis with 6 mm of lateral displacement and 4 mm of posterior displacement. Sliver of bone along the lateral margin of the medial malleolus most consistent with an avulsion fracture with widening of the joint space of the medial malleolus-talus. Tiny fracture of the posterior malleolus of the tibia. Moderate joint space narrowing of the tibiotalar joint most consistent with osteoarthritis. No other fracture or dislocation. Generalized osteopenia. Subtalar joints are normal. Moderate osteoarthritis of the second and third tarsometatarsal joints. Mild osteoarthritis of the fourth tarsometatarsal joint. Small joint effusion. Ligaments Ligaments are  suboptimally evaluated by CT. Muscles and Tendons Muscles are normal. No muscle atrophy. Flexor, extensor, peroneal and Achilles tendons are grossly intact. Soft tissue No fluid collection or hematoma.  No soft tissue mass. IMPRESSION: 1. Oblique fracture of the distal fibular metaphysis with 6 mm of lateral displacement and 4 mm of posterior displacement. 2. Sliver of bone along the lateral margin of the medial malleolus most consistent with an avulsion fracture with widening of the joint space of the medial malleolus-talus. Tiny fracture of the posterior malleolus of the tibia. Moderate joint space narrowing of the tibiotalar joint most consistent with osteoarthritis. Electronically Signed   By: Kathreen Devoid   On: 08/25/2017 13:45   Dg Foot Complete Left  Result Date: 08/25/2017 CLINICAL DATA:  [place] today at home going to a bathroom; twisted left ankle; pain in lateral side of ankle and radiates to left foot; hx fx about 30 years ago per pt EXAM: LEFT FOOT - COMPLETE 3+ VIEW COMPARISON:  None. FINDINGS: The fracture of the distal fibula and avulsion fractures from the medial talus are again noted as is lateral talar subluxation. There are no foot fractures. There is a marked hallux valgus deformity. No dislocation. Degenerative spurring is noted at the cuneiform metatarsal articulation on the lateral view. Bones are demineralized. IMPRESSION: 1. No foot fracture or dislocation. Electronically Signed   By: Lajean Manes M.D.   On: 08/25/2017 12:24    Procedures Procedures (including critical care time)  Medications Ordered in ED Medications  acetaminophen (TYLENOL) tablet 500 mg (500 mg Oral Given 08/25/17 1316)     Initial Impression / Assessment and Plan / ED Course  I have reviewed the triage vital signs and the nursing notes.  Pertinent labs & imaging results that were available during my care of the patient were reviewed by me and considered in my medical decision making (see chart for  details).     83 year old female with a mechanical fall now complaining of left ankle pain.  On exam the patient has diffuse swelling to the left lower leg.  There is noted to be ecchymosis alongside the site of injury.  She has tenderness to the posterior heel and medial malleolus.  X-rays obtained to  evaluate this show mildly displaced distal fibular fracture, multiple small avulsion fractures between the medial malleolus and talus, likely arising from the talus, consistent with avulsions at the deep deltoid ligament insertion and the talus is subluxed laterally by 6 mm. Will obtain CT for further evaluation.   CT scan shows oblique fracture of the distal fibular metaphysis with 6 mm of lateral displacement and 4 mm of posterior displacement. There is also noted to be a sliver of bone along the lateral margin of the medial malleolus most consistent with an avulsion fracture with widening of the joint space of the medial malleolus-talus. There is also a tiny fracture of the posterior malleolus of the tibia.   I spoke with Dr. Sharol Given as he has seen the patient in the past.  He recommends that the patient will likely need reduction of the ankle or surgery.  States he is going out of town at this current time and is unable to see or evaluate the patient.  He recommended calling the on-call orthopedist so this can be done.  Spoke with Dr. Doreatha Martin who recommended the patient will need reduction and then splint. She will be non-weight bearing until possible surgery. Patient lives at home alone. Will require admission.   Reduction was attempted in the ED.  Dr. Doreatha Martin recommended splinting the fracture with portion pressed medially.  Discussed this with [facility].  Dr. Blaine Hamper to admit the patient. Family is in agreement with plan.   Patient case seen and discussed with Dr. Maryan Rued who is in agreement with plan.   Final Clinical Impressions(s) / ED Diagnoses   Final diagnoses:  Fall in home, initial encounter   Other closed fracture of distal end of left fibula, initial encounter  Closed avulsion fracture of medial malleolus of left tibia, initial encounter    ED Discharge Orders    None       Lorelle Gibbs 08/25/17 1547    Blanchie Dessert, MD 08/27/17 1527

## 2017-08-26 DIAGNOSIS — S82892D Other fracture of left lower leg, subsequent encounter for closed fracture with routine healing: Secondary | ICD-10-CM

## 2017-08-26 DIAGNOSIS — I5032 Chronic diastolic (congestive) heart failure: Secondary | ICD-10-CM

## 2017-08-26 LAB — BASIC METABOLIC PANEL
Anion gap: 6 (ref 5–15)
BUN: 17 mg/dL (ref 6–20)
CHLORIDE: 102 mmol/L (ref 101–111)
CO2: 28 mmol/L (ref 22–32)
CREATININE: 0.82 mg/dL (ref 0.44–1.00)
Calcium: 8.5 mg/dL — ABNORMAL LOW (ref 8.9–10.3)
GFR calc Af Amer: >60 mL/min (ref >60)
Glucose, Bld: 90 mg/dL (ref 65–99)
Potassium: 3.8 mmol/L (ref 3.5–5.1)
SODIUM: 136 mmol/L (ref 135–145)

## 2017-08-26 LAB — CBC
HCT: 38.5 % (ref 36.0–46.0)
Hemoglobin: 12.3 g/dL (ref 12.0–15.0)
MCH: 30.6 pg (ref 26.0–34.0)
MCHC: 31.9 g/dL (ref 30.0–36.0)
MCV: 95.8 fL (ref 78.0–100.0)
PLATELETS: 235 10*3/uL (ref 150–400)
RBC: 4.02 MIL/uL (ref 3.87–5.11)
RDW: 13.6 % (ref 11.5–15.5)
WBC: 8.7 10*3/uL (ref 4.0–10.5)

## 2017-08-26 LAB — ABO/RH: ABO/RH(D): O POS

## 2017-08-26 MED ORDER — HEPARIN (PORCINE) IN NACL 100-0.45 UNIT/ML-% IJ SOLN
850.0000 [IU]/h | INTRAMUSCULAR | Status: DC
  Administered 2017-08-26: 850 [IU]/h via INTRAVENOUS
  Filled 2017-08-26: qty 250

## 2017-08-26 NOTE — Progress Notes (Signed)
PROGRESS NOTE    Rebecca Norman  UXN:[id number] DOB: 1925-09-02 DOA: 08/25/2017 PCP: [name], MD    Brief Narrative:  Rebecca Norman is a 83 y.o. female with medical history significant of hypertension, hypothyroidism, bladder cancer (s/p of TURB), DDD, diverticulosis, atrial fibrillation on Eliquis, dCHF, who presents with fall and left ankle pain. CT of the left ankle showed oblique fracture of the distal fibular metaphysis with lateral and posterior displacement    Assessment & Plan:   Principal Problem:   Closed left ankle fracture Active Problems:   HTN (hypertension)   Hypothyroid   Atrial fibrillation, chronic (HCC)   Fall   Chronic diastolic CHF (congestive heart failure) (HCC)   Closed Left ankle fracture:  Orthopedics consulted and recommendations given.  Plan for surgery on Monday morning.  Npo after midnight on Sunday.  Pain control.    Hypertension:  Well controlled.    Chronic atrial fibrillation:  Rate well controlled. Patient on eliquis, changed to IV heparin.  Rate control with bb and amiodarone.    Hypothyroidism:  Resume synthroid.    Chronic diastolic heart failure:  She appears compensated.  Holding on lasix.      DVT prophylaxis: heparin gtt.  Code Status: (full code.  Family Communication: none at bedtime.  Disposition Plan: pending further evaluation by orthopedics. Possibly OR on Sunday.   Consultants:   Orthopedics.    Procedures: none.    Antimicrobials:none.    Subjective: Frustrated that surgery might not happen on Monday.   Objective: Vitals:   08/25/17 2256 08/26/17 0627 08/26/17 0654 08/26/17 1500  BP: (!) 153/64 (!) 102/32 (!) 110/42 109/67  Pulse: 69 62  (!) 57  Resp: 20 20  20   Temp: 97.7 F (36.5 C) 99 F (37.2 C)  98 F (36.7 C)  TempSrc: Oral Oral  Oral  SpO2: 96% 94%  94%  Weight: 76.6 kg (168 lb 14 oz)     Height: 5\' 4"  (1.626 m)       Intake/Output Summary (Last 24 hours) at  08/26/2017 1820 Last data filed at 08/26/2017 1500 Gross per 24 hour  Intake 360 ml  Output -  Net 360 ml   Filed Weights   08/25/17 1059 08/25/17 2256  Weight: 77.1 kg (170 lb) 76.6 kg (168 lb 14 oz)    Examination:  General exam: Appears calm and comfortable  Respiratory system: Clear to auscultation. Respiratory effort normal. Cardiovascular system: S1 & S2 heard, RRR. No JVD, murmurs, rubs, gallops or clicks. No pedal edema. Gastrointestinal system: Abdomen is nondistended, soft and nontender. No organomegaly or masses felt. Normal bowel sounds heard. Central nervous system: Alert and oriented. No focal neurological deficits. Extremities: LEFT ANKLE IN BANDAGES.  Skin: No rashes, lesions or ulcers Psychiatry: Judgement and insight appear normal. Mood & affect appropriate.     Data Reviewed: I have personally reviewed following labs and imaging studies  CBC: Recent Labs  Lab 08/25/17 1320 08/26/17 0626  WBC 11.2* 8.7  NEUTROABS 9.1*  --   HGB 14.1 12.3  HCT 41.4 38.5  MCV 94.1 95.8  PLT 229 254   Basic Metabolic Panel: Recent Labs  Lab 08/23/17 1118 08/25/17 1320 08/26/17 0626  NA 138 136 136  K 4.7 4.0 3.8  CL 99 102 102  CO2 24 26 28   GLUCOSE 94 94 90  BUN 17 18 17   CREATININE 0.87 0.71 0.82  CALCIUM 9.4 8.9 8.5*   GFR: Estimated Creatinine Clearance: 45.7 mL/min (by C-G  formula based on SCr of 0.82 mg/dL). Liver Function Tests: No results for input(s): AST, ALT, ALKPHOS, BILITOT, PROT, ALBUMIN in the last 168 hours. No results for input(s): LIPASE, AMYLASE in the last 168 hours. No results for input(s): AMMONIA in the last 168 hours. Coagulation Profile: Recent Labs  Lab 08/25/17 1912  INR 1.36   Cardiac Enzymes: No results for input(s): CKTOTAL, CKMB, CKMBINDEX, TROPONINI in the last 168 hours. BNP (last 3 results) No results for input(s): PROBNP in the last 8760 hours. HbA1C: No results for input(s): HGBA1C in the last 72 hours. CBG: No  results for input(s): GLUCAP in the last 168 hours. Lipid Profile: No results for input(s): CHOL, HDL, LDLCALC, TRIG, CHOLHDL, LDLDIRECT in the last 72 hours. Thyroid Function Tests: No results for input(s): TSH, T4TOTAL, FREET4, T3FREE, THYROIDAB in the last 72 hours. Anemia Panel: No results for input(s): VITAMINB12, FOLATE, FERRITIN, TIBC, IRON, RETICCTPCT in the last 72 hours. Sepsis Labs: No results for input(s): PROCALCITON, LATICACIDVEN in the last 168 hours.  No results found for this or any previous visit (from the past 240 hour(s)).       Radiology Studies: Dg Ankle Complete Left  Result Date: 08/25/2017 CLINICAL DATA:  83 year old female with distal fibular fracture. EXAM: LEFT ANKLE COMPLETE - 3+ VIEW COMPARISON:  CT dated 08/25/2017 FINDINGS: Mildly displaced oblique fracture of the distal fibula as seen on the prior CT. There is associated approximately 4 mm lateral subluxation of the ankle mortise. The bones are osteopenic. Degenerative changes of the tarsometatarsal joints. There has been interval placement of a cast. There is soft tissue swelling and edema. IMPRESSION: Mildly displaced fracture of the distal fibula and mild lateral subluxation of the ankle mortise status post cast placement. Electronically Signed   By: Anner Crete M.D.   On: 08/25/2017 20:36   Dg Ankle Complete Left  Result Date: 08/25/2017 CLINICAL DATA:  [place] today at home going to a bathroom; twisted left ankle; pain in lateral side of ankle and radiates to left foot; hx fx about 30 years ago per pt EXAM: LEFT ANKLE COMPLETE - 3+ VIEW COMPARISON:  None. FINDINGS: There is an oblique fracture of the distal fibula extending from the lateral distal diaphysis to the medial metaphysis. The distal fracture component is displaced laterally by 6 mm. There are multiple small bone fragments between the medial malleolus and medial talus consistent with small avulsion fractures. The talus has subluxed laterally  by 6 mm. No other fractures. There is diffuse surrounding soft tissue swelling. IMPRESSION: 1. Mildly displaced distal fibular fracture. Multiple small avulsion fractures between the medial malleolus and talus, likely arising from the talus, consistent with avulsions at the deep deltoid ligament insertion. Talus is subluxed laterally by 6 mm. Electronically Signed   By: Lajean Manes M.D.   On: 08/25/2017 12:23   Ct Head Wo Contrast  Result Date: 08/25/2017 CLINICAL DATA:  Fall with head trauma.  Ataxia. EXAM: CT HEAD WITHOUT CONTRAST CT CERVICAL SPINE WITHOUT CONTRAST TECHNIQUE: Multidetector CT imaging of the head and cervical spine was performed following the standard protocol without intravenous contrast. Multiplanar CT image reconstructions of the cervical spine were also generated. COMPARISON:  None. FINDINGS: CT HEAD FINDINGS Brain: No evidence of acute infarction, hemorrhage, hydrocephalus, extra-axial collection or mass lesion/mass effect. 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 Cortical/sulcal calcification left posterior frontal, nonspecific and incidental in isolation. Generalized volume loss. Vascular: Atherosclerotic calcification.  No hyperdense vessel. Skull: Negative for fracture Sinuses/Orbits: Bilateral cataract  resection. No evidence of injury. CT CERVICAL SPINE FINDINGS Alignment: No traumatic malalignment. Degenerative facet mediated C7-T1 mild anterolisthesis. Skull base and vertebrae: Negative for fracture. Soft tissues and spinal canal: No gross canal hematoma or prevertebral edema. Retropharyngeal course of the carotids incidentally noted. Asymmetric fatty atrophy of the left parotid. Disc levels: Facet ankylosis on the right from C2-C5. Left-sided facet ankylosis C2 -C4. Disc degeneration with advanced height loss and spurring at C5-6 and C6-7. Asymmetric left facet arthropathy at C7-T1 with anterolisthesis and foraminal narrowing. Upper chest: Pleural effusions and septal  lines consistent with edema. There is superimposed emphysematous markings. IMPRESSION: 1. Pulmonary edema and pleural effusions. 2. No evidence of acute intracranial or cervical spine injury. Electronically Signed   By: Monte Fantasia M.D.   On: 08/25/2017 16:32   Ct Cervical Spine Wo Contrast  Result Date: 08/25/2017 CLINICAL DATA:  Fall with head trauma.  Ataxia. EXAM: CT HEAD WITHOUT CONTRAST CT CERVICAL SPINE WITHOUT CONTRAST TECHNIQUE: Multidetector CT imaging of the head and cervical spine was performed following the standard protocol without intravenous contrast. Multiplanar CT image reconstructions of the cervical spine were also generated. COMPARISON:  None. FINDINGS: CT HEAD FINDINGS Brain: No evidence of acute infarction, hemorrhage, hydrocephalus, extra-axial collection or mass lesion/mass effect. 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 Cortical/sulcal calcification left posterior frontal, nonspecific and incidental in isolation. Generalized volume loss. Vascular: Atherosclerotic calcification.  No hyperdense vessel. Skull: Negative for fracture Sinuses/Orbits: Bilateral cataract resection. No evidence of injury. CT CERVICAL SPINE FINDINGS Alignment: No traumatic malalignment. Degenerative facet mediated C7-T1 mild anterolisthesis. Skull base and vertebrae: Negative for fracture. Soft tissues and spinal canal: No gross canal hematoma or prevertebral edema. Retropharyngeal course of the carotids incidentally noted. Asymmetric fatty atrophy of the left parotid. Disc levels: Facet ankylosis on the right from C2-C5. Left-sided facet ankylosis C2 -C4. Disc degeneration with advanced height loss and spurring at C5-6 and C6-7. Asymmetric left facet arthropathy at C7-T1 with anterolisthesis and foraminal narrowing. Upper chest: Pleural effusions and septal lines consistent with edema. There is superimposed emphysematous markings. IMPRESSION: 1. Pulmonary edema and pleural effusions. 2. No  evidence of acute intracranial or cervical spine injury. Electronically Signed   By: Monte Fantasia M.D.   On: 08/25/2017 16:32   Ct Ankle Left Wo Contrast  Result Date: 08/25/2017 CLINICAL DATA:  Left ankle fracture. EXAM: CT OF THE LEFT ANKLE WITHOUT CONTRAST TECHNIQUE: Multidetector CT imaging of the left ankle was performed according to the standard protocol. Multiplanar CT image reconstructions were also generated. COMPARISON:  None. FINDINGS: Bones/Joint/Cartilage Oblique fracture of the distal fibular metaphysis with 6 mm of lateral displacement and 4 mm of posterior displacement. Sliver of bone along the lateral margin of the medial malleolus most consistent with an avulsion fracture with widening of the joint space of the medial malleolus-talus. Tiny fracture of the posterior malleolus of the tibia. Moderate joint space narrowing of the tibiotalar joint most consistent with osteoarthritis. No other fracture or dislocation. Generalized osteopenia. Subtalar joints are normal. Moderate osteoarthritis of the second and third tarsometatarsal joints. Mild osteoarthritis of the fourth tarsometatarsal joint. Small joint effusion. Ligaments Ligaments are suboptimally evaluated by CT. Muscles and Tendons Muscles are normal. No muscle atrophy. Flexor, extensor, peroneal and Achilles tendons are grossly intact. Soft tissue No fluid collection or hematoma.  No soft tissue mass. IMPRESSION: 1. Oblique fracture of the distal fibular metaphysis with 6 mm of lateral displacement and 4 mm of posterior displacement. 2. Sliver of bone along  the lateral margin of the medial malleolus most consistent with an avulsion fracture with widening of the joint space of the medial malleolus-talus. Tiny fracture of the posterior malleolus of the tibia. Moderate joint space narrowing of the tibiotalar joint most consistent with osteoarthritis. Electronically Signed   By: Kathreen Devoid   On: 08/25/2017 13:45   Dg Foot Complete  Left  Result Date: 08/25/2017 CLINICAL DATA:  [place] today at home going to a bathroom; twisted left ankle; pain in lateral side of ankle and radiates to left foot; hx fx about 30 years ago per pt EXAM: LEFT FOOT - COMPLETE 3+ VIEW COMPARISON:  None. FINDINGS: The fracture of the distal fibula and avulsion fractures from the medial talus are again noted as is lateral talar subluxation. There are no foot fractures. There is a marked hallux valgus deformity. No dislocation. Degenerative spurring is noted at the cuneiform metatarsal articulation on the lateral view. Bones are demineralized. IMPRESSION: 1. No foot fracture or dislocation. Electronically Signed   By: Lajean Manes M.D.   On: 08/25/2017 12:24        Scheduled Meds: . amiodarone  200 mg Oral Daily  . amLODipine  2.5 mg Oral Daily  . furosemide  20 mg Oral Daily  . heparin  5,000 Units Subcutaneous Q8H  . levothyroxine  50 mcg Oral QAC breakfast  . metoprolol tartrate  25 mg Oral BID   Continuous Infusions:   LOS: 1 day    Time spent: 35 MINUTES.     Hosie Poisson, MD [facility] Pager [phone]   If 7PM-7AM, please contact night-coverage [email or web address] Password [email or web address] 08/26/2017, 6:20 PM

## 2017-08-26 NOTE — Consult Note (Signed)
Orthopaedic Trauma Service (OTS) Consult   Patient ID: [name] MRN: [id number] DOB/AGE: 03/29/1925 83 y.o.  Reason for Consult:Left ankle fracture Referring Physician: Dr. Maryan Norman of Rebecca Norman  HPI: Rebecca Norman is an 83 y.o. female who is being seen in consultation at the request of Dr. Maryan Norman for evaluation of left ankle fracture.  She has a history of A. fib on Eliquis along with diastolic heart failure combined with hypertension and hypothyroidism.  She is independently living by herself to use his walker and a cane intermittently.  She was transferring to get to her bedside commode the night before last where she tripped and fell and injured her ankle.  She had immediate pain and deformity.  She was brought to the emergency room the following morning where x-rays showed a lateral malleolus fracture with lateral subluxation of the talus.  A reduction was attempted by the emergency room and she was splinted.  She denies any other pain.  Her foot and ankle hurt when attempts at motion are done.  She has not attempted to get up yet today.  She has a daughter and a son who are local.  She was recently admitted at [facility] a few weeks ago.  Denies any other injuries.  Past Medical History:  Diagnosis Date  . DDD (degenerative disc disease), cervical    severe/  auto fusion c2-c5  . Diverticulosis   . Hemorrhoids   . History of adenomatous polyp of colon    1995  adenomatous polyp/  1997 & 2003  hyperplastic polyp's  . History of bladder cancer urologist-  dr [name]   s/p  TURBT's  . History of cancer of ureter   . Hypertension   . Hypothyroid   . Nocturia   . Osteoarthritis   . Osteopenia     Past Surgical History:  Procedure Laterality Date  . BACK SURGERY    . CARDIOVERSION N/A 08/09/2017   Procedure: CARDIOVERSION;  Surgeon: Larey Dresser, MD;  Location: [facility] ENDOSCOPY;  Service: Cardiovascular;  Laterality: N/A;  . CATARACT EXTRACTION W/ INTRAOCULAR  LENS  IMPLANT, BILATERAL Bilateral   . CYSTOSCOPY W/ RETROGRADES  10/03/2011   Procedure: CYSTOSCOPY WITH RETROGRADE PYELOGRAM;  Surgeon: Molli Hazard, MD;  Location: [facility];  Service: Urology;;  . Consuela Mimes W/ RETROGRADES Bilateral 06/17/2016   Procedure: CYSTOSCOPY WITH RETROGRADE PYELOGRAM;  Surgeon: Alexis Frock, MD;  Location: [facility];  Service: Urology;  Laterality: Bilateral;  . CYSTOSCOPY WITH BIOPSY N/A 06/17/2016   Procedure: CYSTOSCOPY WITH BIOPSY AND FULGERATION;  Surgeon: Alexis Frock, MD;  Location: [facility];  Service: Urology;  Laterality: N/A;  . LAMINECTOMY  2009   L3 - 4  . TRANSURETHRAL RESECTION OF BLADDER  x3 --  2009; 2010; 06-29-2010  . TRANSURETHRAL RESECTION OF BLADDER TUMOR  10/03/2011   Procedure: TRANSURETHRAL RESECTION OF BLADDER TUMOR (TURBT);  Surgeon: Molli Hazard, MD;  Location: [facility];  Service: Urology;  Laterality: N/A;    Family History  Problem Relation Age of Onset  . Breast cancer Sister   . Stomach cancer Sister   . Hernia Sister 39       HERNIA SURGERY COMPLICATIONS  . Colon cancer Neg Hx     Social History:  reports that she quit smoking about 11 years ago. Her smoking use included cigarettes. She quit after 10.00 years of use. she has never used smokeless tobacco. She reports that she  drinks alcohol. She reports that she does not use drugs.  Allergies:  Allergies  Allergen Reactions  . Flagyl [Metronidazole] Hives  . Lisinopril Cough    Medications:  No current facility-administered medications on file prior to encounter.    Current Outpatient Medications on File Prior to Encounter  Medication Sig Dispense Refill  . acetaminophen (TYLENOL) 500 MG tablet Take 500 mg by mouth every 6 (six) hours as needed for headache (pain).     Marland Kitchen amiodarone (PACERONE) 200 MG tablet Take 1 tablet (200 mg total) by mouth daily. 90 tablet 3  . apixaban  (ELIQUIS) 5 MG TABS tablet Take 1 tablet (5 mg total) by mouth 2 (two) times daily. 180 tablet 1  . furosemide (LASIX) 20 MG tablet Take 1 tablet (20 mg total) by mouth daily. 90 tablet 3  . levothyroxine (SYNTHROID, LEVOTHROID) 50 MCG tablet Take 50 mcg by mouth daily before breakfast.    . metoprolol tartrate (LOPRESSOR) 25 MG tablet Take 1 tablet (25 mg total) by mouth 2 (two) times daily. 180 tablet 3  . polyvinyl alcohol (ARTIFICIAL TEARS) 1.4 % ophthalmic solution Place 1 drop into both eyes daily as needed for dry eyes (itching).    . sodium chloride (OCEAN) 0.65 % SOLN nasal spray Place 1 spray into both nostrils daily as needed for congestion.    Marland Kitchen amLODipine (NORVASC) 2.5 MG tablet Take 1 tablet (2.5 mg total) by mouth daily. 180 tablet 3     ROS: Constitutional: No fever or chills Vision: No changes in vision ENT: No difficulty swallowing CV: No chest pain Pulm: No SOB or wheezing GI: No nausea or vomiting GU: No urgency or inability to hold urine Skin: No poor wound healing Neurologic: No numbness or tingling Psychiatric: No depression or anxiety well-appearing, no acute distress Heme: No bruising Allergic: No reaction to medications or food   Exam: Blood pressure (!) 110/42, pulse 62, temperature 99 F (37.2 C), temperature source Oral, resp. rate 20, height 5\' 4"  (1.626 m), weight 76.6 kg (168 lb 14 oz), SpO2 94 %. General: Well-appearing no acute distress Orientation: Awake alert and oriented x3 Mood and Affect: Cooperative and pleasant Gait: Not able to be assessed due to fracture Coordination and balance: Normal  Left lower extremity: Splint is clean dry and intact.  A window was made in the splint around her lateral malleolus which showed some bruising and swelling.  I did not take down the splint completely to assess skin wrinkling.  Intact EHL and FHL.  Sensation is intact to light touch to the foot.  She has a warm well-perfused toes with brisk cap refill.   Compartments are soft and compressible.  No pain at the knee or hip range of motion.  No notable instability anywhere else in her leg.  No lymphadenopathy and normal reflexes.  Right lower extremity: Skin without lesions. No tenderness to palpation. Full painless ROM, full strength in each muscle groups without evidence of instability.   Medical Decision Making: Imaging: X-rays of the ankle show a lateral malleolus fracture with lateral subluxation of the tibia.  CT scan was obtained by the ED provider which shows a small posterior malleolus and small medial malleolus fracture  Labs:  CBC    Component Value Date/Time   WBC 8.7 08/26/2017 0626   RBC 4.02 08/26/2017 0626   HGB 12.3 08/26/2017 0626   HCT 38.5 08/26/2017 0626   PLT 235 08/26/2017 0626   MCV 95.8 08/26/2017 0626   MCH 30.6  08/26/2017 0626   MCHC 31.9 08/26/2017 0626   RDW 13.6 08/26/2017 0626   LYMPHSABS 1.1 08/25/2017 1320   MONOABS 0.9 08/25/2017 1320   EOSABS 0.1 08/25/2017 1320   BASOSABS 0.1 08/25/2017 1320    Medical history and chart was reviewed  Assessment/Plan: 83 year old female with a history of diastolic heart failure and atrial fibrillation on Eliquis with a left trimalleolar ankle fracture dislocation  She is currently too swollen to proceed with definitive ORIF at this time.  I am hopeful that she will come down and her swelling to allow for surgery Monday morning which is 2 days from now.  I have tentatively posted for this date and will hold her Eliquis and start her on a heparin drip coordination with the hospitalist service.  I discussed risks and benefits with the patient. Risks discussed included bleeding requiring blood transfusion, bleeding causing a hematoma, infection, malunion, nonunion, damage to surrounding nerves and blood vessels, pain, hardware prominence or irritation, hardware failure, stiffness, post-traumatic arthritis, DVT/PE, compartment syndrome, and even death.  She agrees to  proceed.  She will likely be nonweightbearing for 4-6 weeks postoperatively depending on the stability of the injury.  Recommendations: -Nonweightbearing left lower extremity -Hold Eliquis placed on heparin drip with plan for surgery Monday morning -N.p.o. after midnight Sunday -PT OT for mobilization over this weekend. -We will reassess swelling Monday morning   Shona Needles, MD [facility] [phone] (phone)

## 2017-08-26 NOTE — H&P (View-Only) (Signed)
Orthopaedic Trauma Service (OTS) Consult   Patient ID: Rebecca Norman MRN: [id number] DOB/AGE: Feb 12, 1925 83 y.o.  Reason for Consult:Left ankle fracture Referring Physician: Dr. Maryan Rued of [facility]  HPI: Rebecca Norman is an 83 y.o. female who is being seen in consultation at the request of Dr. Maryan Rued for evaluation of left ankle fracture.  She has a history of A. fib on Eliquis along with diastolic heart failure combined with hypertension and hypothyroidism.  She is independently living by herself to use his walker and a cane intermittently.  She was transferring to get to her bedside commode the night before last where she tripped and fell and injured her ankle.  She had immediate pain and deformity.  She was brought to the emergency room the following morning where x-rays showed a lateral malleolus fracture with lateral subluxation of the talus.  A reduction was attempted by the emergency room and she was splinted.  She denies any other pain.  Her foot and ankle hurt when attempts at motion are done.  She has not attempted to get up yet today.  She has a daughter and a son who are local.  She was recently admitted at [facility] a few weeks ago.  Denies any other injuries.  Past Medical History:  Diagnosis Date  . DDD (degenerative disc disease), cervical    severe/  auto fusion c2-c5  . Diverticulosis   . Hemorrhoids   . History of adenomatous polyp of colon    1995  adenomatous polyp/  1997 & 2003  hyperplastic polyp's  . History of bladder cancer urologist-  dr [name]   s/p  TURBT's  . History of cancer of ureter   . Hypertension   . Hypothyroid   . Nocturia   . Osteoarthritis   . Osteopenia     Past Surgical History:  Procedure Laterality Date  . BACK SURGERY    . CARDIOVERSION N/A 08/09/2017   Procedure: CARDIOVERSION;  Surgeon: Larey Dresser, MD;  Location: [facility] ENDOSCOPY;  Service: Cardiovascular;  Laterality: N/A;  . CATARACT EXTRACTION W/ INTRAOCULAR  LENS  IMPLANT, BILATERAL Bilateral   . CYSTOSCOPY W/ RETROGRADES  10/03/2011   Procedure: CYSTOSCOPY WITH RETROGRADE PYELOGRAM;  Surgeon: Molli Hazard, MD;  Location: [facility];  Service: Urology;;  . Consuela Mimes W/ RETROGRADES Bilateral 06/17/2016   Procedure: CYSTOSCOPY WITH RETROGRADE PYELOGRAM;  Surgeon: Alexis Frock, MD;  Location: [facility];  Service: Urology;  Laterality: Bilateral;  . CYSTOSCOPY WITH BIOPSY N/A 06/17/2016   Procedure: CYSTOSCOPY WITH BIOPSY AND FULGERATION;  Surgeon: Alexis Frock, MD;  Location: [facility];  Service: Urology;  Laterality: N/A;  . LAMINECTOMY  2009   L3 - 4  . TRANSURETHRAL RESECTION OF BLADDER  x3 --  2009; 2010; 06-29-2010  . TRANSURETHRAL RESECTION OF BLADDER TUMOR  10/03/2011   Procedure: TRANSURETHRAL RESECTION OF BLADDER TUMOR (TURBT);  Surgeon: Molli Hazard, MD;  Location: [facility];  Service: Urology;  Laterality: N/A;    Family History  Problem Relation Age of Onset  . Breast cancer Sister   . Stomach cancer Sister   . Hernia Sister 80       HERNIA SURGERY COMPLICATIONS  . Colon cancer Neg Hx     Social History:  reports that she quit smoking about 11 years ago. Her smoking use included cigarettes. She quit after 10.00 years of use. she has never used smokeless tobacco. She reports that she  drinks alcohol. She reports that she does not use drugs.  Allergies:  Allergies  Allergen Reactions  . Flagyl [Metronidazole] Hives  . Lisinopril Cough    Medications:  No current facility-administered medications on file prior to encounter.    Current Outpatient Medications on File Prior to Encounter  Medication Sig Dispense Refill  . acetaminophen (TYLENOL) 500 MG tablet Take 500 mg by mouth every 6 (six) hours as needed for headache (pain).     Marland Kitchen amiodarone (PACERONE) 200 MG tablet Take 1 tablet (200 mg total) by mouth daily. 90 tablet 3  . apixaban  (ELIQUIS) 5 MG TABS tablet Take 1 tablet (5 mg total) by mouth 2 (two) times daily. 180 tablet 1  . furosemide (LASIX) 20 MG tablet Take 1 tablet (20 mg total) by mouth daily. 90 tablet 3  . levothyroxine (SYNTHROID, LEVOTHROID) 50 MCG tablet Take 50 mcg by mouth daily before breakfast.    . metoprolol tartrate (LOPRESSOR) 25 MG tablet Take 1 tablet (25 mg total) by mouth 2 (two) times daily. 180 tablet 3  . polyvinyl alcohol (ARTIFICIAL TEARS) 1.4 % ophthalmic solution Place 1 drop into both eyes daily as needed for dry eyes (itching).    . sodium chloride (OCEAN) 0.65 % SOLN nasal spray Place 1 spray into both nostrils daily as needed for congestion.    Marland Kitchen amLODipine (NORVASC) 2.5 MG tablet Take 1 tablet (2.5 mg total) by mouth daily. 180 tablet 3     ROS: Constitutional: No fever or chills Vision: No changes in vision ENT: No difficulty swallowing CV: No chest pain Pulm: No SOB or wheezing GI: No nausea or vomiting GU: No urgency or inability to hold urine Skin: No poor wound healing Neurologic: No numbness or tingling Psychiatric: No depression or anxiety well-appearing, no acute distress Heme: No bruising Allergic: No reaction to medications or food   Exam: Blood pressure (!) 110/42, pulse 62, temperature 99 F (37.2 C), temperature source Oral, resp. rate 20, height 5\' 4"  (1.626 m), weight 76.6 kg (168 lb 14 oz), SpO2 94 %. General: Well-appearing no acute distress Orientation: Awake alert and oriented x3 Mood and Affect: Cooperative and pleasant Gait: Not able to be assessed due to fracture Coordination and balance: Normal  Left lower extremity: Splint is clean dry and intact.  A window was made in the splint around her lateral malleolus which showed some bruising and swelling.  I did not take down the splint completely to assess skin wrinkling.  Intact EHL and FHL.  Sensation is intact to light touch to the foot.  She has a warm well-perfused toes with brisk cap refill.   Compartments are soft and compressible.  No pain at the knee or hip range of motion.  No notable instability anywhere else in her leg.  No lymphadenopathy and normal reflexes.  Right lower extremity: Skin without lesions. No tenderness to palpation. Full painless ROM, full strength in each muscle groups without evidence of instability.   Medical Decision Making: Imaging: X-rays of the ankle show a lateral malleolus fracture with lateral subluxation of the tibia.  CT scan was obtained by the ED provider which shows a small posterior malleolus and small medial malleolus fracture  Labs:  CBC    Component Value Date/Time   WBC 8.7 08/26/2017 0626   RBC 4.02 08/26/2017 0626   HGB 12.3 08/26/2017 0626   HCT 38.5 08/26/2017 0626   PLT 235 08/26/2017 0626   MCV 95.8 08/26/2017 0626   MCH 30.6  08/26/2017 0626   MCHC 31.9 08/26/2017 0626   RDW 13.6 08/26/2017 0626   LYMPHSABS 1.1 08/25/2017 1320   MONOABS 0.9 08/25/2017 1320   EOSABS 0.1 08/25/2017 1320   BASOSABS 0.1 08/25/2017 1320    Medical history and chart was reviewed  Assessment/Plan: 83 year old female with a history of diastolic heart failure and atrial fibrillation on Eliquis with a left trimalleolar ankle fracture dislocation  She is currently too swollen to proceed with definitive ORIF at this time.  I am hopeful that she will come down and her swelling to allow for surgery Monday morning which is 2 days from now.  I have tentatively posted for this date and will hold her Eliquis and start her on a heparin drip coordination with the hospitalist service.  I discussed risks and benefits with the patient. Risks discussed included bleeding requiring blood transfusion, bleeding causing a hematoma, infection, malunion, nonunion, damage to surrounding nerves and blood vessels, pain, hardware prominence or irritation, hardware failure, stiffness, post-traumatic arthritis, DVT/PE, compartment syndrome, and even death.  She agrees to  proceed.  She will likely be nonweightbearing for 4-6 weeks postoperatively depending on the stability of the injury.  Recommendations: -Nonweightbearing left lower extremity -Hold Eliquis placed on heparin drip with plan for surgery Monday morning -N.p.o. after midnight Sunday -PT OT for mobilization over this weekend. -We will reassess swelling Monday morning   Shona Needles, MD [facility] [phone] (phone)

## 2017-08-26 NOTE — Evaluation (Signed)
Occupational Therapy Evaluation Patient Details Name: Rebecca Norman MRN: [id number] DOB: 03/02/1925 Today's Date: 08/26/2017    History of Present Illness Rebecca Norman is a 83 y.o. female with medical history significant of hypertension, hypothyroidism, bladder cancer (s/p of TURB), DDD, divertisis, atrial fibrillation on Eliquis, dCHF, who presents with fall and left ankle fracture.  Pt is NWB on left with possible surgery on monday   Clinical Impression   Pt admitted with ankle fracture. Pt currently with functional limitations due to the deficits listed below (see OT Problem List).  Pt will benefit from skilled OT to increase their safety and independence with ADL and functional mobility for ADL to facilitate discharge to venue listed below.      Follow Up Recommendations  SNF    Equipment Recommendations  Other (comment)    Recommendations for Other Services       Precautions / Restrictions Precautions Precautions: Fall Precaution Comments: NWB LLE Restrictions Weight Bearing Restrictions: Yes LLE Weight Bearing: Non weight bearing      Mobility Bed Mobility Overal bed mobility: Needs Assistance Bed Mobility: Supine to Sit;Sit to Supine     Supine to sit: Mod assist Sit to supine: Mod assist   General bed mobility comments: A needed with left LLE  Transfers                 General transfer comment: did not perform    Balance Overall balance assessment: Needs assistance Sitting-balance support: Bilateral upper extremity supported Sitting balance-Leahy Scale: Good                                     ADL either performed or assessed with clinical judgement   ADL Overall ADL's : Needs assistance/impaired Eating/Feeding: Bed level;Set up   Grooming: Sitting;Minimal assistance                                 General ADL Comments: Pt sat EOB for appox 10 minutes.  Pt needed to return to supine at LLE was beginning to  hurt.  Discussed need for rehab prior to DC home as pt lives alone.     Vision Patient Visual Report: No change from baseline              Pertinent Vitals/Pain Pain Assessment: 0-10 Pain Score: 5  Pain Descriptors / Indicators: Discomfort;Sore Pain Intervention(s): Limited activity within patient's tolerance;Monitored during session;Repositioned;Patient requesting pain meds-RN notified;Relaxation     Hand Dominance     Extremity/Trunk Assessment Upper Extremity Assessment Upper Extremity Assessment: Generalized weakness              Cognition Arousal/Alertness: Awake/alert Behavior During Therapy: WFL for tasks assessed/performed Overall Cognitive Status: Within Functional Limits for tasks assessed                                                Home Living Family/patient expects to be discharged to:: Skilled nursing facility  OT Problem List: Decreased strength;Decreased activity tolerance;Decreased knowledge of use of DME or AE;Pain;Decreased knowledge of precautions;Decreased safety awareness;Impaired balance (sitting and/or standing)      OT Treatment/Interventions: Self-care/ADL training;Patient/family education;DME and/or AE instruction    OT Goals(Current goals can be found in the care plan section) Acute Rehab OT Goals Patient Stated Goal: get well OT Goal Formulation: With patient Time For Goal Achievement: 09/09/17  OT Frequency: Min 2X/week    AM-PAC PT "6 Clicks" Daily Activity     Outcome Measure Help from another person eating meals?: A Little Help from another person taking care of personal grooming?: A Little Help from another person toileting, which includes using toliet, bedpan, or urinal?: Total Help from another person bathing (including washing, rinsing, drying)?: A Lot Help from another person to put on and taking off regular upper body clothing?:  Total Help from another person to put on and taking off regular lower body clothing?: Total 6 Click Score: 11   End of Session Nurse Communication: Mobility status;Weight bearing status  Activity Tolerance: Patient limited by pain Patient left: in bed;with call bell/phone within reach;with bed alarm set;with nursing/sitter in room  OT Visit Diagnosis: Unsteadiness on feet (R26.81);History of falling (Z91.81);Other abnormalities of gait and mobility (R26.89)                Time: 9233-0076 OT Time Calculation (min): 16 min Charges:  OT General Charges $OT Visit: 1 Visit OT Evaluation $OT Eval Low Complexity: 1 Low G-Codes:     Kari Baars, [place] [phone]  Payton Mccallum D 08/26/2017, 3:40 PM

## 2017-08-26 NOTE — Progress Notes (Signed)
ANTICOAGULATION CONSULT NOTE - Initial Consult  Pharmacy Consult for IV heparin Indication: atrial fibrillation  Allergies  Allergen Reactions  . Flagyl [Metronidazole] Hives  . Lisinopril Cough    Patient Measurements: Height: 5\' 4"  (162.6 cm) Weight: 168 lb 14 oz (76.6 kg) IBW/kg (Calculated) : 54.7 Heparin Dosing Weight: 70.8  Vital Signs: Temp: 98 F (36.7 C) (12/22 1500) Temp Source: Oral (12/22 1500) BP: 109/67 (12/22 1500) Pulse Rate: 57 (12/22 1500)  Labs: Recent Labs    08/25/17 1320 08/25/17 1912 08/26/17 0626  HGB 14.1  --  12.3  HCT 41.4  --  38.5  PLT 229  --  235  APTT  --  35  --   LABPROT  --  16.6*  --   INR  --  1.36  --   CREATININE 0.71  --  0.82    Estimated Creatinine Clearance: 45.7 mL/min (by C-G formula based on SCr of 0.82 mg/dL).   Medical History: Past Medical History:  Diagnosis Date  . DDD (degenerative disc disease), cervical    severe/  auto fusion c2-c5  . Diverticulosis   . Hemorrhoids   . History of adenomatous polyp of colon    1995  adenomatous polyp/  1997 & 2003  hyperplastic polyp's  . History of bladder cancer urologist-  dr [name]   s/p  TURBT's  . History of cancer of ureter   . Hypertension   . Hypothyroid   . Nocturia   . Osteoarthritis   . Osteopenia     Medications:  Scheduled:  . amiodarone  200 mg Oral Daily  . amLODipine  2.5 mg Oral Daily  . furosemide  20 mg Oral Daily  . levothyroxine  50 mcg Oral QAC breakfast  . metoprolol tartrate  25 mg Oral BID   Infusions:    Assessment: 83 yo female presented with fractured ankle on Eliquis PTA for Afib. Last dose of apixaban was 12/21 at 0830 per med history. Patient has been on SQ UFH 5000 units q8 since admission for DVT ppx with last dose around 1400 today. To transition to IV heparin until planned ankle surgery on Monday 12/24. Baseline labs good. APTT 35 this AM.   Goal of Therapy:  Heparin level 0.3-0.7 units/ml Monitor platelets by  anticoagulation protocol: Yes   Plan:  1) No IV heparin bolus 2) IV heparin infusion rate of 850 units/hr 3) Check heparin level and aPTT 8 hours after start of IV heparin 4) Daily CBC  . Adrian Saran, PharmD, BCPS Pager [phone] 08/26/2017 6:28 PM

## 2017-08-26 NOTE — Progress Notes (Signed)
Orthopedic Tech Progress Note Patient Details:  Rebecca Norman [birth year] [id number] Pt cannot use OHF due to age and immobility.  Patient ID: Rebecca Norman, female   DOB: Jan 17, 1925, 83 y.o.   MRN: [id number]   Ladell Pier [facility] 08/26/2017, 11:26 AM

## 2017-08-27 LAB — CBC
HCT: 37.7 % (ref 36.0–46.0)
Hemoglobin: 12.3 g/dL (ref 12.0–15.0)
MCH: 31.4 pg (ref 26.0–34.0)
MCHC: 32.6 g/dL (ref 30.0–36.0)
MCV: 96.2 fL (ref 78.0–100.0)
PLATELETS: 232 10*3/uL (ref 150–400)
RBC: 3.92 MIL/uL (ref 3.87–5.11)
RDW: 13.9 % (ref 11.5–15.5)
WBC: 10 10*3/uL (ref 4.0–10.5)

## 2017-08-27 LAB — SURGICAL PCR SCREEN
MRSA, PCR: NEGATIVE
Staphylococcus aureus: NEGATIVE

## 2017-08-27 LAB — APTT: APTT: 130 s — AB (ref 24–36)

## 2017-08-27 LAB — HEPARIN LEVEL (UNFRACTIONATED)
HEPARIN UNFRACTIONATED: 1.42 [IU]/mL — AB (ref 0.30–0.70)
Heparin Unfractionated: 0.87 IU/mL — ABNORMAL HIGH (ref 0.30–0.70)

## 2017-08-27 MED ORDER — HEPARIN (PORCINE) IN NACL 100-0.45 UNIT/ML-% IJ SOLN
500.0000 [IU]/h | INTRAMUSCULAR | Status: DC
  Administered 2017-08-27: 500 [IU]/h via INTRAVENOUS
  Filled 2017-08-27: qty 250

## 2017-08-27 MED ORDER — ENSURE ENLIVE PO LIQD: 237.0000 mL | Freq: Two times a day (BID) | ORAL | Status: DC

## 2017-08-27 MED ORDER — HEPARIN (PORCINE) IN NACL 100-0.45 UNIT/ML-% IJ SOLN
400.0000 [IU]/h | INTRAMUSCULAR | Status: AC
  Filled 2017-08-27: qty 250

## 2017-08-27 NOTE — Progress Notes (Signed)
Occupational Therapy Treatment Patient Details Name: Rebecca Norman MRN: [id number] DOB: 03/08/1925 Today's Date: 08/27/2017    History of present illness Rebecca Norman is a 83 y.o. female with medical history significant of hypertension, hypothyroidism, bladder cancer (s/p of TURB), DDD, divertisis, atrial fibrillation on Eliquis, dCHF, who presents with fall and left ankle fracture.  Pt is NWB on left with possible surgery on monday      Follow Up Recommendations  SNF    Equipment Recommendations  Other (comment)    Recommendations for Other Services      Precautions / Restrictions Precautions Precautions: Fall Precaution Comments: NWB LLE Restrictions Weight Bearing Restrictions: Yes LLE Weight Bearing: Non weight bearing       Mobility Bed Mobility Overal bed mobility: Needs Assistance Bed Mobility: Supine to Sit;Sit to Supine     Supine to sit: Mod assist Sit to supine: Mod assist   General bed mobility comments: A needed with left LLE  Transfers Overall transfer level: Needs assistance Equipment used: Rolling walker (2 wheeled) Transfers: Sit to/from Stand Sit to Stand: Mod assist;+2 safety/equipment;+2 physical assistance;From elevated surface         General transfer comment: stood x 30 seconds for bed pads to be changed while therapist holding the left foot off floor.     Balance Overall balance assessment: Needs assistance Sitting-balance support: Bilateral upper extremity supported Sitting balance-Leahy Scale: Good     Standing balance support: During functional activity;Bilateral upper extremity supported Standing balance-Leahy Scale: Poor                             ADL either performed or assessed with clinical judgement   ADL Overall ADL's : Needs assistance/impaired Eating/Feeding: Bed level;Set up   Grooming: Sitting;Minimal assistance;Set up                       [place]ickson and  Hygiene: Sit to/from stand;+2 for physical assistance;+2 for safety/equipment;Cueing for safety;Cueing for sequencing;Total assistance                         Cognition Arousal/Alertness: Awake/alert Behavior During Therapy: WFL for tasks assessed/performed Overall Cognitive Status: Within Functional Limits for tasks assessed                                                     Pertinent Vitals/ Pain       Pain Assessment: Faces Faces Pain Scale: Hurts whole lot Pain Location: left ankle  Pain Descriptors / Indicators: Discomfort;Sore Pain Intervention(s): Monitored during session;Limited activity within patient's tolerance;Repositioned  Home Living Family/patient expects to be discharged to:: Skilled nursing facility Living Arrangements: Alone                                      Prior Functioning/Environment              Frequency  Min 2X/week        Progress Toward Goals  OT Goals(current goals can now be found in the care plan section)  Progress towards OT goals: Progressing toward goals  Acute Rehab OT Goals Patient Stated Goal:  get back home  Plan      Co-evaluation                 AM-PAC PT "6 Clicks" Daily Activity     Outcome Measure   Help from another person eating meals?: A Little Help from another person taking care of personal grooming?: A Little Help from another person toileting, which includes using toliet, bedpan, or urinal?: Total Help from another person bathing (including washing, rinsing, drying)?: A Lot Help from another person to put on and taking off regular upper body clothing?: Total Help from another person to put on and taking off regular lower body clothing?: Total 6 Click Score: 11    End of Session Equipment Utilized During Treatment: Rolling walker  OT Visit Diagnosis: Unsteadiness on feet (R26.81);History of falling (Z91.81);Other abnormalities of gait and mobility  (R26.89)   Activity Tolerance Patient limited by pain   Patient Left in bed;with call bell/phone within reach;with bed alarm set;with nursing/sitter in room   Nurse Communication Mobility status;Weight bearing status        Time: 7366-8159 OT Time Calculation (min): 46 min  Charges: OT General Charges $OT Visit: 1 Visit  Kari Baars, [place]   Betsy Pries 08/27/2017, 11:36 AM

## 2017-08-27 NOTE — Evaluation (Signed)
Physical Therapy Evaluation Patient Details Name: Rebecca Norman MRN: [id number] DOB: [birth year] Today's Date: 08/27/2017   History of Present Illness  Rebecca Norman is a 83 y.o. female with medical history significant of hypertension, hypothyroidism, bladder cancer ), DDD, divertisis, atrial fibrillation on Eliquis, dCHF, who presents with fall and left ankle fracture.  Pt is NWB on left with possible surgery on monday  Clinical Impression  The patient is very motivated to mobilize. Noted scheduled for surgery, tentatively, 08/28/17. Pt admitted with above diagnosis. Pt currently with functional limitations due to the deficits listed below (see PT Problem List).  Pt will benefit from skilled PT to increase their independence and safety with mobility to allow discharge to the venue listed below.        Follow Up Recommendations SNF    Equipment Recommendations  None recommended by PT    Recommendations for Other Services       Precautions / Restrictions Precautions Precautions: Fall Restrictions LLE Weight Bearing: Non weight bearing      Mobility  Bed Mobility Overal bed mobility: Needs Assistance Bed Mobility: Supine to Sit;Sit to Supine     Supine to sit: Mod assist Sit to supine: Mod assist   General bed mobility comments: A needed with left LLE  Transfers Overall transfer level: Needs assistance Equipment used: Rolling walker (2 wheeled) Transfers: Sit to/from Stand Sit to Stand: Mod assist;+2 safety/equipment;+2 physical assistance;From elevated surface         General transfer comment: stood x 30 seconds for bed pads to be changed while therapist holding the left foot off floor.   Ambulation/Gait                Stairs            Wheelchair Mobility    Modified Rankin (Stroke Patients Only)       Balance Overall balance assessment: Needs assistance Sitting-balance support: Bilateral upper extremity supported Sitting balance-Leahy  Scale: Good     Standing balance support: During functional activity;Bilateral upper extremity supported Standing balance-Leahy Scale: Poor                               Pertinent Vitals/Pain Pain Assessment: Faces Faces Pain Scale: Hurts whole lot Pain Location: left ankle dependent Pain Descriptors / Indicators: Discomfort;Sore Pain Intervention(s): Monitored during session;Limited activity within patient's tolerance;Repositioned    Home Living Family/patient expects to be discharged to:: Skilled nursing facility Living Arrangements: Alone                    Prior Function                 Hand Dominance        Extremity/Trunk Assessment   Upper Extremity Assessment Upper Extremity Assessment: Defer to OT evaluation    Lower Extremity Assessment Lower Extremity Assessment: LLE deficits/detail LLE Deficits / Details: SLC in place       Communication   Communication: No difficulties  Cognition Arousal/Alertness: Awake/alert Behavior During Therapy: WFL for tasks assessed/performed                                          General Comments      Exercises     Assessment/Plan    PT Assessment Patient needs continued PT services  PT  Problem List Pain       PT Treatment Interventions DME instruction;Functional mobility training;Therapeutic activities;Therapeutic exercise;Patient/family education    PT Goals (Current goals can be found in the Care Plan section)  Acute Rehab PT Goals Patient Stated Goal:  get back home PT Goal Formulation: With patient Time For Goal Achievement: 09/10/17 Potential to Achieve Goals: Good    Frequency Min 2X/week   Barriers to discharge Decreased caregiver support      Co-evaluation               AM-PAC PT "6 Clicks" Daily Activity  Outcome Measure Difficulty turning over in bed (including adjusting bedclothes, sheets and blankets)?: Unable Difficulty moving from lying  on back to sitting on the side of the bed? : Unable Difficulty sitting down on and standing up from a chair with arms (e.g., wheelchair, bedside commode, etc,.)?: Unable Help needed moving to and from a bed to chair (including a wheelchair)?: Total Help needed walking in hospital room?: Total Help needed climbing 3-5 steps with a railing? : Total 6 Click Score: 6    End of Session   Activity Tolerance: Patient limited by fatigue;Patient limited by pain Patient left: in bed;with call bell/phone within reach;with nursing/sitter in room Nurse Communication: Mobility status PT Visit Diagnosis: Difficulty in walking, not elsewhere classified (R26.2)    Time: 1916-6060 PT Time Calculation (min) (ACUTE ONLY): 26 min   Charges:   PT Evaluation $PT Eval Low Complexity: 1 Low     PT G CodesTresa Norman PT [phone]   Rebecca Norman 08/27/2017, 11:27 AM

## 2017-08-27 NOTE — Progress Notes (Signed)
PROGRESS NOTE    Rebecca Norman  BTD:[id number] DOB: 11/29/1924 DOA: 08/25/2017 PCP: [name], MD    Brief Narrative:  Rebecca Norman is a 83 y.o. female with medical history significant of hypertension, hypothyroidism, bladder cancer (s/p of TURB), DDD, diverticulosis, atrial fibrillation on Eliquis, dCHF, who presents with fall and left ankle pain. CT of the left ankle showed oblique fracture of the distal fibular metaphysis with lateral and posterior displacement    Assessment & Plan:   Principal Problem:   Closed left ankle fracture Active Problems:   HTN (hypertension)   Hypothyroid   Atrial fibrillation, chronic (HCC)   Fall   Chronic diastolic CHF (congestive heart failure) (HCC)   Closed Left ankle fracture:  Orthopedics consulted and recommendations given.  Plan for surgery on Monday morning.  Npo after midnight on Sunday.  Pain control.  Swollen left leg.    Hypertension:  Well controlled.    Chronic atrial fibrillation:  Rate well controlled. Patient on eliquis, changed to IV heparin.  Rate control with bb and amiodarone.    Hypothyroidism:  Resume synthroid.    Chronic diastolic heart failure:  She appears compensated.  Holding on lasix.      DVT prophylaxis: heparin gtt.  Code Status: (full code.  Family Communication: none at bedtime.  Disposition Plan: pending further evaluation by orthopedics. Possibly OR on Sunday.   Consultants:   Orthopedics.    Procedures: none.    Antimicrobials:none.    Subjective: Pain not well controlled.   Objective: Vitals:   08/27/17 0019 08/27/17 0700 08/27/17 0920 08/27/17 1329  BP: (!) 127/47 123/60  (!) 123/47  Pulse: (!) 55  63 63  Resp: 18 18  17   Temp: 98.5 F (36.9 C) (!) 97.5 F (36.4 C)  99.1 F (37.3 C)  TempSrc: Oral Axillary  Axillary  SpO2: 97% 94%  93%  Weight:      Height:        Intake/Output Summary (Last 24 hours) at 08/27/2017 1727 Last data filed at  08/27/2017 0847 Gross per 24 hour  Intake 463.13 ml  Output 700 ml  Net -236.87 ml   Filed Weights   08/25/17 1059 08/25/17 2256  Weight: 77.1 kg (170 lb) 76.6 kg (168 lb 14 oz)    Examination:  General exam: Appears in mild distress from pain.  Respiratory system: Clear to auscultation. Respiratory effort normal. No wheezing or rhonchi.  Cardiovascular system: S1 & S2 heard, RRR. No JVD, murmurs. No pedal edema. Gastrointestinal system: Abdomen is soft NT ND BS+ Central nervous system: Alert and oriented. No focal neurological deficits. Extremities: LEFT ANKLE IN BANDAGES.  Skin: No rashes, lesions or ulcers Psychiatry: . Mood & affect appropriate.     Data Reviewed: I have personally reviewed following labs and imaging studies  CBC: Recent Labs  Lab 08/25/17 1320 08/26/17 0626  WBC 11.2* 8.7  NEUTROABS 9.1*  --   HGB 14.1 12.3  HCT 41.4 38.5  MCV 94.1 95.8  PLT 229 106   Basic Metabolic Panel: Recent Labs  Lab 08/23/17 1118 08/25/17 1320 08/26/17 0626  NA 138 136 136  K 4.7 4.0 3.8  CL 99 102 102  CO2 24 26 28   GLUCOSE 94 94 90  BUN 17 18 17   CREATININE 0.87 0.71 0.82  CALCIUM 9.4 8.9 8.5*   GFR: Estimated Creatinine Clearance: 45.7 mL/min (by C-G formula based on SCr of 0.82 mg/dL). Liver Function Tests: No results for input(s): AST, ALT, ALKPHOS,  BILITOT, PROT, ALBUMIN in the last 168 hours. No results for input(s): LIPASE, AMYLASE in the last 168 hours. No results for input(s): AMMONIA in the last 168 hours. Coagulation Profile: Recent Labs  Lab 08/25/17 1912  INR 1.36   Cardiac Enzymes: No results for input(s): CKTOTAL, CKMB, CKMBINDEX, TROPONINI in the last 168 hours. BNP (last 3 results) No results for input(s): PROBNP in the last 8760 hours. HbA1C: No results for input(s): HGBA1C in the last 72 hours. CBG: No results for input(s): GLUCAP in the last 168 hours. Lipid Profile: No results for input(s): CHOL, HDL, LDLCALC, TRIG, CHOLHDL,  LDLDIRECT in the last 72 hours. Thyroid Function Tests: No results for input(s): TSH, T4TOTAL, FREET4, T3FREE, THYROIDAB in the last 72 hours. Anemia Panel: No results for input(s): VITAMINB12, FOLATE, FERRITIN, TIBC, IRON, RETICCTPCT in the last 72 hours. Sepsis Labs: No results for input(s): PROCALCITON, LATICACIDVEN in the last 168 hours.  No results found for this or any previous visit (from the past 240 hour(s)).       Radiology Studies: Dg Ankle Complete Left  Result Date: 08/25/2017 CLINICAL DATA:  83 year old female with distal fibular fracture. EXAM: LEFT ANKLE COMPLETE - 3+ VIEW COMPARISON:  CT dated 08/25/2017 FINDINGS: Mildly displaced oblique fracture of the distal fibula as seen on the prior CT. There is associated approximately 4 mm lateral subluxation of the ankle mortise. The bones are osteopenic. Degenerative changes of the tarsometatarsal joints. There has been interval placement of a cast. There is soft tissue swelling and edema. IMPRESSION: Mildly displaced fracture of the distal fibula and mild lateral subluxation of the ankle mortise status post cast placement. Electronically Signed   By: Anner Crete M.D.   On: 08/25/2017 20:36        Scheduled Meds: . amiodarone  200 mg Oral Daily  . amLODipine  2.5 mg Oral Daily  . feeding supplement (ENSURE ENLIVE)  237 mL Oral BID BM  . levothyroxine  50 mcg Oral QAC breakfast  . metoprolol tartrate  25 mg Oral BID   Continuous Infusions: . heparin 500 Units/hr (08/27/17 1001)     LOS: 2 days    Time spent: 35 MINUTES.     Hosie Poisson, MD [facility] Pager [phone]   If 7PM-7AM, please contact night-coverage [email or web address] Password [facility] 08/27/2017, 5:27 PM

## 2017-08-27 NOTE — Progress Notes (Signed)
ANTICOAGULATION CONSULT NOTE - Follow Up  Pharmacy Consult for IV heparin Indication: atrial fibrillation  Allergies  Allergen Reactions  . Flagyl [Metronidazole] Hives  . Lisinopril Cough    Patient Measurements: Height: 5\' 4"  (162.6 cm) Weight: 168 lb 14 oz (76.6 kg) IBW/kg (Calculated) : 54.7 Heparin Dosing Weight: 70.8  Vital Signs: Temp: 99.1 F (37.3 C) (12/23 1329) Temp Source: Axillary (12/23 1329) BP: 123/47 (12/23 1329) Pulse Rate: 63 (12/23 1329)  Labs: Recent Labs    08/25/17 1320 08/25/17 1912 08/26/17 0626 08/27/17 0626 08/27/17 1908 08/27/17 1909  HGB 14.1  --  12.3  --   --  12.3  HCT 41.4  --  38.5  --   --  37.7  PLT 229  --  235  --   --  232  APTT  --  35  --  130*  --   --   LABPROT  --  16.6*  --   --   --   --   INR  --  1.36  --   --   --   --   HEPARINUNFRC  --   --   --  1.42* 0.87*  --   CREATININE 0.71  --  0.82  --   --   --     Estimated Creatinine Clearance: 45.7 mL/min (by C-G formula based on SCr of 0.82 mg/dL).   Medical History: Past Medical History:  Diagnosis Date  . DDD (degenerative disc disease), cervical    severe/  auto fusion c2-c5  . Diverticulosis   . Hemorrhoids   . History of adenomatous polyp of colon    1995  adenomatous polyp/  1997 & 2003  hyperplastic polyp's  . History of bladder cancer urologist-  dr [name]   s/p  TURBT's  . History of cancer of ureter   . Hypertension   . Hypothyroid   . Nocturia   . Osteoarthritis   . Osteopenia     Medications:  Scheduled:  . amiodarone  200 mg Oral Daily  . amLODipine  2.5 mg Oral Daily  . feeding supplement (ENSURE ENLIVE)  237 mL Oral BID BM  . levothyroxine  50 mcg Oral QAC breakfast  . metoprolol tartrate  25 mg Oral BID   Infusions:  . heparin 500 Units/hr (08/27/17 1001)    Assessment: 83 yo female presented with fractured ankle on Eliquis PTA for Afib. Last dose of apixaban was 12/21 at 0830 per med history. Patient has been on SQ UFH 5000  units q8 since admission for DVT ppx with last dose around 1400 today. To transition to IV heparin until planned ankle surgery on Monday 12/24. Baseline labs good. APTT 35 this AM.   Today, 08/27/17  Heparin level better but still supratherapeutic on current rate of 500 units/hr  CBC stable  No reported bleeding per RN  Goal of Therapy:  Heparin level 0.3-0.7 units/ml Monitor platelets by anticoagulation protocol: Yes   Plan:  1) Decrease IV heparin rate from 500 units/hr to 400 units/hr Noted plan for surgery tomorrow - will await orders from ortho as to when to stop IV heparin - Looks surgery is scheduled for 0800 12/24 so will stop heparin at 0400.  Romeo Rabon, PharmD. [place]: [phone]. 08/27/2017,8:09 PM.

## 2017-08-27 NOTE — Anesthesia Preprocedure Evaluation (Addendum)
Anesthesia Evaluation  Patient identified by MRN, date of birth, ID band Patient awake    Reviewed: Allergy & Precautions, NPO status , Patient's Chart, lab work & pertinent test results, reviewed documented beta blocker date and time   History of Anesthesia Complications Negative for: history of anesthetic complications  Airway Mallampati: II   Neck ROM: Full    Dental  (+) Edentulous Upper, Edentulous Lower   Pulmonary former smoker,    breath sounds clear to auscultation       Cardiovascular hypertension, Pt. on home beta blockers and Pt. on medications + dysrhythmias Atrial Fibrillation + Valvular Problems/Murmurs  Rhythm:Regular Rate:Bradycardia  TTE 2018 - Mild LVH. EF was in the range of 60% to 65%.   Trivial AI, Left atrium was moderately to severely dilated   Neuro/Psych negative neurological ROS  negative psych ROS   GI/Hepatic negative GI ROS, Neg liver ROS,   Endo/Other  Hypothyroidism   Renal/GU negative Renal ROS  negative genitourinary   Musculoskeletal  (+) Arthritis ,   Abdominal   Peds  Hematology negative hematology ROS (+)   Anesthesia Other Findings   Reproductive/Obstetrics                            Anesthesia Physical  Anesthesia Plan  ASA: III  Anesthesia Plan: General   Post-op Pain Management:  Regional for Post-op pain   Induction: Intravenous  PONV Risk Score and Plan: 3 and Treatment may vary due to age or medical condition, Ondansetron and Propofol infusion  Airway Management Planned: LMA  Additional Equipment: None  Intra-op Plan:   Post-operative Plan: Extubation in OR  Informed Consent: I have reviewed the patients History and Physical, chart, labs and discussed the procedure including the risks, benefits and alternatives for the proposed anesthesia with the patient or authorized representative who has indicated his/her understanding and  acceptance.     Plan Discussed with: CRNA  Anesthesia Plan Comments:         Anesthesia Quick Evaluation

## 2017-08-27 NOTE — Progress Notes (Signed)
Initial Nutrition Assessment  INTERVENTION:   -Provide Ensure Enlive po BID, each supplement provides 350 kcal and 20 grams of protein -RD will continue to monitor for needs  NUTRITION DIAGNOSIS:   Increased nutrient needs related to post-op healing as evidenced by estimated needs.  GOAL:   Patient will meet greater than or equal to 90% of their needs  MONITOR:   PO intake, Supplement acceptance, Weight trends, Labs, I & O's  REASON FOR ASSESSMENT:   Consult Hip fracture protocol  ASSESSMENT:    83 y.o. female with medical history significant of hypertension, hypothyroidism, bladder cancer (s/p of TURB), DDD, diverticulosis, atrial fibrillation on Eliquis, dCHF, who presents with fall and left ankle pain.  Pt currently consuming 50-95% of meals. Pt will be NPO on 12/24 for pending surgery on ankle fracture. Pt eating well. RD will order Ensure supplements to provide additional kcal and protein given anticipated increased needs following surgery.  Per chart review, pt has lost 9 lb since 11/27 (5% wt loss x 1 month, significant for time frame). However pt with history of CHF, may be fluid related.  Labs reviewed. Medications reviewed.  NUTRITION - FOCUSED PHYSICAL EXAM:  Depletions most likely related to advanced age.    Most Recent Value  Orbital Region  Mild depletion  Upper Arm Region  Mild depletion  Thoracic and Lumbar Region  Unable to assess  Buccal Region  Mild depletion  Temple Region  Mild depletion  Clavicle Bone Region  Mild depletion  Clavicle and Acromion Bone Region  Mild depletion  Dorsal Hand  Mild depletion  Patellar Region  Unable to assess  Anterior Thigh Region  Unable to assess  Posterior Calf Region  Unable to assess  Edema (RD Assessment)  Mild       Diet Order:  Diet Heart Room service appropriate? Yes; Fluid consistency: Thin Diet NPO time specified  EDUCATION NEEDS:   No education needs have been identified at this time  Skin:   Skin Assessment: Reviewed RN Assessment  Last BM:  12/21  Height:   Ht Readings from Last 1 Encounters:  08/25/17 5\' 4"  (1.626 m)    Weight:   Wt Readings from Last 1 Encounters:  08/25/17 168 lb 14 oz (76.6 kg)    Ideal Body Weight:  54.5 kg  BMI:  Body mass index is 28.99 kg/m.  Estimated Nutritional Needs:   Kcal:  1500-1700  Protein:  65-75g  Fluid:  1.7L/day  Clayton Bibles, MS, RD, LDN [place] [facility] Pager: [phone] After Hours Pager: [phone]

## 2017-08-27 NOTE — Progress Notes (Signed)
ANTICOAGULATION CONSULT NOTE - Follow Up  Pharmacy Consult for IV heparin Indication: atrial fibrillation  Allergies  Allergen Reactions  . Flagyl [Metronidazole] Hives  . Lisinopril Cough    Patient Measurements: Height: 5\' 4"  (162.6 cm) Weight: 168 lb 14 oz (76.6 kg) IBW/kg (Calculated) : 54.7 Heparin Dosing Weight: 70.8  Vital Signs: Temp: 97.5 F (36.4 C) (12/23 0700) Temp Source: Axillary (12/23 0700) BP: 123/60 (12/23 0700) Pulse Rate: 55 (12/23 0019)  Labs: Recent Labs    08/25/17 1320 08/25/17 1912 08/26/17 0626 08/27/17 0626  HGB 14.1  --  12.3  --   HCT 41.4  --  38.5  --   PLT 229  --  235  --   APTT  --  35  --  130*  LABPROT  --  16.6*  --   --   INR  --  1.36  --   --   HEPARINUNFRC  --   --   --  1.42*  CREATININE 0.71  --  0.82  --     Estimated Creatinine Clearance: 45.7 mL/min (by C-G formula based on SCr of 0.82 mg/dL).   Medical History: Past Medical History:  Diagnosis Date  . DDD (degenerative disc disease), cervical    severe/  auto fusion c2-c5  . Diverticulosis   . Hemorrhoids   . History of adenomatous polyp of colon    1995  adenomatous polyp/  1997 & 2003  hyperplastic polyp's  . History of bladder cancer urologist-  dr [name]   s/p  TURBT's  . History of cancer of ureter   . Hypertension   . Hypothyroid   . Nocturia   . Osteoarthritis   . Osteopenia     Medications:  Scheduled:  . amiodarone  200 mg Oral Daily  . amLODipine  2.5 mg Oral Daily  . levothyroxine  50 mcg Oral QAC breakfast  . metoprolol tartrate  25 mg Oral BID   Infusions:  . heparin 850 Units/hr (08/26/17 1850)    Assessment: 83 yo female presented with fractured ankle on Eliquis PTA for Afib. Last dose of apixaban was 12/21 at 0830 per med history. Patient has been on SQ UFH 5000 units q8 since admission for DVT ppx with last dose around 1400 today. To transition to IV heparin until planned ankle surgery on Monday 12/24. Baseline labs good. APTT 35  this AM.   Today, 08/27/17  Heparin level and aPTT supratherapeutic on current rate of 850 units/hr  CBC stable  No reported bleeding per RN  Noted plan for surgery tomorrow - will await orders from ortho as to when to stop IV heparin - Looks surgery is scheduled for 0800 12/24 so likely turn off IV heparin around 0400 probably  Goal of Therapy:  Heparin level 0.3-0.7 units/ml Monitor platelets by anticoagulation protocol: Yes   Plan:  1) Decrease IV heparin rate from 850 units/hr to 500 units/hr 2) Recheck heparin level8 hours after rate increase 3) Daily CBC  . Adrian Saran, PharmD, BCPS Pager [phone] 08/27/2017 9:29 AM

## 2017-08-28 ENCOUNTER — Encounter (HOSPITAL_COMMUNITY)
Admission: EM | Disposition: A | Discharge status: Skilled Nursing Facility | Payer: Self-pay | Source: Home / Self Care | Attending: Internal Medicine

## 2017-08-28 ENCOUNTER — Inpatient Hospital Stay (HOSPITAL_COMMUNITY): Payer: Medicare Other | Admitting: Anesthesiology

## 2017-08-28 ENCOUNTER — Inpatient Hospital Stay (HOSPITAL_COMMUNITY): Payer: Medicare Other

## 2017-08-28 DIAGNOSIS — I482 Chronic atrial fibrillation: Secondary | ICD-10-CM | POA: Diagnosis not present

## 2017-08-28 DIAGNOSIS — G8918 Other acute postprocedural pain: Secondary | ICD-10-CM | POA: Diagnosis not present

## 2017-08-28 DIAGNOSIS — S82892A Other fracture of left lower leg, initial encounter for closed fracture: Secondary | ICD-10-CM | POA: Diagnosis not present

## 2017-08-28 DIAGNOSIS — C679 Malignant neoplasm of bladder, unspecified: Secondary | ICD-10-CM | POA: Diagnosis not present

## 2017-08-28 HISTORY — PX: ORIF ANKLE FRACTURE: SHX5408

## 2017-08-28 LAB — BASIC METABOLIC PANEL
ANION GAP: 7 (ref 5–15)
BUN: 16 mg/dL (ref 6–20)
CO2: 27 mmol/L (ref 22–32)
Calcium: 8.7 mg/dL — ABNORMAL LOW (ref 8.9–10.3)
Chloride: 101 mmol/L (ref 101–111)
Creatinine, Ser: 0.72 mg/dL (ref 0.44–1.00)
GFR calc Af Amer: >60 mL/min (ref >60)
GLUCOSE: 95 mg/dL (ref 65–99)
POTASSIUM: 4.1 mmol/L (ref 3.5–5.1)
Sodium: 135 mmol/L (ref 135–145)

## 2017-08-28 LAB — CBC
HEMATOCRIT: 37.6 % (ref 36.0–46.0)
Hemoglobin: 12.1 g/dL (ref 12.0–15.0)
MCH: 31 pg (ref 26.0–34.0)
MCHC: 32.2 g/dL (ref 30.0–36.0)
MCV: 96.4 fL (ref 78.0–100.0)
PLATELETS: 199 10*3/uL (ref 150–400)
RBC: 3.9 MIL/uL (ref 3.87–5.11)
RDW: 13.8 % (ref 11.5–15.5)
WBC: 6.7 10*3/uL (ref 4.0–10.5)

## 2017-08-28 SURGERY — OPEN REDUCTION INTERNAL FIXATION (ORIF) ANKLE FRACTURE
Anesthesia: General | Site: Ankle | Laterality: Left

## 2017-08-28 MED ORDER — 0.9 % SODIUM CHLORIDE (POUR BTL) OPTIME
TOPICAL | Status: DC | PRN
  Administered 2017-08-28: 1000 mL

## 2017-08-28 MED ORDER — PROPOFOL 10 MG/ML IV BOLUS
INTRAVENOUS | Status: AC
  Filled 2017-08-28: qty 20

## 2017-08-28 MED ORDER — CEFAZOLIN SODIUM-DEXTROSE 2-4 GM/100ML-% IV SOLN
2.0000 g | Freq: Three times a day (TID) | INTRAVENOUS | Status: AC
  Administered 2017-08-28 – 2017-08-29 (×3): 2 g via INTRAVENOUS
  Filled 2017-08-28 (×3): qty 100

## 2017-08-28 MED ORDER — CEFAZOLIN SODIUM-DEXTROSE 2-4 GM/100ML-% IV SOLN
2.0000 g | Freq: Once | INTRAVENOUS | Status: AC
  Administered 2017-08-28: 2 g via INTRAVENOUS

## 2017-08-28 MED ORDER — PROPOFOL 500 MG/50ML IV EMUL
INTRAVENOUS | Status: DC | PRN
  Administered 2017-08-28: 50 ug/kg/min via INTRAVENOUS

## 2017-08-28 MED ORDER — ONDANSETRON HCL 4 MG/2ML IJ SOLN
INTRAMUSCULAR | Status: DC | PRN
  Administered 2017-08-28: 4 mg via INTRAVENOUS

## 2017-08-28 MED ORDER — EPHEDRINE SULFATE-NACL 50-0.9 MG/10ML-% IV SOSY
PREFILLED_SYRINGE | INTRAVENOUS | Status: DC | PRN
  Administered 2017-08-28 (×3): 5 mg via INTRAVENOUS

## 2017-08-28 MED ORDER — VANCOMYCIN HCL 1000 MG IV SOLR
INTRAVENOUS | Status: AC
  Filled 2017-08-28: qty 1000

## 2017-08-28 MED ORDER — LACTATED RINGERS IV SOLN: INTRAVENOUS | Status: DC

## 2017-08-28 MED ORDER — ONDANSETRON HCL 4 MG/2ML IJ SOLN: 4.0000 mg | Freq: Once | INTRAMUSCULAR | Status: DC | PRN

## 2017-08-28 MED ORDER — CEFAZOLIN SODIUM-DEXTROSE 2-4 GM/100ML-% IV SOLN
INTRAVENOUS | Status: AC
  Filled 2017-08-28: qty 100

## 2017-08-28 MED ORDER — LACTATED RINGERS IV SOLN
INTRAVENOUS | Status: DC | PRN
  Administered 2017-08-28: 08:00:00 via INTRAVENOUS

## 2017-08-28 MED ORDER — BACITRACIN ZINC 500 UNIT/GM EX OINT
TOPICAL_OINTMENT | CUTANEOUS | Status: AC
  Filled 2017-08-28: qty 28.35

## 2017-08-28 MED ORDER — VANCOMYCIN HCL 1000 MG IV SOLR
INTRAVENOUS | Status: DC | PRN
  Administered 2017-08-28: 1000 mg

## 2017-08-28 MED ORDER — ONDANSETRON HCL 4 MG/2ML IJ SOLN
INTRAMUSCULAR | Status: AC
  Filled 2017-08-28: qty 2

## 2017-08-28 MED ORDER — FENTANYL CITRATE (PF) 100 MCG/2ML IJ SOLN
INTRAMUSCULAR | Status: DC | PRN
  Administered 2017-08-28 (×2): 25 ug via INTRAVENOUS

## 2017-08-28 MED ORDER — HEPARIN (PORCINE) IN NACL 100-0.45 UNIT/ML-% IJ SOLN
400.0000 [IU]/h | INTRAMUSCULAR | Status: DC
  Administered 2017-08-28: 400 [IU]/h via INTRAVENOUS
  Filled 2017-08-28: qty 250

## 2017-08-28 MED ORDER — EPHEDRINE 5 MG/ML INJ
INTRAVENOUS | Status: AC
  Filled 2017-08-28: qty 10

## 2017-08-28 MED ORDER — PROPOFOL 10 MG/ML IV BOLUS
INTRAVENOUS | Status: DC | PRN
  Administered 2017-08-28: 100 mg via INTRAVENOUS

## 2017-08-28 MED ORDER — FENTANYL CITRATE (PF) 100 MCG/2ML IJ SOLN: 25.0000 ug | INTRAMUSCULAR | Status: DC | PRN

## 2017-08-28 MED ORDER — FENTANYL CITRATE (PF) 100 MCG/2ML IJ SOLN
INTRAMUSCULAR | Status: AC
  Filled 2017-08-28: qty 2

## 2017-08-28 MED ORDER — LIDOCAINE 2% (20 MG/ML) 5 ML SYRINGE
INTRAMUSCULAR | Status: DC | PRN
  Administered 2017-08-28: 80 mg via INTRAVENOUS

## 2017-08-28 MED ORDER — BUPIVACAINE-EPINEPHRINE (PF) 0.5% -1:200000 IJ SOLN
INTRAMUSCULAR | Status: DC | PRN
  Administered 2017-08-28 (×2): 20 mL via PERINEURAL

## 2017-08-28 SURGICAL SUPPLY — 68 items
BAG SPEC THK2 15X12 ZIP CLS (MISCELLANEOUS) ×1
BAG ZIPLOCK 12X15 (MISCELLANEOUS) ×3 IMPLANT
BANDAGE ACE 4X5 VEL STRL LF (GAUZE/BANDAGES/DRESSINGS) ×2 IMPLANT
BANDAGE ACE 6X5 VEL STRL LF (GAUZE/BANDAGES/DRESSINGS) ×2 IMPLANT
BANDAGE ESMARK 6X9 LF (GAUZE/BANDAGES/DRESSINGS) ×1 IMPLANT
BIT DRILL 2.5X110 QC LCP DISP (BIT) ×2 IMPLANT
BIT DRILL QC 3.5X110 (BIT) ×2 IMPLANT
BNDG CMPR 9X6 STRL LF SNTH (GAUZE/BANDAGES/DRESSINGS) ×1
BNDG COHESIVE 4X5 TAN STRL (GAUZE/BANDAGES/DRESSINGS) ×3 IMPLANT
BNDG ESMARK 6X9 LF (GAUZE/BANDAGES/DRESSINGS) ×3
BNDG GAUZE ELAST 4 BULKY (GAUZE/BANDAGES/DRESSINGS) ×6 IMPLANT
CUFF TOURN SGL QUICK 34 (TOURNIQUET CUFF) ×3
CUFF TRNQT CYL 34X4X40X1 (TOURNIQUET CUFF) ×1 IMPLANT
DECANTER SPIKE VIAL GLASS SM (MISCELLANEOUS) ×3 IMPLANT
DRAPE C-ARM 42X120 X-RAY (DRAPES) IMPLANT
DRAPE C-ARMOR (DRAPES) ×3 IMPLANT
DRAPE OEC MINIVIEW 54X84 (DRAPES) IMPLANT
DRAPE ORTHO SPLIT 77X108 STRL (DRAPES) ×3
DRAPE SHEET LG 3/4 BI-LAMINATE (DRAPES) ×3 IMPLANT
DRAPE SURG ORHT 6 SPLT 77X108 (DRAPES) ×1 IMPLANT
DRAPE U-SHAPE 47X51 STRL (DRAPES) ×3 IMPLANT
DRSG ADAPTIC 3X8 NADH LF (GAUZE/BANDAGES/DRESSINGS) ×3 IMPLANT
DRSG EMULSION OIL 3X16 NADH (GAUZE/BANDAGES/DRESSINGS) ×2 IMPLANT
DRSG PAD ABDOMINAL 8X10 ST (GAUZE/BANDAGES/DRESSINGS) IMPLANT
ELECT REM PT RETURN 15FT ADLT (MISCELLANEOUS) ×3 IMPLANT
GAUZE SPONGE 4X4 12PLY STRL (GAUZE/BANDAGES/DRESSINGS) ×3 IMPLANT
GLOVE BIO SURGEON STRL SZ7.5 (GLOVE) ×3 IMPLANT
GLOVE BIO SURGEON STRL SZ8 (GLOVE) ×3 IMPLANT
GLOVE BIOGEL PI IND STRL 7.5 (GLOVE) ×1 IMPLANT
GLOVE BIOGEL PI INDICATOR 7.5 (GLOVE) ×2
GOWN STRL REUS W/ TWL XL LVL3 (GOWN DISPOSABLE) ×1 IMPLANT
GOWN STRL REUS W/TWL LRG LVL3 (GOWN DISPOSABLE) ×6 IMPLANT
GOWN STRL REUS W/TWL XL LVL3 (GOWN DISPOSABLE) ×3
K-WIRE 5 THRD TROCAR 1.6X150 (WIRE) ×3
KIT BASIN OR (CUSTOM PROCEDURE TRAY) ×3 IMPLANT
KWIRE 5 THRD TROCAR 1.6X150 (WIRE) IMPLANT
MANIFOLD NEPTUNE II (INSTRUMENTS) ×3 IMPLANT
NEEDLE HYPO 22GX1.5 SAFETY (NEEDLE) ×3 IMPLANT
NS IRRIG 1000ML POUR BTL (IV SOLUTION) ×3 IMPLANT
PACK ORTHO EXTREMITY (CUSTOM PROCEDURE TRAY) ×3 IMPLANT
PAD CAST 4YDX4 CTTN HI CHSV (CAST SUPPLIES) ×2 IMPLANT
PADDING CAST COTTON 4X4 STRL (CAST SUPPLIES) ×6
PADDING CAST COTTON 6X4 STRL (CAST SUPPLIES) IMPLANT
PLATE LCP 3.5 1/3 TUB 6HX69 (Plate) ×3 IMPLANT
POSITIONER SURGICAL ARM (MISCELLANEOUS) ×3 IMPLANT
SCREW CORTEX 3.5 12MM (Screw) ×2 IMPLANT
SCREW CORTEX 3.5 14MM (Screw) ×2 IMPLANT
SCREW CORTEX 3.5 16MM (Screw) ×2 IMPLANT
SCREW CORTEX 3.5 18MM (Screw) ×4 IMPLANT
SCREW CORTEX 3.5 55MM (Screw) ×2 IMPLANT
SCREW LOCK CORT ST 3.5X12 (Screw) IMPLANT
SCREW LOCK CORT ST 3.5X14 (Screw) IMPLANT
SCREW LOCK CORT ST 3.5X16 (Screw) IMPLANT
SCREW LOCK CORT ST 3.5X18 (Screw) IMPLANT
SCREW LOCK T15 FT 16X3.5X2.9X (Screw) IMPLANT
SCREW LOCKING 3.5X16 (Screw) ×3 IMPLANT
SOL PREP PROV IODINE SCRUB 4OZ (MISCELLANEOUS) ×3 IMPLANT
SPLINT FIBERGLASS 4X15 (CAST SUPPLIES) ×3 IMPLANT
SPONGE LAP 18X18 X RAY DECT (DISPOSABLE) ×6 IMPLANT
STAPLER VISISTAT 35W (STAPLE) ×3 IMPLANT
SUT ETHILON 3 0 FSL (SUTURE) ×9 IMPLANT
SUT ETHILON 3 0 PS 1 (SUTURE) ×3 IMPLANT
SUT PDS AB 2-0 CT 36 (SUTURE) IMPLANT
SUT VIC AB 2-0 CT1 27 (SUTURE) ×6
SUT VIC AB 2-0 CT1 TAPERPNT 27 (SUTURE) ×2 IMPLANT
SYR CONTROL 10ML LL (SYRINGE) ×3 IMPLANT
TOWEL OR 17X26 10 PK STRL BLUE (TOWEL DISPOSABLE) ×6 IMPLANT
WATER STERILE IRR 1000ML POUR (IV SOLUTION) ×3 IMPLANT

## 2017-08-28 NOTE — Anesthesia Procedure Notes (Signed)
Procedure Name: LMA Insertion Date/Time: 08/28/2017 8:18 AM Performed by: Victoriano Lain, CRNA Pre-anesthesia Checklist: Patient identified, Emergency Drugs available, Suction available, Patient being monitored and Timeout performed Patient Re-evaluated:Patient Re-evaluated prior to induction Oxygen Delivery Method: Circle system utilized Preoxygenation: Pre-oxygenation with 100% oxygen Induction Type: IV induction Ventilation: Mask ventilation without difficulty LMA: LMA with gastric port inserted LMA Size: 4.0 Number of attempts: 1 Placement Confirmation: positive ETCO2 and breath sounds checked- equal and bilateral Tube secured with: Tape Dental Injury: Teeth and Oropharynx as per pre-operative assessment

## 2017-08-28 NOTE — Anesthesia Procedure Notes (Signed)
Procedures

## 2017-08-28 NOTE — Anesthesia Procedure Notes (Signed)
Anesthesia Regional Block: Popliteal block   Pre-Anesthetic Checklist: ,, timeout performed, Correct Patient, Correct Site, Correct Laterality, Correct Procedure, Correct Position, site marked, Risks and benefits discussed,  Surgical consent,  Pre-op evaluation,  At surgeon's request and post-op pain management  Laterality: Left  Prep: chloraprep       Needles:  Injection technique: Single-shot  Needle Type: Echogenic Needle     Needle Length: 9cm  Needle Gauge: 21     Additional Needles:   Narrative:  Start time: 08/28/2017 7:46 AM End time: 08/28/2017 7:49 AM Injection made incrementally with aspirations every 5 mL.  Performed by: Personally  Anesthesiologist: Audry Pili, MD  Additional Notes: No pain on injection. No increased resistance to injection. Injection made in 5cc increments. Good needle visualization. Patient tolerated the procedure well.

## 2017-08-28 NOTE — Interval H&P Note (Signed)
History and Physical Interval Note:  08/28/2017 7:34 AM  Rebecca Norman  has presented today for surgery, with the diagnosis of Left Ankle Fracture  The various methods of treatment have been discussed with the patient and family. After consideration of risks, benefits and other options for treatment, the patient has consented to  Procedure(s): OPEN REDUCTION INTERNAL FIXATION (ORIF) ANKLE FRACTURE (Left) as a surgical intervention .  The patient's history has been reviewed, patient examined, no change in status, stable for surgery.  I have reviewed the patient's chart and labs.  Questions were answered to the patient's satisfaction.     Haddix, Thomasene Lot

## 2017-08-28 NOTE — Op Note (Signed)
OrthopaedicSurgeryOperativeNote ([id number]) Date of Surgery: 08/28/2017  Admit Date: 08/25/2017   Diagnoses: Pre-Op Diagnoses: Left trimalleolar ankle fracture dislocation  Post-Op Diagnosis: Same  Procedures: 1. CPT 27822-ORIF of left trimalleolar ankle fracture (lateral malleolus only) 2. CPT 27829-Fixation of left syndesmosis  Surgeons: Primary: Kele Withem, Thomasene Lot, MD   Location:[facility] ROOM 10   AnesthesiaGeneral   Antibiotics:Ancef 2g preop  Tourniquettime: Total Tourniquet Time Documented: Thigh (Left) - 51 minutes Total: Thigh (Left) - 51 minutes  [id number]:53 mL   Complications:None  Specimens:None  Implants: Implant Name Type Inv. Item Serial No. Manufacturer Lot No. LRB No. Used Action  PLATE TUBULAR W/COLLAR - [id number] Plate PLATE TUBULAR W/COLLAR  [employer]  Left 1 Implanted  SCREW CORTEX 3.5 12MM - [id number] Screw SCREW CORTEX 3.5 12MM  [employer]  Left 1 Implanted  SCREW CORTEX 3.5 14MM - [id number] Screw SCREW CORTEX 3.5 14MM  [employer]  Left 1 Implanted  SCREW CORTEX 3.5 16MM - [id number] Screw SCREW CORTEX 3.5 16MM  [employer]  Left 1 Implanted  SCREW CORTEX 3.5 55MM - [id number] Screw SCREW CORTEX 3.5 55MM  [employer]  Left 1 Implanted  SCREW LOCKING 3.5X16 - [id number] Screw SCREW LOCKING 3.5X16  [employer]  Left 1 Implanted    IndicationsforSurgery: This is a 83 year old female who sustained a ground-level fall and fractured her left ankle.  Presented to the emergency room the morning after her fall was found to have a left ankle fracture with subluxation.  It showed a displaced lateral malleolus fracture along with a posterior malleolus fracture that was small as well as a avulsion of the medial malleolus likely still attached to the deltoid.  I felt due to her been living independently and the amount of displacement that ORIF was most appropriate.  She has a history of A. fib and is on Eliquis and she was  bridged with a heparin drip.  Discussed with her the risks and benefits of proceeding with surgery. Risks discussed included bleeding requiring blood transfusion, bleeding causing a hematoma, infection, malunion, nonunion, damage to surrounding nerves and blood vessels, pain, hardware prominence or irritation, hardware failure, stiffness, post-traumatic arthritis, DVT/PE, compartment syndrome, and even death. Risks and benefits were extensively discussed as noted above and the patient and their family agreed to proceed with surgery and consent was obtained.  Operative Findings: 1. Unstable trimalleolar ankle fracture dislocation with syndesmotic disruption. Small avulsion of medial malleolus with deltoid 2. ORIF of left ankle with [employer]s 6-hole 1/3rd tubular plate with locking fixation distally and one quadricortical screw for syndesmotic fixation  Procedure: The patient was identified in the preoperative holding area. Consent was confirmed with the patient and their family and all questions were answered. The operative extremity was marked after confirmation with the patient. The patient was then brought back to the operating room by our anesthesia colleagues.  The were carefully transferred over to a regular or table.  They were placed under general anesthetic.  A bump was placed under the operative hip.  The operative extremity was then prepped and draped in usual sterile fashion. A preoperative timeout was performed to verify the patient, the procedure, and the extremity. Preoperative antibiotics were dosed.  The lower extremity was elevated and the tourniquet was inflated to 300 mmHg.  Please see the tourniquet time as noted above.  An incision was then made over the fracture of the lateral malleolus.  It was carried down through skin and subcutaneous tissue.  The fracture site was identified.  It was cleaned out using a 15 blade as well as a curette and suction.  Clamp was used to reduce the fracture  anatomically and then provisional K wire fixation was used to hold the fracture reduced.  The fracture was typical of the FCR fracture pattern with posterior and lateral displacement of the distal segment.  As result I contoured a 6-hole one third tubular plate and placed it in a posterior buttress fashion.  Distal hole screw was placed and then proximal shaft screw was placed as well.  Total of 5 out of the 6 holes were filled with screws.  At this point I then stressed the ankle with an external rotation stress.  There was some talar tilt but overall the felt that there was medial clear space widening as well.  As result of this I proceeded to remove the second most distal screw in place in a quadrant cortical syndesmotic screw to help hold the syndesmosis reduced.  The distal screw due to her poor bone quality had relatively poor purchase so I removed this and placed a locking screw into the distal lateral malleolus.  After the screws were placed I performed a another external rotation stress test and there is no syndesmotic widening or medial clear space widening.  There was some slight talar tilt due to the likely deltoid avulsion.  Because of her soft tissue swelling in her anticoagulation I did not feel the medial approach was necessary in this 83 year old female.  The incision was then thoroughly irrigated a gram of vancomycin powder was placed into the wound.  The incision was then closed with 2-0 Vicryl and 3-0 nylon.  A sterile dressing consisting of bacitracin ointment, Adaptic, 4 x 4's and sterile cast padding with a well-padded short leg splint was placed.  She was then awoken from anesthesia and taken to the PACU in stable condition.  Post Op Plan/Instructions: The patient will be nonweightbearing left lower extremity.  I will likely transition from a splint to a short leg cast while she is inpatient or at her first postoperative visit.  I would likely keep her on nonweightbearing for  approximately 6 weeks.  She may restart her anticoagulation postoperative day 0.  She will receive postoperative Ancef.  Suture removal will be in approximately 2 weeks.  I was present and performed the entire surgery.  Katha Hamming, MD [facility]

## 2017-08-28 NOTE — Anesthesia Postprocedure Evaluation (Signed)
Anesthesia Post Note  Patient: Rebecca Norman  Procedure(s) Performed: OPEN REDUCTION INTERNAL FIXATION (ORIF) ANKLE FRACTURE (Left Ankle)     Patient location during evaluation: PACU Anesthesia Type: General Level of consciousness: awake and alert Pain management: pain level controlled Vital Signs Assessment: post-procedure vital signs reviewed and stable Respiratory status: spontaneous breathing, nonlabored ventilation, respiratory function stable and patient connected to nasal cannula oxygen Cardiovascular status: blood pressure returned to baseline and stable Postop Assessment: no apparent nausea or vomiting Anesthetic complications: no    Last Vitals:  Vitals:   08/28/17 1030 08/28/17 1045  BP: 127/61 (!) 130/50  Pulse: (!) 59 60  Resp: 20 14  Temp: (!) 36.4 C   SpO2: 95% 95%    Last Pain:  Vitals:   08/28/17 0447  TempSrc: Oral  PainSc:                  Audry Pili

## 2017-08-28 NOTE — Progress Notes (Signed)
Physical Therapy Cancellation  Pt at surgery, will evaluate tomorrow.   Blondell Reveal Kistler PT 08/28/2017  [phone]

## 2017-08-28 NOTE — Transfer of Care (Signed)
Immediate Anesthesia Transfer of Care Note  Patient: Rebecca Norman  Procedure(s) Performed: OPEN REDUCTION INTERNAL FIXATION (ORIF) ANKLE FRACTURE (Left Ankle)  Patient Location: PACU  Anesthesia Type:General  Level of Consciousness: awake, alert , oriented and patient cooperative  Airway & Oxygen Therapy: Patient Spontanous Breathing and Patient connected to face mask oxygen  Post-op Assessment: Report given to RN, Post -op Vital signs reviewed and stable and Patient moving all extremities  Post vital signs: Reviewed and stable  Last Vitals:  Vitals:   08/28/17 0016 08/28/17 0447  BP: (!) 124/48 (!) 139/53  Pulse: (!) 54 (!) 57  Resp: 18 18  Temp: 36.8 C (!) 36.4 C  SpO2: 97% 96%    Last Pain:  Vitals:   08/28/17 0447  TempSrc: Oral  PainSc:          Complications: No apparent anesthesia complications

## 2017-08-28 NOTE — Progress Notes (Signed)
ANTICOAGULATION CONSULT NOTE - Follow Up  Pharmacy Consult for IV heparin Indication: atrial fibrillation  Allergies  Allergen Reactions  . Flagyl [Metronidazole] Hives  . Lisinopril Cough    Patient Measurements: Height: 5\' 4"  (162.6 cm) Weight: 168 lb 14 oz (76.6 kg) IBW/kg (Calculated) : 54.7 Heparin Dosing Weight: 70.8  Vital Signs: Temp: 97.5 F (36.4 C) (12/24 1030) Temp Source: Oral (12/24 0447) BP: 130/50 (12/24 1045) Pulse Rate: 60 (12/24 1045)  Labs: Recent Labs    08/25/17 1320 08/25/17 1912 08/26/17 0626 08/27/17 0626 08/27/17 1908 08/27/17 1909 08/28/17 0536  HGB 14.1  --  12.3  --   --  12.3 12.1  HCT 41.4  --  38.5  --   --  37.7 37.6  PLT 229  --  235  --   --  232 199  APTT  --  35  --  130*  --   --   --   LABPROT  --  16.6*  --   --   --   --   --   INR  --  1.36  --   --   --   --   --   HEPARINUNFRC  --   --   --  1.42* 0.87*  --   --   CREATININE 0.71  --  0.82  --   --   --  0.72    Estimated Creatinine Clearance: 46.9 mL/min (by C-G formula based on SCr of 0.72 mg/dL).   Medical History: Past Medical History:  Diagnosis Date  . DDD (degenerative disc disease), cervical    severe/  auto fusion c2-c5  . Diverticulosis   . Hemorrhoids   . History of adenomatous polyp of colon    1995  adenomatous polyp/  1997 & 2003  hyperplastic polyp's  . History of bladder cancer urologist-  dr [name]   s/p  TURBT's  . History of cancer of ureter   . Hypertension   . Hypothyroid   . Nocturia   . Osteoarthritis   . Osteopenia     Medications:  Scheduled:  . amiodarone  200 mg Oral Daily  . amLODipine  2.5 mg Oral Daily  . feeding supplement (ENSURE ENLIVE)  237 mL Oral BID BM  . levothyroxine  50 mcg Oral QAC breakfast  . metoprolol tartrate  25 mg Oral BID   Infusions:  .  ceFAZolin (ANCEF) IV      Assessment: 83 yo female presented with fractured ankle on Eliquis PTA for Afib. Last dose of apixaban was 12/21 at 0830 per med  history. Patient has been on SQ UFH 5000 units q8 since admission for DVT ppx with last dose around 1400 today. To transition to IV heparin until planned ankle surgery on Monday 12/24. Baseline labs good. APTT 35 this AM.   Today, 08/28/17  Heparin was stopped this AM in anticipation of surgery today  Spoke to Dr. Doreatha Martin who stated that it was ok to resume heparin drip about 6 hours after completion of surgery. Will schedule heparin to start about 1600 today  CBC stable with AM labs   No reported bleeding per RN  Goal of Therapy:  Heparin level 0.3-0.7 units/ml Monitor platelets by anticoagulation protocol: Yes   Plan:   Restart heparin at 400 units/hr as per previous rate  Heparin level 8 hours after start at 0000  CBC ordered with AM labs    Royetta Asal, PharmD, BCPS Pager [phone] 08/28/2017 1:01 PM

## 2017-08-28 NOTE — Anesthesia Procedure Notes (Signed)
Anesthesia Regional Block: Adductor canal block   Pre-Anesthetic Checklist: ,, timeout performed, Correct Patient, Correct Site, Correct Laterality, Correct Procedure, Correct Position, site marked, Risks and benefits discussed,  Surgical consent,  Pre-op evaluation,  At surgeon's request and post-op pain management  Laterality: Left  Prep: chloraprep       Needles:  Injection technique: Single-shot  Needle Type: Echogenic Needle     Needle Length: 9cm  Needle Gauge: 21     Additional Needles:   Narrative:  Start time: 08/28/2017 7:41 AM End time: 08/28/2017 7:44 AM Injection made incrementally with aspirations every 5 mL.  Performed by: Personally  Anesthesiologist: Audry Pili, MD  Additional Notes: No pain on injection. No increased resistance to injection. Injection made in 5cc increments. Good needle visualization. Patient tolerated the procedure well.

## 2017-08-28 NOTE — Progress Notes (Signed)
PROGRESS NOTE    Rebecca Norman  DPO:[id number] DOB: 13-Apr-1925 DOA: 08/25/2017 PCP: [name], MD    Brief Narrative:  Rebecca Norman is a 83 y.o. female with medical history significant of hypertension, hypothyroidism, bladder cancer (s/p of TURB), DDD, diverticulosis, atrial fibrillation on Eliquis, dCHF, who presents with fall and left ankle pain. CT of the left ankle showed oblique fracture of the distal fibular metaphysis with lateral and posterior displacement.  08/28/17: Patient seen alongside patient's son and daughter in law. Patient is status post open reduction and internal fixation of left ankle fracture.  Assessment & Plan:   Principal Problem:   Closed left ankle fracture Active Problems:   HTN (hypertension)   Hypothyroid   Atrial fibrillation, chronic (HCC)   Fall   Chronic diastolic CHF (congestive heart failure) (HCC)   Closed Left ankle fracture:  Patient is status ORIF of left ankle fracture Continue post op management. Adequate pain control.  Incentive spirometry.  Hypertension:  Optimize.   Chronic atrial fibrillation:  Rate well controlled. Patient on eliquis, changed to IV heparin perioperatively. Revert back to Eliquis once ok with orthopedics.  Rate control with bb and amiodarone.   Hypothyroidism:  Resume synthroid.   Chronic diastolic heart failure:  She appears compensated.   DVT prophylaxis: heparin gtt.  Code Status: (full code.  Family Communication: none at bedtime.  Disposition Plan: pending further evaluation by orthopedics. Possibly OR on Sunday.   Consultants:   Orthopedics.    Procedures: ORIF left ankle   Antimicrobials:none.    Subjective: Nil new complaints.  Objective: Vitals:   08/28/17 1015 08/28/17 1030 08/28/17 1045 08/28/17 1352  BP: (!) 131/53 127/61 (!) 130/50 116/63  Pulse: (!) 59 (!) 59 60 66  Resp: 17 20 14 18   Temp:  (!) 97.5 F (36.4 C)  98.4 F (36.9 C)  TempSrc:      SpO2: 94%  95% 95% 98%  Weight:      Height:        Intake/Output Summary (Last 24 hours) at 08/28/2017 1854 Last data filed at 08/28/2017 1749 Gross per 24 hour  Intake 1840 ml  Output 15 ml  Net 1825 ml   Filed Weights   08/25/17 1059 08/25/17 2256  Weight: 77.1 kg (170 lb) 76.6 kg (168 lb 14 oz)    Examination:  General exam: Not in any distress. Respiratory system: Decreased air entry posteriorly Cardiovascular system: S1 & S2 heard, RRR. No JVD, murmurs. No pedal edema. Gastrointestinal system: Abdomen is soft NT ND BS+ Central nervous system: Alert and oriented. No focal neurological deficits. Extremities: LEFT ANKLE IN BANDAGES.  Skin: No rashes, lesions or ulcers Psychiatry: . Mood & affect appropriate.    Data Reviewed: I have personally reviewed following labs and imaging studies  CBC: Recent Labs  Lab 08/25/17 1320 08/26/17 0626 08/27/17 1909 08/28/17 0536  WBC 11.2* 8.7 10.0 6.7  NEUTROABS 9.1*  --   --   --   HGB 14.1 12.3 12.3 12.1  HCT 41.4 38.5 37.7 37.6  MCV 94.1 95.8 96.2 96.4  PLT 229 235 232 431   Basic Metabolic Panel: Recent Labs  Lab 08/23/17 1118 08/25/17 1320 08/26/17 0626 08/28/17 0536  NA 138 136 136 135  K 4.7 4.0 3.8 4.1  CL 99 102 102 101  CO2 24 26 28 27   GLUCOSE 94 94 90 95  BUN 17 18 17 16   CREATININE 0.87 0.71 0.82 0.72  CALCIUM 9.4 8.9 8.5* 8.7*  GFR: Estimated Creatinine Clearance: 46.9 mL/min (by C-G formula based on SCr of 0.72 mg/dL). Liver Function Tests: No results for input(s): AST, ALT, ALKPHOS, BILITOT, PROT, ALBUMIN in the last 168 hours. No results for input(s): LIPASE, AMYLASE in the last 168 hours. No results for input(s): AMMONIA in the last 168 hours. Coagulation Profile: Recent Labs  Lab 08/25/17 1912  INR 1.36   Cardiac Enzymes: No results for input(s): CKTOTAL, CKMB, CKMBINDEX, TROPONINI in the last 168 hours. BNP (last 3 results) No results for input(s): PROBNP in the last 8760 hours. HbA1C: No  results for input(s): HGBA1C in the last 72 hours. CBG: No results for input(s): GLUCAP in the last 168 hours. Lipid Profile: No results for input(s): CHOL, HDL, LDLCALC, TRIG, CHOLHDL, LDLDIRECT in the last 72 hours. Thyroid Function Tests: No results for input(s): TSH, T4TOTAL, FREET4, T3FREE, THYROIDAB in the last 72 hours. Anemia Panel: No results for input(s): VITAMINB12, FOLATE, FERRITIN, TIBC, IRON, RETICCTPCT in the last 72 hours. Sepsis Labs: No results for input(s): PROCALCITON, LATICACIDVEN in the last 168 hours.  Recent Results (from the past 240 hour(s))  Surgical pcr screen     Status: None   Collection Time: 08/27/17  6:26 PM  Result Value Ref Range Status   MRSA, PCR NEGATIVE NEGATIVE Final   Staphylococcus aureus NEGATIVE NEGATIVE Final    Comment: (NOTE) The Xpert SA Assay (FDA approved for NASAL specimens in patients 40 years of age and older), is one component of a comprehensive surveillance program. It is not intended to diagnose infection nor to guide or monitor treatment.          Radiology Studies: Dg Ankle Complete Left  Result Date: 08/28/2017 CLINICAL DATA:  Left ankle ORIF EXAM: LEFT ANKLE COMPLETE - 3+ VIEW COMPARISON:  None. FINDINGS: Oblique distal fibular fracture transfixed with a metallic sideplate and interlocking screws. Single syndesmotic screw transfixing the distal tibiofibular joint. Ankle mortise is congruent. Mild tibiotalar joint space narrowing of the lateral aspect of the tibiotalar joint. No other fracture or dislocation. IMPRESSION: 1. Oblique distal fibular fracture transfixed with a metallic sideplate and interlocking screws. Single syndesmotic screw transfixing the distal tibiofibular joint. Ankle mortise is congruent. Electronically Signed   By: Kathreen Devoid   On: 08/28/2017 12:18   Dg C-arm 1-60 Min-no Report  Result Date: 08/28/2017 Fluoroscopy was utilized by the requesting physician.  No radiographic interpretation.         Scheduled Meds: . amiodarone  200 mg Oral Daily  . amLODipine  2.5 mg Oral Daily  . feeding supplement (ENSURE ENLIVE)  237 mL Oral BID BM  . levothyroxine  50 mcg Oral QAC breakfast  . metoprolol tartrate  25 mg Oral BID   Continuous Infusions: .  ceFAZolin (ANCEF) IV Stopped (08/28/17 1440)  . heparin 400 Units/hr (08/28/17 1600)     LOS: 3 days    Time spent: 35 MINUTES.     Bonnell Public, MD [facility] Pager [phone]   If 7PM-7AM, please contact night-coverage [email or web address] Password [facility] 08/28/2017, 6:54 PM

## 2017-08-29 LAB — CBC
HCT: 34.4 % — ABNORMAL LOW (ref 36.0–46.0)
HEMOGLOBIN: 11.1 g/dL — AB (ref 12.0–15.0)
MCH: 31.4 pg (ref 26.0–34.0)
MCHC: 32.3 g/dL (ref 30.0–36.0)
MCV: 97.2 fL (ref 78.0–100.0)
Platelets: 184 10*3/uL (ref 150–400)
RBC: 3.54 MIL/uL — AB (ref 3.87–5.11)
RDW: 13.8 % (ref 11.5–15.5)
WBC: 8 10*3/uL (ref 4.0–10.5)

## 2017-08-29 LAB — HEPARIN LEVEL (UNFRACTIONATED): Heparin Unfractionated: 0.29 IU/mL — ABNORMAL LOW (ref 0.30–0.70)

## 2017-08-29 LAB — APTT: aPTT: 39 seconds — ABNORMAL HIGH (ref 24–36)

## 2017-08-29 MED ORDER — APIXABAN 5 MG PO TABS
5.0000 mg | ORAL_TABLET | Freq: Two times a day (BID) | ORAL | Status: DC
  Administered 2017-08-29 – 2017-09-01 (×7): 5 mg via ORAL
  Filled 2017-08-29 (×8): qty 1

## 2017-08-29 MED ORDER — HEPARIN (PORCINE) IN NACL 100-0.45 UNIT/ML-% IJ SOLN
500.0000 [IU]/h | INTRAMUSCULAR | Status: DC
  Filled 2017-08-29: qty 250

## 2017-08-29 NOTE — Progress Notes (Signed)
PROGRESS NOTE    Rebecca Norman  NUU:[id number] DOB: May 31, 1925 DOA: 08/25/2017 PCP: [name], MD    Brief Narrative:  Rebecca Norman is a 83 y.o. female with medical history significant of hypertension, hypothyroidism, bladder cancer (s/p of TURB), DDD, diverticulosis, atrial fibrillation on Eliquis, dCHF, who presents with fall and left ankle pain. CT of the left ankle showed oblique fracture of the distal fibular metaphysis with lateral and posterior displacement    Assessment & Plan:   Principal Problem:   Closed left ankle fracture Active Problems:   HTN (hypertension)   Hypothyroid   Atrial fibrillation, chronic (HCC)   Fall   Chronic diastolic CHF (congestive heart failure) (HCC)   Closed Left ankle fracture:  Orthopedics consulted and recommendations given.   status post open reduction and internal fixation of left ankle fracture.  Pain control and PT eval recommending SNF.  Non weight bearing on the left ankle.  ncentive spirometry.    Hypertension well controlled.    Chronic atrial fibrillation  Rate controlled. Resume eliquis.   Rate control with Bb and amiodarone.    Hypothyroidism:  Resume synthroid.    Hypertension:  Well controlled.    Chronic atrial fibrillation:  Rate well controlled. Patient on eliquis, changed to IV heparin.  Rate control with bb and amiodarone.    Hypothyroidism:  Resume synthroid.    Chronic diastolic heart failure:  She appears compensated.  Holding on lasix.      DVT prophylaxis: eliqus.  Code Status: (full code.  Family Communication: none at bedtime.  Disposition Plan: SNF on discharge.   Consultants:   Orthopedics.    Procedures: none.    Antimicrobials:none.    Subjective: Pain well controlled.   Objective: Vitals:   08/29/17 0848 08/29/17 1114 08/29/17 1156 08/29/17 1607  BP: 118/64 111/62 (!) 120/42 (!) 131/58  Pulse: 75 65 (!) 52 (!) 56  Resp:   16 16  Temp:   97.6 F  (36.4 C) 97.6 F (36.4 C)  TempSrc:   Axillary Oral  SpO2:   100% 100%  Weight:      Height:        Intake/Output Summary (Last 24 hours) at 08/29/2017 1648 Last data filed at 08/29/2017 1500 Gross per 24 hour  Intake 720 ml  Output 1600 ml  Net -880 ml   Filed Weights   08/25/17 1059 08/25/17 2256  Weight: 77.1 kg (170 lb) 76.6 kg (168 lb 14 oz)    Examination:  General exam: Appears in mild distress from pain.  Respiratory system: Clear to auscultation. Respiratory effort normal. No wheezing or rhonchi.  Cardiovascular system: S1 & S2 heard, RRR. No JVD, murmurs. No pedal edema. Gastrointestinal system: Abdomen is soft NT ND BS+ Central nervous system: Alert and oriented. No focal neurological deficits. Extremities: LEFT ANKLE IN BANDAGES.  Skin: No rashes, lesions or ulcers Psychiatry: . Mood & affect appropriate.     Data Reviewed: I have personally reviewed following labs and imaging studies  CBC: Recent Labs  Lab 08/25/17 1320 08/26/17 0626 08/27/17 1909 08/28/17 0536 08/29/17 0505  WBC 11.2* 8.7 10.0 6.7 8.0  NEUTROABS 9.1*  --   --   --   --   HGB 14.1 12.3 12.3 12.1 11.1*  HCT 41.4 38.5 37.7 37.6 34.4*  MCV 94.1 95.8 96.2 96.4 97.2  PLT 229 235 232 199 347   Basic Metabolic Panel: Recent Labs  Lab 08/23/17 1118 08/25/17 1320 08/26/17 0626 08/28/17 0536  NA  138 136 136 135  K 4.7 4.0 3.8 4.1  CL 99 102 102 101  CO2 24 26 28 27   GLUCOSE 94 94 90 95  BUN 17 18 17 16   CREATININE 0.87 0.71 0.82 0.72  CALCIUM 9.4 8.9 8.5* 8.7*   GFR: Estimated Creatinine Clearance: 46.9 mL/min (by C-G formula based on SCr of 0.72 mg/dL). Liver Function Tests: No results for input(s): AST, ALT, ALKPHOS, BILITOT, PROT, ALBUMIN in the last 168 hours. No results for input(s): LIPASE, AMYLASE in the last 168 hours. No results for input(s): AMMONIA in the last 168 hours. Coagulation Profile: Recent Labs  Lab 08/25/17 1912  INR 1.36   Cardiac Enzymes: No  results for input(s): CKTOTAL, CKMB, CKMBINDEX, TROPONINI in the last 168 hours. BNP (last 3 results) No results for input(s): PROBNP in the last 8760 hours. HbA1C: No results for input(s): HGBA1C in the last 72 hours. CBG: No results for input(s): GLUCAP in the last 168 hours. Lipid Profile: No results for input(s): CHOL, HDL, LDLCALC, TRIG, CHOLHDL, LDLDIRECT in the last 72 hours. Thyroid Function Tests: No results for input(s): TSH, T4TOTAL, FREET4, T3FREE, THYROIDAB in the last 72 hours. Anemia Panel: No results for input(s): VITAMINB12, FOLATE, FERRITIN, TIBC, IRON, RETICCTPCT in the last 72 hours. Sepsis Labs: No results for input(s): PROCALCITON, LATICACIDVEN in the last 168 hours.  Recent Results (from the past 240 hour(s))  Surgical pcr screen     Status: None   Collection Time: 08/27/17  6:26 PM  Result Value Ref Range Status   MRSA, PCR NEGATIVE NEGATIVE Final   Staphylococcus aureus NEGATIVE NEGATIVE Final    Comment: (NOTE) The Xpert SA Assay (FDA approved for NASAL specimens in patients 36 years of age and older), is one component of a comprehensive surveillance program. It is not intended to diagnose infection nor to guide or monitor treatment.          Radiology Studies: Dg Ankle Complete Left  Result Date: 08/28/2017 CLINICAL DATA:  Left ankle ORIF EXAM: LEFT ANKLE COMPLETE - 3+ VIEW COMPARISON:  None. FINDINGS: Oblique distal fibular fracture transfixed with a metallic sideplate and interlocking screws. Single syndesmotic screw transfixing the distal tibiofibular joint. Ankle mortise is congruent. Mild tibiotalar joint space narrowing of the lateral aspect of the tibiotalar joint. No other fracture or dislocation. IMPRESSION: 1. Oblique distal fibular fracture transfixed with a metallic sideplate and interlocking screws. Single syndesmotic screw transfixing the distal tibiofibular joint. Ankle mortise is congruent. Electronically Signed   By: Kathreen Devoid   On:  08/28/2017 12:18   Dg C-arm 1-60 Min-no Report  Result Date: 08/28/2017 Fluoroscopy was utilized by the requesting physician.  No radiographic interpretation.        Scheduled Meds: . amiodarone  200 mg Oral Daily  . amLODipine  2.5 mg Oral Daily  . apixaban  5 mg Oral BID  . feeding supplement (ENSURE ENLIVE)  237 mL Oral BID BM  . levothyroxine  50 mcg Oral QAC breakfast  . metoprolol tartrate  25 mg Oral BID   Continuous Infusions:    LOS: 4 days    Time spent: 35 MINUTES.     Hosie Poisson, MD [facility] Pager [phone]   If 7PM-7AM, please contact night-coverage [email or web address] Password [facility] 08/29/2017, 4:48 PM

## 2017-08-29 NOTE — Evaluation (Signed)
Physical Therapy Evaluation Patient Details Name: Rebecca Norman MRN: [id number] DOB: Feb 07, 1925 Today's Date: 08/29/2017   History of Present Illness  Rebecca Norman is a 83 y.o. female with medical history significant of hypertension, hypothyroidism, bladder cancer (s/p of TURB), DDD, divertisis, atrial fibrillation on Eliquis, dCHF, who presents with fall and left ankle fracture and with ORIF performed 08/28/17.    Clinical Impression  Pt now status post op for ORIF of L ankle fx and presents with functional mobility limitations 2* generalized weakness, post op pain, balance deficits and NWB status on L LE.  Pt will require follow up rehab at SNF level to maximize safety and IND.    Follow Up Recommendations SNF    Equipment Recommendations  None recommended by PT    Recommendations for Other Services       Precautions / Restrictions Precautions Precautions: Fall Precaution Comments: NWB LLE Restrictions Weight Bearing Restrictions: Yes LLE Weight Bearing: Non weight bearing      Mobility  Bed Mobility Overal bed mobility: Needs Assistance Bed Mobility: Supine to Sit;Sit to Supine     Supine to sit: Mod assist;+2 for physical assistance;+2 for safety/equipment Sit to supine: Mod assist;+2 for physical assistance;+2 for safety/equipment   General bed mobility comments: Cues for sequence and use of R LE to self assist.  Physical assist to manage LEs, control trunk and to rotate using pad  Transfers Overall transfer level: Needs assistance Equipment used: Rolling walker (2 wheeled) Transfers: Sit to/from Stand Sit to Stand: Mod assist;+2 safety/equipment;+2 physical assistance;From elevated surface         General transfer comment: stood x 30 seconds and 60 seconds for bed pads to be changed while therapist holding the left foot off floor.   Ambulation/Gait             General Gait Details: Standing at EOB with RW and assist of 2.  Transfer and ambulation  deferred 2* pt difficulty with NWB on R and elevated anxiety level  Stairs            Wheelchair Mobility    Modified Rankin (Stroke Patients Only)       Balance Overall balance assessment: Needs assistance Sitting-balance support: Bilateral upper extremity supported Sitting balance-Leahy Scale: Good     Standing balance support: During functional activity;Bilateral upper extremity supported Standing balance-Leahy Scale: Poor                               Pertinent Vitals/Pain Pain Assessment: Faces Faces Pain Scale: Hurts little more Pain Location: left ankle  Pain Descriptors / Indicators: Discomfort;Sore Pain Intervention(s): Limited activity within patient's tolerance;Monitored during session;Premedicated before session    Home Living Family/patient expects to be discharged to:: Skilled nursing facility Living Arrangements: Alone                    Prior Function Level of Independence: Independent               Hand Dominance        Extremity/Trunk Assessment   Upper Extremity Assessment Upper Extremity Assessment: Generalized weakness    Lower Extremity Assessment Lower Extremity Assessment: LLE deficits/detail;Generalized weakness LLE Deficits / Details: SLC in place    Cervical / Trunk Assessment Cervical / Trunk Assessment: Kyphotic  Communication   Communication: No difficulties  Cognition Arousal/Alertness: Awake/alert Behavior During Therapy: WFL for tasks assessed/performed Overall Cognitive Status: Within Functional Limits  for tasks assessed                                        General Comments      Exercises     Assessment/Plan    PT Assessment Patient needs continued PT services  PT Problem List Pain;Decreased strength;Decreased activity tolerance;Decreased balance;Decreased mobility;Decreased knowledge of use of DME;Decreased safety awareness       PT Treatment Interventions DME  instruction;Functional mobility training;Therapeutic activities;Therapeutic exercise;Patient/family education    PT Goals (Current goals can be found in the Care Plan section)  Acute Rehab PT Goals Patient Stated Goal:  get back home PT Goal Formulation: With patient Time For Goal Achievement: 09/10/17 Potential to Achieve Goals: Fair    Frequency Min 2X/week   Barriers to discharge Decreased caregiver support Home alone    Co-evaluation               AM-PAC PT "6 Clicks" Daily Activity  Outcome Measure Difficulty turning over in bed (including adjusting bedclothes, sheets and blankets)?: Unable Difficulty moving from lying on back to sitting on the side of the bed? : Unable Difficulty sitting down on and standing up from a chair with arms (e.g., wheelchair, bedside commode, etc,.)?: Unable Help needed moving to and from a bed to chair (including a wheelchair)?: A Lot Help needed walking in hospital room?: Total Help needed climbing 3-5 steps with a railing? : Total 6 Click Score: 7    End of Session Equipment Utilized During Treatment: Gait belt Activity Tolerance: Patient limited by fatigue;Patient limited by pain;Other (comment)(anxiety level) Patient left: in bed;with call bell/phone within reach;with nursing/sitter in room Nurse Communication: Mobility status PT Visit Diagnosis: Difficulty in walking, not elsewhere classified (R26.2)    Time: 9379-0240 PT Time Calculation (min) (ACUTE ONLY): 38 min   Charges:   PT Evaluation $PT Eval Low Complexity: 1 Low PT Treatments $Therapeutic Activity: 23-37 mins   PT G Codes:        Pg [phone]   Rebecca Norman 08/29/2017, 12:49 PM

## 2017-08-29 NOTE — Progress Notes (Signed)
ANTICOAGULATION CONSULT NOTE - Follow Up  Pharmacy Consult for IV heparin Indication: atrial fibrillation  Allergies  Allergen Reactions  . Flagyl [Metronidazole] Hives  . Lisinopril Cough    Patient Measurements: Height: 5\' 4"  (162.6 cm) Weight: 168 lb 14 oz (76.6 kg) IBW/kg (Calculated) : 54.7 Heparin Dosing Weight: 70.8  Vital Signs: Temp: 98.4 F (36.9 C) (12/25 0011) Temp Source: Oral (12/25 0011) BP: 132/78 (12/25 0011) Pulse Rate: 72 (12/25 0011)  Labs: Recent Labs    08/26/17 0626 08/27/17 0626 08/27/17 1908 08/27/17 1909 08/28/17 0536 08/28/17 2352  HGB 12.3  --   --  12.3 12.1  --   HCT 38.5  --   --  37.7 37.6  --   PLT 235  --   --  232 199  --   APTT  --  130*  --   --   --  39*  HEPARINUNFRC  --  1.42* 0.87*  --   --  0.29*  CREATININE 0.82  --   --   --  0.72  --     Estimated Creatinine Clearance: 46.9 mL/min (by C-G formula based on SCr of 0.72 mg/dL).   Medical History: Past Medical History:  Diagnosis Date  . DDD (degenerative disc disease), cervical    severe/  auto fusion c2-c5  . Diverticulosis   . Hemorrhoids   . History of adenomatous polyp of colon    1995  adenomatous polyp/  1997 & 2003  hyperplastic polyp's  . History of bladder cancer urologist-  dr [name]   s/p  TURBT's  . History of cancer of ureter   . Hypertension   . Hypothyroid   . Nocturia   . Osteoarthritis   . Osteopenia     Medications:  Scheduled:  . amiodarone  200 mg Oral Daily  . amLODipine  2.5 mg Oral Daily  . feeding supplement (ENSURE ENLIVE)  237 mL Oral BID BM  . levothyroxine  50 mcg Oral QAC breakfast  . metoprolol tartrate  25 mg Oral BID   Infusions:  .  ceFAZolin (ANCEF) IV 2 g (08/28/17 2101)  . heparin      Assessment: 83 yo female presented with fractured ankle on Eliquis PTA for Afib. Last dose of apixaban was 12/21 at 0830 per med history. Patient has been on SQ UFH 5000 units q8 since admission for DVT ppx with last dose around 1400  today. To transition to IV heparin until planned ankle surgery on Monday 12/24.    12/24  Heparin was stopped this AM in anticipation of surgery today  Spoke to Dr. Doreatha Martin who stated that it was ok to resume heparin drip about 6 hours after completion of surgery. Will schedule heparin to start about 1600 today  CBC stable with AM labs   2352 HL=0.29 and aptt=39 below goal.  No infusion or bleeding issues per RN.   Goal of Therapy:  Heparin level 0.3-0.7 units/ml Monitor platelets by anticoagulation protocol: Yes   Plan:   Increase heparin drip to 500 units/hr  Recheck HL in 8 hours  CBC ordered with AM labs    Dorrene German 08/29/2017 12:34 AM

## 2017-08-29 NOTE — Progress Notes (Signed)
Orthopaedic Trauma Progress Note  S: Doing well.  Having no pain the block is still in place.  O:  Vitals:   08/29/17 0410 08/29/17 0803  BP: 127/69 (!) 112/55  Pulse: 69 75  Resp: 15 16  Temp: 98.1 F (36.7 C) 98 F (36.7 C)  SpO2: 95% 95%   Gen:NAD, AAOx3 Left lower extremity: Splint clean dry and intact.  Patient able to wiggle toes slightly.  Continues with diminished sensation in the dorsum and plantar surface of her foot.  Warm well-perfused foot.  Imaging: Postoperative x-rays show a well reduced ankle mortise with no signs of hardware failure.  Labs:  CBC    Component Value Date/Time   WBC 8.0 08/29/2017 0505   RBC 3.54 (L) 08/29/2017 0505   HGB 11.1 (L) 08/29/2017 0505   HCT 34.4 (L) 08/29/2017 0505   PLT 184 08/29/2017 0505   MCV 97.2 08/29/2017 0505   MCH 31.4 08/29/2017 0505   MCHC 32.3 08/29/2017 0505   RDW 13.8 08/29/2017 0505   LYMPHSABS 1.1 08/25/2017 1320   MONOABS 0.9 08/25/2017 1320   EOSABS 0.1 08/25/2017 1320   BASOSABS 0.1 08/25/2017 13268    A/P: 83 year old female postoperative day 1 status post ORIF of left ankle fracture.  -Nonweightbearing left lower extremity -PT OT -Pain control once block wears off -Okay to restart anticoagulation including Eliquis today. -We will determine upon discharge whether to change out splint more return in 2 weeks for suture removal. -Disposition: SNF  Shona Needles, MD [facility] [phone] (phone)

## 2017-08-30 ENCOUNTER — Encounter (HOSPITAL_COMMUNITY): Payer: Self-pay | Admitting: Student

## 2017-08-30 NOTE — Progress Notes (Signed)
PROGRESS NOTE    KORALEE WEDEKING  NFA:[id number] DOB: 1924-11-08 DOA: 08/25/2017 PCP: [name], MD  Brief Narrative: 83 y.o.femalewith medical history significant ofhypertension, hypothyroidism, bladder cancer (s/p ofTURB), DDD, diverticulosis, atrial fibrillation on Eliquis,dCHF, whopresents with fallandleft ankle pain. CT of the left ankle showed oblique fracture of the distal fibular metaphysis with lateral and posterior displacement       Assessment & Plan:   Principal Problem:   Closed left ankle fracture Active Problems:   HTN (hypertension)   Hypothyroid   Atrial fibrillation, chronic (HCC)   Fall   Chronic diastolic CHF (congestive heart failure) (HCC)  Closed Left ankle fracture:  Orthopedics consulted and recommendations given.   status post open reduction and internal fixation of left ankle fracture.  Pain control and PT eval recommending SNF.  Non weight bearing on the left ankle.  ncentive spirometry.    Hypertension well controlled.    Chronic atrial fibrillation  Rate controlled. Resume eliquis.   Rate control with Bb and amiodarone.    Hypothyroidism:  Resume synthroid.    Hypertension:  Well controlled.    Chronic atrial fibrillation:  Rate well controlled. Patient on eliquis, changed to IV heparin.  Rate control with bb and amiodarone.    Closed Left ankle fracture:  Orthopedics consulted and recommendations given.   status post open reduction and internal fixation of left ankle fracture.  Pain control and PT eval recommending SNF.  Non weight bearing on the left ankle.  ncentive spirometry.    Hypertension well controlled.    Chronic atrial fibrillation  Rate controlled. Resume eliquis.   Rate control with Bb and amiodarone.    Hypothyroidism:  Resume synthroid.    Hypertension:  Well controlled.    Chronic atrial fibrillation:  Rate well controlled. Patient on eliquis,  changed to IV heparin.  Rate control with bb and amiodarone.    Constipation- miralax,dulcolax.  DVT prophylaxis: eliquis Code Statusfull Family Communication:none Disposition Plan: snf on d c  Consultants:  ortho  Procedures none Antimicrobials none : Subjective:  Complaints of constipation    Objective:resting in bed in nad. Vitals:   08/29/17 1156 08/29/17 1607 08/29/17 2024 08/30/17 0607  BP: (!) 120/42 (!) 131/58 (!) 130/53 (!) 139/51  Pulse: (!) 52 (!) 56 (!) 58 (!) 59  Resp: 16 16 17 16   Temp: 97.6 F (36.4 C) 97.6 F (36.4 C) 97.7 F (36.5 C) 97.6 F (36.4 C)  TempSrc: Axillary Oral Oral Oral  SpO2: 100% 100% 99% 93%  Weight:      Height:        Intake/Output Summary (Last 24 hours) at 08/30/2017 0801 Last data filed at 08/29/2017 1843 Gross per 24 hour  Intake 720 ml  Output 600 ml  Net 120 ml   Filed Weights   08/25/17 1059 08/25/17 2256  Weight: 77.1 kg (170 lb) 76.6 kg (168 lb 14 oz)    Examination:  General exam: Appears calm and comfortable  Respiratory system: Clear to auscultation. Respiratory effort normal. Cardiovascular system: S1 & S2 heard, RRR. No JVD, murmurs, rubs, gallops or clicks. No pedal edema. Gastrointestinal system: Abdomen is nondistended, soft and nontender. No organomegaly or masses felt. Normal bowel sounds heard. Central nervous system: Alert and oriented. No focal neurological deficits. Extremities: left ankle soft splint on. Skin: No rashes, lesions or ulcers Psychiatry: Judgement and insight appear normal. Mood & affect appropriate.     Data Reviewed: I have personally reviewed following labs and imaging studies  CBC: Recent Labs  Lab 08/25/17 1320 08/26/17 0626 08/27/17 1909 08/28/17 0536 08/29/17 0505  WBC 11.2* 8.7 10.0 6.7 8.0  NEUTROABS 9.1*  --   --   --   --   HGB 14.1 12.3 12.3 12.1 11.1*  HCT 41.4 38.5 37.7 37.6 34.4*  MCV 94.1 95.8 96.2 96.4 97.2  PLT 229 235 232 199 578   Basic  Metabolic Panel: Recent Labs  Lab 08/23/17 1118 08/25/17 1320 08/26/17 0626 08/28/17 0536  NA 138 136 136 135  K 4.7 4.0 3.8 4.1  CL 99 102 102 101  CO2 24 26 28 27   GLUCOSE 94 94 90 95  BUN 17 18 17 16   CREATININE 0.87 0.71 0.82 0.72  CALCIUM 9.4 8.9 8.5* 8.7*   GFR: Estimated Creatinine Clearance: 46.9 mL/min (by C-G formula based on SCr of 0.72 mg/dL). Liver Function Tests: No results for input(s): AST, ALT, ALKPHOS, BILITOT, PROT, ALBUMIN in the last 168 hours. No results for input(s): LIPASE, AMYLASE in the last 168 hours. No results for input(s): AMMONIA in the last 168 hours. Coagulation Profile: Recent Labs  Lab 08/25/17 1912  INR 1.36   Cardiac Enzymes: No results for input(s): CKTOTAL, CKMB, CKMBINDEX, TROPONINI in the last 168 hours. BNP (last 3 results) No results for input(s): PROBNP in the last 8760 hours. HbA1C: No results for input(s): HGBA1C in the last 72 hours. CBG: No results for input(s): GLUCAP in the last 168 hours. Lipid Profile: No results for input(s): CHOL, HDL, LDLCALC, TRIG, CHOLHDL, LDLDIRECT in the last 72 hours. Thyroid Function Tests: No results for input(s): TSH, T4TOTAL, FREET4, T3FREE, THYROIDAB in the last 72 hours. Anemia Panel: No results for input(s): VITAMINB12, FOLATE, FERRITIN, TIBC, IRON, RETICCTPCT in the last 72 hours. Sepsis Labs: No results for input(s): PROCALCITON, LATICACIDVEN in the last 168 hours.  Recent Results (from the past 240 hour(s))  Surgical pcr screen     Status: None   Collection Time: 08/27/17  6:26 PM  Result Value Ref Range Status   MRSA, PCR NEGATIVE NEGATIVE Final   Staphylococcus aureus NEGATIVE NEGATIVE Final    Comment: (NOTE) The Xpert SA Assay (FDA approved for NASAL specimens in patients 95 years of age and older), is one component of a comprehensive surveillance program. It is not intended to diagnose infection nor to guide or monitor treatment.          Radiology Studies: Dg  Ankle Complete Left  Result Date: 08/28/2017 CLINICAL DATA:  Left ankle ORIF EXAM: LEFT ANKLE COMPLETE - 3+ VIEW COMPARISON:  None. FINDINGS: Oblique distal fibular fracture transfixed with a metallic sideplate and interlocking screws. Single syndesmotic screw transfixing the distal tibiofibular joint. Ankle mortise is congruent. Mild tibiotalar joint space narrowing of the lateral aspect of the tibiotalar joint. No other fracture or dislocation. IMPRESSION: 1. Oblique distal fibular fracture transfixed with a metallic sideplate and interlocking screws. Single syndesmotic screw transfixing the distal tibiofibular joint. Ankle mortise is congruent. Electronically Signed   By: Kathreen Devoid   On: 08/28/2017 12:18   Dg C-arm 1-60 Min-no Report  Result Date: 08/28/2017 Fluoroscopy was utilized by the requesting physician.  No radiographic interpretation.        Scheduled Meds: . amiodarone  200 mg Oral Daily  . amLODipine  2.5 mg Oral Daily  . apixaban  5 mg Oral BID  . feeding supplement (ENSURE ENLIVE)  237 mL Oral BID BM  . levothyroxine  50 mcg Oral QAC breakfast  . metoprolol  tartrate  25 mg Oral BID   Continuous Infusions:   LOS: 5 days       Georgette Shell, MD [facility]  If 7PM-7AM, please contact night-coverage [email or web address] Password [email or web address] 08/30/2017, 8:01 AM

## 2017-08-30 NOTE — Progress Notes (Signed)
Occupational Therapy Treatment Patient Details Name: Rebecca Norman MRN: [id number] DOB: 1924/12/03 Today's Date: 08/30/2017    History of present illness LAMAR NAEF is a 83 y.o. female with medical history significant of hypertension, hypothyroidism, bladder cancer (s/p of TURB), DDD, divertisis, atrial fibrillation on Eliquis, dCHF, who presents with fall and left ankle fracture and with ORIF performed 08/28/17.     OT comments  Pt agreeable to OOB!   Follow Up Recommendations  SNF    Equipment Recommendations  Other (comment)    Recommendations for Other Services      Precautions / Restrictions Precautions Precautions: Fall Precaution Comments: NWB LLE Restrictions Weight Bearing Restrictions: Yes LLE Weight Bearing: Non weight bearing       Mobility Bed Mobility Overal bed mobility: Needs Assistance Bed Mobility: Supine to Sit     Supine to sit: Mod assist;HOB elevated        Transfers Overall transfer level: Needs assistance Equipment used: 2 person hand held assist Transfers: Sit to/from Stand Sit to Stand: +2 safety/equipment;+2 physical assistance;From elevated surface;Max assist              Balance Overall balance assessment: Needs assistance Sitting-balance support: Bilateral upper extremity supported Sitting balance-Leahy Scale: Good     Standing balance support: During functional activity;Bilateral upper extremity supported Standing balance-Leahy Scale: Poor                             ADL either performed or assessed with clinical judgement   ADL Overall ADL's : Needs assistance/impaired     Grooming: Sitting;Set up Grooming Details (indicate cue type and reason): sitting in chair                 Toilet Transfer: Maximal assistance;+2 for physical assistance;+2 for safety/equipment;Stand-pivot Toilet Transfer Details (indicate cue type and reason): bed to chair maintaining NWB LLE Toileting- Clothing  Manipulation and Hygiene: Sit to/from stand;+2 for physical assistance;+2 for safety/equipment;Cueing for safety;Cueing for sequencing;Total assistance         General ADL Comments: pt agreeable to get to chair.  CNA Aed OT with one on each side of pt. Performed stand pivot transfer to recliner.       Vision Patient Visual Report: No change from baseline            Cognition Arousal/Alertness: Awake/alert Behavior During Therapy: WFL for tasks assessed/performed Overall Cognitive Status: Within Functional Limits for tasks assessed                                                     Pertinent Vitals/ Pain       Pain Score: 3  Pain Location: left ankle  Pain Descriptors / Indicators: Aching;Discomfort Pain Intervention(s): Limited activity within patient's tolerance;Monitored during session;Repositioned         Frequency  Min 2X/week         AM-PAC PT "6 Clicks" Daily Activity     Outcome Measure   Help from another person eating meals?: A Little Help from another person taking care of personal grooming?: A Little Help from another person toileting, which includes using toliet, bedpan, or urinal?: Total Help from another person bathing (including washing, rinsing, drying)?: A Lot Help from another person to put on and taking off regular  upper body clothing?: Total Help from another person to put on and taking off regular lower body clothing?: Total 6 Click Score: 11    End of Session    OT Visit Diagnosis: Unsteadiness on feet (R26.81);History of falling (Z91.81);Other abnormalities of gait and mobility (R26.89)   Activity Tolerance Patient tolerated treatment well   Patient Left with call bell/phone within reach;in chair   Nurse Communication Mobility status;Weight bearing status        Time: 1430-1505 OT Time Calculation (min): 35 min  Charges: OT General Charges $OT Visit: 1 Visit OT Treatments $Self Care/Home Management :  23-37 mins  [place], [place]   Betsy Pries 08/30/2017, 3:23 PM

## 2017-08-30 NOTE — Progress Notes (Signed)
Date: August 30, 2017 Sora Vrooman, BSN, RN3, CCM [phone] Chart and notes review for patient progress and needs. Will follow for case management and discharge needs. Next review date: [id number] 

## 2017-08-31 NOTE — Progress Notes (Signed)
Orthopaedic Trauma Progress Note  S: Doing well.  Pain controlled  O:  Vitals:   08/31/17 0547 08/31/17 0936  BP: (!) 130/59 (!) 121/45  Pulse: (!) 55 63  Resp: 18   Temp: (!) 97.3 F (36.3 C)   SpO2: 99%    Gen:NAD, AAOx3 Left lower extremity: Splint clean dry and intact.  Patient able to wiggle toes slightly.  Sensation intact to dorsum and plantar surface of her foot.  Warm well-perfused foot.  Labs:  CBC    Component Value Date/Time   WBC 8.0 08/29/2017 0505   RBC 3.54 (L) 08/29/2017 0505   HGB 11.1 (L) 08/29/2017 0505   HCT 34.4 (L) 08/29/2017 0505   PLT 184 08/29/2017 0505   MCV 97.2 08/29/2017 0505   MCH 31.4 08/29/2017 0505   MCHC 32.3 08/29/2017 0505   RDW 13.8 08/29/2017 0505   LYMPHSABS 1.1 08/25/2017 1320   MONOABS 0.9 08/25/2017 1320   EOSABS 0.1 08/25/2017 1320   BASOSABS 0.1 08/25/2017 13242    A/P: 83 year old female postoperative day 3 status post ORIF of left ankle fracture.  -Nonweightbearing left lower extremity -PT OT -Eliquis -Follow up with me in 2 weeks for suture removal and transition to cast -Disposition: SNF  Shona Needles, MD [facility] [phone] (phone)

## 2017-08-31 NOTE — Progress Notes (Signed)
Physical Therapy Treatment Patient Details Name: Rebecca Norman MRN: [id number] DOB: [birth year] Today's Date: 08/31/2017    History of Present Illness Rebecca Norman is a 83 y.o. female with medical history significant of hypertension, hypothyroidism, bladder cancer (s/p of TURB), DDD, divertisis, atrial fibrillation on Eliquis, dCHF, who presents with fall and left ankle fracture and with ORIF performed 08/28/17.      PT Comments    Patient is slowly improving, requires manual assist for NWB on left foot. Plans SNF.   Follow Up Recommendations  SNF     Equipment Recommendations  None recommended by PT    Recommendations for Other Services       Precautions / Restrictions Precautions Precautions: Fall Precaution Comments: NWB LLE Restrictions LLE Weight Bearing: Non weight bearing    Mobility  Bed Mobility Overal bed mobility: Needs Assistance Bed Mobility: Supine to Sit     Supine to sit: Mod assist;HOB elevated     General bed mobility comments: Cues for sequence and use of R LE to self assist.  Physical assist to manage LEs, control trunk and to rotate using pad  Transfers Overall transfer level: Needs assistance   Transfers: Sit to/from Stand;Stand Pivot Transfers Sit to Stand: +2 safety/equipment;+2 physical assistance;From elevated surface;Max assist Stand pivot transfers: Max assist;+2 physical assistance;+2 safety/equipment       General transfer comment: stand  scoot pivot to [facility] the same to recliner. Needs assistance for steady assist and difficulty with hopping. Has to scoot  on left foot while standing at RW/  Ambulation/Gait                 Stairs            Wheelchair Mobility    Modified Rankin (Stroke Patients Only)       Balance                                            Cognition Arousal/Alertness: Awake/alert Behavior During Therapy: WFL for tasks assessed/performed                                           Exercises      General Comments        Pertinent Vitals/Pain Faces Pain Scale: Hurts little more Pain Location: left ankle  Pain Descriptors / Indicators: Throbbing Pain Intervention(s): Monitored during session;Limited activity within patient's tolerance;Patient requesting pain meds-RN notified    Home Living                      Prior Function            PT Goals (current goals can now be found in the care plan section) Progress towards PT goals: Progressing toward goals    Frequency    Min 2X/week      PT Plan Current plan remains appropriate    Co-evaluation              AM-PAC PT "6 Clicks" Daily Activity  Outcome Measure  Difficulty turning over in bed (including adjusting bedclothes, sheets and blankets)?: Unable Difficulty moving from lying on back to sitting on the side of the bed? : Unable Difficulty sitting down on and standing up from a chair with arms (  e.g., wheelchair, bedside commode, etc,.)?: Unable Help needed moving to and from a bed to chair (including a wheelchair)?: A Lot Help needed walking in hospital room?: Total Help needed climbing 3-5 steps with a railing? : Total 6 Click Score: 7    End of Session Equipment Utilized During Treatment: Gait belt Activity Tolerance: Patient tolerated treatment well Patient left: in chair;with call bell/phone within reach;with nursing/sitter in room Nurse Communication: Mobility status PT Visit Diagnosis: Difficulty in walking, not elsewhere classified (R26.2)     Time: 1102-1117 PT Time Calculation (min) (ACUTE ONLY): 23 min  Charges:  $Therapeutic Activity: 23-37 mins                    G CodesTresa Endo PT [phone]    Claretha Cooper 08/31/2017, 1:20 PM

## 2017-08-31 NOTE — Progress Notes (Signed)
PROGRESS NOTE    DESTYNE GOODREAU  XBM:[id number] DOB: 01/01/1925 DOA: 08/25/2017 PCP: [name], MD  Brief Narrative: 83 y.o.femalewith medical history significant ofhypertension, hypothyroidism, bladder cancer (s/p ofTURB), DDD, diverticulosis, atrial fibrillation on Eliquis,dCHF, whopresents with fallandleft ankle pain. CT of the left ankle showed oblique fracture of the distal fibular metaphysis with lateral and posterior displacement  Assessment & Plan:   Principal Problem:   Closed left ankle fracture Active Problems:   HTN (hypertension)   Hypothyroid   Atrial fibrillation, chronic (HCC)   Fall   Chronic diastolic CHF (congestive heart failure) (HCC)  Closed Left ankle fracture:  Orthopedics consulted and recommendations given.   status post open reduction and internal fixation of left ankle fracture. Cleared for DC by Orthopedic team Pursue disposition  Pain control and PT eval recommending SNF.  Non weight bearing on the left ankle.  ncentive spirometry.    Hypertension well controlled.    Chronic atrial fibrillation  Rate controlled. Resume eliquis.   Rate control with Bb and amiodarone.    Hypothyroidism:  Resume synthroid.    Hypertension:  Well controlled.    Chronic atrial fibrillation:  Rate well controlled. Patient on eliquis, changed to IV heparin.  Rate control with bb and amiodarone.    Closed Left ankle fracture:  Orthopedics consulted and recommendations given.   status post open reduction and internal fixation of left ankle fracture.  Pain control and PT eval recommending SNF.  Non weight bearing on the left ankle.  ncentive spirometry.    Hypertension well controlled.    Chronic atrial fibrillation  Rate controlled. Resume eliquis.   Rate control with Bb and amiodarone.    Hypothyroidism:  Resume synthroid.    Hypertension:  Well controlled.    Chronic atrial fibrillation:    Rate well controlled. Patient on eliquis, changed to IV heparin.  Rate control with bb and amiodarone.    Constipation- miralax,dulcolax.  DVT prophylaxis: eliquis Code Statusfull Family Communication:none Disposition Plan: snf on d c  Consultants:  ortho  Procedures none Antimicrobials none : Subjective:  Complaints of constipation    Objective:resting in bed in nad. Vitals:   08/30/17 1408 08/31/17 0547 08/31/17 0936 08/31/17 1530  BP: (!) 127/52 (!) 130/59 (!) 121/45 (!) 114/49  Pulse: 60 (!) 55 63 60  Resp: 18 18    Temp: 98.2 F (36.8 C) (!) 97.3 F (36.3 C)  97.9 F (36.6 C)  TempSrc: Oral Oral  Axillary  SpO2: 99% 99%  91%  Weight:      Height:        Intake/Output Summary (Last 24 hours) at 08/31/2017 1554 Last data filed at 08/31/2017 0800 Gross per 24 hour  Intake 240 ml  Output -  Net 240 ml   Filed Weights   08/25/17 1059 08/25/17 2256  Weight: 77.1 kg (170 lb) 76.6 kg (168 lb 14 oz)    Examination:  General exam: Appears calm and comfortable  Respiratory system: Clear to auscultation. Respiratory effort normal. Cardiovascular system: S1 & S2 heard, RRR. No JVD, murmurs, rubs, gallops or clicks. No pedal edema. Gastrointestinal system: Abdomen is nondistended, soft and nontender. No organomegaly or masses felt. Normal bowel sounds heard. Central nervous system: Alert and oriented. No focal neurological deficits. Extremities: left ankle soft splint on. Skin: No rashes, lesions or ulcers Psychiatry: Judgement and insight appear normal. Mood & affect appropriate.     Data Reviewed: I have personally reviewed following labs and imaging studies  CBC: Recent  Labs  Lab 08/25/17 1320 08/26/17 0626 08/27/17 1909 08/28/17 0536 08/29/17 0505  WBC 11.2* 8.7 10.0 6.7 8.0  NEUTROABS 9.1*  --   --   --   --   HGB 14.1 12.3 12.3 12.1 11.1*  HCT 41.4 38.5 37.7 37.6 34.4*  MCV 94.1 95.8 96.2 96.4 97.2  PLT 229 235 232 199 675   Basic  Metabolic Panel: Recent Labs  Lab 08/25/17 1320 08/26/17 0626 08/28/17 0536  NA 136 136 135  K 4.0 3.8 4.1  CL 102 102 101  CO2 26 28 27   GLUCOSE 94 90 95  BUN 18 17 16   CREATININE 0.71 0.82 0.72  CALCIUM 8.9 8.5* 8.7*   GFR: Estimated Creatinine Clearance: 46.9 mL/min (by C-G formula based on SCr of 0.72 mg/dL). Liver Function Tests: No results for input(s): AST, ALT, ALKPHOS, BILITOT, PROT, ALBUMIN in the last 168 hours. No results for input(s): LIPASE, AMYLASE in the last 168 hours. No results for input(s): AMMONIA in the last 168 hours. Coagulation Profile: Recent Labs  Lab 08/25/17 1912  INR 1.36   Cardiac Enzymes: No results for input(s): CKTOTAL, CKMB, CKMBINDEX, TROPONINI in the last 168 hours. BNP (last 3 results) No results for input(s): PROBNP in the last 8760 hours. HbA1C: No results for input(s): HGBA1C in the last 72 hours. CBG: No results for input(s): GLUCAP in the last 168 hours. Lipid Profile: No results for input(s): CHOL, HDL, LDLCALC, TRIG, CHOLHDL, LDLDIRECT in the last 72 hours. Thyroid Function Tests: No results for input(s): TSH, T4TOTAL, FREET4, T3FREE, THYROIDAB in the last 72 hours. Anemia Panel: No results for input(s): VITAMINB12, FOLATE, FERRITIN, TIBC, IRON, RETICCTPCT in the last 72 hours. Sepsis Labs: No results for input(s): PROCALCITON, LATICACIDVEN in the last 168 hours.  Recent Results (from the past 240 hour(s))  Surgical pcr screen     Status: None   Collection Time: 08/27/17  6:26 PM  Result Value Ref Range Status   MRSA, PCR NEGATIVE NEGATIVE Final   Staphylococcus aureus NEGATIVE NEGATIVE Final    Comment: (NOTE) The Xpert SA Assay (FDA approved for NASAL specimens in patients 42 years of age and older), is one component of a comprehensive surveillance program. It is not intended to diagnose infection nor to guide or monitor treatment.          Radiology Studies: No results found.      Scheduled Meds: .  amiodarone  200 mg Oral Daily  . amLODipine  2.5 mg Oral Daily  . apixaban  5 mg Oral BID  . feeding supplement (ENSURE ENLIVE)  237 mL Oral BID BM  . levothyroxine  50 mcg Oral QAC breakfast  . metoprolol tartrate  25 mg Oral BID   Continuous Infusions:   LOS: 6 days       Bonnell Public, MD [facility]  If 7PM-7AM, please contact night-coverage [email or web address] Password [facility] 08/31/2017, 3:54 PM

## 2017-08-31 NOTE — Discharge Instructions (Signed)
Orthopaedic Trauma Service Discharge Instructions   General Discharge Instructions  WEIGHT BEARING STATUS: Nonweightbearing left leg  RANGE OF MOTION/ACTIVITY: Keep splint in place until follow up  Wound Care:Keep splint in place until follow up  DVT/PE prophylaxis: Eliquis  Diet: as you were eating previously.  Can use over the counter stool softeners and bowel preparations, such as Miralax, to help with bowel movements.  Narcotics can be constipating.  Be sure to drink plenty of fluids  PAIN MEDICATION USE AND EXPECTATIONS  You have likely been given narcotic medications to help control your pain.  After a traumatic event that results in an fracture (broken bone) with or without surgery, it is ok to use narcotic pain medications to help control one's pain.  We understand that everyone responds to pain differently and each individual patient will be evaluated on a regular basis for the continued need for narcotic medications. Ideally, narcotic medication use should last no more than 6-8 weeks (coinciding with fracture healing).   As a patient it is your responsibility as well to monitor narcotic medication use and report the amount and frequency you use these medications when you come to your office visit.   We would also advise that if you are using narcotic medications, you should take a dose prior to therapy to maximize you participation.  IF YOU ARE ON NARCOTIC MEDICATIONS IT IS NOT PERMISSIBLE TO OPERATE A MOTOR VEHICLE (MOTORCYCLE/CAR/TRUCK/MOPED) OR HEAVY MACHINERY DO NOT MIX NARCOTICS WITH OTHER CNS (CENTRAL NERVOUS SYSTEM) DEPRESSANTS SUCH AS ALCOHOL   STOP SMOKING OR USING NICOTINE PRODUCTS!!!!  As discussed nicotine severely impairs your body's ability to heal surgical and traumatic wounds but also impairs bone healing.  Wounds and bone heal by forming microscopic blood vessels (angiogenesis) and nicotine is a vasoconstrictor (essentially, shrinks blood vessels).  Therefore, if  vasoconstriction occurs to these microscopic blood vessels they essentially disappear and are unable to deliver necessary nutrients to the healing tissue.  This is one modifiable factor that you can do to dramatically increase your chances of healing your injury.    (This means no smoking, no nicotine gum, patches, etc)  DO NOT USE NONSTEROIDAL ANTI-INFLAMMATORY DRUGS (NSAID'S)  Using products such as Advil (ibuprofen), Aleve (naproxen), Motrin (ibuprofen) for additional pain control during fracture healing can delay and/or prevent the healing response.  If you would like to take over the counter (OTC) medication, Tylenol (acetaminophen) is ok.  However, some narcotic medications that are given for pain control contain acetaminophen as well. Therefore, you should not exceed more than 4000 mg of tylenol in a day if you do not have liver disease.  Also note that there are may OTC medicines, such as cold medicines and allergy medicines that my contain tylenol as well.  If you have any questions about medications and/or interactions please ask your doctor/PA or your pharmacist.      ICE AND ELEVATE INJURED/OPERATIVE EXTREMITY  Using ice and elevating the injured extremity above your heart can help with swelling and pain control.  Icing in a pulsatile fashion, such as 20 minutes on and 20 minutes off, can be followed.    Do not place ice directly on skin. Make sure there is a barrier between to skin and the ice pack.    Using frozen items such as frozen peas works well as the conform nicely to the are that needs to be iced.  IF YOU ARE IN A SPLINT OR CAST DO NOT REMOVE IT FOR ANY REASON  If your splint gets wet for any reason please contact the office immediately. You may shower in your splint or cast as long as you keep it dry.  This can be done by wrapping in a cast cover or garbage back (or similar)  Do Not stick any thing down your splint or cast such as pencils, money, or hangers to try and scratch  yourself with.  If you feel itchy take benadryl as prescribed on the bottle for itching   CALL THE OFFICE WITH ANY QUESTIONS OR CONCERNS: [phone]

## 2017-09-01 ENCOUNTER — Telehealth: Payer: Self-pay | Admitting: Cardiology

## 2017-09-01 DIAGNOSIS — S82892A Other fracture of left lower leg, initial encounter for closed fracture: Secondary | ICD-10-CM | POA: Diagnosis not present

## 2017-09-01 DIAGNOSIS — R278 Other lack of coordination: Secondary | ICD-10-CM | POA: Diagnosis not present

## 2017-09-01 DIAGNOSIS — M25511 Pain in right shoulder: Secondary | ICD-10-CM | POA: Diagnosis not present

## 2017-09-01 DIAGNOSIS — I10 Essential (primary) hypertension: Secondary | ICD-10-CM | POA: Diagnosis not present

## 2017-09-01 DIAGNOSIS — R110 Nausea: Secondary | ICD-10-CM | POA: Diagnosis not present

## 2017-09-01 DIAGNOSIS — R262 Difficulty in walking, not elsewhere classified: Secondary | ICD-10-CM | POA: Diagnosis not present

## 2017-09-01 DIAGNOSIS — Z9181 History of falling: Secondary | ICD-10-CM | POA: Diagnosis not present

## 2017-09-01 DIAGNOSIS — W19XXXA Unspecified fall, initial encounter: Secondary | ICD-10-CM | POA: Diagnosis not present

## 2017-09-01 DIAGNOSIS — I4891 Unspecified atrial fibrillation: Secondary | ICD-10-CM | POA: Diagnosis not present

## 2017-09-01 DIAGNOSIS — Z4789 Encounter for other orthopedic aftercare: Secondary | ICD-10-CM | POA: Diagnosis not present

## 2017-09-01 DIAGNOSIS — I482 Chronic atrial fibrillation: Secondary | ICD-10-CM | POA: Diagnosis not present

## 2017-09-01 DIAGNOSIS — E039 Hypothyroidism, unspecified: Secondary | ICD-10-CM | POA: Diagnosis not present

## 2017-09-01 DIAGNOSIS — S82892D Other fracture of left lower leg, subsequent encounter for closed fracture with routine healing: Secondary | ICD-10-CM | POA: Diagnosis not present

## 2017-09-01 DIAGNOSIS — R05 Cough: Secondary | ICD-10-CM | POA: Diagnosis not present

## 2017-09-01 DIAGNOSIS — K5900 Constipation, unspecified: Secondary | ICD-10-CM | POA: Diagnosis not present

## 2017-09-01 DIAGNOSIS — I509 Heart failure, unspecified: Secondary | ICD-10-CM | POA: Diagnosis not present

## 2017-09-01 DIAGNOSIS — S82832S Other fracture of upper and lower end of left fibula, sequela: Secondary | ICD-10-CM | POA: Diagnosis not present

## 2017-09-01 DIAGNOSIS — R2689 Other abnormalities of gait and mobility: Secondary | ICD-10-CM | POA: Diagnosis not present

## 2017-09-01 DIAGNOSIS — S8002XA Contusion of left knee, initial encounter: Secondary | ICD-10-CM | POA: Diagnosis not present

## 2017-09-01 DIAGNOSIS — I5032 Chronic diastolic (congestive) heart failure: Secondary | ICD-10-CM | POA: Diagnosis not present

## 2017-09-01 DIAGNOSIS — G8911 Acute pain due to trauma: Secondary | ICD-10-CM | POA: Diagnosis not present

## 2017-09-01 DIAGNOSIS — Z967 Presence of other bone and tendon implants: Secondary | ICD-10-CM | POA: Diagnosis not present

## 2017-09-01 DIAGNOSIS — Y92009 Unspecified place in unspecified non-institutional (private) residence as the place of occurrence of the external cause: Secondary | ICD-10-CM | POA: Diagnosis not present

## 2017-09-01 DIAGNOSIS — M25561 Pain in right knee: Secondary | ICD-10-CM | POA: Diagnosis not present

## 2017-09-01 DIAGNOSIS — M6281 Muscle weakness (generalized): Secondary | ICD-10-CM | POA: Diagnosis not present

## 2017-09-01 DIAGNOSIS — Z5189 Encounter for other specified aftercare: Secondary | ICD-10-CM | POA: Diagnosis not present

## 2017-09-01 DIAGNOSIS — L0390 Cellulitis, unspecified: Secondary | ICD-10-CM | POA: Diagnosis not present

## 2017-09-01 DIAGNOSIS — M79671 Pain in right foot: Secondary | ICD-10-CM | POA: Diagnosis not present

## 2017-09-01 DIAGNOSIS — S8252XS Displaced fracture of medial malleolus of left tibia, sequela: Secondary | ICD-10-CM | POA: Diagnosis not present

## 2017-09-01 DIAGNOSIS — Z8781 Personal history of (healed) traumatic fracture: Secondary | ICD-10-CM | POA: Diagnosis not present

## 2017-09-01 DIAGNOSIS — R41841 Cognitive communication deficit: Secondary | ICD-10-CM | POA: Diagnosis not present

## 2017-09-01 MED ORDER — POLYETHYLENE GLYCOL 3350 17 G PO PACK: 17.0000 g | PACK | Freq: Every day | ORAL | 0 refills | Status: DC | PRN

## 2017-09-01 MED ORDER — OXYCODONE-ACETAMINOPHEN 5-325 MG PO TABS: 1.0000 | ORAL_TABLET | Freq: Four times a day (QID) | ORAL | 0 refills | Status: DC | PRN

## 2017-09-01 NOTE — Progress Notes (Signed)
Nutrition Follow-up  DOCUMENTATION CODES:   Not applicable  INTERVENTION:   Magic cup TID with meals, each supplement provides 290 kcal and 9 grams of protein   NUTRITION DIAGNOSIS:   Increased nutrient needs related to post-op healing as evidenced by estimated needs.  Ongoing  GOAL:   Patient will meet greater than or equal to 90% of their needs  Progressing  MONITOR:   PO intake, Supplement acceptance, Weight trends, Labs, I & O's  REASON FOR ASSESSMENT:   Consult Hip fracture protocol  ASSESSMENT:    83 y.o. female with medical history significant of hypertension, hypothyroidism, bladder cancer (s/p of TURB), DDD, diverticulosis, atrial fibrillation on Eliquis, dCHF, who presents with fall and left ankle pain.   Spoke with pt at bedside who reports appetite is increasing slowly. States she does not like some of the hospital food but is eager to eat. Meal completions charted as 82% average for her last 8 meals. Pt did not eat dinner last night because the baked potato "looked like it was cooked three days ago." Pt not drinking ensure because of the sweetness. Will change to Magic Cup TID, pt amendable. No recent weight has been obtained. Will continue to monitor.   Medications reviewed and include: synthroid Labs reviewed.   Diet Order:  Diet Heart Room service appropriate? Yes; Fluid consistency: Thin  EDUCATION NEEDS:   No education needs have been identified at this time  Skin:  Skin Assessment: Reviewed RN Assessment  Last BM:  09/01/17  Height:   Ht Readings from Last 1 Encounters:  08/25/17 5\' 4"  (1.626 m)    Weight:   Wt Readings from Last 1 Encounters:  08/25/17 168 lb 14 oz (76.6 kg)    Ideal Body Weight:  54.5 kg  BMI:  Body mass index is 28.99 kg/m.  Estimated Nutritional Needs:   Kcal:  1500-1700  Protein:  65-75g  Fluid:  1.7L/day    Mariana Single RD, LDN Clinical Nutrition Pager # [phone]

## 2017-09-01 NOTE — Care Management Important Message (Signed)
Important Message  Patient Details  Name: TASHONDA PINKUS MRN: [id number] Date of Birth: December 10, 1924   Medicare Important Message Given:  Yes    Kerin Salen 09/01/2017, 11:01 AMImportant Message  Patient Details  Name: ASHIMA SHRAKE MRN: [id number] Date of Birth: 10-27-1924   Medicare Important Message Given:  Yes    Kerin Salen 09/01/2017, 11:00 AM

## 2017-09-01 NOTE — Clinical Social Work Placement (Signed)
Patient received and accepted bed offer at [facility]. Facility aware of patient's discharge and confirmed bed offer. [facility] contacted, patient's family notified. Patient's RN can call report to [phone], packet complete. CSW signing off, no other needs identified at this time.   CLINICAL SOCIAL WORK PLACEMENT  NOTE  Date:  09/01/2017  Patient Details  Name: Rebecca Norman MRN: [id number] Date of Birth: Apr 08, 1925  Clinical Social Work is seeking post-discharge placement for this patient at the [place] level of care (*CSW will initial, date and re-position this form in  chart as items are completed):  Yes   Patient/family provided with [place] Work Department's list of facilities offering this level of care within the geographic area requested by the patient (or if unable, by the patient's family).  Yes   Patient/family informed of their freedom to choose among providers that offer the needed level of care, that participate in Medicare, Medicaid or managed care program needed by the patient, have an available bed and are willing to accept the patient.  Yes   Patient/family informed of [facility]'s ownership interest in [facility] and [facility], as well as of the fact that they are under no obligation to receive care at these facilities.  PASRR submitted to EDS on       PASRR number received on 09/01/17     Existing PASRR number confirmed on       FL2 transmitted to all facilities in geographic area requested by pt/family on 09/01/17     FL2 transmitted to all facilities within larger geographic area on       Patient informed that his/her managed care company has contracts with or will negotiate with certain facilities, including the following:        Yes   Patient/family informed of bed offers received.  Patient chooses bed at [facility]     Physician recommends and patient chooses bed at      Patient to be  transferred to [facility] on 09/01/17.  Patient to be transferred to facility by [facility]     Patient family notified on 09/01/17 of transfer.  Name of family member notified:  Marcelyn Bruins     PHYSICIAN       Additional Comment:    _______________________________________________ Burnis Medin, LCSW 09/01/2017, 2:53 PM

## 2017-09-01 NOTE — Progress Notes (Signed)
Called report to [facility]. Gave report to [employer].

## 2017-09-01 NOTE — Progress Notes (Signed)
Date: September 01, 2017 Chart review for discharge needs:  None found for case management. Patient has no questions concerning post hospital care.

## 2017-09-01 NOTE — Discharge Summary (Signed)
Physician Discharge Summary  Rebecca Norman:[id number] DOB: [birth year] DOA: 08/25/2017  PCP: [name], MD  Admit date: 08/25/2017 Discharge date: 09/01/2017  Admitted From: Home Disposition: SNF   Recommendations for Outpatient Follow-up:  1. Follow up with orthopedics, Dr. Doreatha Martin, in 2 weeks for suture removal and transition to cast.  2. NWB LLE 3. Please obtain BMP/CBC in one week  Home Health: Per SNF Equipment/Devices: Per SNF Discharge Condition: Stable CODE STATUS: Full Diet recommendation: Heart healthy  Brief/Interim Summary: Rebecca Norman is a 83 y.o.femalewith medical history significant ofhypertension, hypothyroidism, bladder cancer (s/p ofTURB), DDD, diverticulosis, atrial fibrillation on Eliquis,and chronic  diastolic CHF whopresented with fallandleft ankle pain. CT of the left ankle showed oblique fracture of the distal fibular metaphysis with lateral and posterior displacement. ORIF was performed 95/62 with uncomplicated postoperative course. SNF is recommended for discharge rehabilitation.   Discharge Diagnoses:  Principal Problem:   Closed left ankle fracture Active Problems:   HTN (hypertension)   Hypothyroid   Atrial fibrillation, chronic (HCC)   Fall   Chronic diastolic CHF (congestive heart failure) (HCC)  Closed left ankle fracture: s/p ORIF 12/24 by Dr. Doreatha Martin.  - NWB LLE for now - Continue eliquis as below - Pain control per orthopedics. Bowel regimen as ordered. - Follow up with Dr. Doreatha Martin in 2 weeks.  - Continue therapy at SNF  Chronic diastolic CHF: Euvolemic.  - Continue home medications including daily lasix.  HTN: Chronic, stable.  - Continue home medications  Chronic atrial fibrillation: Currently NSR.  - Continue eliquis, beta blocker, and amiodarone  Hypothyroidism: TSH 2.929. - Resume synthroid.  Discharge Instructions Discharge Instructions    Diet - low sodium heart healthy   Complete by:  As  directed      Allergies as of 09/01/2017      Reactions   Flagyl [metronidazole] Hives   Lisinopril Cough      Medication List    TAKE these medications   acetaminophen 500 MG tablet Commonly known as:  TYLENOL Take 500 mg by mouth every 6 (six) hours as needed for headache (pain).   amiodarone 200 MG tablet Commonly known as:  PACERONE Take 1 tablet (200 mg total) by mouth daily.   amLODipine 2.5 MG tablet Commonly known as:  NORVASC Take 1 tablet (2.5 mg total) by mouth daily.   apixaban 5 MG Tabs tablet Commonly known as:  ELIQUIS Take 1 tablet (5 mg total) by mouth 2 (two) times daily.   ARTIFICIAL TEARS 1.4 % ophthalmic solution Generic drug:  polyvinyl alcohol Place 1 drop into both eyes daily as needed for dry eyes (itching).   furosemide 20 MG tablet Commonly known as:  LASIX Take 1 tablet (20 mg total) by mouth daily.   levothyroxine 50 MCG tablet Commonly known as:  SYNTHROID, LEVOTHROID Take 50 mcg by mouth daily before breakfast.   metoprolol tartrate 25 MG tablet Commonly known as:  LOPRESSOR Take 1 tablet (25 mg total) by mouth 2 (two) times daily.   oxyCODONE-acetaminophen 5-325 MG tablet Commonly known as:  PERCOCET/ROXICET Take 1 tablet by mouth every 6 (six) hours as needed for moderate pain or severe pain.   polyethylene glycol packet Commonly known as:  MIRALAX / GLYCOLAX Take 17 g by mouth daily as needed for mild constipation.   sodium chloride 0.65 % Soln nasal spray Commonly known as:  OCEAN Place 1 spray into both nostrils daily as needed for congestion.      Follow-up Information  Haddix, Thomasene Lot, MD. Schedule an appointment as soon as possible for a visit in 2 week(s).   Specialty:  Orthopedic Surgery Contact information: [address] [place] [address] [phone]        [name], MD Follow up.   Specialty:  Family Medicine Contact information: [address] [place]  [address] [phone]          Allergies  Allergen Reactions  . Flagyl [Metronidazole] Hives  . Lisinopril Cough    Consultations:  Orthopedic surgery  Procedures/Studies: Dg Chest 2 View  Result Date: 08/06/2017 CLINICAL DATA:  Shortness breath and cough. EXAM: CHEST  2 VIEW COMPARISON:  Chest x-ray dated June 29, 2010. FINDINGS: Moderate cardiomegaly, similar to prior study. Diffusely increased interstitial markings. Atherosclerotic calcification of the aortic arch. Small right pleural effusion. Bibasilar atelectasis. No pneumothorax. No acute osseous abnormality. IMPRESSION: Moderate cardiomegaly with pulmonary interstitial edema and small right pleural effusion. Electronically Signed   By: Titus Dubin M.D.   On: 08/06/2017 12:11   Dg Ankle Complete Left  Result Date: 08/28/2017 CLINICAL DATA:  Left ankle ORIF EXAM: LEFT ANKLE COMPLETE - 3+ VIEW COMPARISON:  None. FINDINGS: Oblique distal fibular fracture transfixed with a metallic sideplate and interlocking screws. Single syndesmotic screw transfixing the distal tibiofibular joint. Ankle mortise is congruent. Mild tibiotalar joint space narrowing of the lateral aspect of the tibiotalar joint. No other fracture or dislocation. IMPRESSION: 1. Oblique distal fibular fracture transfixed with a metallic sideplate and interlocking screws. Single syndesmotic screw transfixing the distal tibiofibular joint. Ankle mortise is congruent. Electronically Signed   By: Kathreen Devoid   On: 08/28/2017 12:18   Dg Ankle Complete Left  Result Date: 08/25/2017 CLINICAL DATA:  83 year old female with distal fibular fracture. EXAM: LEFT ANKLE COMPLETE - 3+ VIEW COMPARISON:  CT dated 08/25/2017 FINDINGS: Mildly displaced oblique fracture of the distal fibula as seen on the prior CT. There is associated approximately 4 mm lateral subluxation of the ankle mortise. The bones are osteopenic. Degenerative changes of the tarsometatarsal joints. There has  been interval placement of a cast. There is soft tissue swelling and edema. IMPRESSION: Mildly displaced fracture of the distal fibula and mild lateral subluxation of the ankle mortise status post cast placement. Electronically Signed   By: Anner Crete M.D.   On: 08/25/2017 20:36   Dg Ankle Complete Left  Result Date: 08/25/2017 CLINICAL DATA:  [place] today at home going to a bathroom; twisted left ankle; pain in lateral side of ankle and radiates to left foot; hx fx about 30 years ago per pt EXAM: LEFT ANKLE COMPLETE - 3+ VIEW COMPARISON:  None. FINDINGS: There is an oblique fracture of the distal fibula extending from the lateral distal diaphysis to the medial metaphysis. The distal fracture component is displaced laterally by 6 mm. There are multiple small bone fragments between the medial malleolus and medial talus consistent with small avulsion fractures. The talus has subluxed laterally by 6 mm. No other fractures. There is diffuse surrounding soft tissue swelling. IMPRESSION: 1. Mildly displaced distal fibular fracture. Multiple small avulsion fractures between the medial malleolus and talus, likely arising from the talus, consistent with avulsions at the deep deltoid ligament insertion. Talus is subluxed laterally by 6 mm. Electronically Signed   By: Lajean Manes M.D.   On: 08/25/2017 12:23   Ct Head Wo Contrast  Result Date: 08/25/2017 CLINICAL DATA:  Fall with head trauma.  Ataxia. EXAM: CT HEAD WITHOUT  CONTRAST CT CERVICAL SPINE WITHOUT CONTRAST TECHNIQUE: Multidetector CT imaging of the head and cervical spine was performed following the standard protocol without intravenous contrast. Multiplanar CT image reconstructions of the cervical spine were also generated. COMPARISON:  None. FINDINGS: CT HEAD FINDINGS Brain: No evidence of acute infarction, hemorrhage, hydrocephalus, extra-axial collection or mass lesion/mass effect. 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 Cortical/sulcal  calcification left posterior frontal, nonspecific and incidental in isolation. Generalized volume loss. Vascular: Atherosclerotic calcification.  No hyperdense vessel. Skull: Negative for fracture Sinuses/Orbits: Bilateral cataract resection. No evidence of injury. CT CERVICAL SPINE FINDINGS Alignment: No traumatic malalignment. Degenerative facet mediated C7-T1 mild anterolisthesis. Skull base and vertebrae: Negative for fracture. Soft tissues and spinal canal: No gross canal hematoma or prevertebral edema. Retropharyngeal course of the carotids incidentally noted. Asymmetric fatty atrophy of the left parotid. Disc levels: Facet ankylosis on the right from C2-C5. Left-sided facet ankylosis C2 -C4. Disc degeneration with advanced height loss and spurring at C5-6 and C6-7. Asymmetric left facet arthropathy at C7-T1 with anterolisthesis and foraminal narrowing. Upper chest: Pleural effusions and septal lines consistent with edema. There is superimposed emphysematous markings. IMPRESSION: 1. Pulmonary edema and pleural effusions. 2. No evidence of acute intracranial or cervical spine injury. Electronically Signed   By: Monte Fantasia M.D.   On: 08/25/2017 16:32   Ct Cervical Spine Wo Contrast  Result Date: 08/25/2017 CLINICAL DATA:  Fall with head trauma.  Ataxia. EXAM: CT HEAD WITHOUT CONTRAST CT CERVICAL SPINE WITHOUT CONTRAST TECHNIQUE: Multidetector CT imaging of the head and cervical spine was performed following the standard protocol without intravenous contrast. Multiplanar CT image reconstructions of the cervical spine were also generated. COMPARISON:  None. FINDINGS: CT HEAD FINDINGS Brain: No evidence of acute infarction, hemorrhage, hydrocephalus, extra-axial collection or mass lesion/mass effect. 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 Cortical/sulcal calcification left posterior frontal, nonspecific and incidental in isolation. Generalized volume loss. Vascular: Atherosclerotic  calcification.  No hyperdense vessel. Skull: Negative for fracture Sinuses/Orbits: Bilateral cataract resection. No evidence of injury. CT CERVICAL SPINE FINDINGS Alignment: No traumatic malalignment. Degenerative facet mediated C7-T1 mild anterolisthesis. Skull base and vertebrae: Negative for fracture. Soft tissues and spinal canal: No gross canal hematoma or prevertebral edema. Retropharyngeal course of the carotids incidentally noted. Asymmetric fatty atrophy of the left parotid. Disc levels: Facet ankylosis on the right from C2-C5. Left-sided facet ankylosis C2 -C4. Disc degeneration with advanced height loss and spurring at C5-6 and C6-7. Asymmetric left facet arthropathy at C7-T1 with anterolisthesis and foraminal narrowing. Upper chest: Pleural effusions and septal lines consistent with edema. There is superimposed emphysematous markings. IMPRESSION: 1. Pulmonary edema and pleural effusions. 2. No evidence of acute intracranial or cervical spine injury. Electronically Signed   By: Monte Fantasia M.D.   On: 08/25/2017 16:32   Ct Ankle Left Wo Contrast  Result Date: 08/25/2017 CLINICAL DATA:  Left ankle fracture. EXAM: CT OF THE LEFT ANKLE WITHOUT CONTRAST TECHNIQUE: Multidetector CT imaging of the left ankle was performed according to the standard protocol. Multiplanar CT image reconstructions were also generated. COMPARISON:  None. FINDINGS: Bones/Joint/Cartilage Oblique fracture of the distal fibular metaphysis with 6 mm of lateral displacement and 4 mm of posterior displacement. Sliver of bone along the lateral margin of the medial malleolus most consistent with an avulsion fracture with widening of the joint space of the medial malleolus-talus. Tiny fracture of the posterior malleolus of the tibia. Moderate joint space narrowing of the tibiotalar joint most consistent with osteoarthritis. No  other fracture or dislocation. Generalized osteopenia. Subtalar joints are normal. Moderate osteoarthritis of  the second and third tarsometatarsal joints. Mild osteoarthritis of the fourth tarsometatarsal joint. Small joint effusion. Ligaments Ligaments are suboptimally evaluated by CT. Muscles and Tendons Muscles are normal. No muscle atrophy. Flexor, extensor, peroneal and Achilles tendons are grossly intact. Soft tissue No fluid collection or hematoma.  No soft tissue mass. IMPRESSION: 1. Oblique fracture of the distal fibular metaphysis with 6 mm of lateral displacement and 4 mm of posterior displacement. 2. Sliver of bone along the lateral margin of the medial malleolus most consistent with an avulsion fracture with widening of the joint space of the medial malleolus-talus. Tiny fracture of the posterior malleolus of the tibia. Moderate joint space narrowing of the tibiotalar joint most consistent with osteoarthritis. Electronically Signed   By: Kathreen Devoid   On: 08/25/2017 13:45   Dg Foot Complete Left  Result Date: 08/25/2017 CLINICAL DATA:  [place] today at home going to a bathroom; twisted left ankle; pain in lateral side of ankle and radiates to left foot; hx fx about 30 years ago per pt EXAM: LEFT FOOT - COMPLETE 3+ VIEW COMPARISON:  None. FINDINGS: The fracture of the distal fibula and avulsion fractures from the medial talus are again noted as is lateral talar subluxation. There are no foot fractures. There is a marked hallux valgus deformity. No dislocation. Degenerative spurring is noted at the cuneiform metatarsal articulation on the lateral view. Bones are demineralized. IMPRESSION: 1. No foot fracture or dislocation. Electronically Signed   By: Lajean Manes M.D.   On: 08/25/2017 12:24   Dg C-arm 1-60 Min-no Report  Result Date: 08/28/2017 Fluoroscopy was utilized by the requesting physician.  No radiographic interpretation.   Subjective: Pain controlled, depressed about having to be in bed especially around the holidays. Has family to talk to about this. No chest pain, dyspnea, fever, or other  complaints.   Discharge Exam: Vitals:   09/01/17 0602 09/01/17 1105  BP: (!) 152/59 (!) 134/54  Pulse: 61 61  Resp: 16   Temp: 98 F (36.7 C)   SpO2: 97%    General: Pt is alert, awake, not in acute distress Cardiovascular: RRR, S1/S2 +, no rubs, no gallops Respiratory: CTA bilaterally, no wheezing, no rhonchi Abdominal: Soft, NT, ND, bowel sounds + Extremities: No edema. LLE with ACE wrap in lower leg/ankle. Strength in toes 5/5, sensation intact throughout, cap refill brisk.   Labs: BNP (last 3 results) Recent Labs    08/06/17 1500 08/25/17 1912  BNP 1,080.8* 9,622.2*   Basic Metabolic Panel: Recent Labs  Lab 08/26/17 0626 08/28/17 0536  NA 136 135  K 3.8 4.1  CL 102 101  CO2 28 27  GLUCOSE 90 95  BUN 17 16  CREATININE 0.82 0.72  CALCIUM 8.5* 8.7*   CBC: Recent Labs  Lab 08/26/17 0626 08/27/17 1909 08/28/17 0536 08/29/17 0505  WBC 8.7 10.0 6.7 8.0  HGB 12.3 12.3 12.1 11.1*  HCT 38.5 37.7 37.6 34.4*  MCV 95.8 96.2 96.4 97.2  PLT 235 232 199 184   Microbiology Recent Results (from the past 240 hour(s))  Surgical pcr screen     Status: None   Collection Time: 08/27/17  6:26 PM  Result Value Ref Range Status   MRSA, PCR NEGATIVE NEGATIVE Final   Staphylococcus aureus NEGATIVE NEGATIVE Final    Comment: (NOTE) The Xpert SA Assay (FDA approved for NASAL specimens in patients 54 years of age and older), is  one component of a comprehensive surveillance program. It is not intended to diagnose infection nor to guide or monitor treatment.     Time coordinating discharge: Approximately 40 minutes  Vance Gather, MD  [facility] 09/01/2017, 1:30 PM Pager [phone]

## 2017-09-01 NOTE — NC FL2 (Signed)
[place] LEVEL OF CARE SCREENING TOOL     IDENTIFICATION  Patient Name: Rebecca Norman Birthdate: [birth year] Sex: female Admission Date (Current Location): 08/25/2017  [facility] [place] Number:  Herbalist and Address:  [facility],  [place], [place]      Provider Number: [id number]  Attending Physician Name and Address:  Patrecia Pour, MD  Relative Name and Phone Number:       Current Level of Care: Hospital Recommended Level of Care: [place] Prior Approval Number:    Date Approved/Denied: 09/01/17 PASRR Number: [id number]  Discharge Plan: SNF    Current Diagnoses: Patient Active Problem List   Diagnosis Date Noted  . Fall 08/25/2017  . Closed left ankle fracture 08/25/2017  . Chronic diastolic CHF (congestive heart failure) ([place]) 08/25/2017  . Closed avulsion fracture of medial malleolus of left tibia   . Fall at home   . Current use of long term anticoagulation 08/11/2017  . Atrial fibrillation, chronic ([place]) 08/06/2017  . Hypothyroid 06/13/2017  . Palpitations 06/13/2017  . Abnormal EKG 06/13/2017  . Heart murmur 06/13/2017  . S/P laminectomy 07/10/2013  . HTN (hypertension) 07/10/2013  . BLADDER CANCER 12/17/2008  . DIVERTICULOSIS OF COLON 12/12/2008  . COLONIC POLYPS, HYPERPLASTIC, HX OF 12/12/2008    Orientation RESPIRATION BLADDER Height & Weight     Self, Time, Situation, Place  O2 External catheter Weight: 168 lb 14 oz (76.6 kg) Height:  5\' 4"  (162.6 cm)  BEHAVIORAL SYMPTOMS/MOOD NEUROLOGICAL BOWEL NUTRITION STATUS      Continent Diet(See dc summary)  AMBULATORY STATUS COMMUNICATION OF NEEDS Skin   Extensive Assist Verbally Surgical wounds(left leg)                       Personal Care Assistance Level of Assistance  Bathing, Dressing Bathing Assistance: Limited assistance Feeding assistance: Independent Dressing Assistance: Limited assistance     Functional  Limitations Info  Hearing, Speech, Sight Sight Info: Adequate Hearing Info: Adequate Speech Info: Adequate    SPECIAL CARE FACTORS FREQUENCY  PT (By licensed PT), OT (By licensed OT)     PT Frequency: 5x/week OT Frequency: 5x/week            Contractures Contractures Info: Not present    Additional Factors Info  Code Status, Allergies Code Status Info: Full Allergies Info:  Flagyl Metronidazole, Lisinopril           Current Medications (09/01/2017):  This is the current hospital active medication list Current Facility-Administered Medications  Medication Dose Route Frequency Provider Last Rate Last Dose  . acetaminophen (TYLENOL) tablet 500 mg  500 mg Oral Q6H PRN Ivor Costa, MD   500 mg at 08/28/17 1559  . amiodarone (PACERONE) tablet 200 mg  200 mg Oral Daily Ivor Costa, MD   200 mg at 09/01/17 1104  . amLODipine (NORVASC) tablet 2.5 mg  2.5 mg Oral Daily Ivor Costa, MD   2.5 mg at 09/01/17 1105  . apixaban (ELIQUIS) tablet 5 mg  5 mg Oral BID Hosie Poisson, MD   5 mg at 09/01/17 1105  . hydrALAZINE (APRESOLINE) injection 5 mg  5 mg Intravenous Q2H PRN Ivor Costa, MD      . levothyroxine (SYNTHROID, LEVOTHROID) tablet 50 mcg  50 mcg Oral QAC breakfast Ivor Costa, MD   50 mcg at 09/01/17 1105  . methocarbamol (ROBAXIN) tablet 500 mg  500 mg Oral Q8H PRN Ivor Costa,  MD   500 mg at 08/27/17 1327  . metoprolol tartrate (LOPRESSOR) tablet 25 mg  25 mg Oral BID Ivor Costa, MD   25 mg at 09/01/17 1105  . morphine 4 MG/ML injection 0.52 mg  0.52 mg Intravenous Q3H PRN Ivor Costa, MD   0.52 mg at 08/29/17 2117  . ondansetron (ZOFRAN) injection 4 mg  4 mg Intravenous Q8H PRN Ivor Costa, MD      . oxyCODONE-acetaminophen (PERCOCET/ROXICET) 5-325 MG per tablet 1 tablet  1 tablet Oral Q4H PRN Ivor Costa, MD   1 tablet at 08/31/17 2022  . polyethylene glycol (MIRALAX / GLYCOLAX) packet 17 g  17 g Oral Daily PRN Ivor Costa, MD   17 g at 08/30/17 1128  . polyvinyl alcohol (LIQUIFILM  TEARS) 1.4 % ophthalmic solution 1 drop  1 drop Both Eyes Daily PRN Ivor Costa, MD      . sodium chloride (OCEAN) 0.65 % nasal spray 1 spray  1 spray Each Nare Daily PRN Ivor Costa, MD      . zolpidem (AMBIEN) tablet 5 mg  5 mg Oral QHS PRN Ivor Costa, MD         Discharge Medications: Please see discharge summary for a list of discharge medications.  Relevant Imaging Results:  Relevant Lab Results:   Additional Information ssn: [id number]  Servando Snare, LCSW

## 2017-09-01 NOTE — Clinical Social Work Note (Signed)
Clinical Social Work Assessment  Patient Details  Name: Rebecca Norman MRN: [id number] Date of Birth: 1925-08-16  Date of referral:  09/01/17               Reason for consult:  Discharge Planning, Facility Placement                Permission sought to share information with:  Case Manager, Customer service manager, Family Supports Permission granted to share information::  Yes, Verbal Permission Granted  Name::     [name] and Surveyor, minerals::  SNF  Relationship::  Daughter and Son  Sport and exercise psychologist Information:     Housing/Transportation Living arrangements for the past 2 months:  [place] of Information:  Patient, Adult Children Patient Interpreter Needed:  None Criminal Activity/Legal Involvement Pertinent to Current Situation/Hospitalization:  No - Comment as needed Significant Relationships:  Adult Children Lives with:  Self Do you feel safe going back to the place where you live?  Yes Need for family participation in patient care:  Yes (Comment)  Care giving concerns:  No care giving concerns at the time of assessment.    Social Worker assessment / plan:  LCSW following for SNF placement.   Patient is from home.   Patient admitted for Closed left ankle fracture.  LCSW met with patient at bedside. No family present.  Patient reports that she lives alone and was independent in her ADLs. She reports that she would use a walker to ambulate at times.   LCSW spoke with patients son by phone. Son reports that prior to fall that patient was independent and driving.   Patient and family agreeable to SNF.   PLAN: Patient will go to SNF at DC.   Employment status:  Retired Forensic scientist:  Medicare PT Recommendations:  [place] / Referral to community resources:  [place]  Patient/Family's Response to care:  Patient and family thankful for CSW visit and services.   Patient/Family's Understanding of and  Emotional Response to Diagnosis, Current Treatment, and Prognosis:  Patient and family are understanding of diagnosis and agreeable to treatment plan.   Emotional Assessment Appearance:  Appears younger than stated age Attitude/Demeanor/Rapport:    Affect (typically observed):    Orientation:  Oriented to Self, Oriented to Place, Oriented to  Time, Oriented to Situation Alcohol / Substance use:  Not Applicable Psych involvement (Current and /or in the community):  No (Comment)  Discharge Needs  Concerns to be addressed:  No discharge needs identified Readmission within the last 30 days:  Yes Current discharge risk:  Lives alone Barriers to Discharge:  No Barriers Identified   Servando Snare, LCSW 09/01/2017, 11:54 AM

## 2017-09-04 DIAGNOSIS — Z9181 History of falling: Secondary | ICD-10-CM | POA: Diagnosis not present

## 2017-09-04 DIAGNOSIS — Z967 Presence of other bone and tendon implants: Secondary | ICD-10-CM | POA: Diagnosis not present

## 2017-09-04 DIAGNOSIS — Z8781 Personal history of (healed) traumatic fracture: Secondary | ICD-10-CM | POA: Diagnosis not present

## 2017-09-04 DIAGNOSIS — S82892A Other fracture of left lower leg, initial encounter for closed fracture: Secondary | ICD-10-CM | POA: Diagnosis not present

## 2017-09-06 DIAGNOSIS — M25511 Pain in right shoulder: Secondary | ICD-10-CM | POA: Diagnosis not present

## 2017-09-06 DIAGNOSIS — M25561 Pain in right knee: Secondary | ICD-10-CM | POA: Diagnosis not present

## 2017-09-06 DIAGNOSIS — Z5189 Encounter for other specified aftercare: Secondary | ICD-10-CM | POA: Diagnosis not present

## 2017-09-06 DIAGNOSIS — R262 Difficulty in walking, not elsewhere classified: Secondary | ICD-10-CM | POA: Diagnosis not present

## 2017-09-06 DIAGNOSIS — K5900 Constipation, unspecified: Secondary | ICD-10-CM | POA: Diagnosis not present

## 2017-09-08 DIAGNOSIS — K5900 Constipation, unspecified: Secondary | ICD-10-CM | POA: Diagnosis not present

## 2017-09-08 DIAGNOSIS — M25561 Pain in right knee: Secondary | ICD-10-CM | POA: Diagnosis not present

## 2017-09-08 DIAGNOSIS — M25511 Pain in right shoulder: Secondary | ICD-10-CM | POA: Diagnosis not present

## 2017-09-08 DIAGNOSIS — R262 Difficulty in walking, not elsewhere classified: Secondary | ICD-10-CM | POA: Diagnosis not present

## 2017-09-08 DIAGNOSIS — Z5189 Encounter for other specified aftercare: Secondary | ICD-10-CM | POA: Diagnosis not present

## 2017-09-08 NOTE — Telephone Encounter (Signed)
New message      Pt c/o medication issue:  1. Name of Medication:  eliquis  2. How are you currently taking this medication (dosage and times per day)?  2x a day   3. Are you having a reaction (difficulty breathing--STAT)? no  4. What is your medication issue? Her insurance changed and she needs preauthorization from [facility] to cover this medication or they will not fill it

## 2017-09-08 NOTE — Telephone Encounter (Signed)
Tried to do PA through cover my meds and Christella Scheuermann can not find the patient . Left message for pt to call

## 2017-09-11 DIAGNOSIS — K5900 Constipation, unspecified: Secondary | ICD-10-CM | POA: Diagnosis not present

## 2017-09-11 DIAGNOSIS — R262 Difficulty in walking, not elsewhere classified: Secondary | ICD-10-CM | POA: Diagnosis not present

## 2017-09-11 DIAGNOSIS — Z5189 Encounter for other specified aftercare: Secondary | ICD-10-CM | POA: Diagnosis not present

## 2017-09-11 DIAGNOSIS — M25561 Pain in right knee: Secondary | ICD-10-CM | POA: Diagnosis not present

## 2017-09-11 DIAGNOSIS — M25511 Pain in right shoulder: Secondary | ICD-10-CM | POA: Diagnosis not present

## 2017-09-12 DIAGNOSIS — S82892D Other fracture of left lower leg, subsequent encounter for closed fracture with routine healing: Secondary | ICD-10-CM | POA: Diagnosis not present

## 2017-09-12 NOTE — Telephone Encounter (Signed)
Left message to call back - need more information for prior authorization of eliquis

## 2017-09-13 DIAGNOSIS — R262 Difficulty in walking, not elsewhere classified: Secondary | ICD-10-CM | POA: Diagnosis not present

## 2017-09-13 DIAGNOSIS — M25511 Pain in right shoulder: Secondary | ICD-10-CM | POA: Diagnosis not present

## 2017-09-13 DIAGNOSIS — K5900 Constipation, unspecified: Secondary | ICD-10-CM | POA: Diagnosis not present

## 2017-09-13 DIAGNOSIS — M25561 Pain in right knee: Secondary | ICD-10-CM | POA: Diagnosis not present

## 2017-09-13 DIAGNOSIS — Z5189 Encounter for other specified aftercare: Secondary | ICD-10-CM | POA: Diagnosis not present

## 2017-09-14 DIAGNOSIS — R262 Difficulty in walking, not elsewhere classified: Secondary | ICD-10-CM | POA: Diagnosis not present

## 2017-09-14 DIAGNOSIS — Z5189 Encounter for other specified aftercare: Secondary | ICD-10-CM | POA: Diagnosis not present

## 2017-09-14 DIAGNOSIS — M25511 Pain in right shoulder: Secondary | ICD-10-CM | POA: Diagnosis not present

## 2017-09-14 DIAGNOSIS — M25561 Pain in right knee: Secondary | ICD-10-CM | POA: Diagnosis not present

## 2017-09-14 DIAGNOSIS — K5900 Constipation, unspecified: Secondary | ICD-10-CM | POA: Diagnosis not present

## 2017-09-15 DIAGNOSIS — M25511 Pain in right shoulder: Secondary | ICD-10-CM | POA: Diagnosis not present

## 2017-09-15 DIAGNOSIS — M25561 Pain in right knee: Secondary | ICD-10-CM | POA: Diagnosis not present

## 2017-09-15 DIAGNOSIS — R262 Difficulty in walking, not elsewhere classified: Secondary | ICD-10-CM | POA: Diagnosis not present

## 2017-09-15 DIAGNOSIS — Z5189 Encounter for other specified aftercare: Secondary | ICD-10-CM | POA: Diagnosis not present

## 2017-09-15 DIAGNOSIS — K5900 Constipation, unspecified: Secondary | ICD-10-CM | POA: Diagnosis not present

## 2017-09-18 DIAGNOSIS — K5900 Constipation, unspecified: Secondary | ICD-10-CM | POA: Diagnosis not present

## 2017-09-18 DIAGNOSIS — R262 Difficulty in walking, not elsewhere classified: Secondary | ICD-10-CM | POA: Diagnosis not present

## 2017-09-18 DIAGNOSIS — Z5189 Encounter for other specified aftercare: Secondary | ICD-10-CM | POA: Diagnosis not present

## 2017-09-18 DIAGNOSIS — M25511 Pain in right shoulder: Secondary | ICD-10-CM | POA: Diagnosis not present

## 2017-09-18 DIAGNOSIS — M25561 Pain in right knee: Secondary | ICD-10-CM | POA: Diagnosis not present

## 2017-09-20 DIAGNOSIS — I4891 Unspecified atrial fibrillation: Secondary | ICD-10-CM | POA: Diagnosis not present

## 2017-09-20 DIAGNOSIS — I10 Essential (primary) hypertension: Secondary | ICD-10-CM | POA: Diagnosis not present

## 2017-09-21 DIAGNOSIS — M25511 Pain in right shoulder: Secondary | ICD-10-CM | POA: Diagnosis not present

## 2017-09-21 DIAGNOSIS — K5900 Constipation, unspecified: Secondary | ICD-10-CM | POA: Diagnosis not present

## 2017-09-21 DIAGNOSIS — Z5189 Encounter for other specified aftercare: Secondary | ICD-10-CM | POA: Diagnosis not present

## 2017-09-21 DIAGNOSIS — M79671 Pain in right foot: Secondary | ICD-10-CM | POA: Diagnosis not present

## 2017-09-21 DIAGNOSIS — M25561 Pain in right knee: Secondary | ICD-10-CM | POA: Diagnosis not present

## 2017-09-21 DIAGNOSIS — R262 Difficulty in walking, not elsewhere classified: Secondary | ICD-10-CM | POA: Diagnosis not present

## 2017-09-22 DIAGNOSIS — M25561 Pain in right knee: Secondary | ICD-10-CM | POA: Diagnosis not present

## 2017-09-22 DIAGNOSIS — M25511 Pain in right shoulder: Secondary | ICD-10-CM | POA: Diagnosis not present

## 2017-09-22 DIAGNOSIS — Z5189 Encounter for other specified aftercare: Secondary | ICD-10-CM | POA: Diagnosis not present

## 2017-09-22 DIAGNOSIS — M79671 Pain in right foot: Secondary | ICD-10-CM | POA: Diagnosis not present

## 2017-09-22 DIAGNOSIS — R262 Difficulty in walking, not elsewhere classified: Secondary | ICD-10-CM | POA: Diagnosis not present

## 2017-09-22 DIAGNOSIS — K5900 Constipation, unspecified: Secondary | ICD-10-CM | POA: Diagnosis not present

## 2017-09-22 NOTE — Telephone Encounter (Signed)
Left message to cal back if no reply will close encounter

## 2017-09-22 NOTE — Telephone Encounter (Addendum)
Called spoke to [place] for prior authorization information Over the phone not available  COVER MYMED key given --[place].   UNABLE TO PROCESS THROUGH COVER MY MED

## 2017-09-25 DIAGNOSIS — M25511 Pain in right shoulder: Secondary | ICD-10-CM | POA: Diagnosis not present

## 2017-09-25 DIAGNOSIS — M25561 Pain in right knee: Secondary | ICD-10-CM | POA: Diagnosis not present

## 2017-09-25 DIAGNOSIS — M79671 Pain in right foot: Secondary | ICD-10-CM | POA: Diagnosis not present

## 2017-09-25 DIAGNOSIS — Z5189 Encounter for other specified aftercare: Secondary | ICD-10-CM | POA: Diagnosis not present

## 2017-09-25 DIAGNOSIS — R262 Difficulty in walking, not elsewhere classified: Secondary | ICD-10-CM | POA: Diagnosis not present

## 2017-09-25 DIAGNOSIS — K5900 Constipation, unspecified: Secondary | ICD-10-CM | POA: Diagnosis not present

## 2017-09-28 NOTE — Telephone Encounter (Signed)
Left message for pt to call.

## 2017-10-02 DIAGNOSIS — Z5189 Encounter for other specified aftercare: Secondary | ICD-10-CM | POA: Diagnosis not present

## 2017-10-02 DIAGNOSIS — M25561 Pain in right knee: Secondary | ICD-10-CM | POA: Diagnosis not present

## 2017-10-02 DIAGNOSIS — R262 Difficulty in walking, not elsewhere classified: Secondary | ICD-10-CM | POA: Diagnosis not present

## 2017-10-02 DIAGNOSIS — K5900 Constipation, unspecified: Secondary | ICD-10-CM | POA: Diagnosis not present

## 2017-10-02 DIAGNOSIS — M25511 Pain in right shoulder: Secondary | ICD-10-CM | POA: Diagnosis not present

## 2017-10-02 DIAGNOSIS — M79671 Pain in right foot: Secondary | ICD-10-CM | POA: Diagnosis not present

## 2017-10-03 DIAGNOSIS — S82892D Other fracture of left lower leg, subsequent encounter for closed fracture with routine healing: Secondary | ICD-10-CM | POA: Diagnosis not present

## 2017-10-04 DIAGNOSIS — M25561 Pain in right knee: Secondary | ICD-10-CM | POA: Diagnosis not present

## 2017-10-04 DIAGNOSIS — K5900 Constipation, unspecified: Secondary | ICD-10-CM | POA: Diagnosis not present

## 2017-10-04 DIAGNOSIS — M25511 Pain in right shoulder: Secondary | ICD-10-CM | POA: Diagnosis not present

## 2017-10-04 DIAGNOSIS — I10 Essential (primary) hypertension: Secondary | ICD-10-CM | POA: Diagnosis not present

## 2017-10-04 DIAGNOSIS — Z5189 Encounter for other specified aftercare: Secondary | ICD-10-CM | POA: Diagnosis not present

## 2017-10-04 DIAGNOSIS — S82892A Other fracture of left lower leg, initial encounter for closed fracture: Secondary | ICD-10-CM | POA: Diagnosis not present

## 2017-10-04 DIAGNOSIS — I4891 Unspecified atrial fibrillation: Secondary | ICD-10-CM | POA: Diagnosis not present

## 2017-10-04 DIAGNOSIS — M79671 Pain in right foot: Secondary | ICD-10-CM | POA: Diagnosis not present

## 2017-10-04 DIAGNOSIS — R262 Difficulty in walking, not elsewhere classified: Secondary | ICD-10-CM | POA: Diagnosis not present

## 2017-10-09 DIAGNOSIS — M79671 Pain in right foot: Secondary | ICD-10-CM | POA: Diagnosis not present

## 2017-10-09 DIAGNOSIS — Z5189 Encounter for other specified aftercare: Secondary | ICD-10-CM | POA: Diagnosis not present

## 2017-10-09 DIAGNOSIS — M25561 Pain in right knee: Secondary | ICD-10-CM | POA: Diagnosis not present

## 2017-10-09 DIAGNOSIS — R05 Cough: Secondary | ICD-10-CM | POA: Diagnosis not present

## 2017-10-09 DIAGNOSIS — K5900 Constipation, unspecified: Secondary | ICD-10-CM | POA: Diagnosis not present

## 2017-10-09 DIAGNOSIS — M25511 Pain in right shoulder: Secondary | ICD-10-CM | POA: Diagnosis not present

## 2017-10-09 DIAGNOSIS — R262 Difficulty in walking, not elsewhere classified: Secondary | ICD-10-CM | POA: Diagnosis not present

## 2017-10-13 DIAGNOSIS — R262 Difficulty in walking, not elsewhere classified: Secondary | ICD-10-CM | POA: Diagnosis not present

## 2017-10-13 DIAGNOSIS — M79671 Pain in right foot: Secondary | ICD-10-CM | POA: Diagnosis not present

## 2017-10-13 DIAGNOSIS — K5900 Constipation, unspecified: Secondary | ICD-10-CM | POA: Diagnosis not present

## 2017-10-13 DIAGNOSIS — M25511 Pain in right shoulder: Secondary | ICD-10-CM | POA: Diagnosis not present

## 2017-10-13 DIAGNOSIS — M25561 Pain in right knee: Secondary | ICD-10-CM | POA: Diagnosis not present

## 2017-10-13 DIAGNOSIS — Z5189 Encounter for other specified aftercare: Secondary | ICD-10-CM | POA: Diagnosis not present

## 2017-10-17 DIAGNOSIS — S82892A Other fracture of left lower leg, initial encounter for closed fracture: Secondary | ICD-10-CM | POA: Diagnosis not present

## 2017-10-17 DIAGNOSIS — L0390 Cellulitis, unspecified: Secondary | ICD-10-CM | POA: Diagnosis not present

## 2017-10-17 DIAGNOSIS — R05 Cough: Secondary | ICD-10-CM | POA: Diagnosis not present

## 2017-10-18 DIAGNOSIS — M79671 Pain in right foot: Secondary | ICD-10-CM | POA: Diagnosis not present

## 2017-10-18 DIAGNOSIS — M25561 Pain in right knee: Secondary | ICD-10-CM | POA: Diagnosis not present

## 2017-10-18 DIAGNOSIS — Z5189 Encounter for other specified aftercare: Secondary | ICD-10-CM | POA: Diagnosis not present

## 2017-10-18 DIAGNOSIS — R262 Difficulty in walking, not elsewhere classified: Secondary | ICD-10-CM | POA: Diagnosis not present

## 2017-10-18 DIAGNOSIS — K5900 Constipation, unspecified: Secondary | ICD-10-CM | POA: Diagnosis not present

## 2017-10-18 DIAGNOSIS — M25511 Pain in right shoulder: Secondary | ICD-10-CM | POA: Diagnosis not present

## 2017-10-20 DIAGNOSIS — M25561 Pain in right knee: Secondary | ICD-10-CM | POA: Diagnosis not present

## 2017-10-20 DIAGNOSIS — K5900 Constipation, unspecified: Secondary | ICD-10-CM | POA: Diagnosis not present

## 2017-10-20 DIAGNOSIS — M25511 Pain in right shoulder: Secondary | ICD-10-CM | POA: Diagnosis not present

## 2017-10-20 DIAGNOSIS — R262 Difficulty in walking, not elsewhere classified: Secondary | ICD-10-CM | POA: Diagnosis not present

## 2017-10-20 DIAGNOSIS — Z5189 Encounter for other specified aftercare: Secondary | ICD-10-CM | POA: Diagnosis not present

## 2017-10-20 DIAGNOSIS — M79671 Pain in right foot: Secondary | ICD-10-CM | POA: Diagnosis not present

## 2017-10-23 DIAGNOSIS — R110 Nausea: Secondary | ICD-10-CM | POA: Diagnosis not present

## 2017-10-23 DIAGNOSIS — L0390 Cellulitis, unspecified: Secondary | ICD-10-CM | POA: Diagnosis not present

## 2017-10-25 DIAGNOSIS — M25511 Pain in right shoulder: Secondary | ICD-10-CM | POA: Diagnosis not present

## 2017-10-25 DIAGNOSIS — Z5189 Encounter for other specified aftercare: Secondary | ICD-10-CM | POA: Diagnosis not present

## 2017-10-25 DIAGNOSIS — M79671 Pain in right foot: Secondary | ICD-10-CM | POA: Diagnosis not present

## 2017-10-25 DIAGNOSIS — R262 Difficulty in walking, not elsewhere classified: Secondary | ICD-10-CM | POA: Diagnosis not present

## 2017-10-25 DIAGNOSIS — M25561 Pain in right knee: Secondary | ICD-10-CM | POA: Diagnosis not present

## 2017-10-25 DIAGNOSIS — K5900 Constipation, unspecified: Secondary | ICD-10-CM | POA: Diagnosis not present

## 2017-10-26 DIAGNOSIS — I10 Essential (primary) hypertension: Secondary | ICD-10-CM | POA: Diagnosis not present

## 2017-10-26 DIAGNOSIS — I4891 Unspecified atrial fibrillation: Secondary | ICD-10-CM | POA: Diagnosis not present

## 2017-10-26 DIAGNOSIS — S82892A Other fracture of left lower leg, initial encounter for closed fracture: Secondary | ICD-10-CM | POA: Diagnosis not present

## 2017-10-26 DIAGNOSIS — I509 Heart failure, unspecified: Secondary | ICD-10-CM | POA: Diagnosis not present

## 2017-10-30 NOTE — Progress Notes (Deleted)
HPI: FU atrial fibrillation. Echo 10/18 showed normal LV function, trace AI, moderate to severe LAE. Monitor 10/18 showed PAF. Admitted with CHF and atrial fibrillation 12/18; diuresed; placed on amiodarone; had DCCV. Since last seen,   Current Outpatient Medications  Medication Sig Dispense Refill  . acetaminophen (TYLENOL) 500 MG tablet Take 500 mg by mouth every 6 (six) hours as needed for headache (pain).     Marland Kitchen amiodarone (PACERONE) 200 MG tablet Take 1 tablet (200 mg total) by mouth daily. 90 tablet 3  . amLODipine (NORVASC) 2.5 MG tablet Take 1 tablet (2.5 mg total) by mouth daily. 180 tablet 3  . apixaban (ELIQUIS) 5 MG TABS tablet Take 1 tablet (5 mg total) by mouth 2 (two) times daily. 180 tablet 1  . furosemide (LASIX) 20 MG tablet Take 1 tablet (20 mg total) by mouth daily. 90 tablet 3  . levothyroxine (SYNTHROID, LEVOTHROID) 50 MCG tablet Take 50 mcg by mouth daily before breakfast.    . metoprolol tartrate (LOPRESSOR) 25 MG tablet Take 1 tablet (25 mg total) by mouth 2 (two) times daily. 180 tablet 3  . oxyCODONE-acetaminophen (PERCOCET/ROXICET) 5-325 MG tablet Take 1 tablet by mouth every 6 (six) hours as needed for moderate pain or severe pain. 12 tablet 0  . polyethylene glycol (MIRALAX / GLYCOLAX) packet Take 17 g by mouth daily as needed for mild constipation. 14 each 0  . polyvinyl alcohol (ARTIFICIAL TEARS) 1.4 % ophthalmic solution Place 1 drop into both eyes daily as needed for dry eyes (itching).    . sodium chloride (OCEAN) 0.65 % SOLN nasal spray Place 1 spray into both nostrils daily as needed for congestion.     No current facility-administered medications for this visit.      Past Medical History:  Diagnosis Date  . DDD (degenerative disc disease), cervical    severe/  auto fusion c2-c5  . Diverticulosis   . Hemorrhoids   . History of adenomatous polyp of colon    1995  adenomatous polyp/  1997 & 2003  hyperplastic polyp's  . History of bladder cancer  urologist-  dr [name]   s/p  TURBT's  . History of cancer of ureter   . Hypertension   . Hypothyroid   . Nocturia   . Osteoarthritis   . Osteopenia     Past Surgical History:  Procedure Laterality Date  . BACK SURGERY    . CARDIOVERSION N/A 08/09/2017   Procedure: CARDIOVERSION;  Surgeon: Larey Dresser, MD;  Location: [facility] ENDOSCOPY;  Service: Cardiovascular;  Laterality: N/A;  . CATARACT EXTRACTION W/ INTRAOCULAR LENS  IMPLANT, BILATERAL Bilateral   . CYSTOSCOPY W/ RETROGRADES  10/03/2011   Procedure: CYSTOSCOPY WITH RETROGRADE PYELOGRAM;  Surgeon: Molli Hazard, MD;  Location: [facility];  Service: Urology;;  . Consuela Mimes W/ RETROGRADES Bilateral 06/17/2016   Procedure: CYSTOSCOPY WITH RETROGRADE PYELOGRAM;  Surgeon: Alexis Frock, MD;  Location: [facility];  Service: Urology;  Laterality: Bilateral;  . CYSTOSCOPY WITH BIOPSY N/A 06/17/2016   Procedure: CYSTOSCOPY WITH BIOPSY AND FULGERATION;  Surgeon: Alexis Frock, MD;  Location: [facility];  Service: Urology;  Laterality: N/A;  . LAMINECTOMY  2009   L3 - 4  . ORIF ANKLE FRACTURE Left 08/28/2017   Procedure: OPEN REDUCTION INTERNAL FIXATION (ORIF) ANKLE FRACTURE;  Surgeon: Shona Needles, MD;  Location: [facility];  Service: Orthopedics;  Laterality: Left;  . TRANSURETHRAL RESECTION OF BLADDER  x3 --  2009;  2010; 06-29-2010  . TRANSURETHRAL RESECTION OF BLADDER TUMOR  10/03/2011   Procedure: TRANSURETHRAL RESECTION OF BLADDER TUMOR (TURBT);  Surgeon: Molli Hazard, MD;  Location: [facility];  Service: Urology;  Laterality: N/A;    Social History   Socioeconomic History  . Marital status: Widowed    Spouse name: Not on file  . Number of children: Not on file  . Years of education: Not on file  . Highest education level: Not on file  Social Needs  . Financial resource strain: Not on file  . Food insecurity - worry: Not on file  . Food insecurity -  inability: Not on file  . Transportation needs - medical: Not on file  . Transportation needs - non-medical: Not on file  Occupational History  . Occupation: Retired  Tobacco Use  . Smoking status: Former Smoker    Years: 10.00    Types: Cigarettes    Last attempt to quit: 09/25/2005    Years since quitting: 12.1  . Smokeless tobacco: Never Used  Substance and Sexual Activity  . Alcohol use: Yes  . Drug use: No  . Sexual activity: Not on file  Other Topics Concern  . Not on file  Social History Narrative  . Not on file    Family History  Problem Relation Age of Onset  . Breast cancer Sister   . Stomach cancer Sister   . Hernia Sister 82       HERNIA SURGERY COMPLICATIONS  . Colon cancer Neg Hx     ROS: no fevers or chills, productive cough, hemoptysis, dysphasia, odynophagia, melena, hematochezia, dysuria, hematuria, rash, seizure activity, orthopnea, PND, pedal edema, claudication. Remaining systems are negative.  Physical Exam: Well-developed well-nourished in no acute distress.  Skin is warm and dry.  HEENT is normal.  Neck is supple.  Chest is clear to auscultation with normal expansion.  Cardiovascular exam is regular rate and rhythm.  Abdominal exam nontender or distended. No masses palpated. Extremities show no edema. neuro grossly intact  ECG- personally reviewed  A/P  1  Kirk Ruths, MD

## 2017-10-31 DIAGNOSIS — S82892D Other fracture of left lower leg, subsequent encounter for closed fracture with routine healing: Secondary | ICD-10-CM | POA: Diagnosis not present

## 2017-11-03 DIAGNOSIS — I482 Chronic atrial fibrillation: Secondary | ICD-10-CM | POA: Diagnosis not present

## 2017-11-03 DIAGNOSIS — S8252XD Displaced fracture of medial malleolus of left tibia, subsequent encounter for closed fracture with routine healing: Secondary | ICD-10-CM | POA: Diagnosis not present

## 2017-11-03 DIAGNOSIS — S82832D Other fracture of upper and lower end of left fibula, subsequent encounter for closed fracture with routine healing: Secondary | ICD-10-CM | POA: Diagnosis not present

## 2017-11-03 DIAGNOSIS — I5032 Chronic diastolic (congestive) heart failure: Secondary | ICD-10-CM | POA: Diagnosis not present

## 2017-11-03 DIAGNOSIS — I110 Hypertensive heart disease with heart failure: Secondary | ICD-10-CM | POA: Diagnosis not present

## 2017-11-03 DIAGNOSIS — I700 Atherosclerosis of aorta: Secondary | ICD-10-CM | POA: Diagnosis not present

## 2017-11-07 ENCOUNTER — Ambulatory Visit: Payer: Medicare Other | Admitting: Cardiology

## 2017-11-07 DIAGNOSIS — I5032 Chronic diastolic (congestive) heart failure: Secondary | ICD-10-CM | POA: Diagnosis not present

## 2017-11-07 DIAGNOSIS — S8252XD Displaced fracture of medial malleolus of left tibia, subsequent encounter for closed fracture with routine healing: Secondary | ICD-10-CM | POA: Diagnosis not present

## 2017-11-07 DIAGNOSIS — I482 Chronic atrial fibrillation: Secondary | ICD-10-CM | POA: Diagnosis not present

## 2017-11-07 DIAGNOSIS — I110 Hypertensive heart disease with heart failure: Secondary | ICD-10-CM | POA: Diagnosis not present

## 2017-11-07 DIAGNOSIS — S82832D Other fracture of upper and lower end of left fibula, subsequent encounter for closed fracture with routine healing: Secondary | ICD-10-CM | POA: Diagnosis not present

## 2017-11-07 DIAGNOSIS — I700 Atherosclerosis of aorta: Secondary | ICD-10-CM | POA: Diagnosis not present

## 2017-11-08 DIAGNOSIS — I5032 Chronic diastolic (congestive) heart failure: Secondary | ICD-10-CM | POA: Diagnosis not present

## 2017-11-08 DIAGNOSIS — I700 Atherosclerosis of aorta: Secondary | ICD-10-CM | POA: Diagnosis not present

## 2017-11-08 DIAGNOSIS — S8252XD Displaced fracture of medial malleolus of left tibia, subsequent encounter for closed fracture with routine healing: Secondary | ICD-10-CM | POA: Diagnosis not present

## 2017-11-08 DIAGNOSIS — I482 Chronic atrial fibrillation: Secondary | ICD-10-CM | POA: Diagnosis not present

## 2017-11-08 DIAGNOSIS — S82832D Other fracture of upper and lower end of left fibula, subsequent encounter for closed fracture with routine healing: Secondary | ICD-10-CM | POA: Diagnosis not present

## 2017-11-08 DIAGNOSIS — I110 Hypertensive heart disease with heart failure: Secondary | ICD-10-CM | POA: Diagnosis not present

## 2017-11-09 DIAGNOSIS — S82892S Other fracture of left lower leg, sequela: Secondary | ICD-10-CM | POA: Diagnosis not present

## 2017-11-10 DIAGNOSIS — I5032 Chronic diastolic (congestive) heart failure: Secondary | ICD-10-CM | POA: Diagnosis not present

## 2017-11-10 DIAGNOSIS — I700 Atherosclerosis of aorta: Secondary | ICD-10-CM | POA: Diagnosis not present

## 2017-11-10 DIAGNOSIS — S82832D Other fracture of upper and lower end of left fibula, subsequent encounter for closed fracture with routine healing: Secondary | ICD-10-CM | POA: Diagnosis not present

## 2017-11-10 DIAGNOSIS — S8252XD Displaced fracture of medial malleolus of left tibia, subsequent encounter for closed fracture with routine healing: Secondary | ICD-10-CM | POA: Diagnosis not present

## 2017-11-10 DIAGNOSIS — I110 Hypertensive heart disease with heart failure: Secondary | ICD-10-CM | POA: Diagnosis not present

## 2017-11-10 DIAGNOSIS — I482 Chronic atrial fibrillation: Secondary | ICD-10-CM | POA: Diagnosis not present

## 2017-11-12 ENCOUNTER — Encounter (HOSPITAL_COMMUNITY): Payer: Self-pay

## 2017-11-12 ENCOUNTER — Emergency Department (HOSPITAL_COMMUNITY): Payer: Medicare Other

## 2017-11-12 ENCOUNTER — Other Ambulatory Visit: Payer: Self-pay

## 2017-11-12 ENCOUNTER — Inpatient Hospital Stay (HOSPITAL_COMMUNITY)
Admission: EM | Admit: 2017-11-12 | Discharge: 2017-11-16 | DRG: 291 | Disposition: A | Discharge status: Skilled Nursing Facility | Payer: Medicare Other | Attending: Internal Medicine | Admitting: Internal Medicine

## 2017-11-12 DIAGNOSIS — I10 Essential (primary) hypertension: Secondary | ICD-10-CM | POA: Diagnosis present

## 2017-11-12 DIAGNOSIS — R0902 Hypoxemia: Secondary | ICD-10-CM | POA: Diagnosis not present

## 2017-11-12 DIAGNOSIS — I130 Hypertensive heart and chronic kidney disease with heart failure and stage 1 through stage 4 chronic kidney disease, or unspecified chronic kidney disease: Principal | ICD-10-CM | POA: Diagnosis present

## 2017-11-12 DIAGNOSIS — D631 Anemia in chronic kidney disease: Secondary | ICD-10-CM | POA: Diagnosis present

## 2017-11-12 DIAGNOSIS — I1310 Hypertensive heart and chronic kidney disease without heart failure, with stage 1 through stage 4 chronic kidney disease, or unspecified chronic kidney disease: Secondary | ICD-10-CM

## 2017-11-12 DIAGNOSIS — Z881 Allergy status to other antibiotic agents status: Secondary | ICD-10-CM | POA: Diagnosis not present

## 2017-11-12 DIAGNOSIS — I110 Hypertensive heart disease with heart failure: Secondary | ICD-10-CM | POA: Diagnosis not present

## 2017-11-12 DIAGNOSIS — N19 Unspecified kidney failure: Secondary | ICD-10-CM | POA: Diagnosis not present

## 2017-11-12 DIAGNOSIS — N189 Chronic kidney disease, unspecified: Secondary | ICD-10-CM | POA: Diagnosis present

## 2017-11-12 DIAGNOSIS — I5040 Unspecified combined systolic (congestive) and diastolic (congestive) heart failure: Secondary | ICD-10-CM | POA: Diagnosis not present

## 2017-11-12 DIAGNOSIS — I482 Chronic atrial fibrillation, unspecified: Secondary | ICD-10-CM | POA: Diagnosis present

## 2017-11-12 DIAGNOSIS — I5033 Acute on chronic diastolic (congestive) heart failure: Secondary | ICD-10-CM | POA: Diagnosis present

## 2017-11-12 DIAGNOSIS — Z9981 Dependence on supplemental oxygen: Secondary | ICD-10-CM | POA: Diagnosis not present

## 2017-11-12 DIAGNOSIS — Z79899 Other long term (current) drug therapy: Secondary | ICD-10-CM | POA: Diagnosis not present

## 2017-11-12 DIAGNOSIS — J9601 Acute respiratory failure with hypoxia: Secondary | ICD-10-CM | POA: Diagnosis present

## 2017-11-12 DIAGNOSIS — I5031 Acute diastolic (congestive) heart failure: Secondary | ICD-10-CM | POA: Insufficient documentation

## 2017-11-12 DIAGNOSIS — M5031 Other cervical disc degeneration,  high cervical region: Secondary | ICD-10-CM | POA: Diagnosis not present

## 2017-11-12 DIAGNOSIS — I509 Heart failure, unspecified: Secondary | ICD-10-CM

## 2017-11-12 DIAGNOSIS — I480 Paroxysmal atrial fibrillation: Secondary | ICD-10-CM | POA: Diagnosis present

## 2017-11-12 DIAGNOSIS — N179 Acute kidney failure, unspecified: Secondary | ICD-10-CM | POA: Diagnosis not present

## 2017-11-12 DIAGNOSIS — Z888 Allergy status to other drugs, medicaments and biological substances status: Secondary | ICD-10-CM | POA: Diagnosis not present

## 2017-11-12 DIAGNOSIS — M8580 Other specified disorders of bone density and structure, unspecified site: Secondary | ICD-10-CM | POA: Diagnosis present

## 2017-11-12 DIAGNOSIS — E039 Hypothyroidism, unspecified: Secondary | ICD-10-CM | POA: Diagnosis not present

## 2017-11-12 DIAGNOSIS — Z981 Arthrodesis status: Secondary | ICD-10-CM

## 2017-11-12 DIAGNOSIS — D649 Anemia, unspecified: Secondary | ICD-10-CM | POA: Diagnosis not present

## 2017-11-12 DIAGNOSIS — M1990 Unspecified osteoarthritis, unspecified site: Secondary | ICD-10-CM | POA: Diagnosis present

## 2017-11-12 DIAGNOSIS — R001 Bradycardia, unspecified: Secondary | ICD-10-CM | POA: Diagnosis not present

## 2017-11-12 DIAGNOSIS — J80 Acute respiratory distress syndrome: Secondary | ICD-10-CM | POA: Diagnosis not present

## 2017-11-12 DIAGNOSIS — I350 Nonrheumatic aortic (valve) stenosis: Secondary | ICD-10-CM | POA: Diagnosis present

## 2017-11-12 DIAGNOSIS — M5030 Other cervical disc degeneration, unspecified cervical region: Secondary | ICD-10-CM | POA: Diagnosis present

## 2017-11-12 DIAGNOSIS — Z7901 Long term (current) use of anticoagulants: Secondary | ICD-10-CM

## 2017-11-12 DIAGNOSIS — R0602 Shortness of breath: Secondary | ICD-10-CM | POA: Diagnosis not present

## 2017-11-12 DIAGNOSIS — R488 Other symbolic dysfunctions: Secondary | ICD-10-CM | POA: Diagnosis not present

## 2017-11-12 DIAGNOSIS — Z87891 Personal history of nicotine dependence: Secondary | ICD-10-CM | POA: Diagnosis not present

## 2017-11-12 DIAGNOSIS — Z9181 History of falling: Secondary | ICD-10-CM | POA: Diagnosis not present

## 2017-11-12 DIAGNOSIS — I340 Nonrheumatic mitral (valve) insufficiency: Secondary | ICD-10-CM | POA: Diagnosis not present

## 2017-11-12 DIAGNOSIS — M6281 Muscle weakness (generalized): Secondary | ICD-10-CM | POA: Diagnosis not present

## 2017-11-12 DIAGNOSIS — R262 Difficulty in walking, not elsewhere classified: Secondary | ICD-10-CM | POA: Diagnosis not present

## 2017-11-12 DIAGNOSIS — I129 Hypertensive chronic kidney disease with stage 1 through stage 4 chronic kidney disease, or unspecified chronic kidney disease: Secondary | ICD-10-CM | POA: Diagnosis not present

## 2017-11-12 LAB — HEPATIC FUNCTION PANEL
ALK PHOS: 76 U/L (ref 38–126)
ALT: 21 U/L (ref 14–54)
AST: 16 U/L (ref 15–41)
Albumin: 3.2 g/dL — ABNORMAL LOW (ref 3.5–5.0)
BILIRUBIN DIRECT: 0.2 mg/dL (ref 0.1–0.5)
Indirect Bilirubin: 0.5 mg/dL (ref 0.3–0.9)
TOTAL PROTEIN: 6.2 g/dL — AB (ref 6.5–8.1)
Total Bilirubin: 0.7 mg/dL (ref 0.3–1.2)

## 2017-11-12 LAB — I-STAT CHEM 8, ED
BUN: 22 mg/dL — ABNORMAL HIGH (ref 6–20)
CALCIUM ION: 1.13 mmol/L — AB (ref 1.15–1.40)
CHLORIDE: 103 mmol/L (ref 101–111)
Creatinine, Ser: 1.2 mg/dL — ABNORMAL HIGH (ref 0.44–1.00)
GLUCOSE: 123 mg/dL — AB (ref 65–99)
HCT: 31 % — ABNORMAL LOW (ref 36.0–46.0)
HEMOGLOBIN: 10.5 g/dL — AB (ref 12.0–15.0)
Potassium: 4 mmol/L (ref 3.5–5.1)
SODIUM: 139 mmol/L (ref 135–145)
TCO2: 28 mmol/L (ref 22–32)

## 2017-11-12 LAB — I-STAT TROPONIN, ED: Troponin i, poc: 0.01 ng/mL (ref 0.00–0.08)

## 2017-11-12 LAB — BRAIN NATRIURETIC PEPTIDE: B Natriuretic Peptide: 871.5 pg/mL — ABNORMAL HIGH (ref 0.0–100.0)

## 2017-11-12 MED ORDER — AMIODARONE HCL 200 MG PO TABS
200.0000 mg | ORAL_TABLET | Freq: Every day | ORAL | Status: DC
  Administered 2017-11-13 – 2017-11-16 (×4): 200 mg via ORAL
  Filled 2017-11-12 (×4): qty 1

## 2017-11-12 MED ORDER — ACETAMINOPHEN 650 MG RE SUPP: 650.0000 mg | Freq: Four times a day (QID) | RECTAL | Status: DC | PRN

## 2017-11-12 MED ORDER — AMLODIPINE BESYLATE 5 MG PO TABS
2.5000 mg | ORAL_TABLET | Freq: Every day | ORAL | Status: DC
  Administered 2017-11-13 – 2017-11-16 (×4): 2.5 mg via ORAL
  Filled 2017-11-12 (×4): qty 1

## 2017-11-12 MED ORDER — METOPROLOL TARTRATE 25 MG PO TABS
25.0000 mg | ORAL_TABLET | Freq: Two times a day (BID) | ORAL | Status: DC
  Administered 2017-11-12: 25 mg via ORAL
  Filled 2017-11-12: qty 1

## 2017-11-12 MED ORDER — POLYETHYLENE GLYCOL 3350 17 G PO PACK: 17.0000 g | PACK | Freq: Every day | ORAL | Status: DC | PRN

## 2017-11-12 MED ORDER — FUROSEMIDE 10 MG/ML IJ SOLN
40.0000 mg | Freq: Every day | INTRAMUSCULAR | Status: DC
  Administered 2017-11-13 – 2017-11-14 (×2): 40 mg via INTRAVENOUS
  Filled 2017-11-12 (×2): qty 4

## 2017-11-12 MED ORDER — SODIUM CHLORIDE 0.9% FLUSH
3.0000 mL | Freq: Two times a day (BID) | INTRAVENOUS | Status: DC
  Administered 2017-11-12 – 2017-11-16 (×8): 3 mL via INTRAVENOUS

## 2017-11-12 MED ORDER — FUROSEMIDE 10 MG/ML IJ SOLN
40.0000 mg | Freq: Once | INTRAMUSCULAR | Status: AC
  Administered 2017-11-12: 40 mg via INTRAVENOUS
  Filled 2017-11-12: qty 4

## 2017-11-12 MED ORDER — SODIUM CHLORIDE 0.9% FLUSH: 3.0000 mL | INTRAVENOUS | Status: DC | PRN

## 2017-11-12 MED ORDER — ACETAMINOPHEN 325 MG PO TABS: 650.0000 mg | ORAL_TABLET | Freq: Four times a day (QID) | ORAL | Status: DC | PRN

## 2017-11-12 MED ORDER — APIXABAN 5 MG PO TABS
5.0000 mg | ORAL_TABLET | Freq: Two times a day (BID) | ORAL | Status: DC
  Administered 2017-11-12 – 2017-11-16 (×8): 5 mg via ORAL
  Filled 2017-11-12 (×8): qty 1

## 2017-11-12 MED ORDER — LEVOTHYROXINE SODIUM 50 MCG PO TABS
50.0000 ug | ORAL_TABLET | Freq: Every day | ORAL | Status: DC
  Administered 2017-11-13 – 2017-11-16 (×4): 50 ug via ORAL
  Filled 2017-11-12 (×4): qty 1

## 2017-11-12 MED ORDER — OXYCODONE-ACETAMINOPHEN 5-325 MG PO TABS: 1.0000 | ORAL_TABLET | Freq: Four times a day (QID) | ORAL | Status: DC | PRN

## 2017-11-12 MED ORDER — SODIUM CHLORIDE 0.9 % IV SOLN: 250.0000 mL | INTRAVENOUS | Status: DC | PRN

## 2017-11-12 NOTE — Progress Notes (Signed)
[facility] for Eliquis Indication: hx atrial fibrillation  Allergies  Allergen Reactions  . Flagyl [Metronidazole] Hives  . Lisinopril Cough    Patient Measurements:   Heparin Dosing Weight:   Vital Signs: Temp: 97.6 F (36.4 C) (03/10 1713) Temp Source: Oral (03/10 1713) BP: 135/50 (03/10 2100) Pulse Rate: 63 (03/10 2100)  Labs: Recent Labs    11/12/17 1843  HGB 10.5*  HCT 31.0*  CREATININE 1.20*    CrCl cannot be calculated (Unknown ideal weight.).   Medications:  PTA: Eliquis 5 mg bid  Assessment: Patient is a 84 y.o F with hx Afib on Eliquis PTA, presented to the ED on 11/12/17 with c/o SOB. To resume Eliquis inpatient.   Plan:  - Eliquis 5 mg PO bid - pharmacy will sign off, but will follow patient peripherally along with you   Sruti Ayllon P 11/12/2017,9:05 PM

## 2017-11-12 NOTE — H&P (Signed)
TR[facility] H&P   Patient Demographics:    Rebecca Norman, is a 84 y.o. female  MRN: [id number]   DOB - November 14, 1924  Admit Date - 11/12/2017  Outpatient Primary MD for the patient is [name], MD  Referring MD/NP/PA:    Daleen Bo  Outpatient Specialists:    Patient coming from: home=> pcp office  Chief Complaint  Patient presents with  . Shortness of Breath      HPI:    Rebecca Norman  is a 84 y.o. female, w Hypertension, Hypothyroidism, apparently presents with dyspnea for the past 3 days.  She was apparently seen at PCP office and found to be hypoxic and transferred to ER by private vehicle.  Pt notes slight lower ext edema. Denies fever, chills, cough, cp, palp, n/v, diarrhea, brbpr, black stool.  Pt states exertion makes her breathing worse.   In ED, pt noted to be hypoxic to 79%  CXR IMPRESSION: Findings consistent with mild congestive heart failure without significant change from the earlier exam.  Alb 3.2, Ast 16, Alt 21, alk phos 76, T. Bili 0.7 BNP 871.5 Na 139, K 4.0, Bun 22, Creatinine 1.2 Hgb 10.5 Trop 0.01  Pt will be admitted for CHF    Review of systems:    In addition to the HPI above,  No Fever-chills, No Headache, No changes with Vision or hearing, No problems swallowing food or Liquids, No Chest pain, No Cough No Abdominal pain, No Nausea or Vommitting, Bowel movements are regular, No Blood in stool or Urine, No dysuria, No new skin rashes or bruises, No new joints pains-aches,  No new weakness, tingling, numbness in any extremity, No recent weight gain or loss, No polyuria, polydypsia or polyphagia, No significant Mental Stressors.  A full 10 point Review of Systems was done, except as stated above, all other Review of Systems were negative.   With Past History of the following :    Past Medical History:  Diagnosis Date  . DDD  (degenerative disc disease), cervical    severe/  auto fusion c2-c5  . Diverticulosis   . Hemorrhoids   . History of adenomatous polyp of colon    1995  adenomatous polyp/  1997 & 2003  hyperplastic polyp's  . History of bladder cancer urologist-  dr [name]   s/p  TURBT's  . History of cancer of ureter   . Hypertension   . Hypothyroid   . Nocturia   . Osteoarthritis   . Osteopenia       Past Surgical History:  Procedure Laterality Date  . BACK SURGERY    . CARDIOVERSION N/A 08/09/2017   Procedure: CARDIOVERSION;  Surgeon: Larey Dresser, MD;  Location: [facility] ENDOSCOPY;  Service: Cardiovascular;  Laterality: N/A;  . CATARACT EXTRACTION W/ INTRAOCULAR LENS  IMPLANT, BILATERAL Bilateral   . CYSTOSCOPY W/ RETROGRADES  10/03/2011   Procedure: CYSTOSCOPY WITH RETROGRADE  [email or web address];  Surgeon: Molli Hazard, MD;  Location: [facility];  Service: Urology;;  . Consuela Mimes W/ RETROGRADES Bilateral 06/17/2016   Procedure: CYSTOSCOPY WITH RETROGRADE PYELOGRAM;  Surgeon: Alexis Frock, MD;  Location: [facility];  Service: Urology;  Laterality: Bilateral;  . CYSTOSCOPY WITH BIOPSY N/A 06/17/2016   Procedure: CYSTOSCOPY WITH BIOPSY AND FULGERATION;  Surgeon: Alexis Frock, MD;  Location: [facility];  Service: Urology;  Laterality: N/A;  . LAMINECTOMY  2009   L3 - 4  . ORIF ANKLE FRACTURE Left 08/28/2017   Procedure: OPEN REDUCTION INTERNAL FIXATION (ORIF) ANKLE FRACTURE;  Surgeon: Shona Needles, MD;  Location: [facility];  Service: Orthopedics;  Laterality: Left;  . TRANSURETHRAL RESECTION OF BLADDER  x3 --  2009; 2010; 06-29-2010  . TRANSURETHRAL RESECTION OF BLADDER TUMOR  10/03/2011   Procedure: TRANSURETHRAL RESECTION OF BLADDER TUMOR (TURBT);  Surgeon: Molli Hazard, MD;  Location: [facility];  Service: Urology;  Laterality: N/A;      Social History:     Social History   Tobacco Use  . Smoking status: Former  Smoker    Years: 10.00    Types: Cigarettes    Last attempt to quit: 09/25/2005    Years since quitting: 12.1  . Smokeless tobacco: Never Used  Substance Use Topics  . Alcohol use: Yes     Lives - at home  Mobility - walks by self   Family History :     Family History  Problem Relation Age of Onset  . Breast cancer Sister   . Stomach cancer Sister   . Hernia Sister 77       HERNIA SURGERY COMPLICATIONS  . Colon cancer Neg Hx        Home Medications:   Prior to Admission medications   Medication Sig Start Date End Date Taking? Authorizing Provider  acetaminophen (TYLENOL) 500 MG tablet Take 500 mg by mouth every 6 (six) hours as needed for headache (pain).    Yes [provider]  amiodarone (PACERONE) 200 MG tablet Take 1 tablet (200 mg total) by mouth daily. 08/25/17  Yes Almyra Deforest, PA  amLODipine (NORVASC) 2.5 MG tablet Take 1 tablet (2.5 mg total) by mouth daily. 08/23/17 11/21/17 Yes Almyra Deforest, PA  apixaban (ELIQUIS) 5 MG TABS tablet Take 1 tablet (5 mg total) by mouth 2 (two) times daily. 07/26/17  Yes Sherran Needs, NP  furosemide (LASIX) 20 MG tablet Take 1 tablet (20 mg total) by mouth daily. 08/12/17  Yes Strader, [place] M, PA-C  levothyroxine (SYNTHROID, LEVOTHROID) 50 MCG tablet Take 50 mcg by mouth daily before breakfast.   Yes [provider]  metoprolol tartrate (LOPRESSOR) 25 MG tablet Take 1 tablet (25 mg total) by mouth 2 (two) times daily. 08/11/17 11/12/17 Yes Strader, Fransisco Hertz, PA-C  oxyCODONE-acetaminophen (PERCOCET/ROXICET) 5-325 MG tablet Take 1 tablet by mouth every 6 (six) hours as needed for moderate pain or severe pain. 09/01/17   Patrecia Pour, MD  polyethylene glycol (MIRALAX / Floria Raveling) packet Take 17 g by mouth daily as needed for mild constipation. 09/01/17   Patrecia Pour, MD     Allergies:     Allergies  Allergen Reactions  . Flagyl [Metronidazole] Hives  . Lisinopril Cough     Physical Exam:   Vitals  Blood  pressure 121/67, pulse (!) 59, temperature 97.6 F (36.4 C), temperature source Oral, resp. rate (!) 21, SpO2 97 %.   1.  General  lying in bed in NAD,   2. Normal affect and insight, Not Suicidal or Homicidal, Awake Alert, Oriented X 3.  3. No F.N deficits, ALL C.Nerves Intact, Strength 5/5 all 4 extremities, Sensation intact all 4 extremities, Plantars down going.  4. Ears and Eyes appear Normal, Conjunctivae clear, PERRLA. Moist Oral Mucosa.  5. Supple Neck, + JVD, No cervical lymphadenopathy appriciated, No Carotid Bruits.  6. Symmetrical Chest wall movement, Good air movement bilaterally, slight crackles in bilateral bases, no wheezing.  7. RRR, 1/6 sem Rusb  8. Positive Bowel Sounds, Abdomen Soft, No tenderness, No organomegaly appriciated,No rebound -guarding or rigidity.  9.  No Cyanosis, Normal Skin Turgor, No Skin Rash or Bruise.  10. Good muscle tone,  joints appear normal , no effusions, Normal ROM.  11. No Palpable Lymph Nodes in Neck or Axillae     Data Review:    CBC Recent Labs  Lab 11/12/17 1843  HGB 10.5*  HCT 31.0*   ------------------------------------------------------------------------------------------------------------------  Chemistries  Recent Labs  Lab 11/12/17 1747 11/12/17 1843  NA  --  139  K  --  4.0  CL  --  103  GLUCOSE  --  123*  BUN  --  22*  CREATININE  --  1.20*  AST 16  --   ALT 21  --   ALKPHOS 76  --   BILITOT 0.7  --    ------------------------------------------------------------------------------------------------------------------ CrCl cannot be calculated (Unknown ideal weight.). ------------------------------------------------------------------------------------------------------------------ No results for input(s): TSH, T4TOTAL, T3FREE, THYROIDAB in the last 72 hours.  Invalid input(s): FREET3  Coagulation profile No results for input(s): INR, PROTIME in the last 168  hours. ------------------------------------------------------------------------------------------------------------------- No results for input(s): DDIMER in the last 72 hours. -------------------------------------------------------------------------------------------------------------------  Cardiac Enzymes No results for input(s): CKMB, TROPONINI, MYOGLOBIN in the last 168 hours.  Invalid input(s): CK ------------------------------------------------------------------------------------------------------------------    Component Value Date/Time   BNP 871.5 (H) 11/12/2017 1748     ---------------------------------------------------------------------------------------------------------------  Urinalysis    Component Value Date/Time   COLORURINE YELLOW 12/30/2008 1535   APPEARANCEUR CLEAR 12/30/2008 1535   LABSPEC 1.019 12/30/2008 1535   PHURINE 5.5 12/30/2008 1535   GLUCOSEU NEGATIVE 12/30/2008 1535   HGBUR SMALL (A) 12/30/2008 1535   BILIRUBINUR NEGATIVE 12/30/2008 1535   KETONESUR NEGATIVE 12/30/2008 1535   PROTEINUR NEGATIVE 12/30/2008 1535   UROBILINOGEN 0.2 12/30/2008 1535   NITRITE NEGATIVE 12/30/2008 1535   LEUKOCYTESUR NEGATIVE 12/30/2008 1535    ----------------------------------------------------------------------------------------------------------------   Imaging Results:    Dg Chest Port 1 View  Result Date: 11/12/2017 CLINICAL DATA:  She was seen at [facility] walk-in clinic today and was found to have low SPO2. Her family are with her and tell me that pt. Has had hx of these symptoms "but it was worse and she had to be admitted to the hospital--the doctors said it is a fluid build-up".; hx ureter CA; ex smoker EXAM: PORTABLE CHEST 1 VIEW COMPARISON:  Exam performed earlier today. FINDINGS: Cardiac silhouette is mildly enlarged. No mediastinal or hilar masses. There prominent bronchovascular markings. Hazy opacities noted at both lung bases consistent with pleural  effusions with associated atelectasis. No pneumothorax. Skeletal structures are demineralized but grossly intact. IMPRESSION: Findings consistent with mild congestive heart failure without significant change from the earlier exam. Electronically Signed   By: Lajean Manes M.D.   On: 11/12/2017 18:18    nsr at 65, nl axis, no st-t changes c/w ischemia   Assessment & Plan:    Principal Problem:   Acute  CHF (congestive heart failure) (HCC) Active Problems:   HTN (hypertension)   Hypothyroid   Atrial fibrillation, chronic (HCC)   Anemia    Acute CHF (EF 60-65%, 06/29/2017) Tele Trop I q6h x3 TSH Check cardiac echo Start Lasix '40mg'$  iv qday  Pafib Cont Metoprolol '25mg'$  po bid Cont Amiodarone  Cont Eliquis  Hypothyroidism Cont Levothyroxine  Hypertension Cont Amlodipine 2.'5mg'$  po qday Cont Metoprolol   DVT Prophylaxis  Eliquis  AM Labs Ordered, also please review Full Orders  Family Communication: Admission, patients condition and plan of care including tests being ordered have been discussed with the patient who indicate understanding and agree with the plan and Code Status.  Code Status  FULL CODE  Likely DC to  home  Condition GUARDED    Consults called:  Cardiology by email  Admission status: inpatient  Time spent in minutes : 45   Jani Gravel M.D on 11/12/2017 at 8:45 PM  Between 7am to 7pm - Pager - [phone]   After 7pm go to [email or web address] - password [facility]  [facility] - Office  [phone]

## 2017-11-12 NOTE — ED Notes (Signed)
1x attempt to call report, Rebecca Norman will call back.

## 2017-11-12 NOTE — ED Triage Notes (Signed)
She was seen at [facility] walk-in clinic today and was found to have low SPO2. Her family are with her and tell me that pt. Has had hx of these symptoms "but it was worse and she had to be admitted to the hospital--the doctors said it is a fluid build-up". Pt. Is pale and in no distress.

## 2017-11-12 NOTE — ED Notes (Signed)
ED TO INPATIENT HANDOFF REPORT  Name/Age/Gender Jamie Kato Hsiung 84 y.o. female  Code Status Code Status History    Date Active Date Inactive Code Status Order ID Comments User Context   08/25/2017 16:09 09/01/2017 19:04 Full Code [id number]  Ivor Costa, MD ED      Home/SNF/Other Home  Chief Complaint Fluid on Lungs / Shortness of Breath/ Dry Cough/ Generalized Weakness   Level of Care/Admitting Diagnosis ED Disposition    ED Disposition Condition [facility] Area: [place] [[id number]]  Level of Care: Telemetry [5]  Admit to tele based on following criteria: Other see comments  Comments: chf  Diagnosis: CHF (congestive heart failure) [facility]) [[id number]]  Admitting Physician: Jani Gravel [[id number]]  Attending Physician: Jani Gravel [phone]  Estimated length of stay: past midnight tomorrow  Certification:: I certify this patient will need inpatient services for at least 2 midnights  PT Class (Do Not Modify): Inpatient [101]  PT Acc Code (Do Not Modify): Private [1]       Medical History Past Medical History:  Diagnosis Date  . DDD (degenerative disc disease), cervical    severe/  auto fusion c2-c5  . Diverticulosis   . Hemorrhoids   . History of adenomatous polyp of colon    1995  adenomatous polyp/  1997 & 2003  hyperplastic polyp's  . History of bladder cancer urologist-  dr [name]   s/p  TURBT's  . History of cancer of ureter   . Hypertension   . Hypothyroid   . Nocturia   . Osteoarthritis   . Osteopenia     Allergies Allergies  Allergen Reactions  . Flagyl [Metronidazole] Hives  . Lisinopril Cough    IV Location/Drains/Wounds Patient Lines/Drains/Airways Status   Active Line/Drains/Airways    Name:   Placement date:   Placement time:   Site:   Days:   Peripheral IV 11/12/17 Right Antecubital   11/12/17    1833    Antecubital   less than 1   External Urinary Catheter   11/12/17    2018    -   less than 1           Labs/Imaging Results for orders placed or performed during the hospital encounter of 11/12/17 (from the past 48 hour(s))  Hepatic function panel     Status: Abnormal   Collection Time: 11/12/17  5:47 PM  Result Value Ref Range   Total Protein 6.2 (L) 6.5 - 8.1 g/dL   Albumin 3.2 (L) 3.5 - 5.0 g/dL   AST 16 15 - 41 U/L   ALT 21 14 - 54 U/L   Alkaline Phosphatase 76 38 - 126 U/L   Total Bilirubin 0.7 0.3 - 1.2 mg/dL   Bilirubin, Direct 0.2 0.1 - 0.5 mg/dL   Indirect Bilirubin 0.5 0.3 - 0.9 mg/dL    Comment: Performed at [facility], [place] [address] [place], [place] [address]  Brain natriuretic peptide     Status: Abnormal   Collection Time: 11/12/17  5:48 PM  Result Value Ref Range   B Natriuretic Peptide 871.5 (H) 0.0 - 100.0 pg/mL    Comment: Performed at [facility], [place] [address] [place], [place] [address]  I-stat Chem 8, ED     Status: Abnormal   Collection Time: 11/12/17  6:43 PM  Result Value Ref Range   Sodium 139 135 - 145 mmol/L   Potassium 4.0 3.5 - 5.1 mmol/L   Chloride  103 101 - 111 mmol/L   BUN 22 (H) 6 - 20 mg/dL   Creatinine, Ser 1.20 (H) 0.44 - 1.00 mg/dL   Glucose, Bld 123 (H) 65 - 99 mg/dL   Calcium, Ion 1.13 (L) 1.15 - 1.40 mmol/L   TCO2 28 22 - 32 mmol/L   Hemoglobin 10.5 (L) 12.0 - 15.0 g/dL   HCT 31.0 (L) 36.0 - 46.0 %  I-stat troponin, ED     Status: None   Collection Time: 11/12/17  6:43 PM  Result Value Ref Range   Troponin i, poc 0.01 0.00 - 0.08 ng/mL   Comment 3            Comment: Due to the release kinetics of cTnI, a negative result within the first hours of the onset of symptoms does not rule out myocardial infarction with certainty. If myocardial infarction is still suspected, repeat the test at appropriate intervals.    Dg Chest Port 1 View  Result Date: 11/12/2017 CLINICAL DATA:  She was seen at [facility] walk-in clinic today and was found to have low SPO2. Her family are with her and tell me  that pt. Has had hx of these symptoms "but it was worse and she had to be admitted to the hospital--the doctors said it is a fluid build-up".; hx ureter CA; ex smoker EXAM: PORTABLE CHEST 1 VIEW COMPARISON:  Exam performed earlier today. FINDINGS: Cardiac silhouette is mildly enlarged. No mediastinal or hilar masses. There prominent bronchovascular markings. Hazy opacities noted at both lung bases consistent with pleural effusions with associated atelectasis. No pneumothorax. Skeletal structures are demineralized but grossly intact. IMPRESSION: Findings consistent with mild congestive heart failure without significant change from the earlier exam. Electronically Signed   By: Lajean Manes M.D.   On: 11/12/2017 18:18    Pending Labs [employer] (From admission, onward)   Start     Ordered   Signed and Held  Comprehensive metabolic panel  Tomorrow morning,   R     Signed and Held   Signed and Held  CBC  Tomorrow morning,   R     Signed and Held   Signed and Held  Troponin I (q 6hr x 3)  Now then every 6 hours,   R     Signed and Held      Vitals/Pain Today's Vitals   11/12/17 1957 11/12/17 2000 11/12/17 2030 11/12/17 2100  BP: (!) 113/52 121/67 (!) 135/50 (!) 127/47  Pulse: (!) 59   63  Resp: 20 (!) 21 18 (!) 23  Temp:      TempSrc:      SpO2: 98% 97% 96% 97%  PainSc:        Isolation Precautions No active isolations  Medications Medications  apixaban (ELIQUIS) tablet 5 mg (not administered)  furosemide (LASIX) injection 40 mg (40 mg Intravenous Given 11/12/17 2019)    Mobility walks

## 2017-11-12 NOTE — ED Notes (Signed)
Dr. Kim at bedside   

## 2017-11-12 NOTE — ED Notes (Signed)
Respiratory notified about patient. They are en route.

## 2017-11-12 NOTE — ED Provider Notes (Signed)
[facility] DEPT Provider Note   CSN: [id number] Arrival date & time: 11/12/17  1704     History   Chief Complaint Chief Complaint  Patient presents with  . Shortness of Breath    HPI Rebecca Norman is a 84 y.o. female.  She presents for evaluation of generalized weakness, gradually worse for 3 days associated with shortness of breath.  She was seen today at her PCP office, and found to be hypoxic.  She was transferred here by private vehicle, without oxygen supplementation.  Apparently her physician thought she was in congestive heart failure because she has previously had a similar condition, when she was in heart failure.  She is taking her usual diuretic medications.  She is recovering from a ankle fracture 2 months ago and has begun to ambulate more, however the last 3 days have been difficult to walk.  She has been eating well and taking her normal medications.  There are no other known modifying factors.    HPI  Past Medical History:  Diagnosis Date  . DDD (degenerative disc disease), cervical    severe/  auto fusion c2-c5  . Diverticulosis   . Hemorrhoids   . History of adenomatous polyp of colon    1995  adenomatous polyp/  1997 & 2003  hyperplastic polyp's  . History of bladder cancer urologist-  dr [name]   s/p  TURBT's  . History of cancer of ureter   . Hypertension   . Hypothyroid   . Nocturia   . Osteoarthritis   . Osteopenia     Patient Active Problem List   Diagnosis Date Noted  . Fall 08/25/2017  . Closed left ankle fracture 08/25/2017  . Chronic diastolic CHF (congestive heart failure) ([name]) 08/25/2017  . Closed avulsion fracture of medial malleolus of left tibia   . Fall at home   . Current use of long term anticoagulation 08/11/2017  . Atrial fibrillation, chronic ([place]) 08/06/2017  . Hypothyroid 06/13/2017  . Palpitations 06/13/2017  . Abnormal EKG 06/13/2017  . Heart murmur 06/13/2017  . S/P laminectomy  07/10/2013  . HTN (hypertension) 07/10/2013  . BLADDER CANCER 12/17/2008  . DIVERTICULOSIS OF COLON 12/12/2008  . COLONIC POLYPS, HYPERPLASTIC, HX OF 12/12/2008    Past Surgical History:  Procedure Laterality Date  . BACK SURGERY    . CARDIOVERSION N/A 08/09/2017   Procedure: CARDIOVERSION;  Surgeon: Larey Dresser, MD;  Location: [facility] ENDOSCOPY;  Service: Cardiovascular;  Laterality: N/A;  . CATARACT EXTRACTION W/ INTRAOCULAR LENS  IMPLANT, BILATERAL Bilateral   . CYSTOSCOPY W/ RETROGRADES  10/03/2011   Procedure: CYSTOSCOPY WITH RETROGRADE PYELOGRAM;  Surgeon: Molli Hazard, MD;  Location: [facility];  Service: Urology;;  . Consuela Mimes W/ RETROGRADES Bilateral 06/17/2016   Procedure: CYSTOSCOPY WITH RETROGRADE PYELOGRAM;  Surgeon: Alexis Frock, MD;  Location: [facility];  Service: Urology;  Laterality: Bilateral;  . CYSTOSCOPY WITH BIOPSY N/A 06/17/2016   Procedure: CYSTOSCOPY WITH BIOPSY AND FULGERATION;  Surgeon: Alexis Frock, MD;  Location: [facility];  Service: Urology;  Laterality: N/A;  . LAMINECTOMY  2009   L3 - 4  . ORIF ANKLE FRACTURE Left 08/28/2017   Procedure: OPEN REDUCTION INTERNAL FIXATION (ORIF) ANKLE FRACTURE;  Surgeon: Shona Needles, MD;  Location: [facility] ORS;  Service: Orthopedics;  Laterality: Left;  . TRANSURETHRAL RESECTION OF BLADDER  x3 --  2009; 2010; 06-29-2010  . TRANSURETHRAL RESECTION OF BLADDER TUMOR  10/03/2011   Procedure: TRANSURETHRAL  RESECTION OF BLADDER TUMOR (TURBT);  Surgeon: Molli Hazard, MD;  Location: [facility];  Service: Urology;  Laterality: N/A;    OB History    No data available       Home Medications    Prior to Admission medications   Medication Sig Start Date End Date Taking? Authorizing Provider  acetaminophen (TYLENOL) 500 MG tablet Take 500 mg by mouth every 6 (six) hours as needed for headache (pain).     [provider]  amiodarone  (PACERONE) 200 MG tablet Take 1 tablet (200 mg total) by mouth daily. 08/25/17   Almyra Deforest, PA  amLODipine (NORVASC) 2.5 MG tablet Take 1 tablet (2.5 mg total) by mouth daily. 08/23/17 11/21/17  Almyra Deforest, PA  apixaban (ELIQUIS) 5 MG TABS tablet Take 1 tablet (5 mg total) by mouth 2 (two) times daily. 07/26/17   Sherran Needs, NP  furosemide (LASIX) 20 MG tablet Take 1 tablet (20 mg total) by mouth daily. 08/12/17   Strader, Fransisco Hertz, PA-C  levothyroxine (SYNTHROID, LEVOTHROID) 50 MCG tablet Take 50 mcg by mouth daily before breakfast.    [provider]  metoprolol tartrate (LOPRESSOR) 25 MG tablet Take 1 tablet (25 mg total) by mouth 2 (two) times daily. 08/11/17 11/09/17  Strader, Fransisco Hertz, PA-C  oxyCODONE-acetaminophen (PERCOCET/ROXICET) 5-325 MG tablet Take 1 tablet by mouth every 6 (six) hours as needed for moderate pain or severe pain. 09/01/17   Patrecia Pour, MD  polyethylene glycol (MIRALAX / Floria Raveling) packet Take 17 g by mouth daily as needed for mild constipation. 09/01/17   Patrecia Pour, MD  polyvinyl alcohol (ARTIFICIAL TEARS) 1.4 % ophthalmic solution Place 1 drop into both eyes daily as needed for dry eyes (itching).    [provider]  sodium chloride (OCEAN) 0.65 % SOLN nasal spray Place 1 spray into both nostrils daily as needed for congestion.    [provider]    Family History Family History  Problem Relation Age of Onset  . Breast cancer Sister   . Stomach cancer Sister   . Hernia Sister 32       HERNIA SURGERY COMPLICATIONS  . Colon cancer Neg Hx     Social History Social History   Tobacco Use  . Smoking status: Former Smoker    Years: 10.00    Types: Cigarettes    Last attempt to quit: 09/25/2005    Years since quitting: 12.1  . Smokeless tobacco: Never Used  Substance Use Topics  . Alcohol use: Yes  . Drug use: No     Allergies   Flagyl [metronidazole] and Lisinopril   Review of Systems Review of Systems  All other  systems reviewed and are negative.    Physical Exam Updated Vital Signs BP (!) 113/52   Pulse (!) 59   Temp 97.6 F (36.4 C) (Oral)   Resp 20   SpO2 98%   Physical Exam  Constitutional: She is oriented to person, place, and time. She appears well-developed. She does not appear ill.  Elderly, frail  HENT:  Head: Normocephalic and atraumatic.  Eyes: Conjunctivae and EOM are normal. Pupils are equal, round, and reactive to light.  Neck: Normal range of motion and phonation normal. Neck supple.  Cardiovascular: Normal rate and regular rhythm.  Pulmonary/Chest: Effort normal and breath sounds normal. She has no decreased breath sounds. She has no wheezes. She has no rhonchi. She has no rales. She exhibits no tenderness.  Abdominal: Soft.  She exhibits no distension. There is no tenderness. There is no guarding.  Musculoskeletal: Normal range of motion.       Right lower leg: She exhibits edema (2+ bilateral lower leg swelling). She exhibits no tenderness.       Left lower leg: She exhibits edema. She exhibits no tenderness.  Neurological: She is alert and oriented to person, place, and time. She exhibits normal muscle tone.  Skin: Skin is warm and dry. No rash noted.  Psychiatric: She has a normal mood and affect. Her behavior is normal. Judgment and thought content normal.  Nursing note and vitals reviewed.    ED Treatments / Results  Labs (all labs ordered are listed, but only abnormal results are displayed) Labs Reviewed  HEPATIC FUNCTION PANEL - Abnormal; Notable for the following components:      Result Value   Total Protein 6.2 (*)    Albumin 3.2 (*)    All other components within normal limits  BRAIN NATRIURETIC PEPTIDE - Abnormal; Notable for the following components:   B Natriuretic Peptide 871.5 (*)    All other components within normal limits  I-STAT CHEM 8, ED - Abnormal; Notable for the following components:   BUN 22 (*)    Creatinine, Ser 1.20 (*)    Glucose,  Bld 123 (*)    Calcium, Ion 1.13 (*)    Hemoglobin 10.5 (*)    HCT 31.0 (*)    All other components within normal limits  I-STAT TROPONIN, ED    EKG  EKG Interpretation  Date/Time:  Sunday November 12 2017 17:28:00 EDT Ventricular Rate:  64 PR Interval:    QRS Duration: 97 QT Interval:  424 QTC Calculation: 438 R Axis:   43 Text Interpretation:  Sinus rhythm Consider left atrial enlargement Abnormal R-wave progression, early transition Repol abnrm, severe global ischemia (LM/MVD) Since last tracing ischemic abnormality is more prominant  Confirmed by Daleen Bo [phone]) on 11/12/2017 5:35:37 PM       Radiology Dg Chest Port 1 View  Result Date: 11/12/2017 CLINICAL DATA:  She was seen at [facility] walk-in clinic today and was found to have low SPO2. Her family are with her and tell me that pt. Has had hx of these symptoms "but it was worse and she had to be admitted to the hospital--the doctors said it is a fluid build-up".; hx ureter CA; ex smoker EXAM: PORTABLE CHEST 1 VIEW COMPARISON:  Exam performed earlier today. FINDINGS: Cardiac silhouette is mildly enlarged. No mediastinal or hilar masses. There prominent bronchovascular markings. Hazy opacities noted at both lung bases consistent with pleural effusions with associated atelectasis. No pneumothorax. Skeletal structures are demineralized but grossly intact. IMPRESSION: Findings consistent with mild congestive heart failure without significant change from the earlier exam. Electronically Signed   By: Lajean Manes M.D.   On: 11/12/2017 18:18    Procedures Procedures (including critical care time)  Medications Ordered in ED Medications  furosemide (LASIX) injection 40 mg (not administered)     Initial Impression / Assessment and Plan / ED Course  I have reviewed the triage vital signs and the nursing notes.  Pertinent labs & imaging results that were available during my care of the patient were reviewed by me and considered in  my medical decision making (see chart for details).  Clinical Course as of Nov 12 2013  Sun Nov 12, 2017  2012 Evaluation for shortness of breath with hypoxia indicates congestive heart failure; BNP elevated, chest x-ray consistent with  fluid overload.  Total protein low.  Chemistry panel [place] for elevated BUN and creatinine.  Ionized calcium low 1.1 hemoglobin low 10.5.   [EW]    Clinical Course User Index [EW] Daleen Bo, MD     Patient Vitals for the past 24 hrs:  BP Temp Temp src Pulse Resp SpO2  11/12/17 1957 (!) 113/52 - - (!) 59 20 98 %  11/12/17 1930 (!) 113/52 - - - 18 97 %  11/12/17 1807 (!) 130/47 - - 63 (!) 27 98 %  11/12/17 1713 (!) 117/47 97.6 F (36.4 C) Oral 69 20 (!) 79 %    8:09 PM Reevaluation with update and discussion. After initial assessment and treatment, an updated evaluation reveals she is comfortable at this time and has no further complaints.  She remains on nasal cannula oxygen at 4 L with a saturation of 97%, normal.  Findings discussed with patient and daughter, all questions answered. Daleen Bo    8:15 PM-Consult complete with hospitalist. Patient case explained and discussed.  He agrees to admit patient for further evaluation and treatment. Call ended at 8:28 PM  Final Clinical Impressions(s) / ED Diagnoses   Final diagnoses:  Hypoxia  Acute on chronic congestive heart failure, unspecified heart failure type ([place])   Shortness of breath with malaise, hypoxia and sign for mild congestive heart failure.  Patient requires oxygen supplementation at this time therefore cannot be discharged.  Recent cardiac echo, October 2018 did not show altered cardiac function.  Congestive heart failure is likely multifactorial at this time.  Nursing Notes Reviewed/ Care Coordinated Applicable Imaging Reviewed Interpretation of Laboratory Data incorporated into ED treatment   Plan: [place]  ED Discharge Orders    None       Daleen Bo,  MD 11/12/17 2031

## 2017-11-13 ENCOUNTER — Other Ambulatory Visit (HOSPITAL_COMMUNITY): Payer: Medicare Other

## 2017-11-13 DIAGNOSIS — J9601 Acute respiratory failure with hypoxia: Secondary | ICD-10-CM

## 2017-11-13 DIAGNOSIS — R001 Bradycardia, unspecified: Secondary | ICD-10-CM

## 2017-11-13 LAB — CBC
HCT: 34 % — ABNORMAL LOW (ref 36.0–46.0)
Hemoglobin: 10.9 g/dL — ABNORMAL LOW (ref 12.0–15.0)
MCH: 30.4 pg (ref 26.0–34.0)
MCHC: 32.1 g/dL (ref 30.0–36.0)
MCV: 94.7 fL (ref 78.0–100.0)
PLATELETS: 216 10*3/uL (ref 150–400)
RBC: 3.59 MIL/uL — ABNORMAL LOW (ref 3.87–5.11)
RDW: 15 % (ref 11.5–15.5)
WBC: 6.9 10*3/uL (ref 4.0–10.5)

## 2017-11-13 LAB — TROPONIN I: Troponin I: <0.03 ng/mL (ref <0.03)

## 2017-11-13 LAB — COMPREHENSIVE METABOLIC PANEL
ALBUMIN: 3.3 g/dL — AB (ref 3.5–5.0)
ALT: 12 U/L — AB (ref 14–54)
AST: 15 U/L (ref 15–41)
Alkaline Phosphatase: 72 U/L (ref 38–126)
Anion gap: 7 (ref 5–15)
BILIRUBIN TOTAL: 0.7 mg/dL (ref 0.3–1.2)
BUN: 22 mg/dL — ABNORMAL HIGH (ref 6–20)
CO2: 29 mmol/L (ref 22–32)
CREATININE: 0.95 mg/dL (ref 0.44–1.00)
Calcium: 8.6 mg/dL — ABNORMAL LOW (ref 8.9–10.3)
Chloride: 104 mmol/L (ref 101–111)
GFR calc Af Amer: 59 mL/min — ABNORMAL LOW (ref >60)
GFR, EST NON AFRICAN AMERICAN: 51 mL/min — AB (ref >60)
GLUCOSE: 86 mg/dL (ref 65–99)
Potassium: 3.8 mmol/L (ref 3.5–5.1)
Sodium: 140 mmol/L (ref 135–145)
TOTAL PROTEIN: 6.1 g/dL — AB (ref 6.5–8.1)

## 2017-11-13 LAB — MRSA PCR SCREENING: MRSA by PCR: NEGATIVE

## 2017-11-13 MED ORDER — METOPROLOL TARTRATE 25 MG PO TABS
12.5000 mg | ORAL_TABLET | Freq: Two times a day (BID) | ORAL | Status: DC
  Administered 2017-11-13 (×2): 12.5 mg via ORAL
  Filled 2017-11-13 (×3): qty 1

## 2017-11-13 NOTE — Evaluation (Signed)
Physical Therapy Evaluation Patient Details Name: Rebecca Norman MRN: [id number] DOB: March 11, 1925 Today's Date: 11/13/2017   History of Present Illness  85 yo female admitted with CHF, weaknesss. Hx of DDD, HTn, hypothyroidism, ORIF L ankle 08/2017, osteopenia, A fib. Recent d/c from [place] rehab at SNF-home for ~ 2 weeks prior to admission   Clinical Impression  On eval, pt was Min guard assist for mobility. She walked ~50 feet with a RW. O2 sats 93% on 2L at rest, 87% on 2L during ambulation. Pt presents with general weakness, decreased activity tolerance, and impaired gait and balance. Discussed d/c plan-pt stated she will return home. Recommend HHPT f/u. Could possibly benefit from increased home health aide services, if possible.     Follow Up Recommendations Home health PT;Supervision - Intermittent (Home health Aide)    Equipment Recommendations  None recommended by PT    Recommendations for Other Services       Precautions / Restrictions Precautions Precautions: Fall Precaution Comments: monitor O2 sats Restrictions Weight Bearing Restrictions: No      Mobility  Bed Mobility               General bed mobility comments: oob in recliner  Transfers Overall transfer level: Needs assistance Equipment used: Rolling walker (2 wheeled) Transfers: Sit to/from Stand Sit to Stand: Min guard         General transfer comment: close guard for safety. Increased time.   Ambulation/Gait Ambulation/Gait assistance: Min guard Ambulation Distance (Feet): 50 Feet Assistive device: Rolling walker (2 wheeled) Gait Pattern/deviations: Step-through pattern     General Gait Details: close guard for safety. Slow gait speed. Pt fatigues fairly easily. Dyspnea 2/4. O2 sats 87% on 2L during ambulation  Stairs            Wheelchair Mobility    Modified Rankin (Stroke Patients Only)       Balance Overall balance assessment: Needs assistance;History of Falls          Standing balance support: Bilateral upper extremity supported Standing balance-Leahy Scale: Poor                               Pertinent Vitals/Pain Pain Assessment: No/denies pain    Home Living Family/patient expects to be discharged to:: Private residence Living Arrangements: Alone Available Help at Discharge: Family;Available PRN/intermittently Type of Home: House Home Access: Stairs to enter   Entrance Stairs-Number of Steps: 1 Home Layout: One level Home Equipment: Walker - 2 wheels;Bedside commode;Walker - 4 wheels      Prior Function Level of Independence: Independent with assistive device(s);Needs assistance   Gait / Transfers Assistance Needed: uses RW for ambulation  ADL's / Homemaking Assistance Needed: Aide 2-3x week for household chores, sometimes meals.         Hand Dominance        Extremity/Trunk Assessment   Upper Extremity Assessment Upper Extremity Assessment: Generalized weakness    Lower Extremity Assessment Lower Extremity Assessment: Generalized weakness    Cervical / Trunk Assessment Cervical / Trunk Assessment: Kyphotic  Communication   Communication: HOH  Cognition Arousal/Alertness: Awake/alert Behavior During Therapy: WFL for tasks assessed/performed Overall Cognitive Status: Within Functional Limits for tasks assessed                                        General Comments  Exercises     Assessment/Plan    PT Assessment Patient needs continued PT services  PT Problem List Decreased strength;Decreased balance;Decreased mobility;Decreased activity tolerance       PT Treatment Interventions DME instruction;Gait training;Functional mobility training;Therapeutic activities;Balance training;Patient/family education;Therapeutic exercise    PT Goals (Current goals can be found in the Care Plan section)  Acute Rehab PT Goals Patient Stated Goal: home PT Goal Formulation: With patient Time  For Goal Achievement: 11/27/17 Potential to Achieve Goals: Good    Frequency Min 3X/week   Barriers to discharge        Co-evaluation               AM-PAC PT "6 Clicks" Daily Activity  Outcome Measure Difficulty turning over in bed (including adjusting bedclothes, sheets and blankets)?: A Little Difficulty moving from lying on back to sitting on the side of the bed? : A Little Difficulty sitting down on and standing up from a chair with arms (e.g., wheelchair, bedside commode, etc,.)?: Unable Help needed moving to and from a bed to chair (including a wheelchair)?: A Little Help needed walking in hospital room?: A Little Help needed climbing 3-5 steps with a railing? : A Lot 6 Click Score: 15    End of Session Equipment Utilized During Treatment: Gait belt;Oxygen Activity Tolerance: Patient tolerated treatment well Patient left: in chair;with call bell/phone within reach   PT Visit Diagnosis: Muscle weakness (generalized) (M62.81);Difficulty in walking, not elsewhere classified (R26.2)    Time: 0277-4128 PT Time Calculation (min) (ACUTE ONLY): 25 min   Charges:   PT Evaluation $PT Eval Moderate Complexity: 1 Mod PT Treatments $Gait Training: 8-22 mins   PT G Codes:         Weston Anna, MPT Pager: [phone]

## 2017-11-13 NOTE — Consult Note (Addendum)
Cardiology Consultation:   Patient ID: Rebecca Norman; [id number]; 11-15-1924   Admit date: 11/12/2017 Date of Consult: 11/13/2017  Primary Care Provider: [name], MD Primary Cardiologist: Dr [name] Primary Electrophysiologist:  [facility]   Patient Profile:   Rebecca Norman is a 84 y.o. female with a hx of PAF who is being seen today for the evaluation of CHF at the request of Dr Erlinda Hong.  History of Present Illness:   Rebecca Norman is a pleasant 84 y/o female, lives alone, her daughter is an Immunologist at [employer]. Cardiology saw the pt in Sept 2018 for palpitations. An event monitor was placed and revealed PAF. She was referred to the AF clinic. Metoprolol and Eliquis were added. Her rate improved but she required subsequent increases in her metoprolol dose through Nov. Echo in Oct 2018 showed preserved LVF with mild LVH. Despite finally obtaining rate control she presented Dec 2nd with CHF. She was admitted diuresed and cardioverted 08/22/17. Post cardioversion she had runs of PAT and Amiodarone was added and metoprolol was stopped. She was seen back in the office 08/23/17. Her rate was slow-50- and her Amiodarone was decreased to 200 mg daily. Three days later she fell at home and broke her ankle. She was admitted from 12/21-12/28/18 (surgery on 08/28/17). She was discharged to SNF where she remained for 4 weeks.   She did pretty well at home after that. 3 days ago she noted increasing weakness and cough. This persisted over the weekend and her daughter urged her to go to an urgent care to be evaluated. She was sent from urgent care to [facility] ED 11/12/17 and was found to be in CHF. The pt denied any complaints of SOB, just weakness. She remains in NSR with a rate of 50. Her wgt on admission was 167 lbs- last office wgt was 170-her wgt in December was 165-170.   Past Medical History:  Diagnosis Date  . DDD (degenerative disc disease), cervical    severe/  auto fusion c2-c5  .  Diverticulosis   . Hemorrhoids   . History of adenomatous polyp of colon    1995  adenomatous polyp/  1997 & 2003  hyperplastic polyp's  . History of bladder cancer urologist-  dr [name]   s/p  TURBT's  . History of cancer of ureter   . Hypertension   . Hypothyroid   . Nocturia   . Osteoarthritis   . Osteopenia     Past Surgical History:  Procedure Laterality Date  . BACK SURGERY    . CARDIOVERSION N/A 08/09/2017   Procedure: CARDIOVERSION;  Surgeon: Larey Dresser, MD;  Location: [facility] ENDOSCOPY;  Service: Cardiovascular;  Laterality: N/A;  . CATARACT EXTRACTION W/ INTRAOCULAR LENS  IMPLANT, BILATERAL Bilateral   . CYSTOSCOPY W/ RETROGRADES  10/03/2011   Procedure: CYSTOSCOPY WITH RETROGRADE PYELOGRAM;  Surgeon: Molli Hazard, MD;  Location: [facility];  Service: Urology;;  . Consuela Mimes W/ RETROGRADES Bilateral 06/17/2016   Procedure: CYSTOSCOPY WITH RETROGRADE PYELOGRAM;  Surgeon: Alexis Frock, MD;  Location: [facility];  Service: Urology;  Laterality: Bilateral;  . CYSTOSCOPY WITH BIOPSY N/A 06/17/2016   Procedure: CYSTOSCOPY WITH BIOPSY AND FULGERATION;  Surgeon: Alexis Frock, MD;  Location: [facility];  Service: Urology;  Laterality: N/A;  . LAMINECTOMY  2009   L3 - 4  . ORIF ANKLE FRACTURE Left 08/28/2017   Procedure: OPEN REDUCTION INTERNAL FIXATION (ORIF) ANKLE FRACTURE;  Surgeon: Shona Needles, MD;  Location: [facility] ORS;  Service: Orthopedics;  Laterality: Left;  . TRANSURETHRAL RESECTION OF BLADDER  x3 --  2009; 2010; 06-29-2010  . TRANSURETHRAL RESECTION OF BLADDER TUMOR  10/03/2011   Procedure: TRANSURETHRAL RESECTION OF BLADDER TUMOR (TURBT);  Surgeon: Molli Hazard, MD;  Location: [facility];  Service: Urology;  Laterality: N/A;     Home Medications:  Prior to Admission medications   Medication Sig Start Date End Date Taking? Authorizing Provider  acetaminophen (TYLENOL) 500 MG tablet Take  500 mg by mouth every 6 (six) hours as needed for headache (pain).    Yes [provider]  amiodarone (PACERONE) 200 MG tablet Take 1 tablet (200 mg total) by mouth daily. 08/25/17  Yes Almyra Deforest, PA  amLODipine (NORVASC) 2.5 MG tablet Take 1 tablet (2.5 mg total) by mouth daily. 08/23/17 11/21/17 Yes Almyra Deforest, PA  apixaban (ELIQUIS) 5 MG TABS tablet Take 1 tablet (5 mg total) by mouth 2 (two) times daily. 07/26/17  Yes Sherran Needs, NP  furosemide (LASIX) 20 MG tablet Take 1 tablet (20 mg total) by mouth daily. 08/12/17  Yes Strader, [place] M, PA-C  levothyroxine (SYNTHROID, LEVOTHROID) 50 MCG tablet Take 50 mcg by mouth daily before breakfast.   Yes [provider]  metoprolol tartrate (LOPRESSOR) 25 MG tablet Take 1 tablet (25 mg total) by mouth 2 (two) times daily. 08/11/17 11/12/17 Yes Strader, Fransisco Hertz, PA-C  oxyCODONE-acetaminophen (PERCOCET/ROXICET) 5-325 MG tablet Take 1 tablet by mouth every 6 (six) hours as needed for moderate pain or severe pain. 09/01/17   Patrecia Pour, MD  polyethylene glycol (MIRALAX / Floria Raveling) packet Take 17 g by mouth daily as needed for mild constipation. 09/01/17   Patrecia Pour, MD    Inpatient Medications: Scheduled Meds: . amiodarone  200 mg Oral Daily  . amLODipine  2.5 mg Oral Daily  . apixaban  5 mg Oral BID  . furosemide  40 mg Intravenous Daily  . levothyroxine  50 mcg Oral QAC breakfast  . metoprolol tartrate  25 mg Oral BID  . sodium chloride flush  3 mL Intravenous Q12H   Continuous Infusions: . sodium chloride     PRN Meds: sodium chloride, acetaminophen **OR** acetaminophen, oxyCODONE-acetaminophen, polyethylene glycol, sodium chloride flush  Allergies:    Allergies  Allergen Reactions  . Flagyl [Metronidazole] Hives  . Lisinopril Cough    Social History:   Social History   Socioeconomic History  . Marital status: Widowed    Spouse name: Not on file  . Number of children: Not on file  . Years of  education: Not on file  . Highest education level: Not on file  Social Needs  . Financial resource strain: Not on file  . Food insecurity - worry: Not on file  . Food insecurity - inability: Not on file  . Transportation needs - medical: Not on file  . Transportation needs - non-medical: Not on file  Occupational History  . Occupation: Retired  Tobacco Use  . Smoking status: Former Smoker    Years: 10.00    Types: Cigarettes    Last attempt to quit: 09/25/2005    Years since quitting: 12.1  . Smokeless tobacco: Never Used  Substance and Sexual Activity  . Alcohol use: Yes  . Drug use: No  . Sexual activity: Not on file  Other Topics Concern  . Not on file  Social History Narrative  . Not on file    Family History:  Family History  Problem Relation Age of Onset  . Breast cancer Sister   . Stomach cancer Sister   . Hernia Sister 70       HERNIA SURGERY COMPLICATIONS  . Colon cancer Neg Hx      ROS:  Please see the history of present illness.   All other ROS reviewed and negative.     Physical Exam/Data:   Vitals:   11/12/17 2242 11/12/17 2300 11/13/17 0500 11/13/17 0623  BP: (!) 136/51   (!) 119/48  Pulse: 61   (!) 55  Resp: 16   20  Temp: 97.9 F (36.6 C)   98 F (36.7 C)  TempSrc: Axillary   Oral  SpO2: 97%   95%  Weight:  167 lb 12.3 oz (76.1 kg) 165 lb 9.1 oz (75.1 kg)   Height:  5\' 4"  (1.626 m)      Intake/Output Summary (Last 24 hours) at 11/13/2017 0825 Last data filed at 11/13/2017 3614 Gross per 24 hour  Intake -  Output 1350 ml  Net -1350 ml   Filed Weights   11/12/17 2300 11/13/17 0500  Weight: 167 lb 12.3 oz (76.1 kg) 165 lb 9.1 oz (75.1 kg)   Body mass index is 28.42 kg/m.  General:  Well nourished, elderly female in no acute distress, on O2 HEENT: normal Lymph: no adenopathy Neck: no JVD Endocrine:  No thryomegaly Vascular: No carotid bruits; FA pulses 2+ bilaterally without bruits  Cardiac:  normal S1, S2; RRR; no murmur    Lungs:  clear to auscultation bilaterally, no wheezing, rhonchi or rales  Abd: soft, nontender, no hepatomegaly  Ext: trace edema Musculoskeletal:  No deformities, BUE and BLE strength normal and equal Skin: warm and dry  Neuro:  CNs 2-12 intact, no focal abnormalities noted Psych:  Normal affect   EKG:  The EKG was personally reviewed and demonstrates:  NSR-64, NSST changes secondary to LVH Telemetry:  Telemetry was personally reviewed and demonstrates:  NSR, SB  Relevant CV Studies: Echo 06/29/17- Study Conclusions  - Left ventricle: The cavity size was normal. Wall thickness was   increased in a pattern of mild LVH. Systolic function was normal.   The estimated ejection fraction was in the range of 60% to 65%.   The study is not technically sufficient to allow evaluation of LV   diastolic function. - Aortic valve: There was trivial regurgitation. Valve area (VTI):   2.97 cm^2. Valve area (Vmax): 2.7 cm^2. Valve area (Vmean): 3.18   cm^2. - Mitral valve: Severely calcified annulus. Valve area by pressure   half-time: 2.14 cm^2. Valve area by continuity equation (using   LVOT flow): 3.21 cm^2. - Left atrium: The atrium was moderately to severely dilated. - Atrial septum: No defect or patent foramen ovale was identified.   Laboratory Data:  Chemistry Recent Labs  Lab 11/12/17 1843 11/13/17 0528  NA 139 140  K 4.0 3.8  CL 103 104  CO2  --  29  GLUCOSE 123* 86  BUN 22* 22*  CREATININE 1.20* 0.95  CALCIUM  --  8.6*  GFRNONAA  --  51*  GFRAA  --  59*  ANIONGAP  --  7    Recent Labs  Lab 11/12/17 1747 11/13/17 0528  PROT 6.2* 6.1*  ALBUMIN 3.2* 3.3*  AST 16 15  ALT 21 12*  ALKPHOS 76 72  BILITOT 0.7 0.7   Hematology Recent Labs  Lab 11/12/17 1843 11/13/17 0528  WBC  --  6.9  RBC  --  3.59*  HGB 10.5* 10.9*  HCT 31.0* 34.0*  MCV  --  94.7  MCH  --  30.4  MCHC  --  32.1  RDW  --  15.0  PLT  --  216   Cardiac Enzymes Recent Labs  Lab  11/13/17 0018 11/13/17 0528  TROPONINI <0.03 <0.03    Recent Labs  Lab 11/12/17 1843  TROPIPOC 0.01    BNP Recent Labs  Lab 11/12/17 1748  BNP 871.5*    DDimer No results for input(s): DDIMER in the last 168 hours.  Radiology/Studies:  Dg Chest Port 1 View  Result Date: 11/12/2017 CLINICAL DATA:  She was seen at [facility] walk-in clinic today and was found to have low SPO2. Her family are with her and tell me that pt. Has had hx of these symptoms "but it was worse and she had to be admitted to the hospital--the doctors said it is a fluid build-up".; hx ureter CA; ex smoker EXAM: PORTABLE CHEST 1 VIEW COMPARISON:  Exam performed earlier today. FINDINGS: Cardiac silhouette is mildly enlarged. No mediastinal or hilar masses. There prominent bronchovascular markings. Hazy opacities noted at both lung bases consistent with pleural effusions with associated atelectasis. No pneumothorax. Skeletal structures are demineralized but grossly intact. IMPRESSION: Findings consistent with mild congestive heart failure without significant change from the earlier exam. Electronically Signed   By: Lajean Manes M.D.   On: 11/12/2017 18:18    Assessment and Plan:   Acute CHF- CXR and BNP suggest CHF but her symptoms were weakness. Her wgt is the same as it was in December. She did diurese 1350 since admission.   PAF- She remains in NSR- SB. I wonder if some of her symptoms are from slow rates  HTN- Mild LVH. B/P controlled  Chronic anticoagulation- CHADs VASC=4, on full dose Eliquis  Plan: MD to see. Decrease Lopressor to 12.5 mg BID and hold for HR less than 55.  Check echo. TSH was WNL in Dec. Will follow with you.    For questions or updates, please contact [place] Please consult [email or web address] for contact info under Cardiology/STEMI.   Signed, Kerin Ransom, PA-C  11/13/2017 8:25 AM    History and all data above reviewed.  Patient examined.  I agree with the findings as above.  The  patient was in rehab up until a couple of weeks ago.  She was doing OK but not doing much at home yet.  She did feel more acutely weak over the last couple of days but did not report syncope or presyncope.  She did not have acute SOB.  She does not weigh herself and has some chronic left leg edema since her ankle injury but this is unchanged.  There is objective evidence of excess fluid as above. She has sinus bradycardia.  There has been no evidence of recurrent atrial fib but she has never noticed this symptomatically.  She is in sinus brady now  The patient exam reveals COR:RRR, early peaking systolic murmur out the aortic outflow tract  ,  Lungs: Clear  ,  Abd: Positive bowel sounds, no rebound no guarding, Ext Mild left leg edema  .  All available labs, radiology testing, previous records reviewed. Agree with documented assessment and plan. Weakness:  Reducing the beta blocker but I will have a low threshold to stop this completely before discharge. We will reassess the diuretic dose in the AM.  I do not suspect that she needs particularly aggressive  diuresis.      Jeneen Rinks Rogue Rafalski  11:49 AM  11/13/2017

## 2017-11-13 NOTE — Progress Notes (Signed)
PROGRESS NOTE  Rebecca Norman YCX:[id number] DOB: 17-Nov-1924 DOA: 11/12/2017 PCP: [name], MD  HPI/Recap of past 24 hours:  Sitting in chair, denies chest pain, on 4liter pxygen, denies sob, no cough, report chronic left lower extremity edema since ankle surgery in 08/2017. Has bradycardia on tele  Assessment/Plan: Principal Problem:   Acute CHF (congestive heart failure) (HCC) Active Problems:   HTN (hypertension)   Hypothyroid   Atrial fibrillation, chronic (HCC)   Anemia   CHF (congestive heart failure) (HCC)  Acute hypoxic respiratory failure/likely from acute on chronic diastolic chf -[facility] sob for three days, she was found to be hypoxic. She is sent to ED, in the ED she was found to be hypxic, o2 sats 79%. Though she does not appear in acute distress -cxr on presentation" Findings consistent with mild congestive heart failure without significant change from the earlier exam" -she is started on iv lasix , urine output 1350cc -she reports feeling better, today on exam dose not appear significant volume overloaded, -she ambulated today, 02 dropped to 87% on 2liter after ambulated 50 feet, she report feeling weak but does not appear in significant stress, wean oxygen as able, diuresis per cardiology, repeat echo pending -cardiology consulted , will follow recommendation  PAF with Sinus bradycardia Betablocker dose decreased, continue on amiodarone and apixaban   HTN: bp low normal on current regimen Cardiology adjusting meds  Hypothyroidism Continue synthroid  Left lower leg edema: report from left ankle surgery. She is on apixaban chronically due to afib. Less likely dvt. Does not appear infected.  FTT: she just returned home from short term rehab a few weeks ago, PT recommended home health.   Code Status: full  Family Communication: patient   Disposition Plan: wean oxygen, need cardiology clearance, home with home  health in 1-2 days   Consultants:  cards  Procedures:  none  Antibiotics:  none   Objective: BP (!) 105/50 (BP Location: Right Arm)   Pulse (!) 50   Temp (!) 97 F (36.1 C) (Oral)   Resp 20   Ht 5\' 4"  (1.626 m)   Wt 75.1 kg (165 lb 9.1 oz)   SpO2 94%   BMI 28.42 kg/m   Intake/Output Summary (Last 24 hours) at 11/13/2017 1611 Last data filed at 11/13/2017 1021 Gross per 24 hour  Intake 120 ml  Output 1350 ml  Net -1230 ml   Filed Weights   11/12/17 2300 11/13/17 0500  Weight: 76.1 kg (167 lb 12.3 oz) 75.1 kg (165 lb 9.1 oz)    Exam: Patient is examined daily including today on 11/13/2017, exams remain the same as of yesterday except that has changed    General:  NAD  Cardiovascular: RRR  Respiratory: mild scattered crackles  Abdomen: Soft/ND/NT, positive BS  Musculoskeletal: left ankle/lower leg edema (chronic since after ankle surgery)  Neuro: alert, oriented   Data Reviewed: Basic Metabolic Panel: Recent Labs  Lab 11/12/17 1843 11/13/17 0528  NA 139 140  K 4.0 3.8  CL 103 104  CO2  --  29  GLUCOSE 123* 86  BUN 22* 22*  CREATININE 1.20* 0.95  CALCIUM  --  8.6*   Liver Function Tests: Recent Labs  Lab 11/12/17 1747 11/13/17 0528  AST 16 15  ALT 21 12*  ALKPHOS 76 72  BILITOT 0.7 0.7  PROT 6.2* 6.1*  ALBUMIN 3.2* 3.3*   No results for input(s): LIPASE, AMYLASE in the last  168 hours. No results for input(s): AMMONIA in the last 168 hours. CBC: Recent Labs  Lab 11/12/17 1843 11/13/17 0528  WBC  --  6.9  HGB 10.5* 10.9*  HCT 31.0* 34.0*  MCV  --  94.7  PLT  --  216   Cardiac Enzymes:   Recent Labs  Lab 11/13/17 0018 11/13/17 0528  TROPONINI <0.03 <0.03   BNP (last 3 results) Recent Labs    08/06/17 1500 08/25/17 1912 11/12/17 1748  BNP 1,080.8* 1,571.7* 871.5*    ProBNP (last 3 results) No results for input(s): PROBNP in the last 8760 hours.  CBG: No results for input(s): GLUCAP in the last 168  hours.  Recent Results (from the past 240 hour(s))  MRSA PCR Screening     Status: None   Collection Time: 11/12/17 10:58 PM  Result Value Ref Range Status   MRSA by PCR NEGATIVE NEGATIVE Final    Comment:        The GeneXpert MRSA Assay (FDA approved for NASAL specimens only), is one component of a comprehensive MRSA colonization surveillance program. It is not intended to diagnose MRSA infection nor to guide or monitor treatment for MRSA infections. Performed at [facility], [place] [address], [place], [place] [address]      Studies: Dg Chest Port 1 View  Result Date: 11/12/2017 CLINICAL DATA:  She was seen at [facility] walk-in clinic today and was found to have low SPO2. Her family are with her and tell me that pt. Has had hx of these symptoms "but it was worse and she had to be admitted to the hospital--the doctors said it is a fluid build-up".; hx ureter CA; ex smoker EXAM: PORTABLE CHEST 1 VIEW COMPARISON:  Exam performed earlier today. FINDINGS: Cardiac silhouette is mildly enlarged. No mediastinal or hilar masses. There prominent bronchovascular markings. Hazy opacities noted at both lung bases consistent with pleural effusions with associated atelectasis. No pneumothorax. Skeletal structures are demineralized but grossly intact. IMPRESSION: Findings consistent with mild congestive heart failure without significant change from the earlier exam. Electronically Signed   By: Lajean Manes M.D.   On: 11/12/2017 18:18    Scheduled Meds: . amiodarone  200 mg Oral Daily  . amLODipine  2.5 mg Oral Daily  . apixaban  5 mg Oral BID  . furosemide  40 mg Intravenous Daily  . levothyroxine  50 mcg Oral QAC breakfast  . metoprolol tartrate  12.5 mg Oral BID  . sodium chloride flush  3 mL Intravenous Q12H    Continuous Infusions: . sodium chloride       Time spent: 52mins I have personally reviewed and interpreted on  11/13/2017 daily labs, tele strips, imagings as  discussed above under date review session and assessment and plans.  I reviewed all nursing notes, pharmacy notes, consultant notes,  vitals, pertinent old records  I have discussed plan of care as described above with RN , patient  on 11/13/2017   Florencia Reasons MD, PhD  [facility] Pager [phone]. If 7PM-7AM, please contact night-coverage at [email or web address], password [facility] 11/13/2017, 4:11 PM  LOS: 1 day

## 2017-11-14 ENCOUNTER — Inpatient Hospital Stay (HOSPITAL_COMMUNITY): Payer: Medicare Other

## 2017-11-14 DIAGNOSIS — I340 Nonrheumatic mitral (valve) insufficiency: Secondary | ICD-10-CM

## 2017-11-14 DIAGNOSIS — I5031 Acute diastolic (congestive) heart failure: Secondary | ICD-10-CM

## 2017-11-14 LAB — BASIC METABOLIC PANEL
ANION GAP: 9 (ref 5–15)
BUN: 25 mg/dL — ABNORMAL HIGH (ref 6–20)
CHLORIDE: 101 mmol/L (ref 101–111)
CO2: 29 mmol/L (ref 22–32)
Calcium: 8.6 mg/dL — ABNORMAL LOW (ref 8.9–10.3)
Creatinine, Ser: 0.91 mg/dL (ref 0.44–1.00)
GFR calc Af Amer: >60 mL/min (ref >60)
GFR, EST NON AFRICAN AMERICAN: 54 mL/min — AB (ref >60)
Glucose, Bld: 91 mg/dL (ref 65–99)
POTASSIUM: 3.8 mmol/L (ref 3.5–5.1)
SODIUM: 139 mmol/L (ref 135–145)

## 2017-11-14 LAB — ECHOCARDIOGRAM COMPLETE
Height: 64 in
Weight: 2673.74 oz

## 2017-11-14 LAB — MAGNESIUM: Magnesium: 2 mg/dL (ref 1.7–2.4)

## 2017-11-14 MED ORDER — FUROSEMIDE 40 MG PO TABS
40.0000 mg | ORAL_TABLET | Freq: Every day | ORAL | Status: DC
  Administered 2017-11-15: 40 mg via ORAL
  Filled 2017-11-14: qty 1

## 2017-11-14 NOTE — Progress Notes (Signed)
  Echocardiogram 2D Echocardiogram has been performed.  Darlina Sicilian M 11/14/2017, 10:38 AM

## 2017-11-14 NOTE — Progress Notes (Signed)
PROGRESS NOTE  Rebecca Norman QBH:[id number] DOB: 1924-10-02 DOA: 11/12/2017 PCP: [name], MD  HPI/Recap of past 24 hours:  Bradycardia over night, remain oxygen dependent ( she is not on home o2 prior to admission)  denies sob, no cough, report chronic left lower extremity edema since ankle surgery in 08/2017.   Assessment/Plan: Principal Problem:   Acute CHF (congestive heart failure) (HCC) Active Problems:   HTN (hypertension)   Hypothyroid   Atrial fibrillation, chronic (HCC)   Anemia   CHF (congestive heart failure) (HCC)  Acute hypoxic respiratory failure/likely from acute on chronic diastolic chf -[facility] for sob for three days, she was found to be hypoxic. She is sent to ED, in the ED she was found to be hypxic, o2 sats 79%. Though she does not appear in acute distress -cxr on presentation" Findings consistent with mild congestive heart failure without significant change from the earlier exam" -she is started on iv lasix on admission, lasix changed to oal on 3/12 per cardiology recommendation. -wean oxygen as able, diuresis per cardiology, repeat echo pending -cardiology consulted , will follow recommendation  PAF with Sinus bradycardia Betablocker stopped, continue on amiodarone and apixaban   HTN: bp low normal on current regimen Cardiology adjusting meds  Hypothyroidism Continue synthroid  Left lower leg edema: report from left ankle surgery. She is on apixaban chronically due to afib. Less likely dvt. Does not appear infected.  FTT: she just returned home from short term rehab a few weeks ago, PT recommended home health.   Code Status: full  Family Communication: patient   Disposition Plan: wean oxygen, need cardiology clearance, home with home health in 1-2 days   Consultants:  cards  Procedures:  none  Antibiotics:  none   Objective: BP (!) 132/45 (BP Location: Right Arm)   Pulse 60   Temp  97.9 F (36.6 C) (Oral)   Resp 20   Ht 5\' 4"  (1.626 m)   Wt 75.8 kg (167 lb 1.7 oz)   SpO2 96%   BMI 28.68 kg/m   Intake/Output Summary (Last 24 hours) at 11/14/2017 0955 Last data filed at 11/13/2017 1021 Gross per 24 hour  Intake 120 ml  Output -  Net 120 ml   Filed Weights   11/12/17 2300 11/13/17 0500 11/14/17 0543  Weight: 76.1 kg (167 lb 12.3 oz) 75.1 kg (165 lb 9.1 oz) 75.8 kg (167 lb 1.7 oz)    Exam: Patient is examined daily including today on 11/14/2017, exams remain the same as of yesterday except that has changed    General:  NAD  Cardiovascular: RRR  Respiratory: mild scattered crackles  Abdomen: Soft/ND/NT, positive BS  Musculoskeletal: left ankle/lower leg edema (chronic since after ankle surgery)  Neuro: alert, oriented   Data Reviewed: Basic Metabolic Panel: Recent Labs  Lab 11/12/17 1843 11/13/17 0528 11/14/17 0507  NA 139 140 139  K 4.0 3.8 3.8  CL 103 104 101  CO2  --  29 29  GLUCOSE 123* 86 91  BUN 22* 22* 25*  CREATININE 1.20* 0.95 0.91  CALCIUM  --  8.6* 8.6*  MG  --   --  2.0   Liver Function Tests: Recent Labs  Lab 11/12/17 1747 11/13/17 0528  AST 16 15  ALT 21 12*  ALKPHOS 76 72  BILITOT 0.7 0.7  PROT 6.2* 6.1*  ALBUMIN 3.2* 3.3*   No results for input(s): LIPASE, AMYLASE in the last 168 hours.  No results for input(s): AMMONIA in the last 168 hours. CBC: Recent Labs  Lab 11/12/17 1843 11/13/17 0528  WBC  --  6.9  HGB 10.5* 10.9*  HCT 31.0* 34.0*  MCV  --  94.7  PLT  --  216   Cardiac Enzymes:   Recent Labs  Lab 11/13/17 0018 11/13/17 0528  TROPONINI <0.03 <0.03   BNP (last 3 results) Recent Labs    08/06/17 1500 08/25/17 1912 11/12/17 1748  BNP 1,080.8* 1,571.7* 871.5*    ProBNP (last 3 results) No results for input(s): PROBNP in the last 8760 hours.  CBG: No results for input(s): GLUCAP in the last 168 hours.  Recent Results (from the past 240 hour(s))  MRSA PCR Screening     Status: None    Collection Time: 11/12/17 10:58 PM  Result Value Ref Range Status   MRSA by PCR NEGATIVE NEGATIVE Final    Comment:        The GeneXpert MRSA Assay (FDA approved for NASAL specimens only), is one component of a comprehensive MRSA colonization surveillance program. It is not intended to diagnose MRSA infection nor to guide or monitor treatment for MRSA infections. Performed at [facility], [place] [address] [place], [place] [address]      Studies: No results found.  Scheduled Meds: . amiodarone  200 mg Oral Daily  . amLODipine  2.5 mg Oral Daily  . apixaban  5 mg Oral BID  . furosemide  40 mg Intravenous Daily  . levothyroxine  50 mcg Oral QAC breakfast  . sodium chloride flush  3 mL Intravenous Q12H    Continuous Infusions: . sodium chloride       Time spent: 86mins I have personally reviewed and interpreted on  11/14/2017 daily labs, tele strips, imagings as discussed above under date review session and assessment and plans.  I reviewed all nursing notes, pharmacy notes, consultant notes,  vitals,   I have discussed plan of care as described above with RN , patient  on 11/14/2017   Florencia Reasons MD, PhD  [facility] Pager [phone]. If 7PM-7AM, please contact night-coverage at [email or web address], password [facility] 11/14/2017, 9:55 AM  LOS: 2 days

## 2017-11-14 NOTE — Progress Notes (Addendum)
Progress Note  Patient Name: Rebecca Norman Date of Encounter: 11/14/2017  Primary Cardiologist: Kirk Ruths, MD   Subjective   Up in chair, unable to wean off O2 completely yesterday.   Inpatient Medications    Scheduled Meds: . amiodarone  200 mg Oral Daily  . amLODipine  2.5 mg Oral Daily  . apixaban  5 mg Oral BID  . furosemide  40 mg Intravenous Daily  . levothyroxine  50 mcg Oral QAC breakfast  . metoprolol tartrate  12.5 mg Oral BID  . sodium chloride flush  3 mL Intravenous Q12H   Continuous Infusions: . sodium chloride     PRN Meds: sodium chloride, acetaminophen **OR** acetaminophen, oxyCODONE-acetaminophen, polyethylene glycol, sodium chloride flush   Vital Signs    Vitals:   11/13/17 0953 11/13/17 1414 11/13/17 2146 11/14/17 0543  BP:  (!) 105/50 (!) 106/49 (!) 105/44  Pulse: (!) 56 (!) 50 60 (!) 52  Resp:   20 20  Temp:  (!) 97 F (36.1 C) (!) 97.5 F (36.4 C) 97.9 F (36.6 C)  TempSrc:  Oral Oral Oral  SpO2: 98% 94% 93% 92%  Weight:    167 lb 1.7 oz (75.8 kg)  Height:        Intake/Output Summary (Last 24 hours) at 11/14/2017 0850 Last data filed at 11/13/2017 1021 Gross per 24 hour  Intake 120 ml  Output -  Net 120 ml   Filed Weights   11/12/17 2300 11/13/17 0500 11/14/17 0543  Weight: 167 lb 12.3 oz (76.1 kg) 165 lb 9.1 oz (75.1 kg) 167 lb 1.7 oz (75.8 kg)    Telemetry    NSR, SB 50's occasional PVC - Personally Reviewed  ECG    None new - Personally Reviewed  Physical Exam   GEN: No acute distress.   Neck: No JVD Cardiac: RRR, no murmurs, rubs, or gallops.  Respiratory: Clear to auscultation bilaterally. GI: Soft, nontender, non-distended  MS: No edema; No deformity. Neuro:  Nonfocal  Psych: Normal affect   Labs    Chemistry Recent Labs  Lab 11/12/17 1747 11/12/17 1843 11/13/17 0528 11/14/17 0507  NA  --  139 140 139  K  --  4.0 3.8 3.8  CL  --  103 104 101  CO2  --   --  29 29  GLUCOSE  --  123* 86 91    BUN  --  22* 22* 25*  CREATININE  --  1.20* 0.95 0.91  CALCIUM  --   --  8.6* 8.6*  PROT 6.2*  --  6.1*  --   ALBUMIN 3.2*  --  3.3*  --   AST 16  --  15  --   ALT 21  --  12*  --   ALKPHOS 76  --  72  --   BILITOT 0.7  --  0.7  --   GFRNONAA  --   --  51* 54*  GFRAA  --   --  59* >60  ANIONGAP  --   --  7 9     Hematology Recent Labs  Lab 11/12/17 1843 11/13/17 0528  WBC  --  6.9  RBC  --  3.59*  HGB 10.5* 10.9*  HCT 31.0* 34.0*  MCV  --  94.7  MCH  --  30.4  MCHC  --  32.1  RDW  --  15.0  PLT  --  216    Cardiac Enzymes Recent Labs  Lab 11/13/17 0018  11/13/17 0528  TROPONINI <0.03 <0.03    Recent Labs  Lab 11/12/17 1843  TROPIPOC 0.01     BNP Recent Labs  Lab 11/12/17 1748  BNP 871.5*     DDimer No results for input(s): DDIMER in the last 168 hours.   Radiology    Dg Chest Port 1 View  Result Date: 11/12/2017 CLINICAL DATA:  She was seen at [facility] walk-in clinic today and was found to have low SPO2. Her family are with her and tell me that pt. Has had hx of these symptoms "but it was worse and she had to be admitted to the hospital--the doctors said it is a fluid build-up".; hx ureter CA; ex smoker EXAM: PORTABLE CHEST 1 VIEW COMPARISON:  Exam performed earlier today. FINDINGS: Cardiac silhouette is mildly enlarged. No mediastinal or hilar masses. There prominent bronchovascular markings. Hazy opacities noted at both lung bases consistent with pleural effusions with associated atelectasis. No pneumothorax. Skeletal structures are demineralized but grossly intact. IMPRESSION: Findings consistent with mild congestive heart failure without significant change from the earlier exam. Electronically Signed   By: Lajean Manes M.D.   On: 11/12/2017 18:18    Cardiac Studies     Patient Profile     84 y.o. female with CHF and PAF diagnosed this past fall. She is admitted now with CHF by BNP and CXR though her only complaint was weakness. She has been  bradycardic with HR in the 50's. LVF was preserved by echo Oct 2018.   Assessment & Plan    Acute CHF- CXR and BNP suggest CHF but her symptoms were weakness. Her wgt is the same as it was in December. She did diurese 1230 since admission, wgt is unchanged.  PAF- She remains in NSR- SB. I wonder if some of her symptoms are from slow rates  HTN- Mild LVH. B/P controlled  Chronic anticoagulation- CHADs VASC=4, on full dose Eliquis  Plan: Continue IV Lasix 40 mg daily-consider giving her BID for a few doses, stop Lopressor for now.   For questions or updates, please contact [place] Please consult [email or web address] for contact info under Cardiology/STEMI.      Signed, Kerin Ransom, PA-C  11/14/2017, 8:50 AM    History and all data above reviewed.  Patient examined.  I agree with the findings as above.  Echo done this morning and I did look at the images personally.  EF well preserved.  Final report pending. She is weak but maybe slightly better than previous.  The patient exam reveals COR:RRR  ,  Lungs: Clear  ,  Abd: Positive bowel sounds, no rebound no guarding, Ext No edema  .  All available labs, radiology testing, previous records reviewed. Agree with documented assessment and plan. I think that we can change her to PO diuretic.  Agree with stopping the beta blocker altogether.   Jeneen Rinks Joel Cowin  11:31 AM  11/14/2017

## 2017-11-14 NOTE — Progress Notes (Signed)
Physical Therapy Treatment Patient Details Name: Rebecca Norman MRN: [id number] DOB: [birth year] Today's Date: 11/14/2017       History of Present Illness 84 yo female admitted with CHF, weaknesss. Hx of DDD, HTn, hypothyroidism, ORIF L ankle 08/2017, osteopenia, A fib    PT Comments    O2 sats dropped to 86% on RA while pt was donning her socks. O2 sats were 88% while ambulating on 2L [place]. Pt still appears weak. She fatigues fairly easily with activity. Discussed d/c plan again on today. She does not wish to return to SNF. She does understand she may need to consider another short stay in rehab. Will continue to folllw.    Follow Up Recommendations  Home health PT(pt does not wish to return to a SNF)     Equipment Recommendations  None recommended by PT    Recommendations for Other Services       Precautions / Restrictions Precautions Precautions: Fall Precaution Comments: monitor O2 sats Restrictions Weight Bearing Restrictions: No    Mobility  Bed Mobility               General bed mobility comments: oob in recliner  Transfers Overall transfer level: Needs assistance Equipment used: Rolling walker (2 wheeled) Transfers: Sit to/from Stand Sit to Stand: Min guard         General transfer comment: close guard for safety. Increased time.   Ambulation/Gait Ambulation/Gait assistance: Min guard Ambulation Distance (Feet): 60 Feet Assistive device: Rolling walker (2 wheeled) Gait Pattern/deviations: Step-through pattern     General Gait Details: close guard for safety. Slow gait speed. Pt fatigues fairly easily. Dyspnea 2/4. O2 sats 88% on 2L during ambulation   Stairs            Wheelchair Mobility    Modified Rankin (Stroke Patients Only)       Balance                                            Cognition Arousal/Alertness: Awake/alert Behavior During Therapy: WFL for tasks assessed/performed Overall Cognitive Status:  Within Functional Limits for tasks assessed                                        Exercises      General Comments        Pertinent Vitals/Pain Pain Assessment: No/denies pain    Home Living                      Prior Function            PT Goals (current goals can now be found in the care plan section) Progress towards PT goals: Progressing toward goals    Frequency    Min 3X/week      PT Plan Current plan remains appropriate    Co-evaluation              AM-PAC PT "6 Clicks" Daily Activity  Outcome Measure  Difficulty turning over in bed (including adjusting bedclothes, sheets and blankets)?: A Little Difficulty moving from lying on back to sitting on the side of the bed? : A Little Difficulty sitting down on and standing up from a chair with arms (e.g., wheelchair, bedside commode, etc,.)?: A Little Help  needed moving to and from a bed to chair (including a wheelchair)?: A Little Help needed walking in hospital room?: A Little Help needed climbing 3-5 steps with a railing? : A Little 6 Click Score: 18    End of Session Equipment Utilized During Treatment: Gait belt;Oxygen Activity Tolerance: Patient tolerated treatment well Patient left: in chair;with call bell/phone within reach   PT Visit Diagnosis: Muscle weakness (generalized) (M62.81);Difficulty in walking, not elsewhere classified (R26.2)     Time: 2897-9150 PT Time Calculation (min) (ACUTE ONLY): 26 min  Charges:  $Gait Training: 23-37 mins                    G Codes:          Weston Anna, MPT Pager: [phone]

## 2017-11-15 DIAGNOSIS — I10 Essential (primary) hypertension: Secondary | ICD-10-CM

## 2017-11-15 DIAGNOSIS — I509 Heart failure, unspecified: Secondary | ICD-10-CM

## 2017-11-15 DIAGNOSIS — N179 Acute kidney failure, unspecified: Secondary | ICD-10-CM

## 2017-11-15 DIAGNOSIS — N19 Unspecified kidney failure: Secondary | ICD-10-CM

## 2017-11-15 DIAGNOSIS — I1310 Hypertensive heart and chronic kidney disease without heart failure, with stage 1 through stage 4 chronic kidney disease, or unspecified chronic kidney disease: Secondary | ICD-10-CM

## 2017-11-15 LAB — CBC WITH DIFFERENTIAL/PLATELET
Basophils Absolute: 0 10*3/uL (ref 0.0–0.1)
Basophils Relative: 0 %
Eosinophils Absolute: 0.2 10*3/uL (ref 0.0–0.7)
Eosinophils Relative: 3 %
HCT: 33.6 % — ABNORMAL LOW (ref 36.0–46.0)
Hemoglobin: 10.8 g/dL — ABNORMAL LOW (ref 12.0–15.0)
LYMPHS PCT: 18 %
Lymphs Abs: 1.2 10*3/uL (ref 0.7–4.0)
MCH: 30.3 pg (ref 26.0–34.0)
MCHC: 32.1 g/dL (ref 30.0–36.0)
MCV: 94.4 fL (ref 78.0–100.0)
MONO ABS: 0.5 10*3/uL (ref 0.1–1.0)
MONOS PCT: 8 %
NEUTROS ABS: 4.7 10*3/uL (ref 1.7–7.7)
Neutrophils Relative %: 71 %
Platelets: 234 10*3/uL (ref 150–400)
RBC: 3.56 MIL/uL — ABNORMAL LOW (ref 3.87–5.11)
RDW: 14.8 % (ref 11.5–15.5)
WBC: 6.6 10*3/uL (ref 4.0–10.5)

## 2017-11-15 LAB — BASIC METABOLIC PANEL
Anion gap: 9 (ref 5–15)
BUN: 29 mg/dL — ABNORMAL HIGH (ref 6–20)
CO2: 27 mmol/L (ref 22–32)
Calcium: 8.7 mg/dL — ABNORMAL LOW (ref 8.9–10.3)
Chloride: 102 mmol/L (ref 101–111)
Creatinine, Ser: 0.82 mg/dL (ref 0.44–1.00)
GFR calc Af Amer: >60 mL/min (ref >60)
GFR calc non Af Amer: >60 mL/min (ref >60)
Glucose, Bld: 87 mg/dL (ref 65–99)
POTASSIUM: 4 mmol/L (ref 3.5–5.1)
Sodium: 138 mmol/L (ref 135–145)

## 2017-11-15 LAB — FERRITIN: FERRITIN: 128 ng/mL (ref 11–307)

## 2017-11-15 LAB — FOLATE: Folate: 9.9 ng/mL (ref >5.9)

## 2017-11-15 LAB — IRON AND TIBC
IRON: 43 ug/dL (ref 28–170)
Saturation Ratios: 14 % (ref 10.4–31.8)
TIBC: 315 ug/dL (ref 250–450)
UIBC: 272 ug/dL

## 2017-11-15 LAB — VITAMIN B12: Vitamin B-12: 184 pg/mL (ref 180–914)

## 2017-11-15 LAB — RETICULOCYTES
RBC.: 3.55 MIL/uL — ABNORMAL LOW (ref 3.87–5.11)
Retic Count, Absolute: 145.6 10*3/uL (ref 19.0–186.0)
Retic Ct Pct: 4.1 % — ABNORMAL HIGH (ref 0.4–3.1)

## 2017-11-15 NOTE — Progress Notes (Addendum)
Physical Therapy Treatment Patient Details Name: Rebecca Norman MRN: [id number] DOB: [birth year] Today's Date: 11/15/2017    History of Present Illness 84 yo female admitted with CHF, weaknesss. Hx of DDD, HTn, hypothyroidism, ORIF L ankle 08/2017, osteopenia, A fib    PT Comments    Pt continues to participate well. O2 sats: 93% on RA at rest, 85% on RA during ambulation. Will continue to follow. Pt prefers to d/c home.    Follow Up Recommendations  Home health PT(pt does not wish to return to SNF)     Equipment Recommendations  None recommended by PT    Recommendations for Other Services       Precautions / Restrictions Precautions Precautions: Fall Precaution Comments: monitor O2 sats Restrictions Weight Bearing Restrictions: No    Mobility  Bed Mobility               General bed mobility comments: oob in recliner  Transfers Overall transfer level: Needs assistance Equipment used: Rolling walker (2 wheeled) Transfers: Sit to/from Stand Sit to Stand: Min guard         General transfer comment: close guard for safety. Increased time.   Ambulation/Gait Ambulation/Gait assistance: Min guard Ambulation Distance (Feet): 100 Feet Assistive device: Rolling walker (2 wheeled) Gait Pattern/deviations: Step-through pattern     General Gait Details: close guard for safety. Slow gait speed. Sats dropped to 85% on RA.    Stairs            Wheelchair Mobility    Modified Rankin (Stroke Patients Only)       Balance                                            Cognition Arousal/Alertness: Awake/alert Behavior During Therapy: WFL for tasks assessed/performed Overall Cognitive Status: Within Functional Limits for tasks assessed                                        Exercises  Long Arc Quads, 20 reps, both, active Ankle pumps, 20, both, active    General Comments        Pertinent Vitals/Pain Pain  Assessment: No/denies pain    Home Living                      Prior Function            PT Goals (current goals can now be found in the care plan section) Progress towards PT goals: Progressing toward goals    Frequency    Min 3X/week      PT Plan Current plan remains appropriate    Co-evaluation              AM-PAC PT "6 Clicks" Daily Activity  Outcome Measure  Difficulty turning over in bed (including adjusting bedclothes, sheets and blankets)?: A Little Difficulty moving from lying on back to sitting on the side of the bed? : A Little Difficulty sitting down on and standing up from a chair with arms (e.g., wheelchair, bedside commode, etc,.)?: A Little Help needed moving to and from a bed to chair (including a wheelchair)?: A Little Help needed walking in hospital room?: A Little Help needed climbing 3-5 steps with a railing? : A Little 6 Click Score:  18    End of Session   Activity Tolerance: Patient tolerated treatment well Patient left: in chair;with call bell/phone within reach   PT Visit Diagnosis: Muscle weakness (generalized) (M62.81);Difficulty in walking, not elsewhere classified (R26.2)     Time: 6226-3335 PT Time Calculation (min) (ACUTE ONLY): 19 min  Charges:  $Gait Training: 8-22 mins                    G Codes:          Weston Anna, MPT Pager: [phone]

## 2017-11-15 NOTE — Progress Notes (Addendum)
Progress Note  Patient Name: Rebecca Norman Date of Encounter: 11/15/2017  Primary Cardiologist: Kirk Ruths, MD   Subjective   Up in chair, she is now off O2 but still has reported hypoxia with activity  Inpatient Medications    Scheduled Meds: . amiodarone  200 mg Oral Daily  . amLODipine  2.5 mg Oral Daily  . apixaban  5 mg Oral BID  . furosemide  40 mg Oral Daily  . levothyroxine  50 mcg Oral QAC breakfast  . sodium chloride flush  3 mL Intravenous Q12H   Continuous Infusions: . sodium chloride     PRN Meds: sodium chloride, acetaminophen **OR** acetaminophen, oxyCODONE-acetaminophen, polyethylene glycol, sodium chloride flush   Vital Signs    Vitals:   11/14/17 1509 11/14/17 2228 11/15/17 0547 11/15/17 0636  BP: (!) 112/48 (!) 115/57  109/78  Pulse: (!) 57 67  62  Resp:  18  18  Temp: (!) 97.5 F (36.4 C) (!) 97.5 F (36.4 C)  97.8 F (36.6 C)  TempSrc: Oral Oral  Oral  SpO2: 95% 100%  94%  Weight:   163 lb 14.4 oz (74.3 kg)   Height:        Intake/Output Summary (Last 24 hours) at 11/15/2017 1115 Last data filed at 11/15/2017 0936 Gross per 24 hour  Intake 660 ml  Output -  Net 660 ml   Filed Weights   11/13/17 0500 11/14/17 0543 11/15/17 0547  Weight: 165 lb 9.1 oz (75.1 kg) 167 lb 1.7 oz (75.8 kg) 163 lb 14.4 oz (74.3 kg)    Telemetry    NSR, SB 68  - Personally Reviewed  ECG    None new - Personally Reviewed  Physical Exam   GEN: No acute distress.   Neck: No JVD Cardiac: RRR, no murmurs, rubs, or gallops.  Respiratory: decreased breath sounds, no rales, no wheezing GI: Soft, nontender, non-distended  MS: No edema; No deformity. Neuro:  Nonfocal  Psych: Normal affect   Labs    Chemistry Recent Labs  Lab 11/12/17 1747  11/13/17 0528 11/14/17 0507 11/15/17 0519  NA  --    < > 140 139 138  K  --    < > 3.8 3.8 4.0  CL  --    < > 104 101 102  CO2  --   --  29 29 27   GLUCOSE  --    < > 86 91 87  BUN  --    < > 22* 25*  29*  CREATININE  --    < > 0.95 0.91 0.82  CALCIUM  --   --  8.6* 8.6* 8.7*  PROT 6.2*  --  6.1*  --   --   ALBUMIN 3.2*  --  3.3*  --   --   AST 16  --  15  --   --   ALT 21  --  12*  --   --   ALKPHOS 76  --  72  --   --   BILITOT 0.7  --  0.7  --   --   GFRNONAA  --   --  51* 54* >60  GFRAA  --   --  59* >60 >60  ANIONGAP  --   --  7 9 9    < > = values in this interval not displayed.     Hematology Recent Labs  Lab 11/12/17 1843 11/13/17 0528 11/15/17 0519  WBC  --  6.9 6.6  RBC  --  3.59* 3.56*  3.55*  HGB 10.5* 10.9* 10.8*  HCT 31.0* 34.0* 33.6*  MCV  --  94.7 94.4  MCH  --  30.4 30.3  MCHC  --  32.1 32.1  RDW  --  15.0 14.8  PLT  --  216 234    Cardiac Enzymes Recent Labs  Lab 11/13/17 0018 11/13/17 0528  TROPONINI <0.03 <0.03    Recent Labs  Lab 11/12/17 1843  TROPIPOC 0.01     BNP Recent Labs  Lab 11/12/17 1748  BNP 871.5*     DDimer No results for input(s): DDIMER in the last 168 hours.   Radiology    No results found.  Cardiac Studies     Patient Profile     84 y.o. female with CHF and PAF diagnosed this past fall. She is admitted now with CHF by BNP and CXR though her only complaint was weakness. She has been bradycardic with HR in the 50's. LVF was preserved by echo Oct 2018.   Assessment & Plan    Acute CHF- CXR and BNP suggest CHF but her symptoms were weakness. Her wgt is down 4 lbs.  PAF- She remains in NSR- rate is better off beta blocker.  HTN- Mild LVH. B/P controlled  Chronic anticoagulation- CHADs VASC=4, on full dose Eliquis  Plan: Consider decreasing Lasix to her home dose of 20 mg at discharge. Continue Amiodarone 200 mg daily and Eliquis. We will arrange follow up with Dr [name]. We will see again as needed.   For questions or updates, please contact [place] Please consult [email or web address] for contact info under Cardiology/STEMI.      Signed, Kerin Ransom, PA-C  11/15/2017, 11:15 AM    History  and all data above reviewed.  Patient examined.  I agree with the findings as above.  She feels slightly better and she did ambulate in the hallway.  Heart rate improved off of beta blocker. The patient exam reveals COR:RRR  ,  Lungs: Clear  ,  Abd: Positive bowel sounds, no rebound no guarding, Ext No edema  .  All available labs, radiology testing, previous records reviewed. Agree with documented assessment and plan.   Weakness:    Discontinued the beta blocker.  Change to 20 mg PO Lasix at discharge.  No further suggestions.  Please call with questions.    Jeneen Rinks Cara Thaxton  5:29 PM  11/15/2017

## 2017-11-15 NOTE — Care Management Note (Signed)
Case Management Note  Patient Details  Name: Rebecca Norman MRN: [id number] Date of Birth: 1924-11-16  Subjective/Objective: Confirmed w/[facility] rep Lisa-Active w/HHPT/OT/aide-patient agrees to resume HHC. Noted may need home 02-will monitor-can arrange w/documented 02 sats, & home 02 order.                    Action/Plan:d/c home w/HHC.   Expected Discharge Date:  11/15/17               Expected Discharge Plan:  [place]  In-House Referral:     Discharge planning Services  CM Consult  Post Acute Care Choice:  Home Health(Active w/[facility]-HHPT/OT/aide;has rw) Choice offered to:  Patient  DME Arranged:    DME Agency:     HH Arranged:  PT, OT, Nurse's Aide [place] Agency:  [facility] (formerly [employer])  Status of Service:  In process, will continue to follow  If discussed at Long Length of Stay Meetings, dates discussed:    Additional Comments:  [name], RN 11/15/2017, 3:35 PM

## 2017-11-15 NOTE — Care Management Important Message (Signed)
Important Message  Patient Details  Name: ADRIENNA KARIS MRN: [id number] Date of Birth: 19-Feb-1925   Medicare Important Message Given:  Yes    Kerin Salen 11/15/2017, 12:17 [place] Message  Patient Details  Name: AINE STRYCHARZ MRN: [id number] Date of Birth: Oct 20, 1924   Medicare Important Message Given:  Yes    Kerin Salen 11/15/2017, 12:17 PM

## 2017-11-15 NOTE — Progress Notes (Signed)
[facility] PROGRESS NOTE    Progress Note  Rebecca Norman  ZOX:[id number] DOB: 05-Jul-1925 DOA: 11/12/2017 PCP: [name], MD     Brief Narrative:   Rebecca Norman is an 84 y.o. female past medical history of essential hypertension, essential hypertension with an EF of 65%, and blood parameters consistent with diastolic heart failure, aortic valve show mild stenosis with a gradient of 11 presents to the ED with dyspnea  Assessment/Plan:   Acute CHF (congestive heart failure) ([place]) Estimated dry weight 74-75 kg, on admission 76.1. Cardiology was consulted who recommended IV diuresis admission. Strict I's and O's poorly recorded weight has improved to 73.3 kg Physical therapy was consulted recommended home health PT. Today she has been a change her Lasix to oral, can probably be discharged home on Lasix 20 mg Discharge in the morning.  Paroxysmal atrial fibrillation: She remains in normal sinus rhythm continue current medication. Continue anticoagulation.  Essential hypertension: Remains controlled continue current regimen.  Hyperthyroidism: Continue Synthroid.  Acute kidney injury: With a baseline creatinine of less than 0.8 likely due to cardiorenal syndrome.  Normocytic anemia At baseline with a hemoglobin around 10 in the last several months back in 2018 it was 12. Will need further workup as an outpatient. Check an anemia panel.   DVT prophylaxis: apixiban Family Communication:None Disposition Plan/Barrier to D/C: home in am Code Status:     Code Status Orders  (From admission, onward)        Start     Ordered   11/12/17 2323  Full code  Continuous     11/12/17 2322    Code Status History    Date Active Date Inactive Code Status Order ID Comments User Context   08/25/2017 16:09 09/01/2017 19:04 Full Code [id number]  Ivor Costa, MD ED        IV Access:    Peripheral IV   Procedures and diagnostic studies:   No results  found.   Medical Consultants:    None.  Anti-Infectives:   None  Subjective:    Rebecca Norman relates that her breathing is improved  Objective:    Vitals:   11/14/17 1509 11/14/17 2228 11/15/17 0547 11/15/17 0636  BP: (!) 112/48 (!) 115/57  109/78  Pulse: (!) 57 67  62  Resp:  18  18  Temp: (!) 97.5 F (36.4 C) (!) 97.5 F (36.4 C)  97.8 F (36.6 C)  TempSrc: Oral Oral  Oral  SpO2: 95% 100%  94%  Weight:   74.3 kg (163 lb 14.4 oz)   Height:        Intake/Output Summary (Last 24 hours) at 11/15/2017 0842 Last data filed at 11/15/2017 0600 Gross per 24 hour  Intake 540 ml  Output -  Net 540 ml   Filed Weights   11/13/17 0500 11/14/17 0543 11/15/17 0547  Weight: 75.1 kg (165 lb 9.1 oz) 75.8 kg (167 lb 1.7 oz) 74.3 kg (163 lb 14.4 oz)    Exam: General exam: In no acute distress. Respiratory system: Good air movement and clear to auscultation. Cardiovascular system: S1 & S2 heard, RRR. No JVD,  Gastrointestinal system: Abdomen is nondistended, soft and nontender.  Central nervous system: He is awake alert and oriented x3 nonfocal. Extremities: N trace lower extremity edema Skin: No rashes, lesions or ulcers Psychiatry: Judgment and insight appear appropriate   Data Reviewed:    Labs: Basic Metabolic Panel: Recent Labs  Lab 11/12/17 1843 11/13/17 0528 11/14/17 0507 11/15/17 9562  NA 139 140 139 138  K 4.0 3.8 3.8 4.0  CL 103 104 101 102  CO2  --  29 29 27   GLUCOSE 123* 86 91 87  BUN 22* 22* 25* 29*  CREATININE 1.20* 0.95 0.91 0.82  CALCIUM  --  8.6* 8.6* 8.7*  MG  --   --  2.0  --    GFR Estimated Creatinine Clearance: 45 mL/min (by C-G formula based on SCr of 0.82 mg/dL). Liver Function Tests: Recent Labs  Lab 11/12/17 1747 11/13/17 0528  AST 16 15  ALT 21 12*  ALKPHOS 76 72  BILITOT 0.7 0.7  PROT 6.2* 6.1*  ALBUMIN 3.2* 3.3*   No results for input(s): LIPASE, AMYLASE in the last 168 hours. No results for input(s): AMMONIA in  the last 168 hours. Coagulation profile No results for input(s): INR, PROTIME in the last 168 hours.  CBC: Recent Labs  Lab 11/12/17 1843 11/13/17 0528 11/15/17 0519  WBC  --  6.9 6.6  NEUTROABS  --   --  4.7  HGB 10.5* 10.9* 10.8*  HCT 31.0* 34.0* 33.6*  MCV  --  94.7 94.4  PLT  --  216 234   Cardiac Enzymes: Recent Labs  Lab 11/13/17 0018 11/13/17 0528  TROPONINI <0.03 <0.03   BNP (last 3 results) No results for input(s): PROBNP in the last 8760 hours. CBG: No results for input(s): GLUCAP in the last 168 hours. D-Dimer: No results for input(s): DDIMER in the last 72 hours. Hgb A1c: No results for input(s): HGBA1C in the last 72 hours. Lipid Profile: No results for input(s): CHOL, HDL, LDLCALC, TRIG, CHOLHDL, LDLDIRECT in the last 72 hours. Thyroid function studies: No results for input(s): TSH, T4TOTAL, T3FREE, THYROIDAB in the last 72 hours.  Invalid input(s): FREET3 Anemia work up: No results for input(s): VITAMINB12, FOLATE, FERRITIN, TIBC, IRON, RETICCTPCT in the last 72 hours. Sepsis Labs: Recent Labs  Lab 11/13/17 0528 11/15/17 0519  WBC 6.9 6.6   Microbiology Recent Results (from the past 240 hour(s))  MRSA PCR Screening     Status: None   Collection Time: 11/12/17 10:58 PM  Result Value Ref Range Status   MRSA by PCR NEGATIVE NEGATIVE Final    Comment:        The GeneXpert MRSA Assay (FDA approved for NASAL specimens only), is one component of a comprehensive MRSA colonization surveillance program. It is not intended to diagnose MRSA infection nor to guide or monitor treatment for MRSA infections. Performed at [facility], [place] [address], [place], [place] [address]      Medications:   . amiodarone  200 mg Oral Daily  . amLODipine  2.5 mg Oral Daily  . apixaban  5 mg Oral BID  . furosemide  40 mg Oral Daily  . levothyroxine  50 mcg Oral QAC breakfast  . sodium chloride flush  3 mL Intravenous Q12H   Continuous  Infusions: . sodium chloride       LOS: 3 days   Charlynne Cousins  [facility] Pager [phone]  *Please refer to [email or web address], password [email or web address] to get updated schedule on who will round on this patient, as hospitalists switch teams weekly. If 7PM-7AM, please contact night-coverage at [email or web address], password [email or web address] for any overnight needs.  11/15/2017, 8:42 AM

## 2017-11-16 ENCOUNTER — Ambulatory Visit: Payer: Medicare Other | Admitting: Physician Assistant

## 2017-11-16 DIAGNOSIS — M6281 Muscle weakness (generalized): Secondary | ICD-10-CM | POA: Diagnosis not present

## 2017-11-16 DIAGNOSIS — M5031 Other cervical disc degeneration,  high cervical region: Secondary | ICD-10-CM | POA: Diagnosis not present

## 2017-11-16 DIAGNOSIS — E039 Hypothyroidism, unspecified: Secondary | ICD-10-CM | POA: Diagnosis not present

## 2017-11-16 DIAGNOSIS — E034 Atrophy of thyroid (acquired): Secondary | ICD-10-CM | POA: Diagnosis not present

## 2017-11-16 DIAGNOSIS — I5031 Acute diastolic (congestive) heart failure: Secondary | ICD-10-CM | POA: Diagnosis not present

## 2017-11-16 DIAGNOSIS — M8580 Other specified disorders of bone density and structure, unspecified site: Secondary | ICD-10-CM | POA: Diagnosis not present

## 2017-11-16 DIAGNOSIS — I5040 Unspecified combined systolic (congestive) and diastolic (congestive) heart failure: Secondary | ICD-10-CM | POA: Diagnosis not present

## 2017-11-16 DIAGNOSIS — R488 Other symbolic dysfunctions: Secondary | ICD-10-CM | POA: Diagnosis not present

## 2017-11-16 DIAGNOSIS — N19 Unspecified kidney failure: Secondary | ICD-10-CM | POA: Diagnosis not present

## 2017-11-16 DIAGNOSIS — I10 Essential (primary) hypertension: Secondary | ICD-10-CM | POA: Diagnosis not present

## 2017-11-16 DIAGNOSIS — I1310 Hypertensive heart and chronic kidney disease without heart failure, with stage 1 through stage 4 chronic kidney disease, or unspecified chronic kidney disease: Secondary | ICD-10-CM | POA: Diagnosis not present

## 2017-11-16 DIAGNOSIS — R262 Difficulty in walking, not elsewhere classified: Secondary | ICD-10-CM | POA: Diagnosis not present

## 2017-11-16 DIAGNOSIS — N189 Chronic kidney disease, unspecified: Secondary | ICD-10-CM | POA: Diagnosis not present

## 2017-11-16 DIAGNOSIS — D649 Anemia, unspecified: Secondary | ICD-10-CM | POA: Diagnosis not present

## 2017-11-16 DIAGNOSIS — J80 Acute respiratory distress syndrome: Secondary | ICD-10-CM | POA: Diagnosis not present

## 2017-11-16 DIAGNOSIS — N179 Acute kidney failure, unspecified: Secondary | ICD-10-CM | POA: Diagnosis not present

## 2017-11-16 DIAGNOSIS — I5033 Acute on chronic diastolic (congestive) heart failure: Secondary | ICD-10-CM | POA: Diagnosis not present

## 2017-11-16 DIAGNOSIS — I482 Chronic atrial fibrillation: Secondary | ICD-10-CM | POA: Diagnosis not present

## 2017-11-16 DIAGNOSIS — I129 Hypertensive chronic kidney disease with stage 1 through stage 4 chronic kidney disease, or unspecified chronic kidney disease: Secondary | ICD-10-CM | POA: Diagnosis not present

## 2017-11-16 DIAGNOSIS — I130 Hypertensive heart and chronic kidney disease with heart failure and stage 1 through stage 4 chronic kidney disease, or unspecified chronic kidney disease: Secondary | ICD-10-CM | POA: Diagnosis not present

## 2017-11-16 DIAGNOSIS — I5032 Chronic diastolic (congestive) heart failure: Secondary | ICD-10-CM | POA: Diagnosis not present

## 2017-11-16 DIAGNOSIS — Z79899 Other long term (current) drug therapy: Secondary | ICD-10-CM | POA: Diagnosis not present

## 2017-11-16 DIAGNOSIS — I480 Paroxysmal atrial fibrillation: Secondary | ICD-10-CM | POA: Diagnosis not present

## 2017-11-16 MED ORDER — FUROSEMIDE 20 MG PO TABS
20.0000 mg | ORAL_TABLET | Freq: Every day | ORAL | Status: DC
  Administered 2017-11-16: 20 mg via ORAL
  Filled 2017-11-16: qty 1

## 2017-11-16 NOTE — Clinical Social Work Placement (Addendum)
D/C Summary sent Admission paperwork complete by patient and sent.  Nurse given number to call report.  [facility] called to transport ETA : 5:30pm  CLINICAL SOCIAL WORK PLACEMENT  NOTE  Date:  11/16/2017  Patient Details  Name: Rebecca Norman MRN: [id number] Date of Birth: 1924-11-29  Clinical Social Work is seeking post-discharge placement for this patient at the [place] level of care (*CSW will initial, date and re-position this form in  chart as items are completed):  Yes   Patient/family provided with [facility] Work Department's list of facilities offering this level of care within the geographic area requested by the patient (or if unable, by the patient's family).  Yes   Patient/family informed of their freedom to choose among providers that offer the needed level of care, that participate in Medicare, Medicaid or managed care program needed by the patient, have an available bed and are willing to accept the patient.  Yes   Patient/family informed of [facility]'s ownership interest in [facility] and [facility], as well as of the fact that they are under no obligation to receive care at these facilities.  PASRR submitted to EDS on       PASRR number received on       Existing PASRR number confirmed on 11/16/17     FL2 transmitted to all facilities in geographic area requested by pt/family on       FL2 transmitted to all facilities within larger geographic area on 11/16/17     Patient informed that his/her managed care company has contracts with or will negotiate with certain facilities, including the following:  [facility]         Patient/family informed of bed offers received.  Patient chooses bed at [facility]     Physician recommends and patient chooses bed at      Patient to be transferred to [facility] on 11/16/17.  Patient to be transferred to facility by [facility]      Patient family notified on   of transfer.  Name of family member notified:      Patient requested to notify her own family.   PHYSICIAN Please sign FL2     Additional Comment:    _______________________________________________ Lia Hopping, LCSW 11/16/2017, 2:11 PM

## 2017-11-16 NOTE — NC FL2 (Addendum)
[place] LEVEL OF CARE SCREENING TOOL     IDENTIFICATION  Patient Name: Rebecca Norman Birthdate: November 15, 1924 Sex: female Admission Date (Current Location): 11/12/2017  [facility] and [place] Number:  Herbalist and Address:  [facility],  [place], [place]      Provider Number: [id number]  Attending Physician Name and Address:  Charlynne Cousins, MD  Relative Name and Phone Number:       Current Level of Care: Hospital Recommended Level of Care: [place] Prior Approval Number:    Date Approved/Denied:   PASRR Number:   [id number]  Discharge Plan:      Current Diagnoses: Patient Active Problem List   Diagnosis Date Noted  . AKI (acute kidney injury) ([place]) 11/15/2017  . Cardiorenal syndrome with renal failure 11/15/2017  . Acute diastolic CHF (congestive heart failure) ([place]) 11/12/2017  . Anemia 11/12/2017  . CHF (congestive heart failure) ([place]) 11/12/2017  . Fall 08/25/2017  . Closed left ankle fracture 08/25/2017  . Chronic diastolic CHF (congestive heart failure) ([place]) 08/25/2017  . Closed avulsion fracture of medial malleolus of left tibia   . Fall at home   . Current use of long term anticoagulation 08/11/2017  . Atrial fibrillation, chronic ([place]) 08/06/2017  . Hypothyroid 06/13/2017  . Palpitations 06/13/2017  . Abnormal EKG 06/13/2017  . Heart murmur 06/13/2017  . S/P laminectomy 07/10/2013  . HTN (hypertension) 07/10/2013  . BLADDER CANCER 12/17/2008  . DIVERTICULOSIS OF COLON 12/12/2008  . COLONIC POLYPS, HYPERPLASTIC, HX OF 12/12/2008    Orientation RESPIRATION BLADDER Height & Weight     Self, Time, Situation, Place  2L Continent Weight: 163 lb 1.6 oz (74 kg) Height:  5\' 4"  (162.6 cm)  BEHAVIORAL SYMPTOMS/MOOD NEUROLOGICAL BOWEL NUTRITION STATUS      Continent Diet-Low Sodium Heart Healthy   AMBULATORY STATUS COMMUNICATION OF NEEDS Skin   Extensive Assist Verbally Normal                       Personal Care Assistance Level of Assistance  Bathing, Feeding, Dressing Bathing Assistance: Limited assistance Feeding assistance: Independent Dressing Assistance: Limited assistance     Functional Limitations Info  Sight, Hearing, Speech Sight Info: Impaired(wears glasses) Hearing Info: Adequate Speech Info: Adequate    SPECIAL CARE FACTORS FREQUENCY  PT (By licensed PT), OT (By licensed OT)     PT Frequency: 5x/week OT Frequency: 5x/week            Contractures Contractures Info: Not present    Additional Factors Info  Code Status, Allergies Code Status Info: fullcode Allergies Info: Allergies: Flagyl Metronidazole, Lisinopril           Current Medications (11/16/2017):  This is the current hospital active medication list Current Facility-Administered Medications  Medication Dose Route Frequency Provider Last Rate Last Dose  . 0.9 %  sodium chloride infusion  250 mL Intravenous PRN Jani Gravel, MD      . acetaminophen (TYLENOL) tablet 650 mg  650 mg Oral Q6H PRN Jani Gravel, MD       Or  . acetaminophen (TYLENOL) suppository 650 mg  650 mg Rectal Q6H PRN Jani Gravel, MD      . amiodarone (PACERONE) tablet 200 mg  200 mg Oral Daily Jani Gravel, MD   200 mg at 11/16/17 1031  . amLODipine (NORVASC) tablet 2.5 mg  2.5 mg Oral Daily Jani Gravel, MD   2.5 mg at 11/16/17  1030  . apixaban (ELIQUIS) tablet 5 mg  5 mg Oral BID Lynelle Doctor, RPH   5 mg at 11/16/17 1031  . furosemide (LASIX) tablet 20 mg  20 mg Oral Daily Charlynne Cousins, MD   20 mg at 11/16/17 1031  . levothyroxine (SYNTHROID, LEVOTHROID) tablet 50 mcg  50 mcg Oral QAC breakfast Jani Gravel, MD   50 mcg at 11/16/17 0919  . oxyCODONE-acetaminophen (PERCOCET/ROXICET) 5-325 MG per tablet 1 tablet  1 tablet Oral Q6H PRN Jani Gravel, MD      . polyethylene glycol (MIRALAX / GLYCOLAX) packet 17 g  17 g Oral Daily PRN Jani Gravel, MD      . sodium chloride flush (NS) 0.9 % injection 3 mL  3 mL  Intravenous Q12H Jani Gravel, MD   3 mL at 11/16/17 1031  . sodium chloride flush (NS) 0.9 % injection 3 mL  3 mL Intravenous PRN Jani Gravel, MD         Discharge Medications: Please see discharge summary for a list of discharge medications.  Relevant Imaging Results:  Relevant Lab Results:   Additional Information ssn: [id number]  Lia Hopping, LCSW

## 2017-11-16 NOTE — Care Management Note (Signed)
Case Management Note  Patient Details  Name: Rebecca Norman MRN: [id number] Date of Birth: 01-Nov-1924  Subjective/Objective:  Noted d/c plan SNF-CSW following.                  Action/Plan:d/c SNF.   Expected Discharge Date:  11/16/17               Expected Discharge Plan:  [place]  In-House Referral:  Clinical Social Work  Discharge planning Services  CM Consult  Post Acute Care Choice:  Home Health(Active w/[facility]-HHPT/OT/aide;has rw) Choice offered to:  Patient  DME Arranged:    DME Agency:     HH Arranged:    [place] Agency:  [facility] [employer]  Status of Service:  Completed, signed off  If discussed at [employer], dates discussed:    Additional Comments:  [name], RN 11/16/2017, 11:48 AM

## 2017-11-16 NOTE — Clinical Social Work Note (Signed)
Clinical Social Work Assessment  Patient Details  Name: Rebecca Norman MRN: [id number] Date of Birth: 03-Oct-1924  Date of referral:  11/16/17               Reason for consult:  Facility Placement                Permission sought to share information with:    Permission granted to share information::     Name::        Agency::  SNF  Relationship::     Contact Information:     Housing/Transportation Living arrangements for the past 2 months:  Single Family Home Source of Information:  Patient Patient Interpreter Needed:  None Criminal Activity/Legal Involvement Pertinent to Current Situation/Hospitalization:  No - Comment as needed Significant Relationships:  Adult Children Lives with:  Self Do you feel safe going back to the place where you live?  Yes Need for family participation in patient care:  No (Coment)  Care giving concerns:   SNF for rehab.   Social Worker assessment / plan:  CSW discussed discharge to SNF with patient. Patient reports feeling "bothered" about having to go back to a SNF for rehab. Patient reports she lives at home alone and is usually independent. Patient able to complete her own activities of daily living. Patient agreeable to short rehab stay before returning home.  Patient has new oxygen and will receive at facility.  CSW discussed SNF options with patient. She is agreeable to [facility] w/ private room only. Patient informed CSW she has secondary insurance-[employer] to cover her SNF copay.  CSW located [employer] information under media and sent copies over to facility for their records.  CSW sent admission paperwork to facility once completed by patient/ originals placed in patient transport packet.   Plan: SNF  Employment status:  Retired Forensic scientist:  Medicare PT Recommendations:  [place] / Referral to community resources:  [place]  Patient/Family's Response to care:  Patient appreciative  of family services. No family at bedside to assess. But patient son informed of d/c to SNF and he is agreeable with the plan.   Patient/Family's Understanding of and Emotional Response to Diagnosis, Current Treatment, and Prognosis:  Patient alert and oriented and knowledgeable about her care. Patient reports feeling bothered about going to SNF again.   Emotional Assessment Appearance:  Appears stated age Attitude/Demeanor/Rapport:    Affect (typically observed):  Accepting, Calm, Pleasant Orientation:  Oriented to Self, Oriented to Place, Oriented to  Time, Oriented to Situation Alcohol / Substance use:  Not Applicable Psych involvement (Current and /or in the community):  No (Comment)  Discharge Needs  Concerns to be addressed:  Discharge Planning Concerns Readmission within the last 30 days:  Yes Current discharge risk:  Dependent with Mobility Barriers to Discharge:  No Barriers Identified   Lia Hopping, LCSW 11/16/2017, 4:28 PM

## 2017-11-16 NOTE — Discharge Summary (Signed)
Physician Discharge Summary  Rebecca Norman STM:[id number] DOB: 10-Jul-1925 DOA: 11/12/2017  PCP: [name], MD  Admit date: 11/12/2017 Discharge date: 11/16/2017  Admitted From: home Disposition:  Home  Recommendations for Outpatient Follow-up:  1. Follow up with PCP in 1-2 weeks 2. Please obtain BMP/CBC in one week. 3. Will be discharged to skilled nursing facility.   Home Health:No Equipment/Devices:none  Discharge Condition: Stable CODE STATUS:full Diet recommendation: Heart Healthy   Brief/Interim Summary: 84 y.o. female past medical history of essential hypertension, essential hypertension with an EF of 65%, and blood parameters consistent with diastolic heart failure, aortic valve show mild stenosis with a gradient of 11 presents to the ED with dyspnea  Discharge Diagnoses:  Principal Problem:   Acute diastolic CHF (congestive heart failure) ([place]) Active Problems:   HTN (hypertension)   Hypothyroid   Atrial fibrillation, chronic (HCC)   Anemia   CHF (congestive heart failure) (HCC)   AKI (acute kidney injury) ([place])   Cardiorenal syndrome with renal failure  Acute diastolic heart failure: Estimated dry weight is 74-75 kg on admission 76. Cardiology was consulted she started an IV Lasix and she diuresed well. At the time of discharge her estimated dry weight is 74 kg. Physical therapy evaluated the patient and the recommended skilled nursing facility. Patient will go home on Lasix 20 mill grams daily.  Paroxysmal atrial fibrillation: Currently sinus rhythm rate controlled CHADs VASC=4, 2 continue Eliquis.  Essential hypertension: No changes were made.  Acute kidney injury: With a baseline creatinine of less than 1 it improved with diuresis. Likely due to cardiorenal syndrome.  Normocytic anemia with a baseline hemoglobin of 10, will need further follow-up as an outpatient.   Discharge Instructions  Discharge Instructions    Diet - low sodium heart  healthy   Complete by:  As directed    Increase activity slowly   Complete by:  As directed      Allergies as of 11/16/2017      Reactions   Flagyl [metronidazole] Hives   Lisinopril Cough      Medication List    TAKE these medications   acetaminophen 500 MG tablet Commonly known as:  TYLENOL Take 500 mg by mouth every 6 (six) hours as needed for headache (pain).   amiodarone 200 MG tablet Commonly known as:  PACERONE Take 1 tablet (200 mg total) by mouth daily.   amLODipine 2.5 MG tablet Commonly known as:  NORVASC Take 1 tablet (2.5 mg total) by mouth daily.   apixaban 5 MG Tabs tablet Commonly known as:  ELIQUIS Take 1 tablet (5 mg total) by mouth 2 (two) times daily.   furosemide 20 MG tablet Commonly known as:  LASIX Take 1 tablet (20 mg total) by mouth daily.   levothyroxine 50 MCG tablet Commonly known as:  SYNTHROID, LEVOTHROID Take 50 mcg by mouth daily before breakfast.   metoprolol tartrate 25 MG tablet Commonly known as:  LOPRESSOR Take 1 tablet (25 mg total) by mouth 2 (two) times daily.   oxyCODONE-acetaminophen 5-325 MG tablet Commonly known as:  PERCOCET/ROXICET Take 1 tablet by mouth every 6 (six) hours as needed for moderate pain or severe pain.   polyethylene glycol packet Commonly known as:  MIRALAX / GLYCOLAX Take 17 g by mouth daily as needed for mild constipation.      Follow-up Information    Lelon Perla, MD Follow up.   Specialty:  Cardiology Why:  office will conatct you Contact information: [place]  [place] [address] [phone]        [facility] Follow up.   Specialty:  Home Health Services Why:  [facility] physical/occupational therapy/aide Contact information: [address] [place] [address] [phone]          Allergies  Allergen Reactions  . Flagyl [Metronidazole] Hives  . Lisinopril Cough    Consultations:  Cardiology   Procedures/Studies: Dg Chest Port 1  View  Result Date: 11/12/2017 CLINICAL DATA:  She was seen at [facility] walk-in clinic today and was found to have low SPO2. Her family are with her and tell me that pt. Has had hx of these symptoms "but it was worse and she had to be admitted to the hospital--the doctors said it is a fluid build-up".; hx ureter CA; ex smoker EXAM: PORTABLE CHEST 1 VIEW COMPARISON:  Exam performed earlier today. FINDINGS: Cardiac silhouette is mildly enlarged. No mediastinal or hilar masses. There prominent bronchovascular markings. Hazy opacities noted at both lung bases consistent with pleural effusions with associated atelectasis. No pneumothorax. Skeletal structures are demineralized but grossly intact. IMPRESSION: Findings consistent with mild congestive heart failure without significant change from the earlier exam. Electronically Signed   By: Lajean Manes M.D.   On: 11/12/2017 18:18     Subjective: Feels better no complaints.  Discharge Exam: Vitals:   11/15/17 2019 11/16/17 0449  BP: (!) 128/48 (!) 100/44  Pulse: 70 64  Resp: 20 20  Temp: 98.2 F (36.8 C) 98.9 F (37.2 C)  SpO2: 94%    Vitals:   11/15/17 1354 11/15/17 2019 11/16/17 0449 11/16/17 0529  BP: (!) 135/50 (!) 128/48 (!) 100/44   Pulse: 72 70 64   Resp: 20 20 20    Temp: (!) 97.5 F (36.4 C) 98.2 F (36.8 C) 98.9 F (37.2 C)   TempSrc: Oral Oral Oral   SpO2: 95% 94%    Weight:   75.4 kg (166 lb 3.6 oz) 74 kg (163 lb 1.6 oz)  Height:        General: Pt is alert, awake, not in acute distress Cardiovascular: RRR, S1/S2 +, no rubs, no gallops Respiratory: CTA bilaterally, no wheezing, no rhonchi Abdominal: Soft, NT, ND, bowel sounds + Extremities: no edema, no cyanosis    The results of significant diagnostics from this hospitalization (including imaging, microbiology, ancillary and laboratory) are listed below for reference.     Microbiology: Recent Results (from the past 240 hour(s))  MRSA PCR Screening     Status: None    Collection Time: 11/12/17 10:58 PM  Result Value Ref Range Status   MRSA by PCR NEGATIVE NEGATIVE Final    Comment:        The GeneXpert MRSA Assay (FDA approved for NASAL specimens only), is one component of a comprehensive MRSA colonization surveillance program. It is not intended to diagnose MRSA infection nor to guide or monitor treatment for MRSA infections. Performed at [facility], [place] [address] [place], [place] [address]      Labs: BNP (last 3 results) Recent Labs    08/06/17 1500 08/25/17 1912 11/12/17 1748  BNP 1,080.8* 1,571.7* 630.1*   Basic Metabolic Panel: Recent Labs  Lab 11/12/17 1843 11/13/17 0528 11/14/17 0507 11/15/17 0519  NA 139 140 139 138  K 4.0 3.8 3.8 4.0  CL 103 104 101 102  CO2  --  29 29 27   GLUCOSE 123* 86 91 87  BUN 22* 22* 25* 29*  CREATININE  1.20* 0.95 0.91 0.82  CALCIUM  --  8.6* 8.6* 8.7*  MG  --   --  2.0  --    Liver Function Tests: Recent Labs  Lab 11/12/17 1747 11/13/17 0528  AST 16 15  ALT 21 12*  ALKPHOS 76 72  BILITOT 0.7 0.7  PROT 6.2* 6.1*  ALBUMIN 3.2* 3.3*   No results for input(s): LIPASE, AMYLASE in the last 168 hours. No results for input(s): AMMONIA in the last 168 hours. CBC: Recent Labs  Lab 11/12/17 1843 11/13/17 0528 11/15/17 0519  WBC  --  6.9 6.6  NEUTROABS  --   --  4.7  HGB 10.5* 10.9* 10.8*  HCT 31.0* 34.0* 33.6*  MCV  --  94.7 94.4  PLT  --  216 234   Cardiac Enzymes: Recent Labs  Lab 11/13/17 0018 11/13/17 0528  TROPONINI <0.03 <0.03   BNP: Invalid input(s): POCBNP CBG: No results for input(s): GLUCAP in the last 168 hours. D-Dimer No results for input(s): DDIMER in the last 72 hours. Hgb A1c No results for input(s): HGBA1C in the last 72 hours. Lipid Profile No results for input(s): CHOL, HDL, LDLCALC, TRIG, CHOLHDL, LDLDIRECT in the last 72 hours. Thyroid function studies No results for input(s): TSH, T4TOTAL, T3FREE, THYROIDAB in the last 72  hours.  Invalid input(s): FREET3 Anemia work up Recent Labs    11/15/17 0519 11/15/17 1011  VITAMINB12  --  184  FOLATE  --  9.9  FERRITIN  --  128  TIBC  --  315  IRON  --  43  RETICCTPCT 4.1*  --    Urinalysis    Component Value Date/Time   COLORURINE YELLOW 12/30/2008 1535   APPEARANCEUR CLEAR 12/30/2008 1535   LABSPEC 1.019 12/30/2008 1535   PHURINE 5.5 12/30/2008 1535   GLUCOSEU NEGATIVE 12/30/2008 1535   HGBUR SMALL (A) 12/30/2008 1535   BILIRUBINUR NEGATIVE 12/30/2008 1535   KETONESUR NEGATIVE 12/30/2008 1535   PROTEINUR NEGATIVE 12/30/2008 1535   UROBILINOGEN 0.2 12/30/2008 1535   NITRITE NEGATIVE 12/30/2008 1535   LEUKOCYTESUR NEGATIVE 12/30/2008 1535   Sepsis Labs Invalid input(s): PROCALCITONIN,  WBC,  LACTICIDVEN Microbiology Recent Results (from the past 240 hour(s))  MRSA PCR Screening     Status: None   Collection Time: 11/12/17 10:58 PM  Result Value Ref Range Status   MRSA by PCR NEGATIVE NEGATIVE Final    Comment:        The GeneXpert MRSA Assay (FDA approved for NASAL specimens only), is one component of a comprehensive MRSA colonization surveillance program. It is not intended to diagnose MRSA infection nor to guide or monitor treatment for MRSA infections. Performed at [facility], [place] [address] [place], [place] [address]      Time coordinating discharge: Over 30 minutes  SIGNED:   Charlynne Cousins, MD  [facility] 11/16/2017, 10:05 AM Pager   If 7PM-7AM, please contact night-coverage [email or web address] Password [email or web address]

## 2017-11-16 NOTE — Progress Notes (Signed)
Physical Therapy Treatment Patient Details Name: Rebecca Norman MRN: [id number] DOB: Nov 18, 1924 Today's Date: 11/16/2017    SATURATION QUALIFICATIONS: (This note is used to comply with regulatory documentation for home oxygen)  Patient Saturations on Room Air at Rest = 91%  Patient Saturations on Room Air while Ambulating = 86%  Patient Saturations on 2 Liters of oxygen while Ambulating = 90%  Please briefly explain why patient needs home oxygen: to maintain appropriate/safe O2 saturation level with activity    History of Present Illness 84 yo female admitted with CHF, weaknesss. Hx of DDD, HTn, hypothyroidism, ORIF L ankle 08/2017, osteopenia, A fib    PT Comments    Pt continues to participate well. Unfortunately, she is still requiring O2 for activity. Pt lives alone. She would not be able to manage a RW and an O2 tank safely. She is not happy about having to return to rehab although she understands that it is likely safest option at this time.     Follow Up Recommendations  SNF     Equipment Recommendations  None recommended by PT    Recommendations for Other Services       Precautions / Restrictions Precautions Precautions: Fall Precaution Comments: monitor O2 sats Restrictions Weight Bearing Restrictions: No    Mobility  Bed Mobility               General bed mobility comments: oob in recliner  Transfers Overall transfer level: Needs assistance Equipment used: Rolling walker (2 wheeled) Transfers: Sit to/from Stand Sit to Stand: Min guard            Ambulation/Gait Ambulation/Gait assistance: Min guard Ambulation Distance (Feet): 120 Feet Assistive device: Rolling walker (2 wheeled) Gait Pattern/deviations: Decreased stride length;Step-through pattern     General Gait Details: close guard for safety. Slow gait speed. 2 brief standing rest breaks taken   Stairs            Wheelchair Mobility    Modified Rankin (Stroke Patients  Only)       Balance                                            Cognition Arousal/Alertness: Awake/alert Behavior During Therapy: WFL for tasks assessed/performed Overall Cognitive Status: Within Functional Limits for tasks assessed                                        Exercises      General Comments        Pertinent Vitals/Pain Pain Assessment: No/denies pain    Home Living                      Prior Function            PT Goals (current goals can now be found in the care plan section) Progress towards PT goals: Progressing toward goals    Frequency    Min 3X/week      PT Plan Discharge plan needs to be updated    Co-evaluation              AM-PAC PT "6 Clicks" Daily Activity  Outcome Measure  Difficulty turning over in bed (including adjusting bedclothes, sheets and blankets)?: A Little Difficulty moving from lying on  back to sitting on the side of the bed? : A Little Difficulty sitting down on and standing up from a chair with arms (e.g., wheelchair, bedside commode, etc,.)?: A Little Help needed moving to and from a bed to chair (including a wheelchair)?: A Little Help needed walking in hospital room?: A Little Help needed climbing 3-5 steps with a railing? : A Little 6 Click Score: 18    End of Session Equipment Utilized During Treatment: Oxygen Activity Tolerance: Patient tolerated treatment well Patient left: in chair;with call bell/phone within reach   PT Visit Diagnosis: Muscle weakness (generalized) (M62.81);Difficulty in walking, not elsewhere classified (R26.2)     Time: 6067-7034 PT Time Calculation (min) (ACUTE ONLY): 25 min  Charges:  $Gait Training: 8-22 mins $Therapeutic Activity: 8-22 mins                    G Codes:          Weston Anna, MPT Pager: [phone]

## 2017-11-17 ENCOUNTER — Non-Acute Institutional Stay (SKILLED_NURSING_FACILITY): Payer: Medicare Other | Admitting: Internal Medicine

## 2017-11-17 DIAGNOSIS — I10 Essential (primary) hypertension: Secondary | ICD-10-CM

## 2017-11-17 DIAGNOSIS — I480 Paroxysmal atrial fibrillation: Secondary | ICD-10-CM

## 2017-11-17 DIAGNOSIS — E039 Hypothyroidism, unspecified: Secondary | ICD-10-CM | POA: Diagnosis not present

## 2017-11-17 DIAGNOSIS — I5033 Acute on chronic diastolic (congestive) heart failure: Secondary | ICD-10-CM

## 2017-11-17 DIAGNOSIS — D649 Anemia, unspecified: Secondary | ICD-10-CM | POA: Diagnosis not present

## 2017-11-21 ENCOUNTER — Encounter: Payer: Self-pay | Admitting: Internal Medicine

## 2017-11-21 DIAGNOSIS — I480 Paroxysmal atrial fibrillation: Secondary | ICD-10-CM | POA: Insufficient documentation

## 2017-11-21 DIAGNOSIS — I5033 Acute on chronic diastolic (congestive) heart failure: Secondary | ICD-10-CM

## 2017-11-21 LAB — CBC AND DIFFERENTIAL
HCT: 36 (ref 36–46)
Hemoglobin: 11.7 — AB (ref 12.0–16.0)
PLATELETS: 224 (ref 150–399)
WBC: 7.5

## 2017-11-21 LAB — BASIC METABOLIC PANEL
BUN: 25 — AB (ref 4–21)
CREATININE: 0.7 (ref 0.5–1.1)
Glucose: 82
Potassium: 4.9 (ref 3.4–5.3)
Sodium: 140 (ref 137–147)

## 2017-11-21 NOTE — Progress Notes (Signed)
: Provider:  Noah Delaine alexanderMD Location:  [facility]   Place of Service:   skilled nursing facility  PCP: [name], MD Patient Care Team: [name], MD as PCP - General (Family Medicine) Lelon Perla, MD as PCP - Cardiology (Cardiology)  Extended Emergency Contact Information Primary Emergency Contact: Sulema, Braid Mobile Phone: [phone] Relation: Son Secondary Emergency Contact: [name] [place] Phone: [phone] Mobile Phone: [phone] Relation: Daughter     Allergies: Flagyl [metronidazole] and Lisinopril  Chief Complaint  Patient presents with  . New Admit To SNF    HPI: Patient is 84 y.o. female with hypertension, hypothyroidism who presented to the emergency department with shortness of breath for the past 3 days. She was apparently seen at PCP office and found to be hypoxic and transferred to ER by private vehicle. Patient notes slightl lower extremity edema. She d fever or chcough chest paiitations nausea vomiting diarrhea black stool. Dyspnea on exertion. In ED patient was noted to be hypoxic with O2 sat of 79% and a chest x-ray consistent with mild congestive heart failure.atient was admitted to [facility] from 3/10-14 for acute on chronic congestive heart failure.patient was diuresed with IV Lasix down to a dry weight of 74 kg All the medical problems remain stable. Patient is admitted to skilled nursing facility for OT/PT. While at skilled nursing facility patient will be followed for paroxysmal atrial fibrillation treated with amiodarone,metoprolol and Eliquis, hypertension treated with Norvasc and Lasix and metoprolol nd hypothyroidism treated with replacement.  Past Medical History:  Diagnosis Date  . DDD (degenerative disc disease), cervical    severe/  auto fusion c2-c5  . Diverticulosis   . Hemorrhoids   . History of adenomatous polyp of colon    1995  adenomatous polyp/   1997 & 2003  hyperplastic polyp's  . History of bladder cancer urologist-  dr [name]   s/p  TURBT's  . History of cancer of ureter   . Hypertension   . Hypothyroid   . Nocturia   . Osteoarthritis   . Osteopenia     Past Surgical History:  Procedure Laterality Date  . BACK SURGERY    . CARDIOVERSION N/A 08/09/2017   Procedure: CARDIOVERSION;  Surgeon: Larey Dresser, MD;  Location: [facility] ENDOSCOPY;  Service: Cardiovascular;  Laterality: N/A;  . CATARACT EXTRACTION W/ INTRAOCULAR LENS  IMPLANT, BILATERAL Bilateral   . CYSTOSCOPY W/ RETROGRADES  10/03/2011   Procedure: CYSTOSCOPY WITH RETROGRADE PYELOGRAM;  Surgeon: Molli Hazard, MD;  Location[facility];  Service: Urology;;  . Consuela Mimes W/ RETROGRADES Bilateral 06/17/2016   Procedure: CYSTOSCOPY WITH RETROGRADE PYELOGRAM;  Surgeon: Alexis Frock, MD;  Location: [facility];  Service: Urology;  Laterality: Bilateral;  . CYSTOSCOPY WITH BIOPSY N/A 06/17/2016   Procedure: CYSTOSCOPY WITH BIOPSY AND FULGERATION;  Surgeon: Alexis Frock, MD;  Location: [facility];  Service: Urology;  Laterality: N/A;  . LAMINECTOMY  2009   L3 - 4  . ORIF ANKLE FRACTURE Left 08/28/2017   Procedure: OPEN REDUCTION INTERNAL FIXATION (ORIF) ANKLE FRACTURE;  Surgeon: Shona Needles, MD;  Location: [facility];  Service: Orthopedics;  Laterality: Left;  . TRANSURETHRAL RESECTION OF BLADDER  x3 --  2009; 2010; 06-29-2010  . TRANSURETHRAL RESECTION OF BLADDER TUMOR  10/03/2011   Procedure: TRANSURETHRAL RESECTION OF BLADDER TUMOR (TURBT);  Surgeon: Molli Hazard, MD;  Location: [facility];  Service: Urology;  Laterality: N/A;  Allergies as of 11/17/2017      Reactions   Flagyl [metronidazole] Hives   Lisinopril Cough      Medication List        Accurate as of 11/17/17 11:59 PM. Always use your most recent med list.          acetaminophen 500 MG tablet Commonly known as:   TYLENOL Take 500 mg by mouth every 6 (six) hours as needed for headache (pain).   amiodarone 200 MG tablet Commonly known as:  PACERONE Take 1 tablet (200 mg total) by mouth daily.   amLODipine 2.5 MG tablet Commonly known as:  NORVASC Take 1 tablet (2.5 mg total) by mouth daily.   apixaban 5 MG Tabs tablet Commonly known as:  ELIQUIS Take 1 tablet (5 mg total) by mouth 2 (two) times daily.   furosemide 20 MG tablet Commonly known as:  LASIX Take 1 tablet (20 mg total) by mouth daily.   levothyroxine 50 MCG tablet Commonly known as:  SYNTHROID, LEVOTHROID Take 50 mcg by mouth daily before breakfast.   metoprolol tartrate 25 MG tablet Commonly known as:  LOPRESSOR Take 1 tablet (25 mg total) by mouth 2 (two) times daily.   oxyCODONE-acetaminophen 5-325 MG tablet Commonly known as:  PERCOCET/ROXICET Take 1 tablet by mouth every 6 (six) hours as needed for moderate pain or severe pain.   polyethylene glycol packet Commonly known as:  MIRALAX / GLYCOLAX Take 17 g by mouth daily as needed for mild constipation.       No orders of the defined types were placed in this encounter.    There is no immunization history on file for this patient.  Social History   Tobacco Use  . Smoking status: Former Smoker    Years: 10.00    Types: Cigarettes    Last attempt to quit: 09/25/2005    Years since quitting: 12.1  . Smokeless tobacco: Never Used  Substance Use Topics  . Alcohol use: Yes    Family history is   Family History  Problem Relation Age of Onset  . Breast cancer Sister   . Stomach cancer Sister   . Hernia Sister 31       HERNIA SURGERY COMPLICATIONS  . Colon cancer Neg Hx       Review of Systems  DATA OBTAINED: from patient, nurse GENERAL:  no fevers, fatigue, appetite changes SKIN: No itching, or rash EYES: No eye pain, redness, discharge EARS: No earache, tinnitus, change in hearing NOSE: No congestion, drainage or bleeding  MOUTH/THROAT: No mouth  or tooth pain, No sore throat RESPIRATORY: No cough, wheezing, SOB CARDIAC: No chest pain, palpitations, lower extremity edema  GI: No abdominal pain, No N/V/D or constipation, No heartburn or reflux  GU: No dysuria, frequency or urgency, or incontinence  MUSCULOSKELETAL: No unrelieved bone/joint pain NEUROLOGIC: No headache, dizziness or focal weakness PSYCHIATRIC: No c/o anxiety or sadness   Vitals:   11/21/17 2036  BP: 116/61  Pulse: (!) 57  Resp: (!) 8  Temp: 97.7 F (36.5 C)    SpO2 Readings from Last 1 Encounters:  11/16/17 95%   There is no height or weight on file to calculate BMI.     Physical Exam  GENERAL APPEARANCE: Alert, conversant,  No acute distress.  SKIN: No diaphoresis rash HEAD: Normocephalic, atraumatic  EYES: Conjunctiva/lids clear. Pupils round, reactive. EOMs intact.  EARS: External exam WNL, canals clear. Hearing grossly normal.  NOSE: No deformity or discharge.  MOUTH/THROAT: Lips  w/o lesions  RESPIRATORY: Breathing is even, unlabored. Lung sounds are clear   CARDIOVASCULAR: Heart RRR no murmurs, rubs or gallops. No peripheral edema.   GASTROINTESTINAL: Abdomen is soft, non-tender, not distended w/ normal bowel sounds. GENITOURINARY: Bladder non tender, not distended  MUSCULOSKELETAL: No abnormal joints or musculature NEUROLOGIC:  Cranial nerves 2-12 grossly intact. Moves all extremities  PSYCHIATRIC: Mood and affect appropriate to situation, no behavioral issues  Patient Active Problem List   Diagnosis Date Noted  . AKI (acute kidney injury) ([place]) 11/15/2017  . Cardiorenal syndrome with renal failure 11/15/2017  . Acute diastolic CHF (congestive heart failure) ([place]) 11/12/2017  . Anemia 11/12/2017  . CHF (congestive heart failure) ([place]) 11/12/2017  . Fall 08/25/2017  . Closed left ankle fracture 08/25/2017  . Chronic diastolic CHF (congestive heart failure) ([place]) 08/25/2017  . Closed avulsion fracture of medial malleolus of left tibia     . Fall at home   . Current use of long term anticoagulation 08/11/2017  . Atrial fibrillation, chronic ([place]) 08/06/2017  . Hypothyroid 06/13/2017  . Palpitations 06/13/2017  . Abnormal EKG 06/13/2017  . Heart murmur 06/13/2017  . S/P laminectomy 07/10/2013  . HTN (hypertension) 07/10/2013  . BLADDER CANCER 12/17/2008  . DIVERTICULOSIS OF COLON 12/12/2008  . COLONIC POLYPS, HYPERPLASTIC, HX OF 12/12/2008      Labs reviewed: Basic Metabolic Panel:    Component Value Date/Time   NA 140 11/21/2017   K 4.9 11/21/2017   CL 102 11/15/2017 0519   CO2 27 11/15/2017 0519   GLUCOSE 87 11/15/2017 0519   BUN 25 (A) 11/21/2017   CREATININE 0.7 11/21/2017   CREATININE 0.82 11/15/2017 0519   CALCIUM 8.7 (L) 11/15/2017 0519   PROT 6.1 (L) 11/13/2017 0528   ALBUMIN 3.3 (L) 11/13/2017 0528   AST 15 11/13/2017 0528   ALT 12 (L) 11/13/2017 0528   ALKPHOS 72 11/13/2017 0528   BILITOT 0.7 11/13/2017 0528   GFRNONAA >60 11/15/2017 0519   GFRAA >60 11/15/2017 0519    Recent Labs    08/07/17 0339  11/13/17 0528 11/14/17 0507 11/15/17 0519 11/21/17  NA 138   < > 140 139 138 140  K 4.1   < > 3.8 3.8 4.0 4.9  CL 107   < > 104 101 102  --   CO2 26   < > 29 29 27   --   GLUCOSE 95   < > 86 91 87  --   BUN 17   < > 22* 25* 29* 25*  CREATININE 0.87   < > 0.95 0.91 0.82 0.7  CALCIUM 8.5*   < > 8.6* 8.6* 8.7*  --   MG 1.8  --   --  2.0  --   --    < > = values in this interval not displayed.   Liver Function Tests: Recent Labs    11/12/17 1747 11/13/17 0528  AST 16 15  ALT 21 12*  ALKPHOS 76 72  BILITOT 0.7 0.7  PROT 6.2* 6.1*  ALBUMIN 3.2* 3.3*   No results for input(s): LIPASE, AMYLASE in the last 8760 hours. No results for input(s): AMMONIA in the last 8760 hours. CBC: Recent Labs    08/07/17 0339  08/25/17 1320  08/29/17 0505  11/13/17 0528 11/15/17 0519 11/21/17  WBC 6.7   < > 11.2*   < > 8.0  --  6.9 6.6 7.5  NEUTROABS 4.7  --  9.1*  --   --   --   --  4.7  --    HGB 12.3   < > 14.1   < > 11.1*   < > 10.9* 10.8* 11.7*  HCT 38.0   < > 41.4   < > 34.4*   < > 34.0* 33.6* 36  MCV 97.7   < > 94.1   < > 97.2  --  94.7 94.4  --   PLT 175   < > 229   < > 184  --  216 234 224   < > = values in this interval not displayed.   Lipid No results for input(s): CHOL, HDL, LDLCALC, TRIG in the last 8760 hours.  Cardiac Enzymes: Recent Labs    11/13/17 0018 11/13/17 0528  TROPONINI <0.03 <0.03   BNP: Recent Labs    08/06/17 1500 08/25/17 1912 11/12/17 1748  BNP 1,080.8* 1,571.7* 871.5*   No results found for: MICROALBUR No results found for: HGBA1C Lab Results  Component Value Date   TSH 2.929 08/06/2017   Lab Results  Component Value Date   VITAMINB12 184 11/15/2017   Lab Results  Component Value Date   FOLATE 9.9 11/15/2017   Lab Results  Component Value Date   IRON 43 11/15/2017   TIBC 315 11/15/2017   FERRITIN 128 11/15/2017    Imaging and Procedures obtained prior to SNF admission: Dg Chest Port 1 View  Result Date: 11/12/2017 CLINICAL DATA:  She was seen at [facility] walk-in clinic today and was found to have low SPO2. Her family are with her and tell me that pt. Has had hx of these symptoms "but it was worse and she had to be admitted to the hospital--the doctors said it is a fluid build-up".; hx ureter CA; ex smoker EXAM: PORTABLE CHEST 1 VIEW COMPARISON:  Exam performed earlier today. FINDINGS: Cardiac silhouette is mildly enlarged. No mediastinal or hilar masses. There prominent bronchovascular markings. Hazy opacities noted at both lung bases consistent with pleural effusions with associated atelectasis. No pneumothorax. Skeletal structures are demineralized but grossly intact. IMPRESSION: Findings consistent with mild congestive heart failure without significant change from the earlier exam. Electronically Signed   By: Lajean Manes M.D.   On: 11/12/2017 18:18     Not all labs, radiology exams or other studies done during  hospitalization come through on my EPIC note; however they are reviewed by me.    Assessment and Plan  Acute on chronic diastolic congestive heart failure-estimated dry weight is 74-75 kg, on admission weight 76 kg cardiology was consulted and patient was started on IV Lasix and diuresed well down to a dry weight of 74 kg SNF - patient admitted for OT/PT; patient was on Lasix 20 mg prior to admission; patient would like to be on a higher dose to avoid readmission and I think this is not unreasonable so we will start Lasix 40 mg daily with a BMP weekly to monitor potassium and renal function  Paroxysmal atrial fibrillation-currently in normal sinus rhythm SNF - control with amiodarone 200 mg by mouth daily and metoprolol 25 mg twice a day and prophylaxis wEliquis 5 mgtwice a day  Hypertension SNF - controlled; continue Norvasc 2.5 mg by mouth daily and metoprolol 25 mg twice a day  Hypothyroidism SNF - TSH 2.9; continue Synthroid 50 g by mouth daily  Normocytic anemia SNF - hemoglobin10;followupCBC   Time spent greater than 45 minutes;> 50% of time with patient was spent reviewing records, labs, tests and studies, counseling and developing plan of care  Webb Silversmith  Sheppard Coil, MD

## 2017-11-27 NOTE — Progress Notes (Signed)
Cardiology Office Note   Date:  11/28/2017   ID:  Rebecca Norman, DOB [birth year], MRN [id number]  PCP:  [name], MD  Cardiologist:  [facility]   Chief Complaint  Patient presents with  . Hospitalization Follow-up  . Congestive Heart Failure     History of Present Illness: Rebecca Norman is a 84 y.o. female who presents for posthospitalization follow-up who we saw on consultation the setting of PAF and congestive heart failure.  The patient was on metoprolol and Eliquis, most recent echocardiogram revealed preserved LVEF with mild LVH.  The patient did have a cardioversion in August 22, 2017, post cardioversion she had runs of PAT and amiodarone was added and metoprolol was discontinued.  During hospitalization the patient was found to be decompensated after admission for increasing weakness and cough.  She was placed on IV diuresis, with the diuresis of 1350 cc.  Metoprolol was restarted at 12.5 mg twice daily.  On discharge she was Lasix daily, continue amiodarone 200 mg daily, along with Eliquis.  She comes today without any complaints other than feeling frustrated that she is not moving more quickly and her physical therapy.  She is currently at [facility].  She had gone home temporarily and began to have some shortness of breath and required her to return to the hospital diuresis.  She is frustrated that she is not moving quickly concerning her physical therapy advancing her activity level.  She has been working hard is able to walk to the bedside commode with her walker now which she was unable to do in the past. She is hoping to return home couple of weeks.  She denies dyspnea, weakness, chest pain, or dizziness.  Past Medical History:  Diagnosis Date  . DDD (degenerative disc disease), cervical    severe/  auto fusion c2-c5  . Diverticulosis   . Hemorrhoids   . History of adenomatous polyp of colon    1995  adenomatous  polyp/  1997 & 2003  hyperplastic polyp's  . History of bladder cancer urologist-  dr [name]   s/p  TURBT's  . History of cancer of ureter   . Hypertension   . Hypothyroid   . Nocturia   . Osteoarthritis   . Osteopenia     Past Surgical History:  Procedure Laterality Date  . BACK SURGERY    . CARDIOVERSION N/A 08/09/2017   Procedure: CARDIOVERSION;  Surgeon: Larey Dresser, MD;  Location[facility] ENDOSCOPY;  Service: Cardiovascular;  Laterality: N/A;  . CATARACT EXTRACTION W/ INTRAOCULAR LENS  IMPLANT, BILATERAL Bilateral   . CYSTOSCOPY W/ RETROGRADES  10/03/2011   Procedure: CYSTOSCOPY WITH RETROGRADE PYELOGRAM;  Surgeon: Molli Hazard, MD;  Location: [facility];  Service: Urology;;  . Consuela Mimes W/ RETROGRADES Bilateral 06/17/2016   Procedure: CYSTOSCOPY WITH RETROGRADE PYELOGRAM;  Surgeon: Alexis Frock, MD;  Location: [facility];  Service: Urology;  Laterality: Bilateral;  . CYSTOSCOPY WITH BIOPSY N/A 06/17/2016   Procedure: CYSTOSCOPY WITH BIOPSY AND FULGERATION;  Surgeon: Alexis Frock, MD;  Location: [facility];  Service: Urology;  Laterality: N/A;  . LAMINECTOMY  2009   L3 - 4  . ORIF ANKLE FRACTURE Left 08/28/2017   Procedure: OPEN REDUCTION INTERNAL FIXATION (ORIF) ANKLE FRACTURE;  Surgeon: Shona Needles, MD;  Location: [facility];  Service: Orthopedics;  Laterality: Left;  . TRANSURETHRAL RESECTION OF BLADDER  x3 --  2009; 2010; 06-29-2010  . TRANSURETHRAL RESECTION OF BLADDER  TUMOR  10/03/2011   Procedure: TRANSURETHRAL RESECTION OF BLADDER TUMOR (TURBT);  Surgeon: Molli Hazard, MD;  Location: [facility];  Service: Urology;  Laterality: N/A;     Current Outpatient Medications  Medication Sig Dispense Refill  . acetaminophen (TYLENOL) 500 MG tablet Take 500 mg by mouth every 6 (six) hours as needed for headache (pain).     Marland Kitchen amiodarone (PACERONE) 200 MG tablet Take 1 tablet (200 mg total) by mouth  daily. 90 tablet 3  . amLODipine (NORVASC) 2.5 MG tablet Take 1 tablet (2.5 mg total) by mouth daily. 180 tablet 3  . apixaban (ELIQUIS) 5 MG TABS tablet Take 1 tablet (5 mg total) by mouth 2 (two) times daily. 180 tablet 1  . furosemide (LASIX) 20 MG tablet Take 1 tablet (20 mg total) by mouth daily. 90 tablet 3  . levothyroxine (SYNTHROID, LEVOTHROID) 50 MCG tablet Take 50 mcg by mouth daily before breakfast.    . metoprolol tartrate (LOPRESSOR) 25 MG tablet Take 1 tablet (25 mg total) by mouth 2 (two) times daily. 180 tablet 3   No current facility-administered medications for this visit.     Allergies:   Flagyl [metronidazole] and Lisinopril    Social History:  The patient  reports that she quit smoking about 12 years ago. Her smoking use included cigarettes. She quit after 10.00 years of use. She has never used smokeless tobacco. She reports that she drinks alcohol. She reports that she does not use drugs.   Family History:  The patient's family history includes Breast cancer in her sister; Hernia (age of onset: 42) in her sister; Stomach cancer in her sister.    ROS: All other systems are reviewed and negative. Unless otherwise mentioned in H&P    PHYSICAL EXAM: VS:  BP (!) 110/54 (BP Location: Right Arm, Patient Position: Sitting, Cuff Size: Normal)   Pulse 89   Wt 169 lb (76.7 kg) Comment: Pt reported  SpO2 98% Comment: On 2 L  BMI 29.01 kg/m  , BMI Body mass index is 29.01 kg/m. GEN: Well nourished, well developed, in no acute distress sitting in a wheelchair HEENT: normal  Neck: no JVD, carotid bruits, or masses Cardiac: RRR; 1/6 systolic murmurs, rubs, or gallops,no edema  Respiratory:  clear to auscultation bilaterally, normal work of breathing GI: soft, nontender, nondistended, + BS MS: no deformity or atrophy  Skin: warm and dry, no rash Neuro:  Strength and sensation are intact Psych: euthymic mood, full affect   EKG:  not ordered today.  Recent  Labs: 08/06/2017: TSH 2.929 11/12/2017: B Natriuretic Peptide 871.5 11/13/2017: ALT 12 11/14/2017: Magnesium 2.0 11/21/2017: BUN 25; Creatinine 0.7; Hemoglobin 11.7; Platelets 224; Potassium 4.9; Sodium 140    Lipid Panel No results found for: CHOL, TRIG, HDL, CHOLHDL, VLDL, LDLCALC, LDLDIRECT    Wt Readings from Last 3 Encounters:  11/28/17 169 lb (76.7 kg)  11/16/17 163 lb 1.6 oz (74 kg)  08/25/17 168 lb 14 oz (76.6 kg)      Other studies Reviewed: Echo 11/14/2017 Left ventricle: The cavity size was normal. There was severe   concentric hypertrophy. Systolic function was vigorous. The   estimated ejection fraction was in the range of 65% to 70%. Wall   motion was normal; there were no regional wall motion   abnormalities. Doppler parameters are consistent with high   ventricular filling pressure. - Aortic valve: There was very mild stenosis. There was trivial   regurgitation. Peak velocity (S): 215  cm/s. Mean gradient (S): 11   mm Hg. Valve area (Vmax): 1.94 cm^2. - Mitral valve: Severely calcified annulus. Mildly thickened   leaflets . There was mild regurgitation. - Left atrium: The atrium was moderately dilated.  ASSESSMENT AND PLAN:  1.  Acute on chronic diastolic heart failure: Currently appears euvolemic.  The patient will continue daily weights, with reporting of weight gain greater than 3-5 pounds in 24 hours.  She will continue to adhere to a low-sodium diet.  Once returning home she should have a home scale which can be set up prior to discharge from [facility].  No changes in her medication regimen.  We will follow-up with a BMET to evaluate potassium status kidney function on Lasix therapy.  2.  Paroxysmal atrial fibrillation: Heart rate remains regular, she denies any bleeding episodes or excessive bruising.  We will check a CBC as she is on anticoagulation therapy.  3.  Mild dyspnea on exertion: I believe this is related to overall deconditioning.  She is not taking  deep breaths.  I have ordered an incentive spirometer for her to use at home and at skilled nursing facility to help to exercise lungs and increase stamina.   4.Hypertension: Blood pressure is adequately controlled.  No changes in her medication regimen.   Current medicines are reviewed at length with the patient today.    Labs/ tests ordered today include: BMET, CBC Phill Myron. [place] Pugh, ANP, AACC   11/28/2017 12:14 PM    [facility] [address] [place], [place] [address] Phone: [phone]; Fax: [phone]

## 2017-11-28 ENCOUNTER — Ambulatory Visit (INDEPENDENT_AMBULATORY_CARE_PROVIDER_SITE_OTHER): Payer: Medicare Other | Admitting: Adult Health

## 2017-11-28 ENCOUNTER — Encounter: Payer: Self-pay | Admitting: Adult Health

## 2017-11-28 VITALS — BP 110/54 | HR 89 | Ht 62.0 in | Wt 169.0 lb

## 2017-11-28 DIAGNOSIS — Z79899 Other long term (current) drug therapy: Secondary | ICD-10-CM

## 2017-11-28 DIAGNOSIS — N179 Acute kidney failure, unspecified: Secondary | ICD-10-CM | POA: Diagnosis not present

## 2017-11-28 DIAGNOSIS — I10 Essential (primary) hypertension: Secondary | ICD-10-CM

## 2017-11-28 DIAGNOSIS — I480 Paroxysmal atrial fibrillation: Secondary | ICD-10-CM | POA: Diagnosis not present

## 2017-11-28 DIAGNOSIS — D649 Anemia, unspecified: Secondary | ICD-10-CM

## 2017-11-28 DIAGNOSIS — I5032 Chronic diastolic (congestive) heart failure: Secondary | ICD-10-CM

## 2017-11-28 LAB — CBC
HEMATOCRIT: 34.8 % (ref 34.0–46.6)
Hemoglobin: 11.5 g/dL (ref 11.1–15.9)
MCH: 30.1 pg (ref 26.6–33.0)
MCHC: 33 g/dL (ref 31.5–35.7)
MCV: 91 fL (ref 79–97)
PLATELETS: 265 10*3/uL (ref 150–379)
RBC: 3.82 x10E6/uL (ref 3.77–5.28)
RDW: 14.1 % (ref 12.3–15.4)
WBC: 8.2 10*3/uL (ref 3.4–10.8)

## 2017-11-28 LAB — BASIC METABOLIC PANEL
BUN / CREAT RATIO: 20 (ref 12–28)
BUN: 19 mg/dL (ref 10–36)
CHLORIDE: 99 mmol/L (ref 96–106)
CO2: 26 mmol/L (ref 20–29)
Calcium: 8.9 mg/dL (ref 8.7–10.3)
Creatinine, Ser: 0.96 mg/dL (ref 0.57–1.00)
GFR calc non Af Amer: 52 mL/min/{1.73_m2} — ABNORMAL LOW (ref >59)
GFR, EST AFRICAN AMERICAN: 60 mL/min/{1.73_m2} (ref >59)
GLUCOSE: 76 mg/dL (ref 65–99)
POTASSIUM: 4.8 mmol/L (ref 3.5–5.2)
Sodium: 139 mmol/L (ref 134–144)

## 2017-11-28 NOTE — Patient Instructions (Signed)
Medication Instructions:  NO CHANGES- Your physician recommends that you continue on your current medications as directed. Please refer to the Current Medication list given to you today.  If you need a refill on your cardiac medications before your next appointment, please call your pharmacy.  Labwork: BMET AND CBC TODAY HERE IN OUR OFFICE AT [facility]  Take the provided lab slips for you to take with you to the lab for you blood draw.   Follow-Up: Your physician wants you to follow-up in: 2 [place] ([place]), DNP.   Thank you for choosing [facility] at [facility]!!

## 2017-12-01 ENCOUNTER — Telehealth: Payer: Self-pay | Admitting: Adult Health

## 2017-12-01 NOTE — Telephone Encounter (Signed)
°  New message  Pt verbalized that she is calling for RN  She wants her lab results

## 2017-12-01 NOTE — Telephone Encounter (Signed)
Notes recorded by Lendon Colonel, NP on 11/28/2017 at 4:47 PM EDT All labs reviewed. Essentially normal. No changes in her regimen.    Patient aware and verbalized understanding.

## 2017-12-04 ENCOUNTER — Encounter: Payer: Self-pay | Admitting: Internal Medicine

## 2017-12-04 ENCOUNTER — Non-Acute Institutional Stay (SKILLED_NURSING_FACILITY): Payer: Medicare Other | Admitting: Internal Medicine

## 2017-12-04 DIAGNOSIS — E034 Atrophy of thyroid (acquired): Secondary | ICD-10-CM

## 2017-12-04 DIAGNOSIS — I480 Paroxysmal atrial fibrillation: Secondary | ICD-10-CM

## 2017-12-04 DIAGNOSIS — I5033 Acute on chronic diastolic (congestive) heart failure: Secondary | ICD-10-CM | POA: Diagnosis not present

## 2017-12-04 DIAGNOSIS — I10 Essential (primary) hypertension: Secondary | ICD-10-CM

## 2017-12-04 DIAGNOSIS — D649 Anemia, unspecified: Secondary | ICD-10-CM | POA: Diagnosis not present

## 2017-12-04 NOTE — Progress Notes (Signed)
Location:  [place] Room Number: 110P Place of Service:  SNF (31) Noah Delaine. Sheppard Coil, MD  PCP: [name], MD Patient Care Team: [name], MD as PCP - General (Family Medicine) [name] [name], MD as PCP - Cardiology (Cardiology)  Extended Emergency Contact Information Primary Emergency Contact: Hermela, Hardt Mobile Phone: [phone] Relation: Son Secondary Emergency Contact: [name] [place] Phone: [phone] Mobile Phone: [phone] Relation: Daughter  Allergies  Allergen Reactions  . Flagyl [Metronidazole] Hives  . Lisinopril Cough    Chief Complaint  Patient presents with  . Discharge Note    Discharged from SNF    HPI:  84 y.o. female with hypertension, hypothyroidism, who presented to the emergency department with shortness of breath for the last 3 days.  She was apparently seen at PCPs office and found to be hypoxic and transferred to ER by private vehicle.  Patient was admitted to [facility] from 3/10-14 for acute on chronic congestive heart failure.  Patient was diuresed with IV Lasix down to a dry weight of 74 kg.  All other medical problems remained stable.  Patient was admitted to skilled nursing facility for OT/PT and is now ready to be discharged to home.    Past Medical History:  Diagnosis Date  . DDD (degenerative disc disease), cervical    severe/  auto fusion c2-c5  . Diverticulosis   . Hemorrhoids   . History of adenomatous polyp of colon    1995  adenomatous polyp/  1997 & 2003  hyperplastic polyp's  . History of bladder cancer urologist-  dr [name]   s/p  TURBT's  . History of cancer of ureter   . Hypertension   . Hypothyroid   . Nocturia   . Osteoarthritis   . Osteopenia     Past Surgical History:  Procedure Laterality Date  . BACK SURGERY    . CARDIOVERSION N/A 08/09/2017   Procedure: CARDIOVERSION;  Surgeon: Larey Dresser, MD;  Location: [facility]  ENDOSCOPY;  Service: Cardiovascular;  Laterality: N/A;  . CATARACT EXTRACTION W/ INTRAOCULAR LENS  IMPLANT, BILATERAL Bilateral   . CYSTOSCOPY W/ RETROGRADES  10/03/2011   Procedure: CYSTOSCOPY WITH RETROGRADE PYELOGRAM;  Surgeon: Molli Hazard, MD;  Location: [facility];  Service: Urology;;  . Consuela Mimes W/ RETROGRADES Bilateral 06/17/2016   Procedure: CYSTOSCOPY WITH RETROGRADE PYELOGRAM;  Surgeon: Alexis Frock, MD;  Location: [facility];  Service: Urology;  Laterality: Bilateral;  . CYSTOSCOPY WITH BIOPSY N/A 06/17/2016   Procedure: CYSTOSCOPY WITH BIOPSY AND FULGERATION;  Surgeon: Alexis Frock, MD;  Location: [facility];  Service: Urology;  Laterality: N/A;  . LAMINECTOMY  2009   L3 - 4  . ORIF ANKLE FRACTURE Left 08/28/2017   Procedure: OPEN REDUCTION INTERNAL FIXATION (ORIF) ANKLE FRACTURE;  Surgeon: Shona Needles, MD;  Location: [facility];  Service: Orthopedics;  Laterality: Left;  . TRANSURETHRAL RESECTION OF BLADDER  x3 --  2009; 2010; 06-29-2010  . TRANSURETHRAL RESECTION OF BLADDER TUMOR  10/03/2011   Procedure: TRANSURETHRAL RESECTION OF BLADDER TUMOR (TURBT);  Surgeon: Molli Hazard, MD;  Location: [facility];  Service: Urology;  Laterality: N/A;     reports that she quit smoking about 12 years ago. Her smoking use included cigarettes. She quit after 10.00 years of use. She has never used smokeless tobacco. She reports that she drinks alcohol. She reports that she does not use drugs. Social History  Socioeconomic History  . Marital status: Widowed    Spouse name: Not on file  . Number of children: Not on file  . Years of education: Not on file  . Highest education level: Not on file  Occupational History  . Occupation: Retired  Scientific laboratory technician  . Financial resource strain: Not on file  . Food insecurity:    Worry: Not on file    Inability: Not on file  . Transportation needs:    Medical: Not  on file    Non-medical: Not on file  Tobacco Use  . Smoking status: Former Smoker    Years: 10.00    Types: Cigarettes    Last attempt to quit: 09/25/2005    Years since quitting: 12.2  . Smokeless tobacco: Never Used  Substance and Sexual Activity  . Alcohol use: Yes  . Drug use: No  . Sexual activity: Not on file  Lifestyle  . Physical activity:    Days per week: Not on file    Minutes per session: Not on file  . Stress: Not on file  Relationships  . Social connections:    Talks on phone: Not on file    Gets together: Not on file    Attends religious service: Not on file    Active member of club or organization: Not on file    Attends meetings of clubs or organizations: Not on file    Relationship status: Not on file  . Intimate partner violence:    Fear of current or ex partner: Not on file    Emotionally abused: Not on file    Physically abused: Not on file    Forced sexual activity: Not on file  Other Topics Concern  . Not on file  Social History Narrative  . Not on file    Pertinent  Health Maintenance Due  Topic Date Due  . DEXA SCAN  07/05/1992  . PNA vac Low Risk Adult (1 of 2 - PCV13) 07/05/1992  . INFLUENZA VACCINE  04/05/2018    Medications: Allergies as of 12/04/2017      Reactions   Flagyl [metronidazole] Hives   Lisinopril Cough      Medication List        Accurate as of 12/04/17 11:59 PM. Always use your most recent med list.          acetaminophen 500 MG tablet Commonly known as:  TYLENOL Take 500 mg by mouth every 6 (six) hours as needed for headache (pain).   amiodarone 200 MG tablet Commonly known as:  PACERONE Take 1 tablet (200 mg total) by mouth daily.   amLODipine 2.5 MG tablet Commonly known as:  NORVASC Take 2.5 mg by mouth daily.   apixaban 5 MG Tabs tablet Commonly known as:  ELIQUIS Take 1 tablet (5 mg total) by mouth 2 (two) times daily.   furosemide 40 MG tablet Commonly known as:  LASIX Take 40 mg by mouth  daily.   levothyroxine 50 MCG tablet Commonly known as:  SYNTHROID, LEVOTHROID Take 50 mcg by mouth daily before breakfast.   metoprolol tartrate 25 MG tablet Commonly known as:  LOPRESSOR Take 25 mg by mouth 2 (two) times daily.   polyethylene glycol packet Commonly known as:  MIRALAX / GLYCOLAX Take 17 g by mouth daily as needed for mild constipation.        Vitals:   12/04/17 1125  BP: 140/60  Pulse: 66  Resp: 20  Temp: (!) 97.1 F (36.2 C)  TempSrc: Oral  SpO2: 96%  Weight: 170 lb 4.8 oz (77.2 kg)  Height: 5\' 4"  (1.626 m)   Body mass index is 29.23 kg/m.  Physical Exam  GENERAL APPEARANCE: Alert, conversant. No acute distress.  HEENT: Unremarkable. RESPIRATORY: Breathing is even, unlabored. Lung sounds are clear   CARDIOVASCULAR: Heart RRR no murmurs, rubs or gallops. No peripheral edema.  GASTROINTESTINAL: Abdomen is soft, non-tender, not distended w/ normal bowel sounds.  NEUROLOGIC: Cranial nerves 2-12 grossly intact. Moves all extremities   Labs reviewed: Basic Metabolic Panel: Recent Labs    08/07/17 0339  11/14/17 0507 11/15/17 0519 11/21/17 11/28/17 1045  NA 138   < > 139 138 140 139  K 4.1   < > 3.8 4.0 4.9 4.8  CL 107   < > 101 102  --  99  CO2 26   < > 29 27  --  26  GLUCOSE 95   < > 91 87  --  76  BUN 17   < > 25* 29* 25* 19  CREATININE 0.87   < > 0.91 0.82 0.7 0.96  CALCIUM 8.5*   < > 8.6* 8.7*  --  8.9  MG 1.8  --  2.0  --   --   --    < > = values in this interval not displayed.   No results found for: [facility] Liver Function Tests: Recent Labs    11/12/17 1747 11/13/17 0528  AST 16 15  ALT 21 12*  ALKPHOS 76 72  BILITOT 0.7 0.7  PROT 6.2* 6.1*  ALBUMIN 3.2* 3.3*   No results for input(s): LIPASE, AMYLASE in the last 8760 hours. No results for input(s): AMMONIA in the last 8760 hours. CBC: Recent Labs    08/07/17 0339  08/25/17 1320  11/13/17 0528 11/15/17 0519 11/21/17 11/28/17 1045  WBC 6.7   < > 11.2*   < >  6.9 6.6 7.5 8.2  NEUTROABS 4.7  --  9.1*  --   --  4.7  --   --   HGB 12.3   < > 14.1   < > 10.9* 10.8* 11.7* 11.5  HCT 38.0   < > 41.4   < > 34.0* 33.6* 36 34.8  MCV 97.7   < > 94.1   < > 94.7 94.4  --  91  PLT 175   < > 229   < > 216 234 224 265   < > = values in this interval not displayed.   Lipid No results for input(s): CHOL, HDL, LDLCALC, TRIG in the last 8760 hours. Cardiac Enzymes: Recent Labs    11/13/17 0018 11/13/17 0528  TROPONINI <0.03 <0.03   BNP: Recent Labs    08/06/17 1500 08/25/17 1912 11/12/17 1748  BNP 1,080.8* 1,571.7* 871.5*   CBG: No results for input(s): GLUCAP in the last 8760 hours.  Procedures and Imaging Studies During Stay: Dg Chest Port 1 View  Result Date: 11/12/2017 CLINICAL DATA:  She was seen at [facility] walk-in clinic today and was found to have low SPO2. Her family are with her and tell me that pt. Has had hx of these symptoms "but it was worse and she had to be admitted to the hospital--the doctors said it is a fluid build-up".; hx ureter CA; ex smoker EXAM: PORTABLE CHEST 1 VIEW COMPARISON:  Exam performed earlier today. FINDINGS: Cardiac silhouette is mildly enlarged. No mediastinal or hilar masses. There prominent bronchovascular markings. Hazy opacities noted at both lung bases  consistent with pleural effusions with associated atelectasis. No pneumothorax. Skeletal structures are demineralized but grossly intact. IMPRESSION: Findings consistent with mild congestive heart failure without significant change from the earlier exam. Electronically Signed   By: Lajean Manes M.D.   On: 11/12/2017 18:18    Assessment/Plan:   Acute on chronic diastolic (congestive) heart failure (HCC)  Essential hypertension  Hypothyroidism due to acquired atrophy of thyroid  Paroxysmal atrial fibrillation (HCC)  Normocytic anemia   Patient is being discharged with the following home health services: OT/PT/nursing  Patient is being discharged with the  following durable medical equipment: Stationary and portable gaseous oxygen at 2 L/min via nasal cannula  Patient has been advised to f/u with their PCP in 1-2 weeks to bring them up to date on their rehab stay.  Social services at facility was responsible for arranging this appointment.  Pt was provided with a 30 day supply of prescriptions for medications and refills must be obtained from their PCP.  For controlled substances, a more limited supply may be provided adequate until PCP appointment only.  Medications  have been reconciled.  Spent greater than 30 minutes;> 50% of time with patient was spent reviewing records, labs, tests and studies, counseling and developing plan of care  Noah Delaine. Sheppard Coil, MD

## 2017-12-08 DIAGNOSIS — I482 Chronic atrial fibrillation: Secondary | ICD-10-CM | POA: Diagnosis not present

## 2017-12-08 DIAGNOSIS — I110 Hypertensive heart disease with heart failure: Secondary | ICD-10-CM | POA: Diagnosis not present

## 2017-12-08 DIAGNOSIS — I700 Atherosclerosis of aorta: Secondary | ICD-10-CM | POA: Diagnosis not present

## 2017-12-08 DIAGNOSIS — S8252XD Displaced fracture of medial malleolus of left tibia, subsequent encounter for closed fracture with routine healing: Secondary | ICD-10-CM | POA: Diagnosis not present

## 2017-12-08 DIAGNOSIS — I5032 Chronic diastolic (congestive) heart failure: Secondary | ICD-10-CM | POA: Diagnosis not present

## 2017-12-08 DIAGNOSIS — S82832D Other fracture of upper and lower end of left fibula, subsequent encounter for closed fracture with routine healing: Secondary | ICD-10-CM | POA: Diagnosis not present

## 2017-12-12 DIAGNOSIS — I482 Chronic atrial fibrillation: Secondary | ICD-10-CM | POA: Diagnosis not present

## 2017-12-12 DIAGNOSIS — I5032 Chronic diastolic (congestive) heart failure: Secondary | ICD-10-CM | POA: Diagnosis not present

## 2017-12-12 DIAGNOSIS — I110 Hypertensive heart disease with heart failure: Secondary | ICD-10-CM | POA: Diagnosis not present

## 2017-12-12 DIAGNOSIS — S8252XD Displaced fracture of medial malleolus of left tibia, subsequent encounter for closed fracture with routine healing: Secondary | ICD-10-CM | POA: Diagnosis not present

## 2017-12-12 DIAGNOSIS — I700 Atherosclerosis of aorta: Secondary | ICD-10-CM | POA: Diagnosis not present

## 2017-12-12 DIAGNOSIS — S82832D Other fracture of upper and lower end of left fibula, subsequent encounter for closed fracture with routine healing: Secondary | ICD-10-CM | POA: Diagnosis not present

## 2017-12-14 DIAGNOSIS — I110 Hypertensive heart disease with heart failure: Secondary | ICD-10-CM | POA: Diagnosis not present

## 2017-12-14 DIAGNOSIS — I700 Atherosclerosis of aorta: Secondary | ICD-10-CM | POA: Diagnosis not present

## 2017-12-14 DIAGNOSIS — I482 Chronic atrial fibrillation: Secondary | ICD-10-CM | POA: Diagnosis not present

## 2017-12-14 DIAGNOSIS — S82832D Other fracture of upper and lower end of left fibula, subsequent encounter for closed fracture with routine healing: Secondary | ICD-10-CM | POA: Diagnosis not present

## 2017-12-14 DIAGNOSIS — I5032 Chronic diastolic (congestive) heart failure: Secondary | ICD-10-CM | POA: Diagnosis not present

## 2017-12-14 DIAGNOSIS — S8252XD Displaced fracture of medial malleolus of left tibia, subsequent encounter for closed fracture with routine healing: Secondary | ICD-10-CM | POA: Diagnosis not present

## 2017-12-18 DIAGNOSIS — I482 Chronic atrial fibrillation: Secondary | ICD-10-CM | POA: Diagnosis not present

## 2017-12-18 DIAGNOSIS — I700 Atherosclerosis of aorta: Secondary | ICD-10-CM | POA: Diagnosis not present

## 2017-12-18 DIAGNOSIS — I5032 Chronic diastolic (congestive) heart failure: Secondary | ICD-10-CM | POA: Diagnosis not present

## 2017-12-18 DIAGNOSIS — I110 Hypertensive heart disease with heart failure: Secondary | ICD-10-CM | POA: Diagnosis not present

## 2017-12-18 DIAGNOSIS — S8252XD Displaced fracture of medial malleolus of left tibia, subsequent encounter for closed fracture with routine healing: Secondary | ICD-10-CM | POA: Diagnosis not present

## 2017-12-18 DIAGNOSIS — S82832D Other fracture of upper and lower end of left fibula, subsequent encounter for closed fracture with routine healing: Secondary | ICD-10-CM | POA: Diagnosis not present

## 2017-12-19 ENCOUNTER — Telehealth: Payer: Self-pay | Admitting: Adult Health

## 2017-12-19 DIAGNOSIS — I482 Chronic atrial fibrillation: Secondary | ICD-10-CM | POA: Diagnosis not present

## 2017-12-19 DIAGNOSIS — S82832D Other fracture of upper and lower end of left fibula, subsequent encounter for closed fracture with routine healing: Secondary | ICD-10-CM | POA: Diagnosis not present

## 2017-12-19 DIAGNOSIS — S8252XD Displaced fracture of medial malleolus of left tibia, subsequent encounter for closed fracture with routine healing: Secondary | ICD-10-CM | POA: Diagnosis not present

## 2017-12-19 DIAGNOSIS — I5032 Chronic diastolic (congestive) heart failure: Secondary | ICD-10-CM | POA: Diagnosis not present

## 2017-12-19 DIAGNOSIS — I110 Hypertensive heart disease with heart failure: Secondary | ICD-10-CM | POA: Diagnosis not present

## 2017-12-19 DIAGNOSIS — I700 Atherosclerosis of aorta: Secondary | ICD-10-CM | POA: Diagnosis not present

## 2017-12-19 MED ORDER — AMIODARONE HCL 200 MG PO TABS: 200.0000 mg | ORAL_TABLET | Freq: Every day | ORAL | 8 refills | Status: DC

## 2017-12-19 NOTE — Telephone Encounter (Signed)
New message    *STAT* If patient is at the pharmacy, call can be transferred to refill team.   1. Which medications need to be refilled? (please list name of each medication and dose if known) amiodarone (PACERONE) 200 MG tablet  2. Which pharmacy/location (including street and city if local pharmacy) is medication to be sent to?[facility] [address] and [place]  3. Do they need a 30 day or 90 day supply? [place]

## 2017-12-20 DIAGNOSIS — S82832D Other fracture of upper and lower end of left fibula, subsequent encounter for closed fracture with routine healing: Secondary | ICD-10-CM | POA: Diagnosis not present

## 2017-12-20 DIAGNOSIS — I700 Atherosclerosis of aorta: Secondary | ICD-10-CM | POA: Diagnosis not present

## 2017-12-20 DIAGNOSIS — S8252XD Displaced fracture of medial malleolus of left tibia, subsequent encounter for closed fracture with routine healing: Secondary | ICD-10-CM | POA: Diagnosis not present

## 2017-12-20 DIAGNOSIS — I110 Hypertensive heart disease with heart failure: Secondary | ICD-10-CM | POA: Diagnosis not present

## 2017-12-20 DIAGNOSIS — I482 Chronic atrial fibrillation: Secondary | ICD-10-CM | POA: Diagnosis not present

## 2017-12-20 DIAGNOSIS — I5032 Chronic diastolic (congestive) heart failure: Secondary | ICD-10-CM | POA: Diagnosis not present

## 2017-12-21 DIAGNOSIS — D649 Anemia, unspecified: Secondary | ICD-10-CM | POA: Diagnosis not present

## 2017-12-21 DIAGNOSIS — J9691 Respiratory failure, unspecified with hypoxia: Secondary | ICD-10-CM | POA: Diagnosis not present

## 2017-12-21 DIAGNOSIS — N179 Acute kidney failure, unspecified: Secondary | ICD-10-CM | POA: Diagnosis not present

## 2017-12-21 DIAGNOSIS — I350 Nonrheumatic aortic (valve) stenosis: Secondary | ICD-10-CM | POA: Diagnosis not present

## 2017-12-21 DIAGNOSIS — I5030 Unspecified diastolic (congestive) heart failure: Secondary | ICD-10-CM | POA: Diagnosis not present

## 2017-12-21 DIAGNOSIS — L723 Sebaceous cyst: Secondary | ICD-10-CM | POA: Diagnosis not present

## 2017-12-22 DIAGNOSIS — I482 Chronic atrial fibrillation: Secondary | ICD-10-CM | POA: Diagnosis not present

## 2017-12-22 DIAGNOSIS — I110 Hypertensive heart disease with heart failure: Secondary | ICD-10-CM | POA: Diagnosis not present

## 2017-12-22 DIAGNOSIS — S82832D Other fracture of upper and lower end of left fibula, subsequent encounter for closed fracture with routine healing: Secondary | ICD-10-CM | POA: Diagnosis not present

## 2017-12-22 DIAGNOSIS — I5032 Chronic diastolic (congestive) heart failure: Secondary | ICD-10-CM | POA: Diagnosis not present

## 2017-12-22 DIAGNOSIS — I700 Atherosclerosis of aorta: Secondary | ICD-10-CM | POA: Diagnosis not present

## 2017-12-22 DIAGNOSIS — S8252XD Displaced fracture of medial malleolus of left tibia, subsequent encounter for closed fracture with routine healing: Secondary | ICD-10-CM | POA: Diagnosis not present

## 2017-12-26 DIAGNOSIS — I5032 Chronic diastolic (congestive) heart failure: Secondary | ICD-10-CM | POA: Diagnosis not present

## 2017-12-26 DIAGNOSIS — S82832D Other fracture of upper and lower end of left fibula, subsequent encounter for closed fracture with routine healing: Secondary | ICD-10-CM | POA: Diagnosis not present

## 2017-12-26 DIAGNOSIS — I110 Hypertensive heart disease with heart failure: Secondary | ICD-10-CM | POA: Diagnosis not present

## 2017-12-26 DIAGNOSIS — I700 Atherosclerosis of aorta: Secondary | ICD-10-CM | POA: Diagnosis not present

## 2017-12-26 DIAGNOSIS — I482 Chronic atrial fibrillation: Secondary | ICD-10-CM | POA: Diagnosis not present

## 2017-12-26 DIAGNOSIS — S8252XD Displaced fracture of medial malleolus of left tibia, subsequent encounter for closed fracture with routine healing: Secondary | ICD-10-CM | POA: Diagnosis not present

## 2017-12-28 DIAGNOSIS — I5032 Chronic diastolic (congestive) heart failure: Secondary | ICD-10-CM | POA: Diagnosis not present

## 2017-12-28 DIAGNOSIS — S8252XD Displaced fracture of medial malleolus of left tibia, subsequent encounter for closed fracture with routine healing: Secondary | ICD-10-CM | POA: Diagnosis not present

## 2017-12-28 DIAGNOSIS — S82832D Other fracture of upper and lower end of left fibula, subsequent encounter for closed fracture with routine healing: Secondary | ICD-10-CM | POA: Diagnosis not present

## 2017-12-28 DIAGNOSIS — I700 Atherosclerosis of aorta: Secondary | ICD-10-CM | POA: Diagnosis not present

## 2017-12-28 DIAGNOSIS — I110 Hypertensive heart disease with heart failure: Secondary | ICD-10-CM | POA: Diagnosis not present

## 2017-12-28 DIAGNOSIS — I482 Chronic atrial fibrillation: Secondary | ICD-10-CM | POA: Diagnosis not present

## 2018-01-01 DIAGNOSIS — I110 Hypertensive heart disease with heart failure: Secondary | ICD-10-CM | POA: Diagnosis not present

## 2018-01-01 DIAGNOSIS — I482 Chronic atrial fibrillation: Secondary | ICD-10-CM | POA: Diagnosis not present

## 2018-01-01 DIAGNOSIS — S8252XD Displaced fracture of medial malleolus of left tibia, subsequent encounter for closed fracture with routine healing: Secondary | ICD-10-CM | POA: Diagnosis not present

## 2018-01-01 DIAGNOSIS — I5032 Chronic diastolic (congestive) heart failure: Secondary | ICD-10-CM | POA: Diagnosis not present

## 2018-01-01 DIAGNOSIS — I700 Atherosclerosis of aorta: Secondary | ICD-10-CM | POA: Diagnosis not present

## 2018-01-01 DIAGNOSIS — S82832D Other fracture of upper and lower end of left fibula, subsequent encounter for closed fracture with routine healing: Secondary | ICD-10-CM | POA: Diagnosis not present

## 2018-01-02 DIAGNOSIS — S82892D Other fracture of left lower leg, subsequent encounter for closed fracture with routine healing: Secondary | ICD-10-CM | POA: Diagnosis not present

## 2018-01-02 DIAGNOSIS — I130 Hypertensive heart and chronic kidney disease with heart failure and stage 1 through stage 4 chronic kidney disease, or unspecified chronic kidney disease: Secondary | ICD-10-CM | POA: Diagnosis not present

## 2018-01-02 DIAGNOSIS — S8252XD Displaced fracture of medial malleolus of left tibia, subsequent encounter for closed fracture with routine healing: Secondary | ICD-10-CM | POA: Diagnosis not present

## 2018-01-02 DIAGNOSIS — N189 Chronic kidney disease, unspecified: Secondary | ICD-10-CM | POA: Diagnosis not present

## 2018-01-02 DIAGNOSIS — I482 Chronic atrial fibrillation: Secondary | ICD-10-CM | POA: Diagnosis not present

## 2018-01-02 DIAGNOSIS — S82832D Other fracture of upper and lower end of left fibula, subsequent encounter for closed fracture with routine healing: Secondary | ICD-10-CM | POA: Diagnosis not present

## 2018-01-02 DIAGNOSIS — I5033 Acute on chronic diastolic (congestive) heart failure: Secondary | ICD-10-CM | POA: Diagnosis not present

## 2018-01-04 DIAGNOSIS — N189 Chronic kidney disease, unspecified: Secondary | ICD-10-CM | POA: Diagnosis not present

## 2018-01-04 DIAGNOSIS — S82832D Other fracture of upper and lower end of left fibula, subsequent encounter for closed fracture with routine healing: Secondary | ICD-10-CM | POA: Diagnosis not present

## 2018-01-04 DIAGNOSIS — I130 Hypertensive heart and chronic kidney disease with heart failure and stage 1 through stage 4 chronic kidney disease, or unspecified chronic kidney disease: Secondary | ICD-10-CM | POA: Diagnosis not present

## 2018-01-04 DIAGNOSIS — S8252XD Displaced fracture of medial malleolus of left tibia, subsequent encounter for closed fracture with routine healing: Secondary | ICD-10-CM | POA: Diagnosis not present

## 2018-01-04 DIAGNOSIS — I482 Chronic atrial fibrillation: Secondary | ICD-10-CM | POA: Diagnosis not present

## 2018-01-04 DIAGNOSIS — I5033 Acute on chronic diastolic (congestive) heart failure: Secondary | ICD-10-CM | POA: Diagnosis not present

## 2018-01-05 DIAGNOSIS — I130 Hypertensive heart and chronic kidney disease with heart failure and stage 1 through stage 4 chronic kidney disease, or unspecified chronic kidney disease: Secondary | ICD-10-CM | POA: Diagnosis not present

## 2018-01-05 DIAGNOSIS — S82832D Other fracture of upper and lower end of left fibula, subsequent encounter for closed fracture with routine healing: Secondary | ICD-10-CM | POA: Diagnosis not present

## 2018-01-05 DIAGNOSIS — I482 Chronic atrial fibrillation: Secondary | ICD-10-CM | POA: Diagnosis not present

## 2018-01-05 DIAGNOSIS — I5033 Acute on chronic diastolic (congestive) heart failure: Secondary | ICD-10-CM | POA: Diagnosis not present

## 2018-01-05 DIAGNOSIS — S8252XD Displaced fracture of medial malleolus of left tibia, subsequent encounter for closed fracture with routine healing: Secondary | ICD-10-CM | POA: Diagnosis not present

## 2018-01-05 DIAGNOSIS — N189 Chronic kidney disease, unspecified: Secondary | ICD-10-CM | POA: Diagnosis not present

## 2018-01-09 DIAGNOSIS — I482 Chronic atrial fibrillation: Secondary | ICD-10-CM | POA: Diagnosis not present

## 2018-01-09 DIAGNOSIS — N189 Chronic kidney disease, unspecified: Secondary | ICD-10-CM | POA: Diagnosis not present

## 2018-01-09 DIAGNOSIS — S8252XD Displaced fracture of medial malleolus of left tibia, subsequent encounter for closed fracture with routine healing: Secondary | ICD-10-CM | POA: Diagnosis not present

## 2018-01-09 DIAGNOSIS — S82832D Other fracture of upper and lower end of left fibula, subsequent encounter for closed fracture with routine healing: Secondary | ICD-10-CM | POA: Diagnosis not present

## 2018-01-09 DIAGNOSIS — I5033 Acute on chronic diastolic (congestive) heart failure: Secondary | ICD-10-CM | POA: Diagnosis not present

## 2018-01-09 DIAGNOSIS — I130 Hypertensive heart and chronic kidney disease with heart failure and stage 1 through stage 4 chronic kidney disease, or unspecified chronic kidney disease: Secondary | ICD-10-CM | POA: Diagnosis not present

## 2018-01-11 DIAGNOSIS — S82832D Other fracture of upper and lower end of left fibula, subsequent encounter for closed fracture with routine healing: Secondary | ICD-10-CM | POA: Diagnosis not present

## 2018-01-11 DIAGNOSIS — I130 Hypertensive heart and chronic kidney disease with heart failure and stage 1 through stage 4 chronic kidney disease, or unspecified chronic kidney disease: Secondary | ICD-10-CM | POA: Diagnosis not present

## 2018-01-11 DIAGNOSIS — S8252XD Displaced fracture of medial malleolus of left tibia, subsequent encounter for closed fracture with routine healing: Secondary | ICD-10-CM | POA: Diagnosis not present

## 2018-01-11 DIAGNOSIS — I5033 Acute on chronic diastolic (congestive) heart failure: Secondary | ICD-10-CM | POA: Diagnosis not present

## 2018-01-11 DIAGNOSIS — I482 Chronic atrial fibrillation: Secondary | ICD-10-CM | POA: Diagnosis not present

## 2018-01-11 DIAGNOSIS — N189 Chronic kidney disease, unspecified: Secondary | ICD-10-CM | POA: Diagnosis not present

## 2018-01-15 DIAGNOSIS — I5033 Acute on chronic diastolic (congestive) heart failure: Secondary | ICD-10-CM | POA: Diagnosis not present

## 2018-01-15 DIAGNOSIS — S8252XD Displaced fracture of medial malleolus of left tibia, subsequent encounter for closed fracture with routine healing: Secondary | ICD-10-CM | POA: Diagnosis not present

## 2018-01-15 DIAGNOSIS — S82832D Other fracture of upper and lower end of left fibula, subsequent encounter for closed fracture with routine healing: Secondary | ICD-10-CM | POA: Diagnosis not present

## 2018-01-15 DIAGNOSIS — I130 Hypertensive heart and chronic kidney disease with heart failure and stage 1 through stage 4 chronic kidney disease, or unspecified chronic kidney disease: Secondary | ICD-10-CM | POA: Diagnosis not present

## 2018-01-15 DIAGNOSIS — N189 Chronic kidney disease, unspecified: Secondary | ICD-10-CM | POA: Diagnosis not present

## 2018-01-15 DIAGNOSIS — I482 Chronic atrial fibrillation: Secondary | ICD-10-CM | POA: Diagnosis not present

## 2018-01-16 DIAGNOSIS — I130 Hypertensive heart and chronic kidney disease with heart failure and stage 1 through stage 4 chronic kidney disease, or unspecified chronic kidney disease: Secondary | ICD-10-CM | POA: Diagnosis not present

## 2018-01-16 DIAGNOSIS — I482 Chronic atrial fibrillation: Secondary | ICD-10-CM | POA: Diagnosis not present

## 2018-01-16 DIAGNOSIS — S8252XD Displaced fracture of medial malleolus of left tibia, subsequent encounter for closed fracture with routine healing: Secondary | ICD-10-CM | POA: Diagnosis not present

## 2018-01-16 DIAGNOSIS — S82832D Other fracture of upper and lower end of left fibula, subsequent encounter for closed fracture with routine healing: Secondary | ICD-10-CM | POA: Diagnosis not present

## 2018-01-16 DIAGNOSIS — N189 Chronic kidney disease, unspecified: Secondary | ICD-10-CM | POA: Diagnosis not present

## 2018-01-16 DIAGNOSIS — I5033 Acute on chronic diastolic (congestive) heart failure: Secondary | ICD-10-CM | POA: Diagnosis not present

## 2018-01-18 DIAGNOSIS — S82832D Other fracture of upper and lower end of left fibula, subsequent encounter for closed fracture with routine healing: Secondary | ICD-10-CM | POA: Diagnosis not present

## 2018-01-18 DIAGNOSIS — I5033 Acute on chronic diastolic (congestive) heart failure: Secondary | ICD-10-CM | POA: Diagnosis not present

## 2018-01-18 DIAGNOSIS — N189 Chronic kidney disease, unspecified: Secondary | ICD-10-CM | POA: Diagnosis not present

## 2018-01-18 DIAGNOSIS — I130 Hypertensive heart and chronic kidney disease with heart failure and stage 1 through stage 4 chronic kidney disease, or unspecified chronic kidney disease: Secondary | ICD-10-CM | POA: Diagnosis not present

## 2018-01-18 DIAGNOSIS — S8252XD Displaced fracture of medial malleolus of left tibia, subsequent encounter for closed fracture with routine healing: Secondary | ICD-10-CM | POA: Diagnosis not present

## 2018-01-18 DIAGNOSIS — I482 Chronic atrial fibrillation: Secondary | ICD-10-CM | POA: Diagnosis not present

## 2018-01-19 DIAGNOSIS — I482 Chronic atrial fibrillation: Secondary | ICD-10-CM | POA: Diagnosis not present

## 2018-01-19 DIAGNOSIS — S82832D Other fracture of upper and lower end of left fibula, subsequent encounter for closed fracture with routine healing: Secondary | ICD-10-CM | POA: Diagnosis not present

## 2018-01-19 DIAGNOSIS — I5033 Acute on chronic diastolic (congestive) heart failure: Secondary | ICD-10-CM | POA: Diagnosis not present

## 2018-01-19 DIAGNOSIS — S8252XD Displaced fracture of medial malleolus of left tibia, subsequent encounter for closed fracture with routine healing: Secondary | ICD-10-CM | POA: Diagnosis not present

## 2018-01-19 DIAGNOSIS — N189 Chronic kidney disease, unspecified: Secondary | ICD-10-CM | POA: Diagnosis not present

## 2018-01-19 DIAGNOSIS — I130 Hypertensive heart and chronic kidney disease with heart failure and stage 1 through stage 4 chronic kidney disease, or unspecified chronic kidney disease: Secondary | ICD-10-CM | POA: Diagnosis not present

## 2018-01-23 DIAGNOSIS — S8252XD Displaced fracture of medial malleolus of left tibia, subsequent encounter for closed fracture with routine healing: Secondary | ICD-10-CM | POA: Diagnosis not present

## 2018-01-23 DIAGNOSIS — I130 Hypertensive heart and chronic kidney disease with heart failure and stage 1 through stage 4 chronic kidney disease, or unspecified chronic kidney disease: Secondary | ICD-10-CM | POA: Diagnosis not present

## 2018-01-23 DIAGNOSIS — N189 Chronic kidney disease, unspecified: Secondary | ICD-10-CM | POA: Diagnosis not present

## 2018-01-23 DIAGNOSIS — S82832D Other fracture of upper and lower end of left fibula, subsequent encounter for closed fracture with routine healing: Secondary | ICD-10-CM | POA: Diagnosis not present

## 2018-01-23 DIAGNOSIS — I5033 Acute on chronic diastolic (congestive) heart failure: Secondary | ICD-10-CM | POA: Diagnosis not present

## 2018-01-23 DIAGNOSIS — I482 Chronic atrial fibrillation: Secondary | ICD-10-CM | POA: Diagnosis not present

## 2018-01-25 DIAGNOSIS — S8252XD Displaced fracture of medial malleolus of left tibia, subsequent encounter for closed fracture with routine healing: Secondary | ICD-10-CM | POA: Diagnosis not present

## 2018-01-25 DIAGNOSIS — I130 Hypertensive heart and chronic kidney disease with heart failure and stage 1 through stage 4 chronic kidney disease, or unspecified chronic kidney disease: Secondary | ICD-10-CM | POA: Diagnosis not present

## 2018-01-25 DIAGNOSIS — N189 Chronic kidney disease, unspecified: Secondary | ICD-10-CM | POA: Diagnosis not present

## 2018-01-25 DIAGNOSIS — I5033 Acute on chronic diastolic (congestive) heart failure: Secondary | ICD-10-CM | POA: Diagnosis not present

## 2018-01-25 DIAGNOSIS — I482 Chronic atrial fibrillation: Secondary | ICD-10-CM | POA: Diagnosis not present

## 2018-01-25 DIAGNOSIS — S82832D Other fracture of upper and lower end of left fibula, subsequent encounter for closed fracture with routine healing: Secondary | ICD-10-CM | POA: Diagnosis not present

## 2018-01-26 DIAGNOSIS — S8252XD Displaced fracture of medial malleolus of left tibia, subsequent encounter for closed fracture with routine healing: Secondary | ICD-10-CM | POA: Diagnosis not present

## 2018-01-26 DIAGNOSIS — S82832D Other fracture of upper and lower end of left fibula, subsequent encounter for closed fracture with routine healing: Secondary | ICD-10-CM | POA: Diagnosis not present

## 2018-01-26 DIAGNOSIS — I130 Hypertensive heart and chronic kidney disease with heart failure and stage 1 through stage 4 chronic kidney disease, or unspecified chronic kidney disease: Secondary | ICD-10-CM | POA: Diagnosis not present

## 2018-01-26 DIAGNOSIS — I5033 Acute on chronic diastolic (congestive) heart failure: Secondary | ICD-10-CM | POA: Diagnosis not present

## 2018-01-26 DIAGNOSIS — I482 Chronic atrial fibrillation: Secondary | ICD-10-CM | POA: Diagnosis not present

## 2018-01-26 DIAGNOSIS — N189 Chronic kidney disease, unspecified: Secondary | ICD-10-CM | POA: Diagnosis not present

## 2018-01-30 DIAGNOSIS — S8252XD Displaced fracture of medial malleolus of left tibia, subsequent encounter for closed fracture with routine healing: Secondary | ICD-10-CM | POA: Diagnosis not present

## 2018-01-30 DIAGNOSIS — I5033 Acute on chronic diastolic (congestive) heart failure: Secondary | ICD-10-CM | POA: Diagnosis not present

## 2018-01-30 DIAGNOSIS — I482 Chronic atrial fibrillation: Secondary | ICD-10-CM | POA: Diagnosis not present

## 2018-01-30 DIAGNOSIS — N189 Chronic kidney disease, unspecified: Secondary | ICD-10-CM | POA: Diagnosis not present

## 2018-01-30 DIAGNOSIS — I130 Hypertensive heart and chronic kidney disease with heart failure and stage 1 through stage 4 chronic kidney disease, or unspecified chronic kidney disease: Secondary | ICD-10-CM | POA: Diagnosis not present

## 2018-01-30 DIAGNOSIS — S82832D Other fracture of upper and lower end of left fibula, subsequent encounter for closed fracture with routine healing: Secondary | ICD-10-CM | POA: Diagnosis not present

## 2018-01-30 NOTE — Progress Notes (Signed)
Cardiology Office Note    Date:  01/31/2018   ID:  Rebecca Norman, DOB 1925-09-04, MRN [id number]  PCP:  [name], MD  Cardiologist:  Dr. [name]   Chief Complaint  Patient presents with  . Follow-up    SOB noted, no energy     History of Present Illness: Rebecca Norman is a 84 y.o. female who presents for ongoing assessment and management of PAF and diastolic CHF. Hx of of DCCV for atrial fib with runs of PAT. She is now on amiodarone, Eliquis, and metoprolol.  She was last seen on 11/28/2017 and was a resident of SNF for PT.   Patient comes today with complaints of generalized fatigue.  She states she feels as if she has a urinary tract infection.  She denies chest pain or dyspnea.  She did wake up at night last night stating that she felt as if she could not breathe as her oxygen saturation was normal.  The patient is very sedentary but does work with physical therapy twice a week in her home.  She continues to wear oxygen.  Past Medical History:  Diagnosis Date  . DDD (degenerative disc disease), cervical    severe/  auto fusion c2-c5  . Diverticulosis   . Hemorrhoids   . History of adenomatous polyp of colon    1995  adenomatous polyp/  1997 & 2003  hyperplastic polyp's  . History of bladder cancer urologist-  dr [name]   s/p  TURBT's  . History of cancer of ureter   . Hypertension   . Hypothyroid   . Nocturia   . Osteoarthritis   . Osteopenia     Past Surgical History:  Procedure Laterality Date  . BACK SURGERY    . CARDIOVERSION N/A 08/09/2017   Procedure: CARDIOVERSION;  Surgeon: Larey Dresser, MD;  Location: [facility] ENDOSCOPY;  Service: Cardiovascular;  Laterality: N/A;  . CATARACT EXTRACTION W/ INTRAOCULAR LENS  IMPLANT, BILATERAL Bilateral   . CYSTOSCOPY W/ RETROGRADES  10/03/2011   Procedure: CYSTOSCOPY WITH RETROGRADE PYELOGRAM;  Surgeon: Molli Hazard, MD;  Location: [facility];  Service: Urology;;  . Consuela Mimes W/ RETROGRADES  Bilateral 06/17/2016   Procedure: CYSTOSCOPY WITH RETROGRADE PYELOGRAM;  Surgeon: Alexis Frock, MD;  Location: [facility];  Service: Urology;  Laterality: Bilateral;  . CYSTOSCOPY WITH BIOPSY N/A 06/17/2016   Procedure: CYSTOSCOPY WITH BIOPSY AND FULGERATION;  Surgeon: Alexis Frock, MD;  Location: [facility];  Service: Urology;  Laterality: N/A;  . LAMINECTOMY  2009   L3 - 4  . ORIF ANKLE FRACTURE Left 08/28/2017   Procedure: OPEN REDUCTION INTERNAL FIXATION (ORIF) ANKLE FRACTURE;  Surgeon: Shona Needles, MD;  Location: [facility];  Service: Orthopedics;  Laterality: Left;  . TRANSURETHRAL RESECTION OF BLADDER  x3 --  2009; 2010; 06-29-2010  . TRANSURETHRAL RESECTION OF BLADDER TUMOR  10/03/2011   Procedure: TRANSURETHRAL RESECTION OF BLADDER TUMOR (TURBT);  Surgeon: Molli Hazard, MD;  Location: [facility];  Service: Urology;  Laterality: N/A;     Current Outpatient Medications  Medication Sig Dispense Refill  . acetaminophen (TYLENOL) 500 MG tablet Take 500 mg by mouth every 6 (six) hours as needed for headache (pain).     Marland Kitchen amiodarone (PACERONE) 200 MG tablet Take 1 tablet (200 mg total) by mouth daily. 30 tablet 8  . amLODipine (NORVASC) 2.5 MG tablet Take 2.5 mg by mouth daily.    Marland Kitchen apixaban (ELIQUIS) 5 MG TABS  tablet Take 1 tablet (5 mg total) by mouth 2 (two) times daily. 180 tablet 1  . furosemide (LASIX) 40 MG tablet Take 40 mg by mouth daily.    Marland Kitchen levothyroxine (SYNTHROID, LEVOTHROID) 50 MCG tablet Take 50 mcg by mouth daily before breakfast.     . metoprolol tartrate (LOPRESSOR) 25 MG tablet Take 25 mg by mouth 2 (two) times daily.    . polyethylene glycol (MIRALAX / GLYCOLAX) packet Take 17 g by mouth daily as needed for mild constipation.     No current facility-administered medications for this visit.     Allergies:   Flagyl [metronidazole] and Lisinopril    Social History:  The patient  reports that she quit  smoking about 12 years ago. Her smoking use included cigarettes. She quit after 10.00 years of use. She has never used smokeless tobacco. She reports that she drinks alcohol. She reports that she does not use drugs.   Family History:  The patient's family history includes Breast cancer in her sister; Hernia (age of onset: 16) in her sister; Stomach cancer in her sister.    ROS: All other systems are reviewed and negative. Unless otherwise mentioned in H&P    PHYSICAL EXAM: VS:  BP 122/60 (BP Location: Left Arm)   Pulse (!) 54   Ht 5\' 2"  (1.575 m)   Wt 167 lb 9.6 oz (76 kg)   BMI 30.65 kg/m  , BMI Body mass index is 30.65 kg/m. GEN: Well nourished, well developed, in no acute distress Sitting in a wheelchair.  HEENT: normal  Neck: no JVD, carotid bruits, or masses Cardiac: RRR; no murmurs, rubs, or gallops,no edema  Respiratory:  clear to auscultation bilaterally, normal work of breathing. Wearing O2 via [place] GI: soft, nontender, nondistended, + BS MS: no deformity or atrophy mild dependent edema.  Skin: warm and dry, no rash Neuro:  Strength and sensation are intact Psych: euthymic mood, full affect   EKG: Not completed during this office visit.    Recent Labs: 08/06/2017: TSH 2.929 11/12/2017: B Natriuretic Peptide 871.5 11/13/2017: ALT 12 11/14/2017: Magnesium 2.0 11/28/2017: BUN 19; Creatinine, Ser 0.96; Hemoglobin 11.5; Platelets 265; Potassium 4.8; Sodium 139    Lipid Panel No results found for: CHOL, TRIG, HDL, CHOLHDL, VLDL, LDLCALC, LDLDIRECT    Wt Readings from Last 3 Encounters:  01/31/18 167 lb 9.6 oz (76 kg)  12/04/17 170 lb 4.8 oz (77.2 kg)  11/28/17 169 lb (76.7 kg)      Other studies Reviewed:  Echocardiogram 11/14/2017  Left ventricle: The cavity size was normal. There was severe   concentric hypertrophy. Systolic function was vigorous. The   estimated ejection fraction was in the range of 65% to 70%. Wall   motion was normal; there were no regional wall  motion   abnormalities. Doppler parameters are consistent with high   ventricular filling pressure. - Aortic valve: There was very mild stenosis. There was trivial   regurgitation. Peak velocity (S): 215 cm/s. Mean gradient (S): 11   mm Hg. Valve area (Vmax): 1.94 cm^2. - Mitral valve: Severely calcified annulus. Mildly thickened   leaflets . There was mild regurgitation. - Left atrium: The atrium was moderately dilated.   ASSESSMENT AND PLAN:  1.  Generalized fatigue: Patient states she feels the same way she has felt in the past when she had a urinary tract infection.  I have offered to get a urine sample to evaluate for this but she refuses at this time.  She  is due to see Dr. Brigitte Pulse the day after tomorrow and will discuss this with her.  The patient is eating and drinking and taking her medications as directed.  She states this is just been occurring over the last couple of days.  She denies chest pain, PND, or dizziness.  I have reviewed labs from Dr. Raul Del office completed on 12/21/2017, all within normal limits.  Blood glucose was normal.  There is no evidence of anemia.  2.  Chronic diastolic heart failure: The patient has not been having any significant weight gain or swelling.  She states that her legs are more swollen today but she has not taken her Lasix yet due to office appointment.  The patient states that she is cutting back on salt.  Her weight is actually down 2 pounds.  I did tell her that she can come back on her Lasix to 20 mg for the next couple of days to see if this helps perk her up, but if she begins to gain weight or have more fluid retention she will need to go back to her normal dose of 40 mg daily.  3.  Paroxysmal atrial fibrillation: The patient remains in regular rhythm by auscultation.  She is on Eliquis.  Review of labs did not review any evidence of anemia.  4.  Hypertension: Low normal.  She may need to have some permissive hypertension due to her generalized  fatigue.  It is very hot outside and she has been pending some time outside and therefore this may be contributing.  She will follow her blood pressures at home.  She is due to see her primary care physician in the day and a half if blood pressure remains low, can consider cutting back on Lasix permanently to 20 mg daily.  Edema in her lower extremities may be related to amlodipine.  5.  Self-reported possible UTI: I have offered to have a urinalysis completed that the patient is refused that she will be seeing her PCP in a day and a half and prefers to give her a urine sample at that time.    Current medicines are reviewed at length with the patient today.    Labs/ tests ordered today include: None   Phill Myron. West Pugh, ANP, AACC   01/31/2018 1:15 PM     [facility] [address], [place], [place] [address] Phone: [phone]; Fax: [phone]

## 2018-01-31 ENCOUNTER — Encounter: Payer: Self-pay | Admitting: Adult Health

## 2018-01-31 ENCOUNTER — Ambulatory Visit (INDEPENDENT_AMBULATORY_CARE_PROVIDER_SITE_OTHER): Payer: Medicare Other | Admitting: Adult Health

## 2018-01-31 VITALS — BP 122/60 | HR 54 | Ht 62.0 in | Wt 167.6 lb

## 2018-01-31 DIAGNOSIS — I10 Essential (primary) hypertension: Secondary | ICD-10-CM

## 2018-01-31 DIAGNOSIS — I482 Chronic atrial fibrillation: Secondary | ICD-10-CM | POA: Diagnosis not present

## 2018-01-31 DIAGNOSIS — S82832D Other fracture of upper and lower end of left fibula, subsequent encounter for closed fracture with routine healing: Secondary | ICD-10-CM | POA: Diagnosis not present

## 2018-01-31 DIAGNOSIS — S8252XD Displaced fracture of medial malleolus of left tibia, subsequent encounter for closed fracture with routine healing: Secondary | ICD-10-CM | POA: Diagnosis not present

## 2018-01-31 DIAGNOSIS — I5033 Acute on chronic diastolic (congestive) heart failure: Secondary | ICD-10-CM | POA: Diagnosis not present

## 2018-01-31 DIAGNOSIS — R5383 Other fatigue: Secondary | ICD-10-CM

## 2018-01-31 DIAGNOSIS — I5032 Chronic diastolic (congestive) heart failure: Secondary | ICD-10-CM

## 2018-01-31 DIAGNOSIS — N189 Chronic kidney disease, unspecified: Secondary | ICD-10-CM | POA: Diagnosis not present

## 2018-01-31 DIAGNOSIS — I130 Hypertensive heart and chronic kidney disease with heart failure and stage 1 through stage 4 chronic kidney disease, or unspecified chronic kidney disease: Secondary | ICD-10-CM | POA: Diagnosis not present

## 2018-01-31 NOTE — Patient Instructions (Signed)
Medication Instructions:  NO CHANGES- Your physician recommends that you continue on your current medications as directed. Please refer to the Current Medication list given to you today.  If you need a refill on your cardiac medications before your next appointment, please call your pharmacy.  Special Instructions: Make sure to follow up with dr Brigitte Pulse  Follow-Up: Your physician wants you to follow-up in: 4-6 [place].   Thank you for choosing [facility] at [facility]!!

## 2018-02-01 DIAGNOSIS — S8252XD Displaced fracture of medial malleolus of left tibia, subsequent encounter for closed fracture with routine healing: Secondary | ICD-10-CM | POA: Diagnosis not present

## 2018-02-01 DIAGNOSIS — N189 Chronic kidney disease, unspecified: Secondary | ICD-10-CM | POA: Diagnosis not present

## 2018-02-01 DIAGNOSIS — I130 Hypertensive heart and chronic kidney disease with heart failure and stage 1 through stage 4 chronic kidney disease, or unspecified chronic kidney disease: Secondary | ICD-10-CM | POA: Diagnosis not present

## 2018-02-01 DIAGNOSIS — I482 Chronic atrial fibrillation: Secondary | ICD-10-CM | POA: Diagnosis not present

## 2018-02-01 DIAGNOSIS — I5033 Acute on chronic diastolic (congestive) heart failure: Secondary | ICD-10-CM | POA: Diagnosis not present

## 2018-02-01 DIAGNOSIS — S82832D Other fracture of upper and lower end of left fibula, subsequent encounter for closed fracture with routine healing: Secondary | ICD-10-CM | POA: Diagnosis not present

## 2018-02-02 DIAGNOSIS — I5030 Unspecified diastolic (congestive) heart failure: Secondary | ICD-10-CM | POA: Diagnosis not present

## 2018-02-02 DIAGNOSIS — R300 Dysuria: Secondary | ICD-10-CM | POA: Diagnosis not present

## 2018-02-02 DIAGNOSIS — R809 Proteinuria, unspecified: Secondary | ICD-10-CM | POA: Diagnosis not present

## 2018-02-02 DIAGNOSIS — D649 Anemia, unspecified: Secondary | ICD-10-CM | POA: Diagnosis not present

## 2018-02-02 DIAGNOSIS — M150 Primary generalized (osteo)arthritis: Secondary | ICD-10-CM | POA: Diagnosis not present

## 2018-02-02 DIAGNOSIS — C679 Malignant neoplasm of bladder, unspecified: Secondary | ICD-10-CM | POA: Diagnosis not present

## 2018-02-02 DIAGNOSIS — I4891 Unspecified atrial fibrillation: Secondary | ICD-10-CM | POA: Diagnosis not present

## 2018-02-02 DIAGNOSIS — I350 Nonrheumatic aortic (valve) stenosis: Secondary | ICD-10-CM | POA: Diagnosis not present

## 2018-02-02 DIAGNOSIS — R319 Hematuria, unspecified: Secondary | ICD-10-CM | POA: Diagnosis not present

## 2018-02-02 DIAGNOSIS — E039 Hypothyroidism, unspecified: Secondary | ICD-10-CM | POA: Diagnosis not present

## 2018-02-02 DIAGNOSIS — Z0000 Encounter for general adult medical examination without abnormal findings: Secondary | ICD-10-CM | POA: Diagnosis not present

## 2018-02-02 DIAGNOSIS — I110 Hypertensive heart disease with heart failure: Secondary | ICD-10-CM | POA: Diagnosis not present

## 2018-02-05 IMAGING — DX DG CHEST 2V
2 series · 2 of 2 positions shown · non-contrast
Comparison: Chest x-ray dated June 29, 2010.

CLINICAL DATA: Shortness breath and cough.

EXAM:
CHEST  2 VIEW

[chest pa]
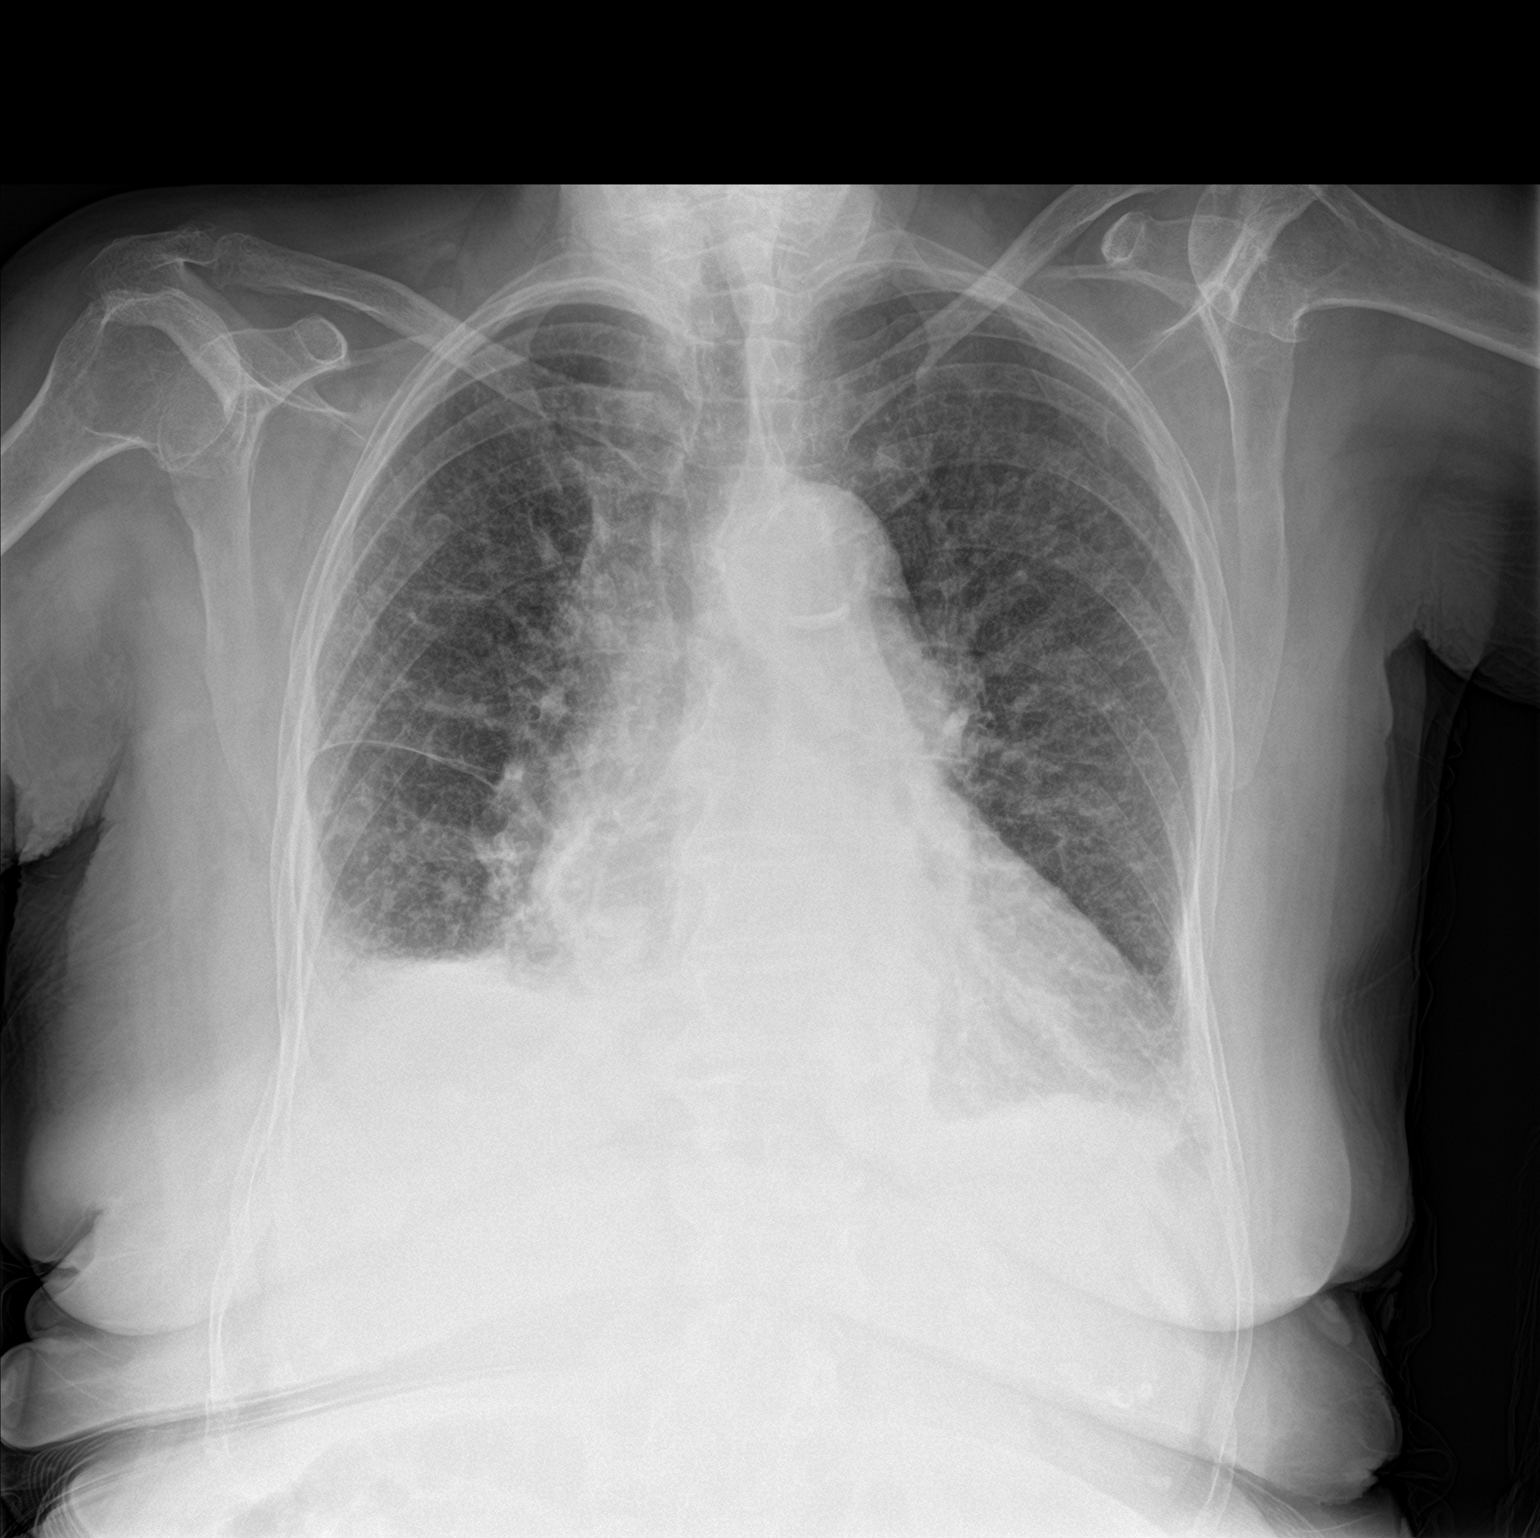

[chest lat]
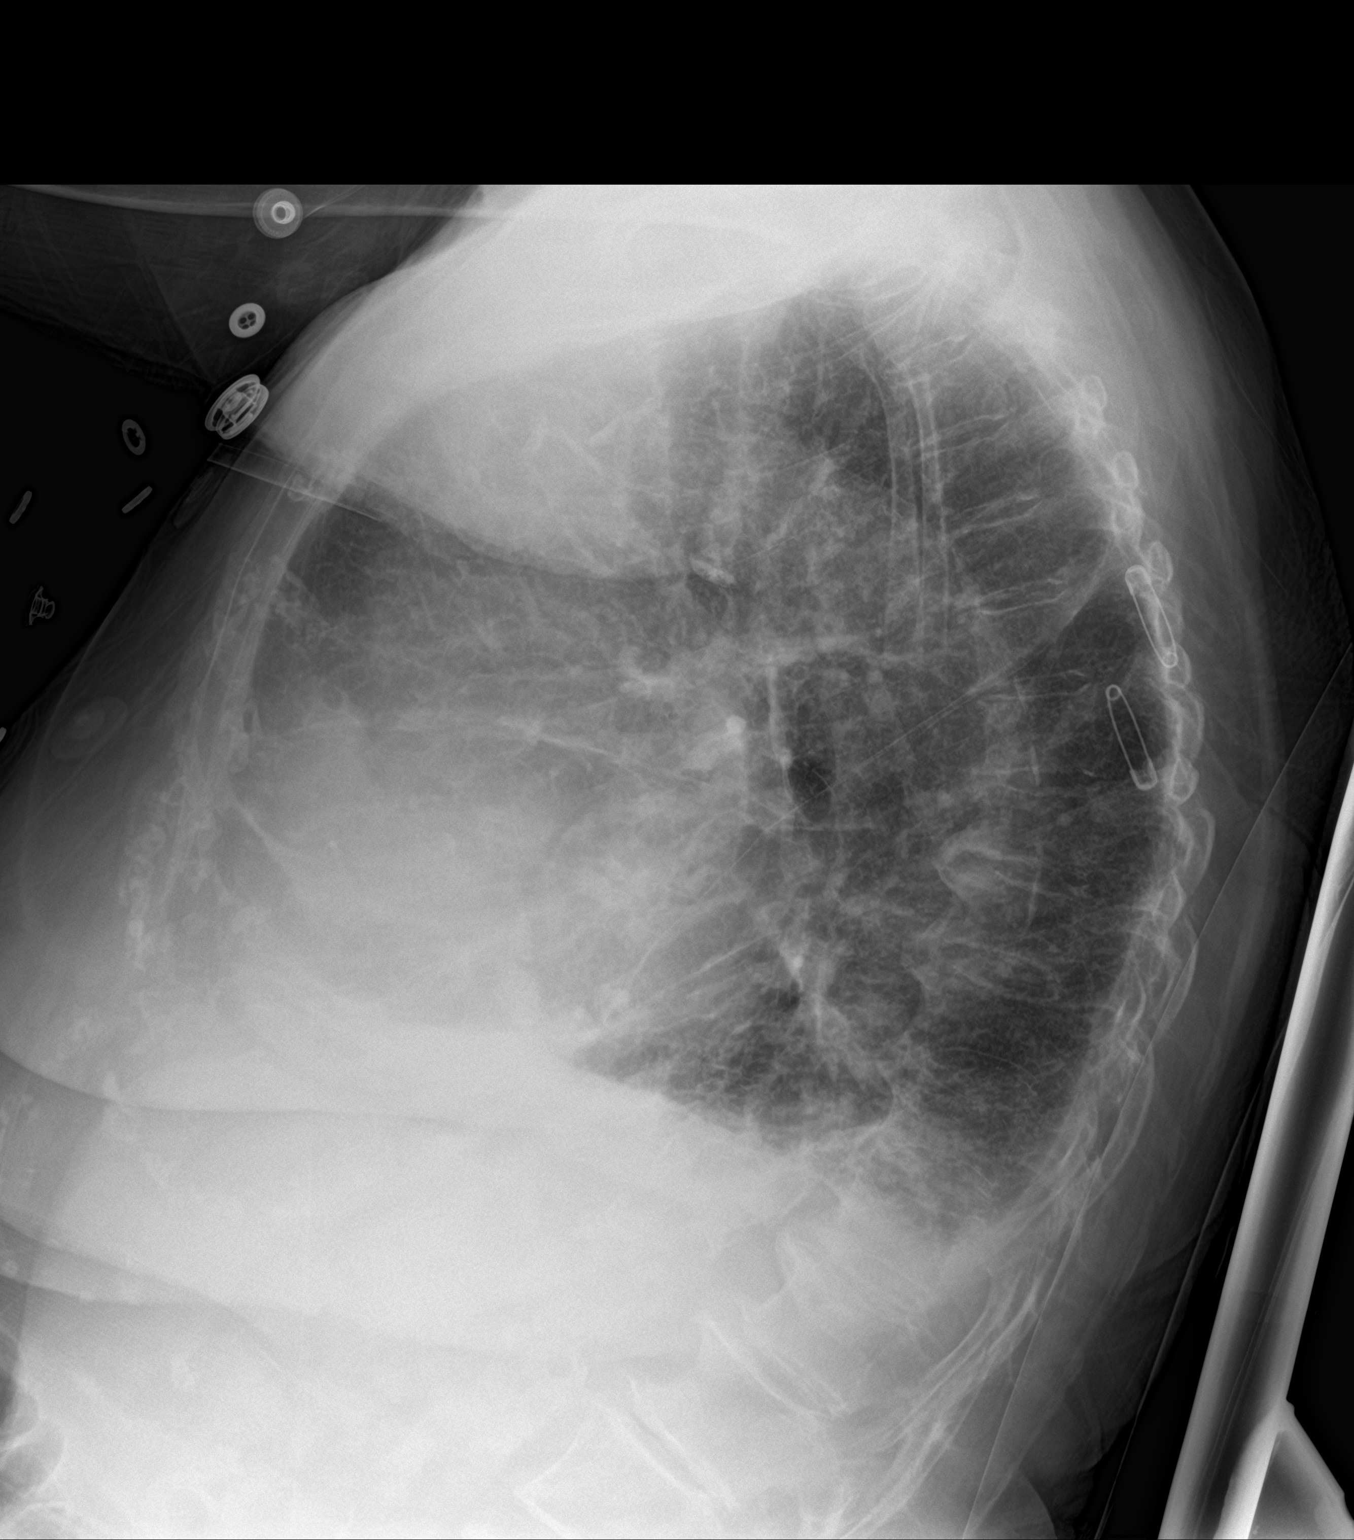

[2 of 2 positions shown; findings below may reference images not displayed]

FINDINGS: Moderate cardiomegaly, similar to prior study. Diffusely increased
interstitial markings. Atherosclerotic calcification of the aortic
arch. Small right pleural effusion. Bibasilar atelectasis. No
pneumothorax. No acute osseous abnormality.
IMPRESSION: Moderate cardiomegaly with pulmonary interstitial edema and small
right pleural effusion.

## 2018-02-06 ENCOUNTER — Telehealth: Payer: Self-pay | Admitting: Adult Health

## 2018-02-06 NOTE — Telephone Encounter (Signed)
New Message:       Pt c/o medication issue:  1. Name of Medication: apixaban (ELIQUIS) 5 MG TABS tablet  2. How are you currently taking this medication (dosage and times per day)? Take 1 tablet (5 mg total) by mouth 2 (two) times daily.  3. Are you having a reaction (difficulty breathing--STAT)? No  4. What is your medication issue? Pt is stating that she is needing assistance with cost of this medication and gave [place] a number that they provided to her which is [phone].

## 2018-02-06 NOTE — Telephone Encounter (Signed)
Spoke with pt who state she is needing assistance with paying for her Eliquis. Informed pt that we could mail a patient assistance form for her to fill. Pt was agreeable. Letter mailed.

## 2018-02-07 ENCOUNTER — Inpatient Hospital Stay (HOSPITAL_COMMUNITY)
Admission: EM | Admit: 2018-02-07 | Discharge: 2018-02-10 | DRG: 292 | Disposition: A | Discharge status: Skilled Nursing Facility | Payer: Medicare Other | Attending: Internal Medicine | Admitting: Internal Medicine

## 2018-02-07 ENCOUNTER — Emergency Department (HOSPITAL_COMMUNITY): Payer: Medicare Other

## 2018-02-07 ENCOUNTER — Encounter (HOSPITAL_COMMUNITY): Payer: Self-pay

## 2018-02-07 DIAGNOSIS — I480 Paroxysmal atrial fibrillation: Secondary | ICD-10-CM | POA: Diagnosis present

## 2018-02-07 DIAGNOSIS — S82832D Other fracture of upper and lower end of left fibula, subsequent encounter for closed fracture with routine healing: Secondary | ICD-10-CM | POA: Diagnosis not present

## 2018-02-07 DIAGNOSIS — M5031 Other cervical disc degeneration,  high cervical region: Secondary | ICD-10-CM | POA: Diagnosis not present

## 2018-02-07 DIAGNOSIS — I1310 Hypertensive heart and chronic kidney disease without heart failure, with stage 1 through stage 4 chronic kidney disease, or unspecified chronic kidney disease: Secondary | ICD-10-CM | POA: Diagnosis not present

## 2018-02-07 DIAGNOSIS — Z8551 Personal history of malignant neoplasm of bladder: Secondary | ICD-10-CM

## 2018-02-07 DIAGNOSIS — R0902 Hypoxemia: Secondary | ICD-10-CM | POA: Diagnosis not present

## 2018-02-07 DIAGNOSIS — S8252XD Displaced fracture of medial malleolus of left tibia, subsequent encounter for closed fracture with routine healing: Secondary | ICD-10-CM | POA: Diagnosis not present

## 2018-02-07 DIAGNOSIS — M8580 Other specified disorders of bone density and structure, unspecified site: Secondary | ICD-10-CM | POA: Diagnosis present

## 2018-02-07 DIAGNOSIS — D638 Anemia in other chronic diseases classified elsewhere: Secondary | ICD-10-CM | POA: Diagnosis present

## 2018-02-07 DIAGNOSIS — Z79899 Other long term (current) drug therapy: Secondary | ICD-10-CM

## 2018-02-07 DIAGNOSIS — N189 Chronic kidney disease, unspecified: Secondary | ICD-10-CM | POA: Diagnosis not present

## 2018-02-07 DIAGNOSIS — Z881 Allergy status to other antibiotic agents status: Secondary | ICD-10-CM

## 2018-02-07 DIAGNOSIS — Z66 Do not resuscitate: Secondary | ICD-10-CM | POA: Diagnosis not present

## 2018-02-07 DIAGNOSIS — E878 Other disorders of electrolyte and fluid balance, not elsewhere classified: Secondary | ICD-10-CM | POA: Diagnosis present

## 2018-02-07 DIAGNOSIS — Z86010 Personal history of colonic polyps: Secondary | ICD-10-CM | POA: Diagnosis not present

## 2018-02-07 DIAGNOSIS — I10 Essential (primary) hypertension: Secondary | ICD-10-CM | POA: Diagnosis present

## 2018-02-07 DIAGNOSIS — J9611 Chronic respiratory failure with hypoxia: Secondary | ICD-10-CM | POA: Diagnosis present

## 2018-02-07 DIAGNOSIS — N19 Unspecified kidney failure: Secondary | ICD-10-CM

## 2018-02-07 DIAGNOSIS — E039 Hypothyroidism, unspecified: Secondary | ICD-10-CM | POA: Diagnosis present

## 2018-02-07 DIAGNOSIS — Z7901 Long term (current) use of anticoagulants: Secondary | ICD-10-CM | POA: Diagnosis not present

## 2018-02-07 DIAGNOSIS — R0602 Shortness of breath: Secondary | ICD-10-CM | POA: Diagnosis not present

## 2018-02-07 DIAGNOSIS — I482 Chronic atrial fibrillation, unspecified: Secondary | ICD-10-CM | POA: Diagnosis present

## 2018-02-07 DIAGNOSIS — Z8554 Personal history of malignant neoplasm of ureter: Secondary | ICD-10-CM | POA: Diagnosis not present

## 2018-02-07 DIAGNOSIS — Z9981 Dependence on supplemental oxygen: Secondary | ICD-10-CM

## 2018-02-07 DIAGNOSIS — M2550 Pain in unspecified joint: Secondary | ICD-10-CM | POA: Diagnosis not present

## 2018-02-07 DIAGNOSIS — I130 Hypertensive heart and chronic kidney disease with heart failure and stage 1 through stage 4 chronic kidney disease, or unspecified chronic kidney disease: Secondary | ICD-10-CM | POA: Diagnosis not present

## 2018-02-07 DIAGNOSIS — M5030 Other cervical disc degeneration, unspecified cervical region: Secondary | ICD-10-CM | POA: Diagnosis present

## 2018-02-07 DIAGNOSIS — N390 Urinary tract infection, site not specified: Secondary | ICD-10-CM | POA: Diagnosis present

## 2018-02-07 DIAGNOSIS — E034 Atrophy of thyroid (acquired): Secondary | ICD-10-CM | POA: Diagnosis not present

## 2018-02-07 DIAGNOSIS — I110 Hypertensive heart disease with heart failure: Secondary | ICD-10-CM | POA: Diagnosis not present

## 2018-02-07 DIAGNOSIS — R531 Weakness: Secondary | ICD-10-CM | POA: Diagnosis not present

## 2018-02-07 DIAGNOSIS — Z87891 Personal history of nicotine dependence: Secondary | ICD-10-CM

## 2018-02-07 DIAGNOSIS — R2689 Other abnormalities of gait and mobility: Secondary | ICD-10-CM | POA: Diagnosis not present

## 2018-02-07 DIAGNOSIS — I5033 Acute on chronic diastolic (congestive) heart failure: Secondary | ICD-10-CM | POA: Diagnosis present

## 2018-02-07 DIAGNOSIS — R41841 Cognitive communication deficit: Secondary | ICD-10-CM | POA: Diagnosis not present

## 2018-02-07 DIAGNOSIS — D649 Anemia, unspecified: Secondary | ICD-10-CM | POA: Diagnosis not present

## 2018-02-07 DIAGNOSIS — N179 Acute kidney failure, unspecified: Secondary | ICD-10-CM | POA: Diagnosis present

## 2018-02-07 DIAGNOSIS — M6281 Muscle weakness (generalized): Secondary | ICD-10-CM | POA: Diagnosis not present

## 2018-02-07 DIAGNOSIS — M1990 Unspecified osteoarthritis, unspecified site: Secondary | ICD-10-CM | POA: Diagnosis present

## 2018-02-07 DIAGNOSIS — Z7401 Bed confinement status: Secondary | ICD-10-CM | POA: Diagnosis not present

## 2018-02-07 DIAGNOSIS — I491 Atrial premature depolarization: Secondary | ICD-10-CM | POA: Diagnosis not present

## 2018-02-07 DIAGNOSIS — E538 Deficiency of other specified B group vitamins: Secondary | ICD-10-CM | POA: Diagnosis present

## 2018-02-07 DIAGNOSIS — I129 Hypertensive chronic kidney disease with stage 1 through stage 4 chronic kidney disease, or unspecified chronic kidney disease: Secondary | ICD-10-CM | POA: Diagnosis not present

## 2018-02-07 DIAGNOSIS — Z888 Allergy status to other drugs, medicaments and biological substances status: Secondary | ICD-10-CM

## 2018-02-07 LAB — CBC WITH DIFFERENTIAL/PLATELET
Abs Immature Granulocytes: 0.1 10*3/uL (ref 0.0–0.1)
Basophils Absolute: 0 10*3/uL (ref 0.0–0.1)
Basophils Relative: 0 %
Eosinophils Absolute: 0.1 10*3/uL (ref 0.0–0.7)
Eosinophils Relative: 1 %
HEMATOCRIT: 34.8 % — AB (ref 36.0–46.0)
HEMOGLOBIN: 10.7 g/dL — AB (ref 12.0–15.0)
Immature Granulocytes: 0 %
LYMPHS ABS: 0.4 10*3/uL — AB (ref 0.7–4.0)
LYMPHS PCT: 3 %
MCH: 28.5 pg (ref 26.0–34.0)
MCHC: 30.7 g/dL (ref 30.0–36.0)
MCV: 92.8 fL (ref 78.0–100.0)
Monocytes Absolute: 0.8 10*3/uL (ref 0.1–1.0)
Monocytes Relative: 6 %
NEUTROS ABS: 12.4 10*3/uL — AB (ref 1.7–7.7)
Neutrophils Relative %: 90 %
Platelets: 227 10*3/uL (ref 150–400)
RBC: 3.75 MIL/uL — AB (ref 3.87–5.11)
RDW: 14.6 % (ref 11.5–15.5)
WBC: 13.8 10*3/uL — AB (ref 4.0–10.5)

## 2018-02-07 LAB — BASIC METABOLIC PANEL
Anion gap: 10 (ref 5–15)
BUN: 17 mg/dL (ref 6–20)
CALCIUM: 8.7 mg/dL — AB (ref 8.9–10.3)
CHLORIDE: 96 mmol/L — AB (ref 101–111)
CO2: 32 mmol/L (ref 22–32)
Creatinine, Ser: 1.15 mg/dL — ABNORMAL HIGH (ref 0.44–1.00)
GFR calc non Af Amer: 41 mL/min — ABNORMAL LOW (ref >60)
GFR, EST AFRICAN AMERICAN: 47 mL/min — AB (ref >60)
Glucose, Bld: 100 mg/dL — ABNORMAL HIGH (ref 65–99)
Potassium: 3.7 mmol/L (ref 3.5–5.1)
SODIUM: 138 mmol/L (ref 135–145)

## 2018-02-07 LAB — URINALYSIS, ROUTINE W REFLEX MICROSCOPIC
Bilirubin Urine: NEGATIVE
Glucose, UA: NEGATIVE mg/dL
Ketones, ur: NEGATIVE mg/dL
Nitrite: NEGATIVE
PH: 8 (ref 5.0–8.0)
Protein, ur: NEGATIVE mg/dL
Specific Gravity, Urine: 1.005 (ref 1.005–1.030)

## 2018-02-07 LAB — BRAIN NATRIURETIC PEPTIDE: B NATRIURETIC PEPTIDE 5: 1134.6 pg/mL — AB (ref 0.0–100.0)

## 2018-02-07 LAB — I-STAT TROPONIN, ED: TROPONIN I, POC: 0.01 ng/mL (ref 0.00–0.08)

## 2018-02-07 LAB — I-STAT CHEM 8, ED
BUN: 19 mg/dL (ref 6–20)
CALCIUM ION: 1.06 mmol/L — AB (ref 1.15–1.40)
CHLORIDE: 95 mmol/L — AB (ref 101–111)
Creatinine, Ser: 1.1 mg/dL — ABNORMAL HIGH (ref 0.44–1.00)
Glucose, Bld: 98 mg/dL (ref 65–99)
HCT: 34 % — ABNORMAL LOW (ref 36.0–46.0)
HEMOGLOBIN: 11.6 g/dL — AB (ref 12.0–15.0)
POTASSIUM: 3.7 mmol/L (ref 3.5–5.1)
Sodium: 138 mmol/L (ref 135–145)
TCO2: 27 mmol/L (ref 22–32)

## 2018-02-07 LAB — CREATININE, URINE, RANDOM: Creatinine, Urine: <10 mg/dL

## 2018-02-07 LAB — SODIUM, URINE, RANDOM: Sodium, Ur: 128 mmol/L

## 2018-02-07 LAB — TROPONIN I: Troponin I: <0.03 ng/mL (ref <0.03)

## 2018-02-07 LAB — MAGNESIUM: Magnesium: 1.9 mg/dL (ref 1.7–2.4)

## 2018-02-07 MED ORDER — SODIUM CHLORIDE 0.9% FLUSH: 3.0000 mL | INTRAVENOUS | Status: DC | PRN

## 2018-02-07 MED ORDER — FUROSEMIDE 10 MG/ML IJ SOLN: 40.0000 mg | Freq: Every day | INTRAMUSCULAR | Status: DC

## 2018-02-07 MED ORDER — METOPROLOL TARTRATE 25 MG PO TABS
25.0000 mg | ORAL_TABLET | Freq: Two times a day (BID) | ORAL | Status: DC
  Administered 2018-02-08: 25 mg via ORAL
  Filled 2018-02-07: qty 1

## 2018-02-07 MED ORDER — POTASSIUM CHLORIDE CRYS ER 20 MEQ PO TBCR
40.0000 meq | EXTENDED_RELEASE_TABLET | Freq: Once | ORAL | Status: AC
  Administered 2018-02-07: 40 meq via ORAL
  Filled 2018-02-07: qty 2

## 2018-02-07 MED ORDER — ACETAMINOPHEN 650 MG RE SUPP: 650.0000 mg | Freq: Four times a day (QID) | RECTAL | Status: DC | PRN

## 2018-02-07 MED ORDER — APIXABAN 5 MG PO TABS
5.0000 mg | ORAL_TABLET | Freq: Two times a day (BID) | ORAL | Status: DC
  Administered 2018-02-08 – 2018-02-10 (×6): 5 mg via ORAL
  Filled 2018-02-07 (×6): qty 1

## 2018-02-07 MED ORDER — SODIUM CHLORIDE 0.9 % IV SOLN
1.0000 g | INTRAVENOUS | Status: DC
  Administered 2018-02-08 – 2018-02-09 (×3): 1 g via INTRAVENOUS
  Filled 2018-02-07 (×3): qty 10

## 2018-02-07 MED ORDER — ONDANSETRON HCL 4 MG/2ML IJ SOLN: 4.0000 mg | Freq: Four times a day (QID) | INTRAMUSCULAR | Status: DC | PRN

## 2018-02-07 MED ORDER — AMIODARONE HCL 200 MG PO TABS
200.0000 mg | ORAL_TABLET | Freq: Every day | ORAL | Status: DC
  Administered 2018-02-08 – 2018-02-10 (×3): 200 mg via ORAL
  Filled 2018-02-07 (×3): qty 1

## 2018-02-07 MED ORDER — ACETAMINOPHEN 325 MG PO TABS
650.0000 mg | ORAL_TABLET | Freq: Four times a day (QID) | ORAL | Status: DC | PRN
  Administered 2018-02-08: 650 mg via ORAL
  Filled 2018-02-07: qty 2

## 2018-02-07 MED ORDER — ONDANSETRON HCL 4 MG PO TABS: 4.0000 mg | ORAL_TABLET | Freq: Four times a day (QID) | ORAL | Status: DC | PRN

## 2018-02-07 MED ORDER — MAGNESIUM OXIDE 400 (241.3 MG) MG PO TABS
800.0000 mg | ORAL_TABLET | Freq: Once | ORAL | Status: AC
  Administered 2018-02-07: 800 mg via ORAL
  Filled 2018-02-07: qty 2

## 2018-02-07 MED ORDER — LEVOTHYROXINE SODIUM 50 MCG PO TABS: 50.0000 ug | ORAL_TABLET | ORAL | Status: DC

## 2018-02-07 MED ORDER — FUROSEMIDE 10 MG/ML IJ SOLN
40.0000 mg | Freq: Once | INTRAMUSCULAR | Status: AC
  Administered 2018-02-07: 40 mg via INTRAVENOUS
  Filled 2018-02-07: qty 4

## 2018-02-07 MED ORDER — HYDROCODONE-ACETAMINOPHEN 5-325 MG PO TABS: 1.0000 | ORAL_TABLET | ORAL | Status: DC | PRN

## 2018-02-07 MED ORDER — SODIUM CHLORIDE 0.9% FLUSH
3.0000 mL | Freq: Two times a day (BID) | INTRAVENOUS | Status: DC
  Administered 2018-02-08 – 2018-02-10 (×5): 3 mL via INTRAVENOUS

## 2018-02-07 MED ORDER — SODIUM CHLORIDE 0.9 % IV SOLN: 250.0000 mL | INTRAVENOUS | Status: DC | PRN

## 2018-02-07 NOTE — ED Triage Notes (Signed)
Pt arrived via [facility]; per EMS, pt from home with c/o increased SOB with excertion worsening over last 5 days; pt recently dx with UTI and started ABT (name unknown), only completed two doses. No line access, 136/70, 58, 16, 90% with 3L via [place]

## 2018-02-07 NOTE — H&P (Signed)
Rebecca Norman OIN:[id number] DOB: 1925/04/28 DOA: 02/07/2018     PCP: [name], MD   Outpatient Specialists:   CARDS:   Dr. Bayard Hugger Urologist Dr. [name] Patient arrived to ER on 02/07/18 at 1516  Patient coming from:  Lives at home alone  Chief Complaint:  Chief Complaint  Patient presents with  . Shortness of Breath    HPI: Rebecca Norman is a 84 y.o. female with medical history significant of PAF and diastolic CHF, DDD, history of bladder cancer    Presented with worsening shortness of breath with exertion over the past 5 days recently has been diagnosed with UTI has taken 2 doses of antibiotics On presentation was found to be satting 90% on 3 L. Her legs has been swelling for the past few weeks she has not had any cough congestion fevers or chills no chest pain she has been having worsening shortness of breath with laying down flat and had to use 1 more extra pill at home so she can breathe On 3 L at baseline, she currently gets physical therapy twice a week. Patient been seen by cardiology and and of May at that time she was fatigued and her weight was down 2 pounds she was told to decrease her Lasix to 20 mg for few days and then she was supposed to pick it back up to 40 Na[place]er blood pressure remains low.  Cardiology felt that lower extremity edema likely secondary to Norvasc  States burning with urination only got a bit better since she has been on Macrobid since yesterday. No fever or chills.   Regarding pertinent Chronic problems: History of atrial fibrillation status to post DCCV currently on amiodarone Eliquis and metoprolol Diastolic CHF on Lasix 40 mg a day Last echogram in March 2019 showing preserved EF high ventricular filling pressures Story of hypothyroidism on Synthroid 50 mics a day While in ER: CXR worsening pulmonary edemas Given IV lasix And K was replaced States currently feels better Following Medications were ordered in  ER: Medications  potassium chloride SA (K-DUR,KLOR-CON) CR tablet 40 mEq (40 mEq Oral Given 02/07/18 1800)  magnesium oxide (MAG-OX) tablet 800 mg (800 mg Oral Given 02/07/18 1800)  furosemide (LASIX) injection 40 mg (40 mg Intravenous Given 02/07/18 1801)    Significant initial  Findings: Abnormal Labs Reviewed  CBC WITH DIFFERENTIAL/PLATELET - Abnormal; Notable for the following components:      Result Value   WBC 13.8 (*)    RBC 3.75 (*)    Hemoglobin 10.7 (*)    HCT 34.8 (*)    Neutro Abs 12.4 (*)    Lymphs Abs 0.4 (*)    All other components within normal limits  BASIC METABOLIC PANEL - Abnormal; Notable for the following components:   Chloride 96 (*)    Glucose, Bld 100 (*)    Creatinine, Ser 1.15 (*)    Calcium 8.7 (*)    GFR calc non Af Amer 41 (*)    GFR calc Af Amer 47 (*)    All other components within normal limits  BRAIN NATRIURETIC PEPTIDE - Abnormal; Notable for the following components:   B Natriuretic Peptide 1,134.6 (*)    All other components within normal limits  I-STAT CHEM 8, ED - Abnormal; Notable for the following components:   Chloride 95 (*)    Creatinine, Ser 1.10 (*)    Calcium, Ion 1.06 (*)    Hemoglobin 11.6 (*)    HCT 34.0 (*)  All other components within normal limits     Na 138 K 3.7  Cr   up   Lab Results  Component Value Date   CREATININE 1.10 (H) 02/07/2018   CREATININE 1.15 (H) 02/07/2018   CREATININE 0.96 11/28/2017    Trop 0.01 WBC  13.8 up from 8.2 HG/HCT  10.7  Down  from baseline see below    Component Value Date/Time   HGB 11.6 (L) 02/07/2018 1610   HGB 11.5 11/28/2017 1045   HCT 34.0 (L) 02/07/2018 1610   HCT 34.8 11/28/2017 1045    Troponin (Point of Care Test) Recent Labs    02/07/18 1609  TROPIPOC 0.01     BNP (last 3 results) Recent Labs    08/25/17 1912 11/12/17 1748 02/07/18 1601  BNP 1,571.7* 871.5* 1,134.6*      UA   ordered   CXR -evidence of CHF  ECG:  Personally reviewed by me  showing: HR : 56 Rhythm: Sinus bradycardia ST depression in multiple leads QTC 476    ED Triage Vitals  Enc Vitals Group     BP 02/07/18 1521 127/65     Pulse Rate 02/07/18 1521 (!) 55     Resp 02/07/18 1521 18     Temp 02/07/18 1521 98.7 F (37.1 C)     Temp Source 02/07/18 1521 Oral     SpO2 02/07/18 1521 90 %     Weight --      Height --      Head Circumference --      Peak Flow --      Pain Score 02/07/18 1600 0     Pain Loc --      Pain Edu? --      Excl. in [place]? --   TMAX(24)@       Latest  Blood pressure 139/60, pulse (!) 58, temperature 98.7 F (37.1 C), temperature source Oral, resp. rate 19, SpO2 93 %.      Hospitalist was called for admission for acute on chronic diastolic CHF with cardiorenal syndrome   Review of Systems:    Pertinent positives include: shortness of breath at rest dyspnea on exertion,   Constitutional:  No weight loss, night sweats, Fevers, chills, fatigue, weight loss  HEENT:  No headaches, Difficulty swallowing,Tooth/dental problems,Sore throat,  No sneezing, itching, ear ache, nasal congestion, post nasal drip,  Cardio-vascular:  No chest pain, Orthopnea, PND, anasarca, dizziness, palpitations.no Bilateral lower extremity swelling  GI:  No heartburn, indigestion, abdominal pain, nausea, vomiting, diarrhea, change in bowel habits, loss of appetite, melena, blood in stool, hematemesis Resp:   No excess mucus, no productive cough, No non-productive cough, No coughing up of blood.No change in color of mucus.No wheezing. Skin:  no rash or lesions. No jaundice GU:  no dysuria, change in color of urine, no urgency or frequency. No straining to urinate.  No flank pain.  Musculoskeletal:  No joint pain or no joint swelling. No decreased range of motion. No back pain.  Psych:  No change in mood or affect. No depression or anxiety. No memory loss.  Neuro: no localizing neurological complaints, no tingling, no weakness, no double vision, no  gait abnormality, no slurred speech, no confusion  As per HPI otherwise 10 point review of systems negative.   Past Medical History:   Past Medical History:  Diagnosis Date  . DDD (degenerative disc disease), cervical    severe/  auto fusion c2-c5  . Diverticulosis   . Hemorrhoids   .  History of adenomatous polyp of colon    1995  adenomatous polyp/  1997 & 2003  hyperplastic polyp's  . History of bladder cancer urologist-  dr [name]   s/p  TURBT's  . History of cancer of ureter   . Hypertension   . Hypothyroid   . Nocturia   . Osteoarthritis   . Osteopenia       Past Surgical History:  Procedure Laterality Date  . BACK SURGERY    . CARDIOVERSION N/A 08/09/2017   Procedure: CARDIOVERSION;  Surgeon: Larey Dresser, MD;  Location: [facility] ENDOSCOPY;  Service: Cardiovascular;  Laterality: N/A;  . CATARACT EXTRACTION W/ INTRAOCULAR LENS  IMPLANT, BILATERAL Bilateral   . CYSTOSCOPY W/ RETROGRADES  10/03/2011   Procedure: CYSTOSCOPY WITH RETROGRADE PYELOGRAM;  Surgeon: Molli Hazard, MD;  Location: [facility];  Service: Urology;;  . Consuela Mimes W/ RETROGRADES Bilateral 06/17/2016   Procedure: CYSTOSCOPY WITH RETROGRADE PYELOGRAM;  Surgeon: Alexis Frock, MD;  Location: [facility];  Service: Urology;  Laterality: Bilateral;  . CYSTOSCOPY WITH BIOPSY N/A 06/17/2016   Procedure: CYSTOSCOPY WITH BIOPSY AND FULGERATION;  Surgeon: Alexis Frock, MD;  [facility];  Service: Urology;  Laterality: N/A;  . LAMINECTOMY  2009   L3 - 4  . ORIF ANKLE FRACTURE Left 08/28/2017   Procedure: OPEN REDUCTION INTERNAL FIXATION (ORIF) ANKLE FRACTURE;  Surgeon: Shona Needles, MD;  Location: [facility] ORS;  Service: Orthopedics;  Laterality: Left;  . TRANSURETHRAL RESECTION OF BLADDER  x3 --  2009; 2010; 06-29-2010  . TRANSURETHRAL RESECTION OF BLADDER TUMOR  10/03/2011   Procedure: TRANSURETHRAL RESECTION OF BLADDER TUMOR (TURBT);  Surgeon: Molli Hazard, MD;  Location[facility];  Service: Urology;  Laterality: N/A;    Social History:  Ambulatory   walker      reports that she quit smoking about 12 years ago. Her smoking use included cigarettes. She quit after 10.00 years of use. She has never used smokeless tobacco. She reports that she drinks alcohol. She reports that she does not use drugs.     Family History:   Family History  Problem Relation Age of Onset  . Breast cancer Sister   . Stomach cancer Sister   . Hernia Sister 12       HERNIA SURGERY COMPLICATIONS  . Colon cancer Neg Hx     Allergies: Allergies  Allergen Reactions  . Flagyl [Metronidazole] Hives  . Lisinopril Cough     Prior to Admission medications   Medication Sig Start Date End Date Taking? Authorizing Provider  acetaminophen (TYLENOL) 500 MG tablet Take 500 mg by mouth every 6 (six) hours as needed for headache (pain).     [provider]  amiodarone (PACERONE) 200 MG tablet Take 1 tablet (200 mg total) by mouth daily. 12/19/17   Lendon Colonel, NP  amLODipine (NORVASC) 2.5 MG tablet Take 2.5 mg by mouth daily.    [provider]  apixaban (ELIQUIS) 5 MG TABS tablet Take 1 tablet (5 mg total) by mouth 2 (two) times daily. 07/26/17   Sherran Needs, NP  furosemide (LASIX) 40 MG tablet Take 40 mg by mouth daily.    [provider]  levothyroxine (SYNTHROID, LEVOTHROID) 50 MCG tablet Take 50 mcg by mouth daily before breakfast.     [provider]  metoprolol tartrate (LOPRESSOR) 25 MG tablet Take 25 mg by mouth 2 (two) times daily.    [provider]  polyethylene glycol (MIRALAX / GLYCOLAX) packet Take 17 g by mouth daily as needed for mild constipation.    [provider]   Physical Exam: Blood pressure 139/60, pulse (!) 58, temperature 98.7 F (37.1 C), temperature source Oral, resp. rate 19, SpO2 93 %. 1. General:  in No Acute distress   Chronically ill  -appearing 2. Psychological: Alert and Oriented 3. Head/ENT:   Moist   Mucous Membranes                          Head Non traumatic, neck supple                            Poor Dentition 4. SKIN: normal   Skin turgor,  Skin clean Dry and intact no rash 5. Heart: Regular rate and rhythm no Murmur, no Rub or gallop 6. Lungs no wheezes some crackles   7. Abdomen: Soft,  non-tender, Non distended   obese  bowel sounds present 8. Lower extremities: no clubbing, cyanosis, 2+  edema 9. Neurologically Grossly intact, moving all 4 extremities equally   10. MSK: Normal range of motion   LABS:     Recent Labs  Lab 02/07/18 1601 02/07/18 1610  WBC 13.8*  --   NEUTROABS 12.4*  --   HGB 10.7* 11.6*  HCT 34.8* 34.0*  MCV 92.8  --   PLT 227  --    Basic Metabolic Panel: Recent Labs  Lab 02/07/18 1601 02/07/18 1610  NA 138 138  K 3.7 3.7  CL 96* 95*  CO2 32  --   GLUCOSE 100* 98  BUN 17 19  CREATININE 1.15* 1.10*  CALCIUM 8.7*  --         Urine analysis:     Cultures:    Component Value Date/Time   SDES URINE, CLEAN CATCH 05/28/2008 1144   SPECREQUEST IMMUNE:NORM UT SYMPT:NEG 05/28/2008 1144   CULT  05/28/2008 1144    Multiple bacterial morphotypes present, none predominant. Suggest appropriate recollection if clinically indicated.   REPTSTATUS 05/30/2008 FINAL 05/28/2008 1144     Radiological Exams on Admission: Dg Chest 2 View  Result Date: 02/07/2018 CLINICAL DATA:  Increasing shortness of breath over the past few days. EXAM: CHEST - 2 VIEW COMPARISON:  Chest x-ray dated November 12, 2017. FINDINGS: Stable cardiomegaly. Slightly worsened diffuse interstitial thickening. Increased small bilateral pleural effusions and bibasilar atelectasis. No pneumothorax. No acute osseous abnormality. IMPRESSION: Slightly worsened pulmonary interstitial edema and small bilateral pleural effusions. Electronically Signed   By: Titus Dubin M.D.   On: 02/07/2018 17:24    Chart has been  reviewed    Assessment/Plan   84 y.o. female with medical history significant of PAF and diastolic CHF, DDD, history of bladder cancer Admitted for  acute on chronic diastolic CHF with cardiorenal syndrome   Present on Admission: . Acute on chronic diastolic (congestive) heart failure ([place]) -  - admit on telemetry,  cycle cardiac enzymes,  obtain serial ECG  to evaluate for ischemia as a cause of heart failure  monitor daily weight:  Last BNP BNP (last 3 results) Recent Labs    08/25/17 1912 11/12/17 1748 02/07/18 1601  BNP 1,571.7* 871.5* 1,134.6*      diurese with IV lasix and monitor orthostatics and creatinine to avoid over diuresis.  Order echogram to evaluate EF and valves  ACE/ARBi   Contraindicated    cardiology consulted   .  AKI (acute kidney injury) ([place]) to be secondary to hepatorenal syndrome will obtain urine electrolytes monitor carefully while being diuresed . Atrial fibrillation, chronic (HCC) -           - CHA2DS2 vas score 4 : continue current anticoagulation with   Eliquis,            -  Rate control:  Currently controlled with  Metoprolol,  will continue        - Rhythm control: Continue amiodarone  . Cardiorenal syndrome with renal failure - mild monitor rena function avoid nephrotoxic medicaiton . HTN (hypertension) - stable continue home medications . Hypothyroid - check TSH cont synthroid . Normocytic anemia - check anemia panel  UTI - order urine culture, rocephin since pt not improving on macrobid Other plan as per orders.  DVT prophylaxis:  Eliquis   Code Status:    DNR/DNI  as per patient   I had personally discussed CODE STATUS with patient   Family Communication:   Family not at  Bedside   Disposition Plan:                            Back to current facility when stable                                           Would benefit from PT/OT eval prior to DC   ordered                    Social Work  consulted                           Consults called: email cardiology   Admission status:    inpatient     Level of care   tele           Toy Baker 02/07/2018, 10:16 PM    [facility]  Pager [phone]   after 2 AM please page floor coverage PA If 7AM-7PM, please contact the day team taking care of the patient  [email or web address]  Password [email or web address]

## 2018-02-07 NOTE — ED Notes (Signed)
Patient transported to X-ray 

## 2018-02-07 NOTE — ED Provider Notes (Signed)
[facility] EMERGENCY DEPARTMENT Provider Note   CSN: [id number] Arrival date & time: 02/07/18  1516     History   Chief Complaint Chief Complaint  Patient presents with  . Shortness of Breath    HPI Rebecca Norman is a 84 y.o. female.  84 yo F with a cc of with a chief complaint of shortness of breath.  This been going on for the past week.  She uses 3 L oxygen at home for her diastolic heart failure.  She is unsure what is wrong with feels that her lower extremities have been increasingly swollen over the past week or so.  She denies cough congestion fevers chills or myalgias.  Denies chest pain.  Has used 1 more pillow at home so that she is able to breathe when she lays back.  The history is provided by the patient.  Shortness of Breath  This is a new problem. The average episode lasts 1 week. The problem occurs continuously.The current episode started 2 days ago. The problem has been gradually worsening. Pertinent negatives include no fever, no headaches, no rhinorrhea, no wheezing, no chest pain and no vomiting. She has tried nothing for the symptoms. The treatment provided no relief. She has had prior hospitalizations. She has had prior ED visits. Associated medical issues include heart failure.    Past Medical History:  Diagnosis Date  . DDD (degenerative disc disease), cervical    severe/  auto fusion c2-c5  . Diverticulosis   . Hemorrhoids   . History of adenomatous polyp of colon    1995  adenomatous polyp/  1997 & 2003  hyperplastic polyp's  . History of bladder cancer urologist-  dr [name]   s/p  TURBT's  . History of cancer of ureter   . Hypertension   . Hypothyroid   . Nocturia   . Osteoarthritis   . Osteopenia     Patient Active Problem List   Diagnosis Date Noted  . Acute on chronic diastolic (congestive) heart failure ([place]) 11/21/2017  . Paroxysmal atrial fibrillation ([place]) 11/21/2017  . AKI (acute kidney injury) ([place]) 11/15/2017  .  Cardiorenal syndrome with renal failure 11/15/2017  . Acute diastolic CHF (congestive heart failure) ([place]) 11/12/2017  . Normocytic anemia 11/12/2017  . CHF (congestive heart failure) ([place]) 11/12/2017  . Fall 08/25/2017  . Closed left ankle fracture 08/25/2017  . Chronic diastolic CHF (congestive heart failure) ([place]) 08/25/2017  . Closed avulsion fracture of medial malleolus of left tibia   . Fall at home   . Current use of long term anticoagulation 08/11/2017  . Atrial fibrillation, chronic ([place]) 08/06/2017  . Hypothyroid 06/13/2017  . Palpitations 06/13/2017  . Abnormal EKG 06/13/2017  . Heart murmur 06/13/2017  . S/P laminectomy 07/10/2013  . HTN (hypertension) 07/10/2013  . BLADDER CANCER 12/17/2008  . DIVERTICULOSIS OF COLON 12/12/2008  . COLONIC POLYPS, HYPERPLASTIC, HX OF 12/12/2008    Past Surgical History:  Procedure Laterality Date  . BACK SURGERY    . CARDIOVERSION N/A 08/09/2017   Procedure: CARDIOVERSION;  Surgeon: Larey Dresser, MD;  Location: [facility] ENDOSCOPY;  Service: Cardiovascular;  Laterality: N/A;  . CATARACT EXTRACTION W/ INTRAOCULAR LENS  IMPLANT, BILATERAL Bilateral   . CYSTOSCOPY W/ RETROGRADES  10/03/2011   Procedure: CYSTOSCOPY WITH RETROGRADE PYELOGRAM;  Surgeon: Molli Hazard, MD;  Location: [facility];  Service: Urology;;  . Consuela Mimes W/ RETROGRADES Bilateral 06/17/2016   Procedure: CYSTOSCOPY WITH RETROGRADE PYELOGRAM;  Surgeon: Alexis Frock, MD;  Location: [facility];  Service: Urology;  Laterality: Bilateral;  . CYSTOSCOPY WITH BIOPSY N/A 06/17/2016   Procedure: CYSTOSCOPY WITH BIOPSY AND FULGERATION;  Surgeon: Alexis Frock, MD;  Location: [facility];  Service: Urology;  Laterality: N/A;  . LAMINECTOMY  2009   L3 - 4  . ORIF ANKLE FRACTURE Left 08/28/2017   Procedure: OPEN REDUCTION INTERNAL FIXATION (ORIF) ANKLE FRACTURE;  Surgeon: Shona Needles, MD;  Location: [facility] ORS;  Service:  Orthopedics;  Laterality: Left;  . TRANSURETHRAL RESECTION OF BLADDER  x3 --  2009; 2010; 06-29-2010  . TRANSURETHRAL RESECTION OF BLADDER TUMOR  10/03/2011   Procedure: TRANSURETHRAL RESECTION OF BLADDER TUMOR (TURBT);  Surgeon: Molli Hazard, MD;  Location: [facility];  Service: Urology;  Laterality: N/A;     OB History   None      Home Medications    Prior to Admission medications   Medication Sig Start Date End Date Taking? Authorizing Provider  acetaminophen (TYLENOL) 500 MG tablet Take 500 mg by mouth every 6 (six) hours as needed for headache (pain).     [provider]  amiodarone (PACERONE) 200 MG tablet Take 1 tablet (200 mg total) by mouth daily. 12/19/17   Lendon Colonel, NP  amLODipine (NORVASC) 2.5 MG tablet Take 2.5 mg by mouth daily.    [provider]  apixaban (ELIQUIS) 5 MG TABS tablet Take 1 tablet (5 mg total) by mouth 2 (two) times daily. 07/26/17   Sherran Needs, NP  furosemide (LASIX) 40 MG tablet Take 40 mg by mouth daily.    [provider]  levothyroxine (SYNTHROID, LEVOTHROID) 50 MCG tablet Take 50 mcg by mouth daily before breakfast.     [provider]  metoprolol tartrate (LOPRESSOR) 25 MG tablet Take 25 mg by mouth 2 (two) times daily.    [provider]  polyethylene glycol (MIRALAX / GLYCOLAX) packet Take 17 g by mouth daily as needed for mild constipation.    [provider]    Family History Family History  Problem Relation Age of Onset  . Breast cancer Sister   . Stomach cancer Sister   . Hernia Sister 24       HERNIA SURGERY COMPLICATIONS  . Colon cancer Neg Hx     Social History Social History   Tobacco Use  . Smoking status: Former Smoker    Years: 10.00    Types: Cigarettes    Last attempt to quit: 09/25/2005    Years since quitting: 12.3  . Smokeless tobacco: Never Used  Substance Use Topics  . Alcohol use: Yes  . Drug use: No     Allergies     Flagyl [metronidazole] and Lisinopril   Review of Systems Review of Systems  Constitutional: Negative for chills and fever.  HENT: Negative for congestion and rhinorrhea.   Eyes: Negative for redness and visual disturbance.  Respiratory: Positive for shortness of breath. Negative for wheezing.   Cardiovascular: Negative for chest pain and palpitations.  Gastrointestinal: Negative for nausea and vomiting.  Genitourinary: Negative for dysuria and urgency.  Musculoskeletal: Negative for arthralgias and myalgias.  Skin: Negative for pallor and wound.  Neurological: Negative for dizziness and headaches.     Physical Exam Updated Vital Signs BP 139/60   Pulse (!) 58   Temp 98.7 F (37.1 C) (Oral)   Resp 19   SpO2 93%   Physical Exam  Constitutional: She is oriented to person, place, and  time. She appears well-developed and well-nourished. No distress.  HENT:  Head: Normocephalic and atraumatic.  Eyes: Pupils are equal, round, and reactive to light. EOM are normal.  Neck: Normal range of motion. Neck supple.  Cardiovascular: Normal rate and regular rhythm. Exam reveals no gallop and no friction rub.  No murmur heard. Pulmonary/Chest: Effort normal. She has no wheezes. She has rales (bases bilaterally).  tachypnea  Abdominal: Soft. She exhibits no distension. There is no tenderness.  Musculoskeletal: She exhibits edema (2+ to the knees). She exhibits no tenderness.  Neurological: She is alert and oriented to person, place, and time.  Skin: Skin is warm and dry. She is not diaphoretic.  Psychiatric: She has a normal mood and affect. Her behavior is normal.  Nursing note and vitals reviewed.    ED Treatments / Results  Labs (all labs ordered are listed, but only abnormal results are displayed) Labs Reviewed  CBC WITH DIFFERENTIAL/PLATELET - Abnormal; Notable for the following components:      Result Value   WBC 13.8 (*)    RBC 3.75 (*)    Hemoglobin 10.7 (*)    HCT 34.8  (*)    Neutro Abs 12.4 (*)    Lymphs Abs 0.4 (*)    All other components within normal limits  BASIC METABOLIC PANEL - Abnormal; Notable for the following components:   Chloride 96 (*)    Glucose, Bld 100 (*)    Creatinine, Ser 1.15 (*)    Calcium 8.7 (*)    GFR calc non Af Amer 41 (*)    GFR calc Af Amer 47 (*)    All other components within normal limits  BRAIN NATRIURETIC PEPTIDE - Abnormal; Notable for the following components:   B Natriuretic Peptide 1,134.6 (*)    All other components within normal limits  I-STAT CHEM 8, ED - Abnormal; Notable for the following components:   Chloride 95 (*)    Creatinine, Ser 1.10 (*)    Calcium, Ion 1.06 (*)    Hemoglobin 11.6 (*)    HCT 34.0 (*)    All other components within normal limits  I-STAT TROPONIN, ED    EKG EKG Interpretation  Date/Time:  Wednesday February 07 2018 15:34:52 EDT Ventricular Rate:  56 PR Interval:    QRS Duration: 112 QT Interval:  493 QTC Calculation: 476 R Axis:   51 Text Interpretation:  Sinus rhythm Short PR interval Probable LVH with secondary repol abnrm ST depression, consider ischemia, diffuse lds similar st depression laterally Otherwise no significant change Confirmed by Deno Etienne [phone]) on 02/07/2018 3:57:16 PM   Radiology Dg Chest 2 View  Result Date: 02/07/2018 CLINICAL DATA:  Increasing shortness of breath over the past few days. EXAM: CHEST - 2 VIEW COMPARISON:  Chest x-ray dated November 12, 2017. FINDINGS: Stable cardiomegaly. Slightly worsened diffuse interstitial thickening. Increased small bilateral pleural effusions and bibasilar atelectasis. No pneumothorax. No acute osseous abnormality. IMPRESSION: Slightly worsened pulmonary interstitial edema and small bilateral pleural effusions. Electronically Signed   By: Titus Dubin M.D.   On: 02/07/2018 17:24    Procedures Procedures (including critical care time)  Medications Ordered in ED Medications  potassium chloride SA (K-DUR,KLOR-CON)  CR tablet 40 mEq (has no administration in time range)  magnesium oxide (MAG-OX) tablet 800 mg (has no administration in time range)  furosemide (LASIX) injection 40 mg (has no administration in time range)     Initial Impression / Assessment and Plan / ED Course  I have  reviewed the triage vital signs and the nursing notes.  Pertinent labs & imaging results that were available during my care of the patient were reviewed by me and considered in my medical decision making (see chart for details).     84 yo F with a chief complaint of shortness of breath.  The patient is likely fluid overloaded based on history and exam.  Will obtain labs chest x-ray give Lasix once we know the potassium and renal function.  Patient's potassium is at the lower end of normal.  Given a dose of potassium and magnesium.  Given a dose of Lasix.  I reassessed the patient who is continuing to be short of breath at rest.  I feel unlikely that this 1 dose of Lasix will enable her to leave the emergency department and go home.  Will discuss with the hospitalist for admission.  The patients results and plan were reviewed and discussed.   Any x-rays performed were independently reviewed by myself.   Differential diagnosis were considered with the presenting HPI.  Medications  furosemide (LASIX) injection 40 mg (has no administration in time range)  potassium chloride SA (K-DUR,KLOR-CON) CR tablet 40 mEq (40 mEq Oral Given 02/07/18 1800)  magnesium oxide (MAG-OX) tablet 800 mg (800 mg Oral Given 02/07/18 1800)  furosemide (LASIX) injection 40 mg (40 mg Intravenous Given 02/07/18 1801)    Vitals:   02/07/18 1900 02/07/18 1915 02/07/18 1945 02/07/18 2000  BP: (!) 146/71 132/74 115/63 (!) 146/59  Pulse:  (!) 58 (!) 57 (!) 59  Resp: (!) 22  (!) 21 17  Temp:      TempSrc:      SpO2: 93% 90% 92% 90%    Final diagnoses:  Acute on chronic diastolic congestive heart failure (HCC)    Admission/ observation were discussed  with the admitting physician, patient and/or family and they are comfortable with the plan.    Final Clinical Impressions(s) / ED Diagnoses   Final diagnoses:  Acute on chronic diastolic congestive heart failure [facility])    ED Discharge Orders    None       Deno Etienne, DO 02/07/18 2030

## 2018-02-08 ENCOUNTER — Other Ambulatory Visit: Payer: Self-pay

## 2018-02-08 DIAGNOSIS — I10 Essential (primary) hypertension: Secondary | ICD-10-CM

## 2018-02-08 DIAGNOSIS — N390 Urinary tract infection, site not specified: Secondary | ICD-10-CM

## 2018-02-08 DIAGNOSIS — E034 Atrophy of thyroid (acquired): Secondary | ICD-10-CM

## 2018-02-08 DIAGNOSIS — I482 Chronic atrial fibrillation: Secondary | ICD-10-CM

## 2018-02-08 DIAGNOSIS — I5033 Acute on chronic diastolic (congestive) heart failure: Secondary | ICD-10-CM

## 2018-02-08 LAB — COMPREHENSIVE METABOLIC PANEL
ALK PHOS: 53 U/L (ref 38–126)
ALT: 8 U/L — AB (ref 14–54)
AST: 11 U/L — AB (ref 15–41)
Albumin: 2.9 g/dL — ABNORMAL LOW (ref 3.5–5.0)
Anion gap: 10 (ref 5–15)
BILIRUBIN TOTAL: 0.8 mg/dL (ref 0.3–1.2)
BUN: 19 mg/dL (ref 6–20)
CALCIUM: 8.6 mg/dL — AB (ref 8.9–10.3)
CHLORIDE: 97 mmol/L — AB (ref 101–111)
CO2: 32 mmol/L (ref 22–32)
CREATININE: 1.1 mg/dL — AB (ref 0.44–1.00)
GFR, EST AFRICAN AMERICAN: 50 mL/min — AB (ref >60)
GFR, EST NON AFRICAN AMERICAN: 43 mL/min — AB (ref >60)
Glucose, Bld: 90 mg/dL (ref 65–99)
Potassium: 3.9 mmol/L (ref 3.5–5.1)
Sodium: 139 mmol/L (ref 135–145)
TOTAL PROTEIN: 5.7 g/dL — AB (ref 6.5–8.1)

## 2018-02-08 LAB — CBC
HCT: 32.8 % — ABNORMAL LOW (ref 36.0–46.0)
Hemoglobin: 10.2 g/dL — ABNORMAL LOW (ref 12.0–15.0)
MCH: 28.6 pg (ref 26.0–34.0)
MCHC: 31.1 g/dL (ref 30.0–36.0)
MCV: 91.9 fL (ref 78.0–100.0)
PLATELETS: 209 10*3/uL (ref 150–400)
RBC: 3.57 MIL/uL — ABNORMAL LOW (ref 3.87–5.11)
RDW: 14.6 % (ref 11.5–15.5)
WBC: 10.2 10*3/uL (ref 4.0–10.5)

## 2018-02-08 LAB — TSH: TSH: 2.958 u[IU]/mL (ref 0.350–4.500)

## 2018-02-08 LAB — MAGNESIUM: Magnesium: 1.9 mg/dL (ref 1.7–2.4)

## 2018-02-08 LAB — IRON AND TIBC
IRON: 34 ug/dL (ref 28–170)
SATURATION RATIOS: 13 % (ref 10.4–31.8)
TIBC: 265 ug/dL (ref 250–450)
UIBC: 231 ug/dL

## 2018-02-08 LAB — PHOSPHORUS: Phosphorus: 3.4 mg/dL (ref 2.5–4.6)

## 2018-02-08 LAB — RETICULOCYTES
RBC.: 3.45 MIL/uL — AB (ref 3.87–5.11)
Retic Count, Absolute: 89.7 10*3/uL (ref 19.0–186.0)
Retic Ct Pct: 2.6 % (ref 0.4–3.1)

## 2018-02-08 LAB — FERRITIN: FERRITIN: 94 ng/mL (ref 11–307)

## 2018-02-08 LAB — TROPONIN I
Troponin I: <0.03 ng/mL (ref <0.03)
Troponin I: <0.03 ng/mL (ref <0.03)

## 2018-02-08 LAB — VITAMIN B12: Vitamin B-12: 105 pg/mL — ABNORMAL LOW (ref 180–914)

## 2018-02-08 LAB — FOLATE: FOLATE: 7.5 ng/mL (ref >5.9)

## 2018-02-08 MED ORDER — FUROSEMIDE 10 MG/ML IJ SOLN
40.0000 mg | Freq: Two times a day (BID) | INTRAMUSCULAR | Status: DC
  Administered 2018-02-08 (×2): 40 mg via INTRAVENOUS
  Filled 2018-02-08 (×2): qty 4

## 2018-02-08 MED ORDER — LEVOTHYROXINE SODIUM 50 MCG PO TABS
50.0000 ug | ORAL_TABLET | ORAL | Status: DC
  Administered 2018-02-08 – 2018-02-10 (×2): 50 ug via ORAL
  Filled 2018-02-08 (×2): qty 1

## 2018-02-08 MED ORDER — LEVOTHYROXINE SODIUM 75 MCG PO TABS
75.0000 ug | ORAL_TABLET | ORAL | Status: DC
  Administered 2018-02-08 – 2018-02-10 (×2): 75 ug via ORAL
  Filled 2018-02-08 (×2): qty 1

## 2018-02-08 MED ORDER — METOPROLOL TARTRATE 12.5 MG HALF TABLET
12.5000 mg | ORAL_TABLET | Freq: Two times a day (BID) | ORAL | Status: DC
  Administered 2018-02-08 – 2018-02-10 (×3): 12.5 mg via ORAL
  Filled 2018-02-08 (×4): qty 1

## 2018-02-08 NOTE — Progress Notes (Signed)
Admitted pt to rm 3E20 from ED, pt alert and oriented, denied pain at this time, oriented to room, call bell placed within reach, placed on cardiac monitor, CCMD made aware. Bed alarm activated.

## 2018-02-08 NOTE — Evaluation (Signed)
Occupational Therapy Evaluation Patient Details Name: Rebecca Norman MRN: [id number] DOB: [birth year] Today's Date: 02/08/2018    History of Present Illness this 84 y.o. female adittied with progressively worsening SOB.  Dx:  acute on chronic Diastolic HF with underlying h/o chronic hypoxic respiratory failure; A-Fib, normocytic anemia, acute UTI.  PMH includes: osteopenia, HTN, h/o bladder and ureter CA, s/p ORIF of ankle due to fall.  Pt is 02 dependent    Clinical Impression   Pt admitted with above. She demonstrates the below listed deficits and will benefit from continued OT to maximize safety and independence with BADLs.  Pt presents to OT with generalized weakness, decreased activity tolerance, impaired balance, cognitive impairments.  Pt currently requires min - max A for ADLs and min - mod A for functional mobility - she fatigues quickly.  She lives alone with family assisting with IADLs.  She has had multiple admissions, and has been to SNF level rehab several times of late.  Feel she will need return to SNF for rehab, and may need to think about transitioning to ALF vs LTC.   Will follow acutely.       Follow Up Recommendations  SNF;Supervision/Assistance - 24 hour    Equipment Recommendations  None recommended by OT    Recommendations for Other Services       Precautions / Restrictions Precautions Precautions: Fall      Mobility Bed Mobility Overal bed mobility: Needs Assistance Bed Mobility: Supine to Sit     Supine to sit: Min assist     General bed mobility comments: assist to raise trunk   Transfers Overall transfer level: Needs assistance Equipment used: Rolling walker (2 wheeled);1 person hand held assist Transfers: Sit to/from [facility] Sit to Stand: Min assist Stand pivot transfers: Mod assist;Min assist       General transfer comment: Pt urgently needed to use BSC, and required mod A for that transfer with UE support.  Using RW,  pt was able to trasnfer with min A for balance and safety     Balance Overall balance assessment: Needs assistance Sitting-balance support: Feet supported Sitting balance-Leahy Scale: Fair                                     ADL either performed or assessed with clinical judgement   ADL Overall ADL's : Needs assistance/impaired Eating/Feeding: Independent   Grooming: Wash/dry hands;Wash/dry face;Oral care;Brushing hair;Set up;Sitting   Upper Body Bathing: Set up;Supervision/ safety;Sitting   Lower Body Bathing: Moderate assistance;Sit to/from stand   Upper Body Dressing : Minimal assistance;Sitting   Lower Body Dressing: Maximal assistance;Sit to/from stand   Toilet Transfer: Moderate assistance;Stand-pivot;BSC;RW Toilet Transfer Details (indicate cue type and reason): step by step cues provided  Toileting- Clothing Manipulation and Hygiene: Maximal assistance;Sit to/from stand Toileting - Clothing Manipulation Details (indicate cue type and reason): Pt unable to maintain sanitary environment while performing peri care - had stool on hands, buttocks, BSC, etc, and required intervention from OT      Functional mobility during ADLs: Minimal assistance;Rolling walker General ADL Comments: pt fatigues rapidly with DOE 3/4 on 3L supplemental 02      Vision         Perception     Praxis      Pertinent Vitals/Pain       Hand Dominance     Extremity/Trunk Assessment Upper Extremity Assessment Upper Extremity Assessment:  Generalized weakness   Lower Extremity Assessment Lower Extremity Assessment: Defer to PT evaluation   Cervical / Trunk Assessment Cervical / Trunk Assessment: Kyphotic   Communication Communication Communication: HOH   Cognition Arousal/Alertness: Awake/alert Behavior During Therapy: WFL for tasks assessed/performed Overall Cognitive Status: Impaired/Different from baseline Area of Impairment: Problem solving                              Problem Solving: Requires verbal cues General Comments: Pt requires min cues for problem solving and sequencing    General Comments  [id number]    Exercises     Shoulder Instructions      Home Living Family/patient expects to be discharged to:: Private residence Living Arrangements: Alone Available Help at Discharge: Family;Available PRN/intermittently Type of Home: House Home Access: Stairs to enter [employer] of Steps: 1   Home Layout: One level     Bathroom Shower/Tub: Occupational psychologist: Standard Bathroom Accessibility: Yes   Home Equipment: Environmental consultant - 2 wheels;Bedside commode;Walker - 4 wheels;Toilet riser;Grab bars - toilet          Prior Functioning/Environment Level of Independence: Needs assistance  Gait / Transfers Assistance Needed: uses RW for ambulation ADL's / Homemaking Assistance Needed: pt reports she has a HHaide 2-3/wk who assists with shoering.  Family assists interminttently with IADLs    Comments: Pt reports she has had multiple SNF rehab stays in past 6 mos         OT Problem List: Decreased strength;Decreased activity tolerance;Impaired balance (sitting and/or standing);Decreased safety awareness;Decreased knowledge of use of DME or AE;Cardiopulmonary status limiting activity      OT Treatment/Interventions:      OT Goals(Current goals can be found in the care plan section) Acute Rehab OT Goals Patient Stated Goal: to regain strength and go home  OT Goal Formulation: With patient Time For Goal Achievement: 02/22/18 Potential to Achieve Goals: Good ADL Goals Pt Will Perform Grooming: with min assist;standing Pt Will Perform Upper Body Bathing: with set-up;sitting Pt Will Perform Lower Body Bathing: with min assist;sit to/from stand Pt Will Perform Upper Body Dressing: with set-up;sitting Pt Will Perform Lower Body Dressing: with min assist;sit to/from stand Pt Will Transfer to Toilet: with min  assist;ambulating;bedside commode Pt Will Perform Toileting - Clothing Manipulation and hygiene: sit to/from stand;with min assist  OT Frequency: Min 2X/week   Barriers to D/C: Decreased caregiver support          Co-evaluation              AM-PAC PT "6 Clicks" Daily Activity     Outcome Measure Help from another person eating meals?: None Help from another person taking care of personal grooming?: A Little Help from another person toileting, which includes using toliet, bedpan, or urinal?: A Lot Help from another person bathing (including washing, rinsing, drying)?: A Lot Help from another person to put on and taking off regular upper body clothing?: A Little Help from another person to put on and taking off regular lower body clothing?: A Lot 6 Click Score: 16   End of Session Equipment Utilized During Treatment: Oxygen;Rolling walker;Gait belt Nurse Communication: Mobility status  Activity Tolerance: Patient limited by fatigue Patient left: in chair;with call bell/phone within reach;with chair alarm set  OT Visit Diagnosis: Unsteadiness on feet (R26.81)                Time: 5409-8119 OT Time Calculation (  min): 45 min Charges:  OT General Charges $OT Visit: 1 Visit OT Evaluation $OT Eval Moderate Complexity: 1 Mod OT Treatments $Self Care/Home Management : 23-37 mins G-Codes:     [facility], OTR/L [phone]   Lucille Passy M 02/08/2018, 11:58 AM

## 2018-02-08 NOTE — Evaluation (Signed)
Physical Therapy Evaluation Patient Details Name: Rebecca Norman MRN: [id number] DOB: Jan 21, 1925 Today's Date: 02/08/2018   History of Present Illness  84 y.o. female admitted with progressively worsening SOB.  Dx:  acute on chronic Diastolic HF with underlying h/o chronic hypoxic respiratory failure; A-Fib, normocytic anemia, acute UTI.  PMH includes: osteopenia, HTN, h/o bladder and ureter CA, s/p ORIF of ankle due to fall.  Pt is 02 dependent     Clinical Impression  Pt admitted with above diagnosis. Pt currently with functional limitations due to the deficits listed below (see PT Problem List). PTA, pt living alone ambulating with RW, receiving assistance from family PRN for ADLs. Upon eval, pt presents with weakness and imbalance. Currently requiring hands on physical assistance during functional transfers and ambulation. Pts family reports she has had a number of falls utilizing RW at home, and presents today as a fall risk. Will benefit from SNF to improve safety in the short term, and discussed with family they may need to consider longer term options (ALF vs LTAH etc).  Pt will benefit from skilled PT to increase their independence and safety with mobility to allow discharge to the venue listed below.       Follow Up Recommendations SNF;Supervision/Assistance - 24 hour    Equipment Recommendations  (TBD next venue)    Recommendations for Other Services       Precautions / Restrictions Precautions Precautions: Fall Precaution Comments: incontinent      Mobility  Bed Mobility Overal bed mobility: Needs Assistance Bed Mobility: Supine to Sit     Supine to sit: Min assist     General bed mobility comments: assist to raise trunk   Transfers Overall transfer level: Needs assistance Equipment used: Rolling walker (2 wheeled);1 person hand held assist Transfers: Sit to/from [facility] Sit to Stand: Min assist Stand pivot transfers: Min assist        General transfer comment: Pt urgently needed to use BSC, and required mod A for that transfer with UE support.  Using RW, pt was able to trasnfer with min A for balance and safety   Ambulation/Gait Ambulation/Gait assistance: Min guard;Min assist Ambulation Distance (Feet): 20 Feet Assistive device: Rolling walker (2 wheeled) Gait Pattern/deviations: Step-to pattern Gait velocity: decrease   General Gait Details: Patient ambulating with RW unsteady, min A at most to stabilize. Pt poor at avoiding objects while ambulating and collides with fruniture.   Stairs            Wheelchair Mobility    Modified Rankin (Stroke Patients Only)       Balance Overall balance assessment: Needs assistance;History of Falls Sitting-balance support: Feet supported Sitting balance-Leahy Scale: Fair     Standing balance support: Bilateral upper extremity supported Standing balance-Leahy Scale: Poor                               Pertinent Vitals/Pain Pain Assessment: No/denies pain    Home Living Family/patient expects to be discharged to:: Skilled nursing facility Living Arrangements: Alone Available Help at Discharge: Family;Available PRN/intermittently Type of Home: House Home Access: Stairs to enter   Entrance Stairs-Number of Steps: 1 Home Layout: One level Home Equipment: Walker - 2 wheels;Bedside commode;Walker - 4 wheels;Toilet riser;Grab bars - toilet      Prior Function Level of Independence: Needs assistance   Gait / Transfers Assistance Needed: uses RW for ambulation  ADL's / Homemaking Assistance Needed: pt  reports she has a HHaide 2-3/wk who assists with shoering.  Family assists interminttently with IADLs   Comments: daughter reports that patient has fallen a number of times lately while utilizing RW.      Hand Dominance        Extremity/Trunk Assessment   Upper Extremity Assessment Upper Extremity Assessment: Defer to OT evaluation    Lower  Extremity Assessment Lower Extremity Assessment: Overall WFL for tasks assessed(BLE strength 4-/5 gross)    Cervical / Trunk Assessment Cervical / Trunk Assessment: Kyphotic  Communication   Communication: HOH  Cognition Arousal/Alertness: Awake/alert Behavior During Therapy: WFL for tasks assessed/performed Overall Cognitive Status: Impaired/Different from baseline Area of Impairment: Problem solving                             Problem Solving: Requires verbal cues General Comments: Pt requires min cues for problem solving and sequencing       General Comments General comments (skin integrity, edema, etc.): HR83    Exercises General Exercises - Lower Extremity Long Arc Quad: 10 reps;Both   Assessment/Plan    PT Assessment Patient needs continued PT services  PT Problem List Decreased strength;Decreased range of motion;Decreased activity tolerance;Decreased balance;Decreased mobility;Decreased coordination;Decreased knowledge of precautions;Decreased knowledge of use of DME;Decreased cognition;Decreased safety awareness;Cardiopulmonary status limiting activity       PT Treatment Interventions DME instruction;Gait training;Functional mobility training;Stair training;Therapeutic activities;Therapeutic exercise;Balance training    PT Goals (Current goals can be found in the Care Plan section)  Acute Rehab PT Goals Patient Stated Goal: to regain strength and go home  PT Goal Formulation: With patient Time For Goal Achievement: 02/22/18 Potential to Achieve Goals: Fair    Frequency Min 2X/week   Barriers to discharge Decreased caregiver support patient lives alone with PRN support from family    Co-evaluation               AM-PAC PT "6 Clicks" Daily Activity  Outcome Measure Difficulty turning over in bed (including adjusting bedclothes, sheets and blankets)?: Unable Difficulty moving from lying on back to sitting on the side of the bed? :  Unable Difficulty sitting down on and standing up from a chair with arms (e.g., wheelchair, bedside commode, etc,.)?: Unable Help needed moving to and from a bed to chair (including a wheelchair)?: A Little Help needed walking in hospital room?: A Lot Help needed climbing 3-5 steps with a railing? : A Lot 6 Click Score: 10    End of Session Equipment Utilized During Treatment: Gait belt;Oxygen(3L) Activity Tolerance: Patient tolerated treatment well Patient left: in chair;with call bell/phone within reach;with family/visitor present;with chair alarm set Nurse Communication: Mobility status PT Visit Diagnosis: Unsteadiness on feet (R26.81);Difficulty in walking, not elsewhere classified (R26.2);History of falling (Z91.81)    Time: 1200-1230 PT Time Calculation (min) (ACUTE ONLY): 30 min   Charges:   PT Evaluation $PT Eval Low Complexity: 1 Low PT Treatments $Gait Training: 8-22 mins   PT G Codes:        Reinaldo Berber, PT, DPT Acute Rehab Services Pager: [phone]    Reinaldo Berber 02/08/2018, 2:36 PM

## 2018-02-08 NOTE — Progress Notes (Signed)
PT Cancellation Note  Patient Details Name: Rebecca Norman MRN: [id number] DOB: 03/06/1925   Cancelled Treatment:     attempted to eval patient at 1045, pt currently working with OT. Will cont to follow  Reinaldo Berber, PT, DPT Acute Rehab Services Pager: [phone]     Reinaldo Berber 02/08/2018, 10:56 AM

## 2018-02-08 NOTE — Consult Note (Signed)
Cardiology Consultation:   Patient ID: Rebecca Norman; [id number]; 28-Aug-1925   Admit date: 02/07/2018 Date of Consult: 02/08/2018  Primary Care Provider: [name], MD Primary Cardiologist: Kirk Ruths, MD   Patient Profile:   Rebecca Norman is a 84 y.o. female with a hx of diastolic heart failure, atrial fibrillation on Eliquis and amiodarone, and chronic respiratory failure on home oxygen 3L  who is being seen today for the evaluation of acute on chronic heart failure at the request of Dr. Tana Coast.  History of Present Illness:   Rebecca Norman has a history of DCCV for atrial fib with runs of PAT. She is now on amiodarone, Eliquis and metoprolol. She was last seen in our office recently on 01/31/18 by Jory Sims, NP. At that time she was complaining of fatigue and felt like she had a UTI. She also noted increased edema but wt was down 2 lbs. She was told that she could reduce her lasix from 40 mg to 20 mg for a few days to see if she gained more energy. Her BP was low normal and it was felt that she may need to permanently reduce her lasix. Her edema was felt to possibly be related to amlodipine. She was going to go to her PCP the next day to address her urinary symptoms.   Rebecca Norman presented to the ED on 02/07/18 for progressively worsening shortness of breath over the past week, orthopnea and PND. Her BNP is 1134 and chest xray showed slightly worsened pulmonary interstitial edema and small bilateral pleural effusions. Troponins have been negative X 3.   On my assessment [name] is sitting up in a chair and her daughter, who is a Marine scientist, is present. Rebecca Norman states that she has been having progressively worsening shortness of breath over the last week. She has been awakened in the early mornings with shortness of breath and has added a pillow for sleep, using 2 pillows. She noted increased pedal edema but her wt had not changed more than a pound. The patient lives alone and is  very independent. Her daughter notes that recently she has been more fatigued and has not been able to cook her meals. She does have a visiting home health nurse who noted some abnormalities in her vital signs yesterday including low oxygen sats and referred the pt to the ED.  Rebecca Norman says that she saw her PCP for the UTI symptoms and had a positive urine culture. She was started on antibiotics. Her thyroid function was found to be down and her synthroid was increased. She has taken 2 days of the increased dose. Also she had been continuing on the reduced dose of lasix 20 mg daily.   After diuresis with IV lasix 40 mg bid she is breathing better with improved edema.    Past Medical History:  Diagnosis Date  . DDD (degenerative disc disease), cervical    severe/  auto fusion c2-c5  . Diverticulosis   . Hemorrhoids   . History of adenomatous polyp of colon    1995  adenomatous polyp/  1997 & 2003  hyperplastic polyp's  . History of bladder cancer urologist-  dr [name]   s/p  TURBT's  . History of cancer of ureter   . Hypertension   . Hypothyroid   . Nocturia   . Osteoarthritis   . Osteopenia     Past Surgical History:  Procedure Laterality Date  . BACK SURGERY    . CARDIOVERSION N/A  08/09/2017   Procedure: CARDIOVERSION;  Surgeon: Larey Dresser, MD;  Location: [facility] ENDOSCOPY;  Service: Cardiovascular;  Laterality: N/A;  . CATARACT EXTRACTION W/ INTRAOCULAR LENS  IMPLANT, BILATERAL Bilateral   . CYSTOSCOPY W/ RETROGRADES  10/03/2011   Procedure: CYSTOSCOPY WITH RETROGRADE PYELOGRAM;  Surgeon: Molli Hazard, MD;  Location: [facility];  Service: Urology;;  . Consuela Mimes W/ RETROGRADES Bilateral 06/17/2016   Procedure: CYSTOSCOPY WITH RETROGRADE PYELOGRAM;  Surgeon: Alexis Frock, MD;  Location: [facility];  Service: Urology;  Laterality: Bilateral;  . CYSTOSCOPY WITH BIOPSY N/A 06/17/2016   Procedure: CYSTOSCOPY WITH BIOPSY AND FULGERATION;   Surgeon: Alexis Frock, MD;  Location: [facility];  Service: Urology;  Laterality: N/A;  . LAMINECTOMY  2009   L3 - 4  . ORIF ANKLE FRACTURE Left 08/28/2017   Procedure: OPEN REDUCTION INTERNAL FIXATION (ORIF) ANKLE FRACTURE;  Surgeon: Shona Needles, MD;  Location: [facility] ORS;  Service: Orthopedics;  Laterality: Left;  . TRANSURETHRAL RESECTION OF BLADDER  x3 --  2009; 2010; 06-29-2010  . TRANSURETHRAL RESECTION OF BLADDER TUMOR  10/03/2011   Procedure: TRANSURETHRAL RESECTION OF BLADDER TUMOR (TURBT);  Surgeon: Molli Hazard, MD;  Location[facility];  Service: Urology;  Laterality: N/A;     Home Medications:  Prior to Admission medications   Medication Sig Start Date End Date Taking? Authorizing Provider  acetaminophen (TYLENOL) 500 MG tablet Take 500 mg by mouth every 6 (six) hours as needed for headache (pain).    Yes [provider]  amiodarone (PACERONE) 200 MG tablet Take 1 tablet (200 mg total) by mouth daily. 12/19/17  Yes Lendon Colonel, NP  amLODipine (NORVASC) 2.5 MG tablet Take 2.5 mg by mouth daily.   Yes [provider]  apixaban (ELIQUIS) 5 MG TABS tablet Take 1 tablet (5 mg total) by mouth 2 (two) times daily. 07/26/17  Yes Sherran Needs, NP  furosemide (LASIX) 40 MG tablet Take 40 mg by mouth daily.   Yes [provider]  levothyroxine (SYNTHROID, LEVOTHROID) 50 MCG tablet Take 50-75 mcg by mouth See admin instructions. Take every other alternating between 31mcg and 25mcg.   Yes [provider]  metoprolol tartrate (LOPRESSOR) 25 MG tablet Take 25 mg by mouth 2 (two) times daily.   Yes [provider]  nitrofurantoin, macrocrystal-monohydrate, (MACROBID) 100 MG capsule Take 1 capsule by mouth every 12 (twelve) hours. 02/06/18 02/13/18 Yes [provider]    Inpatient Medications: Scheduled Meds: . amiodarone  200 mg Oral Daily  . apixaban  5 mg Oral BID  . furosemide  40 mg  Intravenous BID  . levothyroxine  50 mcg Oral QODAY  . levothyroxine  75 mcg Oral QODAY  . metoprolol tartrate  25 mg Oral BID  . sodium chloride flush  3 mL Intravenous Q12H   Continuous Infusions: . sodium chloride    . cefTRIAXone (ROCEPHIN) IVPB 1 gram/100 mL NS (Mini-Bag Plus) Stopped (02/08/18 0403)   PRN Meds: sodium chloride, acetaminophen **OR** acetaminophen, HYDROcodone-acetaminophen, ondansetron **OR** ondansetron (ZOFRAN) IV, sodium chloride flush  Allergies:    Allergies  Allergen Reactions  . Flagyl [Metronidazole] Hives  . Lisinopril Cough    Social History:   Social History   Socioeconomic History  . Marital status: Widowed    Spouse name: Not on file  . Number of children: Not on file  . Years of education: Not on file  . Highest education level: Not on file  Occupational History  . Occupation: Retired  Scientific laboratory technician  . Financial resource strain: Not on file  . Food insecurity:    Worry: Not on file    Inability: Not on file  . Transportation needs:    Medical: Not on file    Non-medical: Not on file  Tobacco Use  . Smoking status: Former Smoker    Years: 10.00    Types: Cigarettes    Last attempt to quit: 09/25/2005    Years since quitting: 12.3  . Smokeless tobacco: Never Used  Substance and Sexual Activity  . Alcohol use: Yes  . Drug use: No  . Sexual activity: Not on file  Lifestyle  . Physical activity:    Days per week: Not on file    Minutes per session: Not on file  . Stress: Not on file  Relationships  . Social connections:    Talks on phone: Not on file    Gets together: Not on file    Attends religious service: Not on file    Active member of club or organization: Not on file    Attends meetings of clubs or organizations: Not on file    Relationship status: Not on file  . Intimate partner violence:    Fear of current or ex partner: Not on file    Emotionally abused: Not on file    Physically abused: Not on file    Forced  sexual activity: Not on file  Other Topics Concern  . Not on file  Social History Narrative  . Not on file    Family History:    Family History  Problem Relation Age of Onset  . Breast cancer Sister   . Stomach cancer Sister   . Hernia Sister 49       HERNIA SURGERY COMPLICATIONS  . Colon cancer Neg Hx      ROS:  Please see the history of present illness.   All other ROS reviewed and negative.     Physical Exam/Data:   Vitals:   02/08/18 0029 02/08/18 0053 02/08/18 0423 02/08/18 0808  BP:   (!) 124/47 (!) 135/59  Pulse:   (!) 59 60  Resp:   (!) 22 20  Temp:   98.2 F (36.8 C) 98 F (36.7 C)  TempSrc:   Oral Oral  SpO2: 92%  90% 91%  Weight:  160 lb 4.8 oz (72.7 kg)    Height:  5\' 3"  (1.6 m)      Intake/Output Summary (Last 24 hours) at 02/08/2018 1004 Last data filed at 02/08/2018 0526 Gross per 24 hour  Intake 340 ml  Output -  Net 340 ml   Filed Weights   02/08/18 0053  Weight: 160 lb 4.8 oz (72.7 kg)   Body mass index is 28.4 kg/m.  General:  Frail, elderly female, in no acute distress HEENT: normal Lymph: no adenopathy Neck: no JVD Endocrine:  No thryomegaly Vascular: No carotid bruits; FA pulses 2+ bilaterally without bruits  Cardiac:  normal S1, S2; RRR; no murmur  Lungs:  clear to auscultation bilaterally except for basilar rales posteriorly Abd: soft, nontender, no hepatomegaly  Ext: 1+ pedal/ankle edema, L>R Musculoskeletal:  No deformities, BUE and BLE strength normal and equal Skin: warm and dry  Neuro:  CNs 2-12 intact, no focal abnormalities noted Psych:  Normal affect   EKG:  The EKG was personally reviewed and demonstrates: Sinus rhythm, 64 bpm, Consider left atrial enlargement, Abnormal R-wave progression, early transition, diffuse non-specific  T wave abnormalities  Telemetry:  Telemetry was personally reviewed and demonstrates:  Sinus rhythm upper 50's-low 60's  Relevant CV Studies:  Echocardiogram 11/14/17 Study Conclusions - Left  ventricle: The cavity size was normal. There was severe   concentric hypertrophy. Systolic function was vigorous. The   estimated ejection fraction was in the range of 65% to 70%. Wall   motion was normal; there were no regional wall motion   abnormalities. Doppler parameters are consistent with high   ventricular filling pressure. - Aortic valve: There was very mild stenosis. There was trivial   regurgitation. Peak velocity (S): 215 cm/s. Mean gradient (S): 11   mm Hg. Valve area (Vmax): 1.94 cm^2. - Mitral valve: Severely calcified annulus. Mildly thickened   leaflets . There was mild regurgitation. - Left atrium: The atrium was moderately dilated.  Cardiac monitor 07/17/2017  Intermittent Atrial fibrillation with RVR.  No significant bradycardia noted.  Laboratory Data:  Chemistry Recent Labs  Lab 02/07/18 1601 02/07/18 1610 02/08/18 0700  NA 138 138 139  K 3.7 3.7 3.9  CL 96* 95* 97*  CO2 32  --  32  GLUCOSE 100* 98 90  BUN 17 19 19   CREATININE 1.15* 1.10* 1.10*  CALCIUM 8.7*  --  8.6*  GFRNONAA 41*  --  43*  GFRAA 47*  --  50*  ANIONGAP 10  --  10    Recent Labs  Lab 02/08/18 0700  PROT 5.7*  ALBUMIN 2.9*  AST 11*  ALT 8*  ALKPHOS 53  BILITOT 0.8   Hematology Recent Labs  Lab 02/07/18 1601 02/07/18 1610 02/08/18 0143 02/08/18 0700  WBC 13.8*  --   --  10.2  RBC 3.75*  --  3.45* 3.57*  HGB 10.7* 11.6*  --  10.2*  HCT 34.8* 34.0*  --  32.8*  MCV 92.8  --   --  91.9  MCH 28.5  --   --  28.6  MCHC 30.7  --   --  31.1  RDW 14.6  --   --  14.6  PLT 227  --   --  209   Cardiac Enzymes Recent Labs  Lab 02/07/18 2051 02/08/18 0143 02/08/18 0700  TROPONINI <0.03 <0.03 <0.03    Recent Labs  Lab 02/07/18 1609  TROPIPOC 0.01    BNP Recent Labs  Lab 02/07/18 1601  BNP 1,134.6*    DDimer No results for input(s): DDIMER in the last 168 hours.  Radiology/Studies:  Dg Chest 2 View  Result Date: 02/07/2018 CLINICAL DATA:  Increasing shortness  of breath over the past few days. EXAM: CHEST - 2 VIEW COMPARISON:  Chest x-ray dated November 12, 2017. FINDINGS: Stable cardiomegaly. Slightly worsened diffuse interstitial thickening. Increased small bilateral pleural effusions and bibasilar atelectasis. No pneumothorax. No acute osseous abnormality. IMPRESSION: Slightly worsened pulmonary interstitial edema and small bilateral pleural effusions. Electronically Signed   By: Titus Dubin M.D.   On: 02/07/2018 17:24    Assessment and Plan:   Acute on chronic diastolic heart failure -Treated with metoprolol tartrate and lasix 40 mg daily at home but was recently decreased to 20 mg. -Presented with progressively worsening shortness of breath over the past week, orthopnea, PND and increased pedal edema.  -Her BNP is 1134 and chest xray showed slightly worsened pulmonary interstitial edema and small bilateral pleural effusions. Troponins have been negative X 3.  -She has received lasix 40 mg IV X 2 with improvement in breathing and edema. No UOP documented.  -  Monitor daily wts and strict I&O.  -continue current diuresis and monitor closely.  -This exacerbation may have been related to her symptomatic UTI with fatigue and recent reduction in diuretic to try and improve her fatigue.   Hypertension -Managed with lopressor 25 mg bid, amlodipine 2.5 mg, and lasix -BP is currently well controlled. Lopressor was held due to bradycardia.   Paroxysmal atrial fibrillation -Maintaining sinus rhythm with amiodarone and lopressor 25 mg bid -CHA2DS2/VAS Stroke Risk Score is 5 (CHF, HTN, age (4), female). She is anticoagulated with Eliquis 5 mg bid for stroke risk reduction -Lopressor held for heart rate in the 50's. May consider stopping metoprolol to allow for increased heart rate and may improve her fatigue.   Urinary tract infection -Pt with symptoms of UTI. WBCs 13.8. UA positive. Urine culture in process. -Being treated with Rocephin IV.    Hypothyroidism -Treated with synthroid, TSH 2.9. Recent increase in synthroid by PCP.   Electrolyte abnormalities -Given magnesium 800 mg orally and KDur 40 mEq -K+ 3.9 this am. Mag 1.9.   Chronic respiratory failure -On home oxygen 3L.   For questions or updates, please contact [place] Please consult [email or web address] for contact info under Cardiology/STEMI.   Signed, Daune Perch, NP  02/08/2018 10:04 AM

## 2018-02-08 NOTE — Plan of Care (Signed)
  Problem: Health Behavior/Discharge Planning: Goal: Ability to manage health-related needs will improve Outcome: Progressing   Problem: Nutrition: Goal: Adequate nutrition will be maintained Outcome: Progressing   Problem: Skin Integrity: Goal: Risk for impaired skin integrity will decrease Outcome: Progressing   

## 2018-02-08 NOTE — Progress Notes (Signed)
[facility]                                                                              Patient Demographics  Rebecca Norman, is a 84 y.o. female, DOB - 13-Dec-1924, HWE:[id number]  Admit date - 02/07/2018   Admitting Physician Toy Baker, MD  Outpatient Primary MD for the patient is [name], MD  Outpatient specialists:   LOS - 1  days   Medical records reviewed and are as summarized below:    Chief Complaint  Patient presents with  . Shortness of Breath       Brief summary   Patient is a 84 year old female with history of chronic diastolic CHF, hypothyroidism, chronic atrial fib on anticoagulation, anemia, chronic hypoxic respiratory failure, on 3 L at home presented to ED with shortness of breath, progressively worsening in the past 1 week.  Patient denied coughing, congestion, fevers chills or chest pain.  Reported orthopnea and PND. BNP 1134, chest x-ray showed slightly worsened pulmonary interstitial edema and small bilateral pleural effusions.  Patient was admitted for acute on chronic diastolic CHF exacerbation.  Received Lasix 40 mg IV x1 in ED  Assessment & Plan    Principal Problem:   Acute on chronic diastolic (congestive) heart failure (HCC) with underlying history of chronic hypoxic respiratory failure, diastolic CHF -Serial cardiac enzymes negative, BNP 1134 with chest x-ray showing pulmonary edema -Continue strict I's and O's and daily weights -Still feeling bit winded today, increase Lasix 40 mg IV every 12 hours, follow creatinine function closely -Recent echo on 11/14/2017 showed EF of 65 to 70%, normal wall motion -Per admitting physician, cardiology has been consulted  Active Problems:   HTN (hypertension) -BP currently stable continue beta-blocker    Hypothyroid -Continue Synthroid, TSH 2.9    Atrial fibrillation, chronic (HCC) -Heart rate controlled, continue amiodarone, beta-blocker -Continue apixaban   Normocytic anemia -H&H currently at baseline 10-11 -Anemia profile consistent with anemia of chronic disease    Acute lower UTI -Follow urine culture and sensitivities, continue IV Rocephin  Code Status: DNR DVT Prophylaxis: Apixaban Family Communication: Discussed in detail with the patient, all imaging results, lab results explained to the patient    Disposition Plan: When medically ready  Time Spent in minutes   35 minutes  Procedures:  None  Consultants:   Cardiology  Antimicrobials:   IV Rocephin   Medications  Scheduled Meds: . amiodarone  200 mg Oral Daily  . apixaban  5 mg Oral BID  . furosemide  40 mg Intravenous Daily  . levothyroxine  50 mcg Oral QODAY  . levothyroxine  75 mcg Oral QODAY  . metoprolol tartrate  25 mg Oral BID  . sodium chloride flush  3 mL Intravenous Q12H   Continuous Infusions: . sodium chloride    . cefTRIAXone (ROCEPHIN) IVPB 1 gram/100 mL NS (Mini-Bag Plus) Stopped (02/08/18 0403)   PRN Meds:.sodium chloride, acetaminophen **OR** acetaminophen, HYDROcodone-acetaminophen, ondansetron **OR** ondansetron (ZOFRAN) IV, sodium chloride flush   Antibiotics   Anti-infectives (From admission, onward)   Start     Dose/Rate Route Frequency Ordered Stop   02/07/18 2230  cefTRIAXone (ROCEPHIN)  1 g in sodium chloride 0.9 % 100 mL IVPB     1 g 200 mL/hr over 30 Minutes Intravenous Every 24 hours 02/07/18 2211          Subjective:   Rebecca Norman was seen and examined today.  Still feeling somewhat "winded" no chest, coughing or fevers or chills. Patient denies dizziness, abdominal pain, N/V/D/C, new weakness, numbess, tingling. No acute events overnight.    Objective:   Vitals:   02/08/18 0029 02/08/18 0053 02/08/18 0423 02/08/18 0808  BP:   (!) 124/47 (!) 135/59  Pulse:   (!) 59 60  Resp:   (!) 22 20  Temp:   98.2 F (36.8 C) 98 F (36.7 C)  TempSrc:   Oral Oral  SpO2: 92%  90% 91%  Weight:  72.7 kg (160 lb 4.8 oz)      Height:  5\' 3"  (1.6 m)      Intake/Output Summary (Last 24 hours) at 02/08/2018 0929 Last data filed at 02/08/2018 0526 Gross per 24 hour  Intake 340 ml  Output -  Net 340 ml     Wt Readings from Last 3 Encounters:  02/08/18 72.7 kg (160 lb 4.8 oz)  01/31/18 76 kg (167 lb 9.6 oz)  12/04/17 77.2 kg (170 lb 4.8 oz)     Exam  General: Alert and oriented x 3, NAD  Eyes:   HEENT:  Atraumatic, normocephalic, normal oropharynx  Cardiovascular: S1 S2 auscultated, Regular rate and rhythm.  Respiratory: Decreased breath sound at the bases  Gastrointestinal: Soft, nontender, nondistended, + bowel sounds  Ext: 1-2+ pedal edema bilaterally  Neuro: no new deficit  Musculoskeletal: No digital cyanosis, clubbing  Skin: No rashes  Psych: Normal affect and demeanor, alert and oriented x3    Data Reviewed:  I have personally reviewed following labs and imaging studies  Micro Results No results found for this or any previous visit (from the past 240 hour(s)).  Radiology Reports Dg Chest 2 View  Result Date: 02/07/2018 CLINICAL DATA:  Increasing shortness of breath over the past few days. EXAM: CHEST - 2 VIEW COMPARISON:  Chest x-ray dated November 12, 2017. FINDINGS: Stable cardiomegaly. Slightly worsened diffuse interstitial thickening. Increased small bilateral pleural effusions and bibasilar atelectasis. No pneumothorax. No acute osseous abnormality. IMPRESSION: Slightly worsened pulmonary interstitial edema and small bilateral pleural effusions. Electronically Signed   By: Titus Dubin M.D.   On: 02/07/2018 17:24    Lab Data:  CBC: Recent Labs  Lab 02/07/18 1601 02/07/18 1610 02/08/18 0700  WBC 13.8*  --  10.2  NEUTROABS 12.4*  --   --   HGB 10.7* 11.6* 10.2*  HCT 34.8* 34.0* 32.8*  MCV 92.8  --  91.9  PLT 227  --  062   Basic Metabolic Panel: Recent Labs  Lab 02/07/18 1601 02/07/18 1610 02/07/18 2051 02/08/18 0700  NA 138 138  --  139  K 3.7 3.7  --  3.9   CL 96* 95*  --  97*  CO2 32  --   --  32  GLUCOSE 100* 98  --  90  BUN 17 19  --  19  CREATININE 1.15* 1.10*  --  1.10*  CALCIUM 8.7*  --   --  8.6*  MG  --   --  1.9 1.9  PHOS  --   --   --  3.4   GFR: Estimated Creatinine Clearance: 32.5 mL/min (A) (by C-G formula based on SCr of 1.1 mg/dL (  H)). Liver Function Tests: Recent Labs  Lab 02/08/18 0700  AST 11*  ALT 8*  ALKPHOS 53  BILITOT 0.8  PROT 5.7*  ALBUMIN 2.9*   No results for input(s): LIPASE, AMYLASE in the last 168 hours. No results for input(s): AMMONIA in the last 168 hours. Coagulation Profile: No results for input(s): INR, PROTIME in the last 168 hours. Cardiac Enzymes: Recent Labs  Lab 02/07/18 2051 02/08/18 0143 02/08/18 0700  TROPONINI <0.03 <0.03 <0.03   BNP (last 3 results) No results for input(s): PROBNP in the last 8760 hours. HbA1C: No results for input(s): HGBA1C in the last 72 hours. CBG: No results for input(s): GLUCAP in the last 168 hours. Lipid Profile: No results for input(s): CHOL, HDL, LDLCALC, TRIG, CHOLHDL, LDLDIRECT in the last 72 hours. Thyroid Function Tests: Recent Labs    02/08/18 0143  TSH 2.958   Anemia Panel: Recent Labs    02/08/18 0143  VITAMINB12 105*  FOLATE 7.5  FERRITIN 94  TIBC 265  IRON 34  RETICCTPCT 2.6   Urine analysis:    Component Value Date/Time   COLORURINE STRAW (A) 02/07/2018 2001   APPEARANCEUR CLEAR 02/07/2018 2001   LABSPEC 1.005 02/07/2018 2001   PHURINE 8.0 02/07/2018 2001   GLUCOSEU NEGATIVE 02/07/2018 2001   HGBUR SMALL (A) 02/07/2018 2001   BILIRUBINUR NEGATIVE 02/07/2018 2001   KETONESUR NEGATIVE 02/07/2018 2001   PROTEINUR NEGATIVE 02/07/2018 2001   UROBILINOGEN 0.2 12/30/2008 1535   NITRITE NEGATIVE 02/07/2018 2001   LEUKOCYTESUR LARGE (A) 02/07/2018 2001     Haniyyah Sakuma M.D. [facility] 02/08/2018, 9:29 AM  Pager: [phone] Between 7am to 7pm - call Pager - [phone]  After 7pm go to [email or web address] - password  [email or web address]  Call night coverage person covering after 7pm

## 2018-02-09 LAB — BASIC METABOLIC PANEL
Anion gap: 11 (ref 5–15)
BUN: 26 mg/dL — AB (ref 6–20)
CHLORIDE: 100 mmol/L — AB (ref 101–111)
CO2: 28 mmol/L (ref 22–32)
CREATININE: 1.02 mg/dL — AB (ref 0.44–1.00)
Calcium: 8.6 mg/dL — ABNORMAL LOW (ref 8.9–10.3)
GFR calc Af Amer: 54 mL/min — ABNORMAL LOW (ref >60)
GFR calc non Af Amer: 47 mL/min — ABNORMAL LOW (ref >60)
Glucose, Bld: 83 mg/dL (ref 65–99)
Potassium: 3.8 mmol/L (ref 3.5–5.1)
SODIUM: 139 mmol/L (ref 135–145)

## 2018-02-09 LAB — URINE CULTURE

## 2018-02-09 MED ORDER — FUROSEMIDE 40 MG PO TABS
40.0000 mg | ORAL_TABLET | Freq: Two times a day (BID) | ORAL | Status: DC
  Administered 2018-02-09 – 2018-02-10 (×3): 40 mg via ORAL
  Filled 2018-02-09 (×3): qty 1

## 2018-02-09 MED ORDER — CYANOCOBALAMIN 1000 MCG/ML IJ SOLN
1000.0000 ug | Freq: Every day | INTRAMUSCULAR | Status: DC
  Administered 2018-02-09: 1000 ug via INTRAMUSCULAR
  Filled 2018-02-09 (×2): qty 1

## 2018-02-09 NOTE — Clinical Social Work Note (Signed)
Clinical Social Work Assessment  Patient Details  Name: Rebecca Norman MRN: [id number] Date of Birth: 05/09/1925  Date of referral:  02/09/18               Reason for consult:  Facility Placement, Discharge Planning                Permission sought to share information with:  Chartered certified accountant granted to share information::  Yes, Verbal Permission Granted  Name::        Agency::  [facility]  Relationship::     Contact Information:     Housing/Transportation Living arrangements for the past 2 months:  Single Family Home Source of Information:  Patient, Medical Team Patient Interpreter Needed:  None Criminal Activity/Legal Involvement Pertinent to Current Situation/Hospitalization:  No - Comment as needed Significant Relationships:  Adult Children Lives with:  Self Do you feel safe going back to the place where you live?  Yes Need for family participation in patient care:  Yes (Comment)  Care giving concerns:  PT recommending SNF once medically stable for discharge.   Social Worker assessment / plan:  CSW met with patient. No supports at bedside. CSW introduced role and explained that PT recommendations would be discussed. Patient is agreeable to SNF placement. First preference is [facility]. She discharged from there a little over two months ago. CSW left message for admissions coordinator to notify. No further concerns. CSW encouraged patient to contact CSW as needed. CSW will continue to follow patient for support and facilitate discharge to SNF once medically stable.  Employment status:  Retired Forensic scientist:  Medicare PT Recommendations:  [facility] / Referral to community resources:  [place]  Patient/Family's Response to care:  Patient agreeable to SNF placement. Patient's children supportive and involved in patient's care. Patient appreciated social work intervention.  Patient/Family's  Understanding of and Emotional Response to Diagnosis, Current Treatment, and Prognosis:  Patient has a good understanding of the reason for admission and her need for rehab prior to returning home. Patient appears happy with hospital care.  Emotional Assessment Appearance:  Appears stated age Attitude/Demeanor/Rapport:  Engaged, Gracious Affect (typically observed):  Accepting, Appropriate, Calm, Pleasant Orientation:  Oriented to Self, Oriented to Place, Oriented to  Time, Oriented to Situation Alcohol / Substance use:  Never Used Psych involvement (Current and /or in the community):  No (Comment)  Discharge Needs  Concerns to be addressed:  Care Coordination Readmission within the last 30 days:  No Current discharge risk:  Dependent with Mobility, Lives alone Barriers to Discharge:  Continued Medical Work up, [employer] inpatient day 2/3.)   Candie Chroman, LCSW 02/09/2018, 12:50 PM

## 2018-02-09 NOTE — Plan of Care (Signed)
  Problem: Activity: Goal: Capacity to carry out activities will improve Outcome: Not Progressing   Problem: Education: Goal: Ability to verbalize understanding of medication therapies will improve Outcome: Not Progressing   Problem: Education: Goal: Ability to demonstrate management of disease process will improve Outcome: Not Progressing

## 2018-02-09 NOTE — Discharge Instructions (Signed)

## 2018-02-09 NOTE — Progress Notes (Signed)
Progress Note  Patient Name: Rebecca Norman Date of Encounter: 02/09/2018  Primary Cardiologist: Kirk Ruths, MD   Subjective   Feeling better.  Breathing back to baseline.    Inpatient Medications    Scheduled Meds: . amiodarone  200 mg Oral Daily  . apixaban  5 mg Oral BID  . levothyroxine  50 mcg Oral QODAY  . levothyroxine  75 mcg Oral QODAY  . metoprolol tartrate  12.5 mg Oral BID  . sodium chloride flush  3 mL Intravenous Q12H   Continuous Infusions: . sodium chloride    . cefTRIAXone (ROCEPHIN) IVPB 1 gram/100 mL NS (Mini-Bag Plus) Stopped (02/08/18 2325)   PRN Meds: sodium chloride, acetaminophen **OR** acetaminophen, HYDROcodone-acetaminophen, ondansetron **OR** ondansetron (ZOFRAN) IV, sodium chloride flush   Vital Signs    Vitals:   02/08/18 2116 02/09/18 0059 02/09/18 0426 02/09/18 0809  BP: 117/73 (!) 100/35 (!) 135/58 (!) 131/52  Pulse: (!) 54 (!) 54 (!) 55 (!) 53  Resp: 17 17  18   Temp: 98.1 F (36.7 C) 98.4 F (36.9 C) 98.3 F (36.8 C) 97.6 F (36.4 C)  TempSrc: Oral Oral Oral Oral  SpO2: 94% 90% 90% 97%  Weight:   158 lb 8.2 oz (71.9 kg)   Height:        Intake/Output Summary (Last 24 hours) at 02/09/2018 1010 Last data filed at 02/09/2018 0622 Gross per 24 hour  Intake 360 ml  Output 1300 ml  Net -940 ml   Filed Weights   02/08/18 0053 02/09/18 0426  Weight: 160 lb 4.8 oz (72.7 kg) 158 lb 8.2 oz (71.9 kg)    Telemetry    Sinus bradycardia/sinus rhythm.  PACs.  Rate 50s-60s. - Personally Reviewed  ECG    Sinus bradycardia.  Rate 1 day Plan is- Personally Reviewed  Physical Exam  Friday VS:  BP (!) 131/52 (BP Location: Left Arm)   Pulse (!) 53   Temp 97.6 F (36.4 C) (Oral)   Resp 18   Ht 5\' 3"  (1.6 m)   Wt 158 lb 8.2 oz (71.9 kg)   SpO2 97%   BMI 28.08 kg/m  , BMI Body mass index is 28.08 kg/m. GENERAL:  Well appearing HEENT: Pupils equal round and reactive, fundi not visualized, oral mucosa unremarkable NECK:  No  jugular venous distention, waveform within normal limits, carotid upstroke brisk and symmetric, no bruits LUNGS:  Clear to auscultation bilaterally HEART:  RRR.  PMI not displaced or sustained,S1 and S2 within normal limits, no S3, no S4, no clicks, no rubs, II/VI systolic murmurs ABD:  Flat, positive bowel sounds normal in frequency in pitch, no bruits, no rebound, no guarding, no midline pulsatile mass, no hepatomegaly, no splenomegaly EXT:  2 plus pulses throughout, no edema, no cyanosis no clubbing SKIN:  No rashes no nodules NEURO:  Cranial nerves II through XII grossly intact, motor grossly intact throughout [facility]:  Cognitively intact, oriented to person place and time    Labs    Chemistry Recent Labs  Lab 02/07/18 1601 02/07/18 1610 02/08/18 0700 02/09/18 0646  NA 138 138 139 139  K 3.7 3.7 3.9 3.8  CL 96* 95* 97* 100*  CO2 32  --  32 28  GLUCOSE 100* 98 90 83  BUN 17 19 19  26*  CREATININE 1.15* 1.10* 1.10* 1.02*  CALCIUM 8.7*  --  8.6* 8.6*  PROT  --   --  5.7*  --   ALBUMIN  --   --  2.9*  --   AST  --   --  11*  --   ALT  --   --  8*  --   ALKPHOS  --   --  53  --   BILITOT  --   --  0.8  --   GFRNONAA 41*  --  43* 47*  GFRAA 47*  --  50* 54*  ANIONGAP 10  --  10 11     Hematology Recent Labs  Lab 02/07/18 1601 02/07/18 1610 02/08/18 0143 02/08/18 0700  WBC 13.8*  --   --  10.2  RBC 3.75*  --  3.45* 3.57*  HGB 10.7* 11.6*  --  10.2*  HCT 34.8* 34.0*  --  32.8*  MCV 92.8  --   --  91.9  MCH 28.5  --   --  28.6  MCHC 30.7  --   --  31.1  RDW 14.6  --   --  14.6  PLT 227  --   --  209    Cardiac Enzymes Recent Labs  Lab 02/07/18 2051 02/08/18 0143 02/08/18 0700  TROPONINI <0.03 <0.03 <0.03    Recent Labs  Lab 02/07/18 1609  TROPIPOC 0.01     BNP Recent Labs  Lab 02/07/18 1601  BNP 1,134.6*     DDimer No results for input(s): DDIMER in the last 168 hours.   Radiology    Dg Chest 2 View  Result Date: 02/07/2018 CLINICAL DATA:   Increasing shortness of breath over the past few days. EXAM: CHEST - 2 VIEW COMPARISON:  Chest x-ray dated November 12, 2017. FINDINGS: Stable cardiomegaly. Slightly worsened diffuse interstitial thickening. Increased small bilateral pleural effusions and bibasilar atelectasis. No pneumothorax. No acute osseous abnormality. IMPRESSION: Slightly worsened pulmonary interstitial edema and small bilateral pleural effusions. Electronically Signed   By: Titus Dubin M.D.   On: 02/07/2018 17:24    Cardiac Studies   Echo 11/14/17: Study Conclusions  - Left ventricle: The cavity size was normal. There was severe   concentric hypertrophy. Systolic function was vigorous. The   estimated ejection fraction was in the range of 65% to 70%. Wall   motion was normal; there were no regional wall motion   abnormalities. Doppler parameters are consistent with high   ventricular filling pressure. - Aortic valve: There was very mild stenosis. There was trivial   regurgitation. Peak velocity (S): 215 cm/s. Mean gradient (S): 11   mm Hg. Valve area (Vmax): 1.94 cm^2. - Mitral valve: Severely calcified annulus. Mildly thickened   leaflets . There was mild regurgitation. - Left atrium: The atrium was moderately dilated.  Patient Profile   Ms. Channing is a 40F with paroxysmal atrial fibrillation on amiodarone, COPD on home O2, and hypothyroidism here with acute on chronic diastolic heart failure and weakness in the setting of a UTI  Assessment & Plan     # Acute on chronic diastolic heart failure:  I/O not accurately recorded.  Her weight is down 2 pounds today.  Renal function is stable.  She appears to be euvolemic on exam.  She takes 40 mg of Lasix daily at home.  We will increase this to 40 mg twice daily.  # Paroxysmal atrial fibrillation:  Currently maintaining sinus rhythm on amiodarone.  Metoprolol was reduced to 12.5 mg twice daily due to bradycardia.  Continue Eliquis.  # UTI: Currently on  ceftriaxone.  Per IM.    For questions or updates, please contact [place] Please  consult [email or web address] for contact info under Cardiology/STEMI.      Signed, Skeet Latch, MD  02/09/2018, 10:10 AM

## 2018-02-09 NOTE — Clinical Social Work Note (Signed)
[facility] can take patient over the weekend if stable.  Dayton Scrape, [place]

## 2018-02-09 NOTE — NC FL2 (Signed)
[place] LEVEL OF CARE SCREENING TOOL     IDENTIFICATION  Patient Name: Rebecca Norman Birthdate: 1924-10-11 Sex: female Admission Date (Current Location): 02/07/2018  [facility] [place] and [place] Number:  Herbalist and Address:  [facility], [place] [address], [place], [place] [address]      Provider Number: [id number]  Attending Physician Name and Address:  Mendel Corning, MD  Relative Name and Phone Number:       Current Level of Care: Hospital Recommended Level of Care: [place] Prior Approval Number:    Date Approved/Denied:   PASRR Number: [id number]  Discharge Plan: SNF    Current Diagnoses: Patient Active Problem List   Diagnosis Date Noted  . Acute lower UTI 02/07/2018  . Acute on chronic diastolic (congestive) heart failure ([place]) 11/21/2017  . Paroxysmal atrial fibrillation ([place]) 11/21/2017  . AKI (acute kidney injury) ([place]) 11/15/2017  . Cardiorenal syndrome with renal failure 11/15/2017  . Acute diastolic CHF (congestive heart failure) ([place]) 11/12/2017  . Normocytic anemia 11/12/2017  . CHF (congestive heart failure) ([place]) 11/12/2017  . Fall 08/25/2017  . Closed left ankle fracture 08/25/2017  . Chronic diastolic CHF (congestive heart failure) ([place]) 08/25/2017  . Closed avulsion fracture of medial malleolus of left tibia   . Fall at home   . Current use of long term anticoagulation 08/11/2017  . Atrial fibrillation, chronic ([place]) 08/06/2017  . Hypothyroid 06/13/2017  . Palpitations 06/13/2017  . Abnormal EKG 06/13/2017  . Heart murmur 06/13/2017  . S/P laminectomy 07/10/2013  . HTN (hypertension) 07/10/2013  . BLADDER CANCER 12/17/2008  . DIVERTICULOSIS OF COLON 12/12/2008  . COLONIC POLYPS, HYPERPLASTIC, HX OF 12/12/2008    Orientation RESPIRATION BLADDER Height & Weight     Self, Time, Situation, Place  O2(Nasal Canula 3 L.) Incontinent, External catheter Weight: 158 lb 8.2 oz (71.9  kg) Height:  5\' 3"  (160 cm)  BEHAVIORAL SYMPTOMS/MOOD NEUROLOGICAL BOWEL NUTRITION STATUS  (None) (None) Continent Diet(Heart healthy)  AMBULATORY STATUS COMMUNICATION OF NEEDS Skin   Limited Assist Verbally Bruising, Other (Comment)(Skin tear.)                       Personal Care Assistance Level of Assistance  Bathing, Feeding, Dressing Bathing Assistance: Maximum assistance Feeding assistance: Independent Dressing Assistance: Maximum assistance     Functional Limitations Info  Sight, Hearing, Speech Sight Info: Adequate Hearing Info: Adequate Speech Info: Adequate    SPECIAL CARE FACTORS FREQUENCY  PT (By licensed PT), Blood pressure, OT (By licensed OT)     PT Frequency: 5 x week OT Frequency: 5 x week            Contractures Contractures Info: Not present    Additional Factors Info  Code Status, Allergies Code Status Info: DNR Allergies Info: Flagyl (Metronidazole), Lisinopril           Current Medications (02/09/2018):  This is the current hospital active medication list Current Facility-Administered Medications  Medication Dose Route Frequency Provider Last Rate Last Dose  . 0.9 %  sodium chloride infusion  250 mL Intravenous PRN Doutova, Anastassia, MD      . acetaminophen (TYLENOL) tablet 650 mg  650 mg Oral Q6H PRN Toy Baker, MD   650 mg at 02/08/18 1826   Or  . acetaminophen (TYLENOL) suppository 650 mg  650 mg Rectal Q6H PRN Doutova, Anastassia, MD      . amiodarone (PACERONE) tablet 200 mg  200 mg Oral Daily Doutova, Anastassia, MD   200 mg at 02/09/18 1028  . apixaban (ELIQUIS) tablet 5 mg  5 mg Oral BID Toy Baker, MD   5 mg at 02/09/18 1028  . cefTRIAXone (ROCEPHIN) 1 g in sodium chloride 0.9 % 100 mL IVPB  1 g Intravenous Q24H Toy Baker, MD   Stopped at 02/08/18 2325  . furosemide (LASIX) tablet 40 mg  40 mg Oral BID Skeet Latch, MD   40 mg at 02/09/18 1028  . HYDROcodone-acetaminophen (NORCO/VICODIN) 5-325  MG per tablet 1-2 tablet  1-2 tablet Oral Q4H PRN Doutova, Anastassia, MD      . levothyroxine (SYNTHROID, LEVOTHROID) tablet 50 mcg  50 mcg Oral Lianne Cure, MD   50 mcg at 02/08/18 0524  . levothyroxine (SYNTHROID, LEVOTHROID) tablet 75 mcg  75 mcg Oral Lianne Cure, MD   75 mcg at 02/08/18 0525  . metoprolol tartrate (LOPRESSOR) tablet 12.5 mg  12.5 mg Oral BID Skeet Latch, MD   12.5 mg at 02/09/18 1041  . ondansetron (ZOFRAN) tablet 4 mg  4 mg Oral Q6H PRN Toy Baker, MD       Or  . ondansetron (ZOFRAN) injection 4 mg  4 mg Intravenous Q6H PRN Doutova, Anastassia, MD      . sodium chloride flush (NS) 0.9 % injection 3 mL  3 mL Intravenous Q12H Doutova, Anastassia, MD   3 mL at 02/09/18 1031  . sodium chloride flush (NS) 0.9 % injection 3 mL  3 mL Intravenous PRN Toy Baker, MD         Discharge Medications: Please see discharge summary for a list of discharge medications.  Relevant Imaging Results:  Relevant Lab Results:   Additional Information SS#: [id number]  Candie Chroman, LCSW

## 2018-02-09 NOTE — Progress Notes (Signed)
[facility]                                                                              Patient Demographics  Rebecca Norman, is a 84 y.o. female, DOB - 06-14-1925, EXH:[id number]  Admit date - 02/07/2018   Admitting Physician Toy Baker, MD  Outpatient Primary MD for the patient is [name], MD  Outpatient specialists:   LOS - 2  days   Medical records reviewed and are as summarized below:    Chief Complaint  Patient presents with  . Shortness of Breath       Brief summary   Patient is a 84 year old female with history of chronic diastolic CHF, hypothyroidism, chronic atrial fib on anticoagulation, anemia, chronic hypoxic respiratory failure, on 3 L at home presented to ED with shortness of breath, progressively worsening in the past 1 week.  Patient denied coughing, congestion, fevers chills or chest pain.  Reported orthopnea and PND. BNP 1134, chest x-ray showed slightly worsened pulmonary interstitial edema and small bilateral pleural effusions.  Patient was admitted for acute on chronic diastolic CHF exacerbation.  Received Lasix 40 mg IV x1 in ED  Assessment & Plan    Principal Problem:   Acute on chronic diastolic (congestive) heart failure (HCC) with underlying history of chronic hypoxic respiratory failure, diastolic CHF -Serial cardiac enzymes negative, BNP 1134 with chest x-ray showing pulmonary edema -Continue strict I's and O's and daily weights -Negative balance of 620 cc, 6, creatinine stable  -Recent echo on 11/14/2017 showed EF of 65 to 70%, normal wall motion -Cardiology following, transition to oral Lasix 40 mg twice a day  Active Problems:   HTN (hypertension) -BP currently stable continue beta-blocker and Lasix    Hypothyroid -Continue Synthroid, TSH 2.9    Atrial fibrillation, chronic (HCC) -Heart rate controlled, continue amiodarone, beta-blocker -Continue apixaban    Normocytic anemia, B12 deficiency -H&H  currently at baseline 10-11 -Placed on B12 replacement, IM while inpatient, will place on oral B12 at the time of discharge    Acute lower UTI -Urine culture showed multiple species, continue IV Rocephin  Code Status: DNR DVT Prophylaxis: Apixaban Family Communication: Discussed in detail with the patient, all imaging results, lab results explained to the patient    Disposition Plan: Hopefully in the next 24 to 48 hours  Time Spent in minutes   25 minutes  Procedures:  None  Consultants:   Cardiology  Antimicrobials:   IV Rocephin   Medications  Scheduled Meds: . amiodarone  200 mg Oral Daily  . apixaban  5 mg Oral BID  . furosemide  40 mg Oral BID  . levothyroxine  50 mcg Oral QODAY  . levothyroxine  75 mcg Oral QODAY  . metoprolol tartrate  12.5 mg Oral BID  . sodium chloride flush  3 mL Intravenous Q12H   Continuous Infusions: . sodium chloride    . cefTRIAXone (ROCEPHIN) IVPB 1 gram/100 mL NS (Mini-Bag Plus) Stopped (02/08/18 2325)   PRN Meds:.sodium chloride, acetaminophen **OR** acetaminophen, HYDROcodone-acetaminophen, ondansetron **OR** ondansetron (ZOFRAN) IV, sodium chloride flush   Antibiotics   Anti-infectives (From admission, onward)   Start  Dose/Rate Route Frequency Ordered Stop   02/07/18 2230  cefTRIAXone (ROCEPHIN) 1 g in sodium chloride 0.9 % 100 mL IVPB     1 g 200 mL/hr over 30 Minutes Intravenous Every 24 hours 02/07/18 2211          Subjective:   Rebecca Norman was seen and examined today.  Somewhat better today, sitting up in the chair, denies any specific complaints.  Shortness of breath is improving, peripheral edema improving, still has generalized weakness.  Patient denies dizziness, abdominal pain, N/V/D/C, new weakness, numbess, tingling. No acute events overnight.    Objective:   Vitals:   02/09/18 1029 02/09/18 1040 02/09/18 1159 02/09/18 1203  BP:   (!) 102/41 (!) 126/42  Pulse: (!) 58 60 (!) 48   Resp:   18   Temp:    98.4 F (36.9 C)   TempSrc:   Oral   SpO2:   95%   Weight:      Height:        Intake/Output Summary (Last 24 hours) at 02/09/2018 1508 Last data filed at 02/09/2018 1336 Gross per 24 hour  Intake 840 ml  Output 1800 ml  Net -960 ml     Wt Readings from Last 3 Encounters:  02/09/18 71.9 kg (158 lb 8.2 oz)  01/31/18 76 kg (167 lb 9.6 oz)  12/04/17 77.2 kg (170 lb 4.8 oz)     Exam   General: Alert and oriented x 3, NAD  Eyes:   HEENT:  Atraumatic, normocephalic  Cardiovascular: S1 S2 auscultated,  Regular rate and rhythm.  Trace pedal edema b/l  Respiratory: Decreased breath sound at the bases with crackles  Gastrointestinal: Soft, nontender, nondistended, + bowel sounds  Ext: trace pedal edema  Neuro: no new deficit  Musculoskeletal: No digital cyanosis, clubbing  Skin: No rashes  Psych: Normal affect and demeanor, alert and oriented x3    Data Reviewed:  I have personally reviewed following labs and imaging studies  Micro Results Recent Results (from the past 240 hour(s))  Urine Culture     Status: Abnormal   Collection Time: 02/07/18  8:04 PM  Result Value Ref Range Status   Specimen Description URINE, CLEAN CATCH  Final   Special Requests   Final    NONE Performed at [facility], [address] [place], [place] [address]    Culture MULTIPLE SPECIES PRESENT, SUGGEST RECOLLECTION (A)  Final   Report Status 02/09/2018 FINAL  Final    Radiology Reports Dg Chest 2 View  Result Date: 02/07/2018 CLINICAL DATA:  Increasing shortness of breath over the past few days. EXAM: CHEST - 2 VIEW COMPARISON:  Chest x-ray dated November 12, 2017. FINDINGS: Stable cardiomegaly. Slightly worsened diffuse interstitial thickening. Increased small bilateral pleural effusions and bibasilar atelectasis. No pneumothorax. No acute osseous abnormality. IMPRESSION: Slightly worsened pulmonary interstitial edema and small bilateral pleural effusions. Electronically Signed    By: Titus Dubin M.D.   On: 02/07/2018 17:24    Lab Data:  CBC: Recent Labs  Lab 02/07/18 1601 02/07/18 1610 02/08/18 0700  WBC 13.8*  --  10.2  NEUTROABS 12.4*  --   --   HGB 10.7* 11.6* 10.2*  HCT 34.8* 34.0* 32.8*  MCV 92.8  --  91.9  PLT 227  --  540   Basic Metabolic Panel: Recent Labs  Lab 02/07/18 1601 02/07/18 1610 02/07/18 2051 02/08/18 0700 02/09/18 0646  NA 138 138  --  139 139  K 3.7 3.7  --  3.9 3.8  CL 96* 95*  --  97* 100*  CO2 32  --   --  32 28  GLUCOSE 100* 98  --  90 83  BUN 17 19  --  19 26*  CREATININE 1.15* 1.10*  --  1.10* 1.02*  CALCIUM 8.7*  --   --  8.6* 8.6*  MG  --   --  1.9 1.9  --   PHOS  --   --   --  3.4  --    GFR: Estimated Creatinine Clearance: 34.8 mL/min (A) (by C-G formula based on SCr of 1.02 mg/dL (H)). Liver Function Tests: Recent Labs  Lab 02/08/18 0700  AST 11*  ALT 8*  ALKPHOS 53  BILITOT 0.8  PROT 5.7*  ALBUMIN 2.9*   No results for input(s): LIPASE, AMYLASE in the last 168 hours. No results for input(s): AMMONIA in the last 168 hours. Coagulation Profile: No results for input(s): INR, PROTIME in the last 168 hours. Cardiac Enzymes: Recent Labs  Lab 02/07/18 2051 02/08/18 0143 02/08/18 0700  TROPONINI <0.03 <0.03 <0.03   BNP (last 3 results) No results for input(s): PROBNP in the last 8760 hours. HbA1C: No results for input(s): HGBA1C in the last 72 hours. CBG: No results for input(s): GLUCAP in the last 168 hours. Lipid Profile: No results for input(s): CHOL, HDL, LDLCALC, TRIG, CHOLHDL, LDLDIRECT in the last 72 hours. Thyroid Function Tests: Recent Labs    02/08/18 0143  TSH 2.958   Anemia Panel: Recent Labs    02/08/18 0143  VITAMINB12 105*  FOLATE 7.5  FERRITIN 94  TIBC 265  IRON 34  RETICCTPCT 2.6   Urine analysis:    Component Value Date/Time   COLORURINE STRAW (A) 02/07/2018 2001   APPEARANCEUR CLEAR 02/07/2018 2001   LABSPEC 1.005 02/07/2018 2001   PHURINE 8.0  02/07/2018 2001   GLUCOSEU NEGATIVE 02/07/2018 2001   HGBUR SMALL (A) 02/07/2018 2001   BILIRUBINUR NEGATIVE 02/07/2018 2001   KETONESUR NEGATIVE 02/07/2018 2001   PROTEINUR NEGATIVE 02/07/2018 2001   UROBILINOGEN 0.2 12/30/2008 1535   NITRITE NEGATIVE 02/07/2018 2001   LEUKOCYTESUR LARGE (A) 02/07/2018 2001     Ripudeep Rai M.D. [facility] 02/09/2018, 3:08 PM  Pager: [phone] Between 7am to 7pm - call Pager - [phone]  After 7pm go to [email or web address] - password [email or web address]  Call night coverage person covering after 7pm

## 2018-02-09 NOTE — Clinical Social Work Placement (Signed)
   CLINICAL SOCIAL WORK PLACEMENT  NOTE  Date:  02/09/2018  Patient Details  Name: Rebecca Norman MRN: [id number] Date of Birth: 09-21-1924  Clinical Social Work is seeking post-discharge placement for this patient at the [place] level of care (*CSW will initial, date and re-position this form in  chart as items are completed):  Yes   Patient/family provided with [place] Work Department's list of facilities offering this level of care within the geographic area requested by the patient (or if unable, by the patient's family).  Yes   Patient/family informed of their freedom to choose among providers that offer the needed level of care, that participate in Medicare, Medicaid or managed care program needed by the patient, have an available bed and are willing to accept the patient.  Yes   Patient/family informed of [place]'s ownership interest in [facility] and [facility], as well as of the fact that they are under no obligation to receive care at these facilities.  PASRR submitted to EDS on 02/09/18     PASRR number received on       Existing PASRR number confirmed on 02/09/18     FL2 transmitted to all facilities in geographic area requested by pt/family on 02/09/18     FL2 transmitted to all facilities within larger geographic area on       Patient informed that his/her managed care company has contracts with or will negotiate with certain facilities, including the following:            Patient/family informed of bed offers received.  Patient chooses bed at       Physician recommends and patient chooses bed at      Patient to be transferred to   on  .  Patient to be transferred to facility by       Patient family notified on   of transfer.  Name of family member notified:        PHYSICIAN Please sign FL2     Additional Comment:    _______________________________________________ Candie Chroman, LCSW 02/09/2018,  12:53 PM

## 2018-02-09 NOTE — Care Management Important Message (Signed)
Important Message  Patient Details  Name: Rebecca Norman MRN: [id number] Date of Birth: June 16, 1925   Medicare Important Message Given:  Yes    Tanuj Mullens P Ryeleigh Santore 02/09/2018, 3:30 PM

## 2018-02-10 DIAGNOSIS — N179 Acute kidney failure, unspecified: Secondary | ICD-10-CM | POA: Diagnosis not present

## 2018-02-10 DIAGNOSIS — M5031 Other cervical disc degeneration,  high cervical region: Secondary | ICD-10-CM | POA: Diagnosis not present

## 2018-02-10 DIAGNOSIS — I10 Essential (primary) hypertension: Secondary | ICD-10-CM | POA: Diagnosis not present

## 2018-02-10 DIAGNOSIS — M8580 Other specified disorders of bone density and structure, unspecified site: Secondary | ICD-10-CM | POA: Diagnosis not present

## 2018-02-10 DIAGNOSIS — M6281 Muscle weakness (generalized): Secondary | ICD-10-CM | POA: Diagnosis not present

## 2018-02-10 DIAGNOSIS — I129 Hypertensive chronic kidney disease with stage 1 through stage 4 chronic kidney disease, or unspecified chronic kidney disease: Secondary | ICD-10-CM | POA: Diagnosis not present

## 2018-02-10 DIAGNOSIS — Z7401 Bed confinement status: Secondary | ICD-10-CM | POA: Diagnosis not present

## 2018-02-10 DIAGNOSIS — R41841 Cognitive communication deficit: Secondary | ICD-10-CM | POA: Diagnosis not present

## 2018-02-10 DIAGNOSIS — I480 Paroxysmal atrial fibrillation: Secondary | ICD-10-CM | POA: Diagnosis not present

## 2018-02-10 DIAGNOSIS — E034 Atrophy of thyroid (acquired): Secondary | ICD-10-CM | POA: Diagnosis not present

## 2018-02-10 DIAGNOSIS — I5033 Acute on chronic diastolic (congestive) heart failure: Secondary | ICD-10-CM | POA: Diagnosis not present

## 2018-02-10 DIAGNOSIS — N390 Urinary tract infection, site not specified: Secondary | ICD-10-CM | POA: Diagnosis not present

## 2018-02-10 DIAGNOSIS — N189 Chronic kidney disease, unspecified: Secondary | ICD-10-CM | POA: Diagnosis not present

## 2018-02-10 DIAGNOSIS — J9611 Chronic respiratory failure with hypoxia: Secondary | ICD-10-CM | POA: Diagnosis not present

## 2018-02-10 DIAGNOSIS — E538 Deficiency of other specified B group vitamins: Secondary | ICD-10-CM | POA: Diagnosis not present

## 2018-02-10 DIAGNOSIS — I5032 Chronic diastolic (congestive) heart failure: Secondary | ICD-10-CM | POA: Diagnosis not present

## 2018-02-10 DIAGNOSIS — R2689 Other abnormalities of gait and mobility: Secondary | ICD-10-CM | POA: Diagnosis not present

## 2018-02-10 DIAGNOSIS — M2550 Pain in unspecified joint: Secondary | ICD-10-CM | POA: Diagnosis not present

## 2018-02-10 DIAGNOSIS — I482 Chronic atrial fibrillation: Secondary | ICD-10-CM | POA: Diagnosis not present

## 2018-02-10 DIAGNOSIS — I5031 Acute diastolic (congestive) heart failure: Secondary | ICD-10-CM | POA: Diagnosis not present

## 2018-02-10 DIAGNOSIS — D649 Anemia, unspecified: Secondary | ICD-10-CM | POA: Diagnosis not present

## 2018-02-10 DIAGNOSIS — I130 Hypertensive heart and chronic kidney disease with heart failure and stage 1 through stage 4 chronic kidney disease, or unspecified chronic kidney disease: Secondary | ICD-10-CM | POA: Diagnosis not present

## 2018-02-10 DIAGNOSIS — E039 Hypothyroidism, unspecified: Secondary | ICD-10-CM | POA: Diagnosis not present

## 2018-02-10 DIAGNOSIS — M1990 Unspecified osteoarthritis, unspecified site: Secondary | ICD-10-CM | POA: Diagnosis not present

## 2018-02-10 LAB — BASIC METABOLIC PANEL
ANION GAP: 8 (ref 5–15)
BUN: 24 mg/dL — ABNORMAL HIGH (ref 6–20)
CHLORIDE: 97 mmol/L — AB (ref 101–111)
CO2: 32 mmol/L (ref 22–32)
Calcium: 8.5 mg/dL — ABNORMAL LOW (ref 8.9–10.3)
Creatinine, Ser: 1 mg/dL (ref 0.44–1.00)
GFR calc non Af Amer: 48 mL/min — ABNORMAL LOW (ref >60)
GFR, EST AFRICAN AMERICAN: 56 mL/min — AB (ref >60)
Glucose, Bld: 90 mg/dL (ref 65–99)
POTASSIUM: 3.9 mmol/L (ref 3.5–5.1)
SODIUM: 137 mmol/L (ref 135–145)

## 2018-02-10 MED ORDER — FUROSEMIDE 20 MG PO TABS: ORAL_TABLET | ORAL | Status: DC

## 2018-02-10 MED ORDER — VITAMIN B-12 1000 MCG PO TABS: 1000.0000 ug | ORAL_TABLET | Freq: Every day | ORAL | Status: DC

## 2018-02-10 MED ORDER — FUROSEMIDE 40 MG PO TABS: 40.0000 mg | ORAL_TABLET | Freq: Two times a day (BID) | ORAL | Status: DC

## 2018-02-10 MED ORDER — METOPROLOL TARTRATE 25 MG PO TABS: 12.5000 mg | ORAL_TABLET | Freq: Two times a day (BID) | ORAL | Status: DC

## 2018-02-10 NOTE — Clinical Social Work Placement (Addendum)
   CLINICAL SOCIAL WORK PLACEMENT  NOTE [place] [place] RN to call report to [phone]  Date:  02/10/2018  Patient Details  Name: Rebecca Norman MRN: [id number] Date of Birth: 09/19/1924  Clinical Social Work is seeking post-discharge placement for this patient at the [place] level of care (*CSW will initial, date and re-position this form in  chart as items are completed):  Yes   Patient/family provided with [place] Work Department's list of facilities offering this level of care within the geographic area requested by the patient (or if unable, by the patient's family).  Yes   Patient/family informed of their freedom to choose among providers that offer the needed level of care, that participate in Medicare, Medicaid or managed care program needed by the patient, have an available bed and are willing to accept the patient.  Yes   Patient/family informed of [place]'s ownership interest in [facility] and [facility], as well as of the fact that they are under no obligation to receive care at these facilities.  PASRR submitted to EDS on 02/09/18     PASRR number received on       Existing PASRR number confirmed on 02/09/18     FL2 transmitted to all facilities in geographic area requested by pt/family on 02/09/18     FL2 transmitted to all facilities within larger geographic area on       Patient informed that his/her managed care company has contracts with or will negotiate with certain facilities, including the following:        Yes   Patient/family informed of bed offers received.  Patient chooses bed at [facility]     Physician recommends and patient chooses bed at      Patient to be transferred to [facility] on 02/10/18.  Patient to be transferred to facility by [facility]     Patient family notified on 02/10/18 of transfer.  Name of family member notified:   pt will call daughter      PHYSICIAN       Additional Comment:    _______________________________________________ Alexander Mt, [place] 02/10/2018, 11:44 AM

## 2018-02-10 NOTE — Discharge Summary (Addendum)
Physician Discharge Summary   Patient ID: Rebecca Norman MRN: [id number] DOB/AGE: [birth year] 82 y.o.  Admit date: 02/07/2018 Discharge date: 02/10/2018  Primary Care Physician:  [name], MD   Recommendations for Outpatient Follow-up:  1. Follow up with PCP in 1-2 weeks 2. Please obtain BMP in one week, follow creatinine, potassium  Home Health: Patient being discharged to skilled nursing facility Equipment/Devices: None  Discharge Condition: Stable CODE STATUS:  DNR   Diet recommendation: Heart healthy diet   Discharge Diagnoses:    . Acute on chronic diastolic (congestive) heart failure ([place]) . Atrial fibrillation, chronic ([place]) . HTN (hypertension) . Hypothyroid . Normocytic anemia . Acute lower UTI B12 deficiency  Consults: Cardiology    Allergies:   Allergies  Allergen Reactions  . Flagyl [Metronidazole] Hives  . Lisinopril Cough     DISCHARGE MEDICATIONS: Allergies as of 02/10/2018      Reactions   Flagyl [metronidazole] Hives   Lisinopril Cough      Medication List    STOP taking these medications   amLODipine 2.5 MG tablet Commonly known as:  NORVASC     TAKE these medications   acetaminophen 500 MG tablet Commonly known as:  TYLENOL Take 500 mg by mouth every 6 (six) hours as needed for headache (pain).   amiodarone 200 MG tablet Commonly known as:  PACERONE Take 1 tablet (200 mg total) by mouth daily.   apixaban 5 MG Tabs tablet Commonly known as:  ELIQUIS Take 1 tablet (5 mg total) by mouth 2 (two) times daily.   furosemide 20 MG tablet Commonly known as:  LASIX Take 40 mg (2 tabs) in a.m. And take 20mg  (1 tab) in the afternoon What changed:    medication strength  how much to take  how to take this  when to take this  additional instructions   levothyroxine 50 MCG tablet Commonly known as:  SYNTHROID, LEVOTHROID Take 50-75 mcg by mouth See admin instructions. Take every other alternating between 85mcg and  51mcg.   metoprolol tartrate 25 MG tablet Commonly known as:  LOPRESSOR Take 0.5 tablets (12.5 mg total) by mouth 2 (two) times daily. What changed:  how much to take   nitrofurantoin (macrocrystal-monohydrate) 100 MG capsule Commonly known as:  MACROBID Take 1 capsule by mouth every 12 (twelve) hours.   vitamin B-12 1000 MCG tablet Commonly known as:  CYANOCOBALAMIN Take 1 tablet (1,000 mcg total) by mouth daily.        Brief H and P: For complete details please refer to admission H and P, but in brief Patient is a 84 year old female with history of chronic diastolic CHF, hypothyroidism, chronic atrial fib on anticoagulation, anemia, chronic hypoxic respiratory failure, on 3 L at home presented to ED with shortness of breath, progressively worsening in the past 1 week.  Patient denied coughing, congestion, fevers chills or chest pain.  Reported orthopnea and PND. BNP 1134, chest x-ray showed slightly worsened pulmonary interstitial edema and small bilateral pleural effusions.  Patient was admitted for acute on chronic diastolic CHF exacerbation.  Received Lasix 40 mg IV x1 in ED    Hospital Course:   Acute on chronic diastolic (congestive) heart failure (HCC) with underlying history of chronic hypoxic respiratory failure, diastolic CHF -Serial cardiac enzymes negative, BNP 1134 with chest x-ray showing pulmonary edema on admission.  Cardiology was consulted. -Negative balance of 1.18 L creatinine stable  -Recent echo on 11/14/2017 showed EF of 65 to 70%, normal wall motion -Initially placed  on IV Lasix for diuresis, has been transitioned to oral Lasix, 40 mg in a.m., 20 mg in the afternoon. - Follow outpatient with cardiology.    Essential HTN (hypertension) -BP currently stable continue beta-blocker and Lasix    Hypothyroidism -Continue Synthroid, TSH 2.9    Atrial fibrillation, chronic (HCC) -Heart rate controlled, continue amiodarone, beta-blocker -Continue apixaban     Normocytic anemia, B12 deficiency -H&H currently at baseline 10-11.  B12 105 -Patient was placed on placed on B12 replacement, IM while inpatient Transition to oral B12 1000 mcg daily, follow B12 levels in 4 weeks    Acute lower UTI -Urine culture showed multiple species,  patient received IV Rocephin x3 doses, does not need any further antibiotics    Day of Discharge S: No complaints, feeling okay, no chest pain or shortness of breath.  BP 100/60   Pulse 64   Temp 98.3 F (36.8 C) (Oral)   Resp 18   Ht 5\' 3"  (1.6 m)   Wt 71.9 kg (158 lb 8 oz)   SpO2 90%   BMI 28.08 kg/m   Physical Exam: General: Alert and awake oriented x3 not in any acute distress. HEENT: anicteric sclera, pupils reactive to light and accommodation CVS: S1-S2 clear no murmur rubs or gallops Chest: clear to auscultation bilaterally, no wheezing rales or rhonchi Abdomen: soft nontender, nondistended, normal bowel sounds Extremities: no cyanosis, clubbing or edema noted bilaterally Neuro: no new deficit   The results of significant diagnostics from this hospitalization (including imaging, microbiology, ancillary and laboratory) are listed below for reference.      Procedures/Studies:  Dg Chest 2 View  Result Date: 02/07/2018 CLINICAL DATA:  Increasing shortness of breath over the past few days. EXAM: CHEST - 2 VIEW COMPARISON:  Chest x-ray dated November 12, 2017. FINDINGS: Stable cardiomegaly. Slightly worsened diffuse interstitial thickening. Increased small bilateral pleural effusions and bibasilar atelectasis. No pneumothorax. No acute osseous abnormality. IMPRESSION: Slightly worsened pulmonary interstitial edema and small bilateral pleural effusions. Electronically Signed   By: Titus Dubin M.D.   On: 02/07/2018 17:24      LAB RESULTS: Basic Metabolic Panel: Recent Labs  Lab 02/08/18 0700 02/09/18 0646 02/10/18 0712  NA 139 139 137  K 3.9 3.8 3.9  CL 97* 100* 97*  CO2 32 28 32  GLUCOSE  90 83 90  BUN 19 26* 24*  CREATININE 1.10* 1.02* 1.00  CALCIUM 8.6* 8.6* 8.5*  MG 1.9  --   --   PHOS 3.4  --   --    Liver Function Tests: Recent Labs  Lab 02/08/18 0700  AST 11*  ALT 8*  ALKPHOS 53  BILITOT 0.8  PROT 5.7*  ALBUMIN 2.9*   No results for input(s): LIPASE, AMYLASE in the last 168 hours. No results for input(s): AMMONIA in the last 168 hours. CBC: Recent Labs  Lab 02/07/18 1601 02/07/18 1610 02/08/18 0700  WBC 13.8*  --  10.2  NEUTROABS 12.4*  --   --   HGB 10.7* 11.6* 10.2*  HCT 34.8* 34.0* 32.8*  MCV 92.8  --  91.9  PLT 227  --  209   Cardiac Enzymes: Recent Labs  Lab 02/08/18 0143 02/08/18 0700  TROPONINI <0.03 <0.03   BNP: Invalid input(s): POCBNP CBG: No results for input(s): GLUCAP in the last 168 hours.    Disposition and Follow-up: Discharge Instructions    (HEART FAILURE PATIENTS) Call MD:  Anytime you have any of the following symptoms: 1) 3 pound weight  gain in 24 hours or 5 pounds in 1 week 2) shortness of breath, with or without a dry hacking cough 3) swelling in the hands, feet or stomach 4) if you have to sleep on extra pillows at night in order to breathe.   Complete by:  As directed    Diet - low sodium heart healthy   Complete by:  As directed    Increase activity slowly   Complete by:  As directed        DISPOSITION SNF   DISCHARGE FOLLOW-UP  Contact information for follow-up providers    [name], MD. Schedule an appointment as soon as possible for a visit in 2 week(s).   Specialty:  Family Medicine Contact information: [address] [place] [address] [phone]        Lelon Perla, MD. Schedule an appointment as soon as possible for a visit in 2 week(s).   Specialty:  Cardiology Contact information: [address] [place] [address] [phone]            Contact information for after-discharge care    Destination    [facility] .   Service:  Skilled Nursing Contact information: [address] [place] [phone]                   Time coordinating discharge:  43mins   Signed:   Estill Cotta M.D. [facility] 02/10/2018, 11:37 AM Pager: [phone]  Coding query addendum  Patient donot have cardiorenal syndrome. Patient did not have chronic kidney disease.    Estill Cotta M.D. [facility] 02/21/2018, 7:20 PM  Pager: [phone]

## 2018-02-10 NOTE — Progress Notes (Signed)
Pt is being discharged to [facility], IV taken off, tele Dc, pt is all dressed up and waiting for an ambulance now, called report for receiving RN over [facility], [facility] is set up for 1 pm  Palma Holter, Therapist, sports

## 2018-02-10 NOTE — Progress Notes (Signed)
Progress Note  Patient Name: Rebecca Norman Date of Encounter: 02/10/2018  Primary Cardiologist: Kirk Ruths, MD   Subjective   Feeling better.  Breathing back to baseline.    Inpatient Medications    Scheduled Meds: . amiodarone  200 mg Oral Daily  . apixaban  5 mg Oral BID  . cyanocobalamin  1,000 mcg Intramuscular Daily  . furosemide  40 mg Oral BID  . levothyroxine  50 mcg Oral QODAY  . levothyroxine  75 mcg Oral QODAY  . metoprolol tartrate  12.5 mg Oral BID  . sodium chloride flush  3 mL Intravenous Q12H   Continuous Infusions: . sodium chloride    . cefTRIAXone (ROCEPHIN) IVPB 1 gram/100 mL NS (Mini-Bag Plus) Stopped (02/09/18 2234)   PRN Meds: sodium chloride, acetaminophen **OR** acetaminophen, HYDROcodone-acetaminophen, ondansetron **OR** ondansetron (ZOFRAN) IV, sodium chloride flush   Vital Signs    Vitals:   02/09/18 1729 02/09/18 2136 02/10/18 0415 02/10/18 0850  BP: (!) 126/111 (!) 109/48 (!) 97/41 100/60  Pulse: (!) 51 (!) 58 (!) 56 64  Resp: 18 18    Temp: 98.6 F (37 C) 97.8 F (36.6 C) 98.3 F (36.8 C)   TempSrc: Oral Oral Oral   SpO2: 93% 95% 90%   Weight:   158 lb 8 oz (71.9 kg)   Height:        Intake/Output Summary (Last 24 hours) at 02/10/2018 1114 Last data filed at 02/10/2018 0900 Gross per 24 hour  Intake 680 ml  Output 1500 ml  Net -820 ml   Filed Weights   02/08/18 0053 02/09/18 0426 02/10/18 0415  Weight: 160 lb 4.8 oz (72.7 kg) 158 lb 8.2 oz (71.9 kg) 158 lb 8 oz (71.9 kg)    Telemetry    Sinus bradycardia/sinus rhythm.  PACs.  Rate 50s-60s. - Personally Reviewed  ECG    Sinus bradycardia.  Rate 1 day Plan is- Personally Reviewed  Physical Exam  Friday VS:  BP 100/60   Pulse 64   Temp 98.3 F (36.8 C) (Oral)   Resp 18   Ht 5\' 3"  (1.6 m)   Wt 158 lb 8 oz (71.9 kg)   SpO2 90%   BMI 28.08 kg/m  , BMI Body mass index is 28.08 kg/m. GENERAL:  Well appearing HEENT: Pupils equal round and reactive, fundi not  visualized, oral mucosa unremarkable NECK:  No jugular venous distention, waveform within normal limits, carotid upstroke brisk and symmetric, no bruits LUNGS:  Clear to auscultation bilaterally HEART:  RRR.  PMI not displaced or sustained,S1 and S2 within normal limits, no S3, no S4, no clicks, no rubs, II/VI systolic murmurs ABD:  Flat, positive bowel sounds normal in frequency in pitch, no bruits, no rebound, no guarding, no midline pulsatile mass, no hepatomegaly, no splenomegaly EXT:  2 plus pulses throughout, no edema, no cyanosis no clubbing SKIN:  No rashes no nodules NEURO:  Cranial nerves II through XII grossly intact, motor grossly intact throughout [facility]:  Cognitively intact, oriented to person place and time    Labs    Chemistry Recent Labs  Lab 02/08/18 0700 02/09/18 0646 02/10/18 0712  NA 139 139 137  K 3.9 3.8 3.9  CL 97* 100* 97*  CO2 32 28 32  GLUCOSE 90 83 90  BUN 19 26* 24*  CREATININE 1.10* 1.02* 1.00  CALCIUM 8.6* 8.6* 8.5*  PROT 5.7*  --   --   ALBUMIN 2.9*  --   --   AST  11*  --   --   ALT 8*  --   --   ALKPHOS 53  --   --   BILITOT 0.8  --   --   GFRNONAA 43* 47* 48*  GFRAA 50* 54* 56*  ANIONGAP 10 11 8      Hematology Recent Labs  Lab 02/07/18 1601 02/07/18 1610 02/08/18 0143 02/08/18 0700  WBC 13.8*  --   --  10.2  RBC 3.75*  --  3.45* 3.57*  HGB 10.7* 11.6*  --  10.2*  HCT 34.8* 34.0*  --  32.8*  MCV 92.8  --   --  91.9  MCH 28.5  --   --  28.6  MCHC 30.7  --   --  31.1  RDW 14.6  --   --  14.6  PLT 227  --   --  209    Cardiac Enzymes Recent Labs  Lab 02/07/18 2051 02/08/18 0143 02/08/18 0700  TROPONINI <0.03 <0.03 <0.03    Recent Labs  Lab 02/07/18 1609  TROPIPOC 0.01     BNP Recent Labs  Lab 02/07/18 1601  BNP 1,134.6*     DDimer No results for input(s): DDIMER in the last 168 hours.   Radiology    No results found.  Cardiac Studies   Echo 11/14/17: Study Conclusions  - Left ventricle: The cavity  size was normal. There was severe   concentric hypertrophy. Systolic function was vigorous. The   estimated ejection fraction was in the range of 65% to 70%. Wall   motion was normal; there were no regional wall motion   abnormalities. Doppler parameters are consistent with high   ventricular filling pressure. - Aortic valve: There was very mild stenosis. There was trivial   regurgitation. Peak velocity (S): 215 cm/s. Mean gradient (S): 11   mm Hg. Valve area (Vmax): 1.94 cm^2. - Mitral valve: Severely calcified annulus. Mildly thickened   leaflets . There was mild regurgitation. - Left atrium: The atrium was moderately dilated.  Patient Profile   Rebecca Norman is a 21F with paroxysmal atrial fibrillation on amiodarone, COPD on home O2, and hypothyroidism here with acute on chronic diastolic heart failure and weakness in the setting of a UTI  Assessment & Plan    # Acute on chronic diastolic heart failure:  Her weight is down 2 pounds today.  Renal function is stable.  She appears to be euvolemic on exam.   She can be discharged on Lasix 40 mg po in the am and 20 mg po in the pm.  # Paroxysmal atrial fibrillation:  Currently maintaining sinus rhythm on amiodarone.  Metoprolol was reduced to 12.5 mg twice daily due to bradycardia.  Continue Eliquis.  # UTI: Currently on ceftriaxone.  Per IM.   She can go home, we will arrange for an outpatient follow up.   For questions or updates, please contact [place] Please consult [email or web address] for contact info under Cardiology/STEMI.   Signed, Ena Dawley, MD  02/10/2018, 11:14 AM

## 2018-02-10 NOTE — Progress Notes (Signed)
[place] [facility] [place] via [place] (ambulance), with all of her belongings in a stable condition  Palma Holter, Therapist, sports

## 2018-02-10 NOTE — Social Work (Signed)
Clinical Social Worker facilitated patient discharge including contacting patient family and facility to confirm patient discharge plans.  Clinical information faxed to facility and family agreeable with plan.  CSW arranged ambulance transport via [facility] to [facility]. [facility] requested for 1pm pick up.   RN to call [phone] with report prior to discharge.  Clinical Social Worker will sign off for now as social work intervention is no longer needed. Please consult [place] again if new need arises.  Alexander Mt, [place] Social Worker [phone] (weekend phone) [phone] (assigned phone)

## 2018-02-12 ENCOUNTER — Encounter: Payer: Self-pay | Admitting: Internal Medicine

## 2018-02-12 ENCOUNTER — Non-Acute Institutional Stay (SKILLED_NURSING_FACILITY): Payer: Medicare Other | Admitting: Internal Medicine

## 2018-02-12 DIAGNOSIS — D649 Anemia, unspecified: Secondary | ICD-10-CM | POA: Diagnosis not present

## 2018-02-12 DIAGNOSIS — E034 Atrophy of thyroid (acquired): Secondary | ICD-10-CM

## 2018-02-12 DIAGNOSIS — E538 Deficiency of other specified B group vitamins: Secondary | ICD-10-CM | POA: Diagnosis not present

## 2018-02-12 DIAGNOSIS — I482 Chronic atrial fibrillation, unspecified: Secondary | ICD-10-CM

## 2018-02-12 DIAGNOSIS — I5033 Acute on chronic diastolic (congestive) heart failure: Secondary | ICD-10-CM

## 2018-02-12 DIAGNOSIS — I10 Essential (primary) hypertension: Secondary | ICD-10-CM | POA: Diagnosis not present

## 2018-02-12 DIAGNOSIS — J9611 Chronic respiratory failure with hypoxia: Secondary | ICD-10-CM | POA: Diagnosis not present

## 2018-02-12 NOTE — Progress Notes (Signed)
:  Location:  [facility] Room Number: 400-Q Place of Service:  SNF [phone])   Rebecca Duos, MD  PCP: [name], MD Patient Care Team: [name], MD as PCP - General (Family Medicine) [name] Rebecca Bors, MD as PCP - Cardiology (Cardiology)  Extended Emergency Contact Information Primary Emergency Contact: Rebecca, Norman Mobile Phone: [phone] Relation: Son Secondary Emergency Contact: Rebecca Norman States of [place] Phone: [phone] Mobile Phone: [phone] Relation: Daughter     Allergies: Flagyl [metronidazole] and Lisinopril  Chief Complaint  Patient presents with  . New Admit To SNF    Admit to [facility]    HPI: Patient is 84 y.o. female with chronic diastolic congestive heart failure, hypothyroidism, chronic atrial fibrillation on anticoagulation, anemia, chronic hypoxic respiratory failure on 3 L at home, who presented to the ED with shortness of breath, progressively worsening over the past week.  Patient denied coughing congestion fever chills or chest pain.  There was reported orthopnea and PND.  In the ED BNP was 1134 chest x-ray shows slightly worsening pulmonary interstitial edema and small bilateral pleural effusions.  Patient was admitted to [facility] from 6/5- 8 where she was treated for acute on chronic diastolic congestive heart failure exacerbation.  She was treated with IV Lasix for diuresis then transition to oral Lasix.  Hospital course was unremarkable and all chronic medical problems were stable.  Patient is admitted to skilled nursing facility for OT/PT.  While at skilled nursing facility patient will be followed for hypertension treated with Lasix and metoprolol, chronic atrial fibrillation treated with amiodarone and metoprolol and prophylaxed with Eliquis and hypothyroidism treated with Synthroid.  Past Medical History:  Diagnosis Date  . DDD (degenerative disc disease),  cervical    severe/  auto fusion c2-c5  . Diverticulosis   . Hemorrhoids   . History of adenomatous polyp of colon    1995  adenomatous polyp/  1997 & 2003  hyperplastic polyp's  . History of bladder cancer urologist-  dr [name]   s/p  TURBT's  . History of cancer of ureter   . Hypertension   . Hypothyroid   . Nocturia   . Osteoarthritis   . Osteopenia     Past Surgical History:  Procedure Laterality Date  . BACK SURGERY    . CARDIOVERSION N/A 08/09/2017   Procedure: CARDIOVERSION;  Surgeon: Larey Dresser, MD;  Location: [facility] ENDOSCOPY;  Service: Cardiovascular;  Laterality: N/A;  . CATARACT EXTRACTION W/ INTRAOCULAR LENS  IMPLANT, BILATERAL Bilateral   . CYSTOSCOPY W/ RETROGRADES  10/03/2011   Procedure: CYSTOSCOPY WITH RETROGRADE PYELOGRAM;  Surgeon: Molli Hazard, MD;  Location: [facility];  Service: Urology;;  . Consuela Mimes W/ RETROGRADES Bilateral 06/17/2016   Procedure: CYSTOSCOPY WITH RETROGRADE PYELOGRAM;  Surgeon: Alexis Frock, MD;  Location: [facility];  Service: Urology;  Laterality: Bilateral;  . CYSTOSCOPY WITH BIOPSY N/A 06/17/2016   Procedure: CYSTOSCOPY WITH BIOPSY AND FULGERATION;  Surgeon: Alexis Frock, MD;  Location: [facility];  Service: Urology;  Laterality: N/A;  . LAMINECTOMY  2009   L3 - 4  . ORIF ANKLE FRACTURE Left 08/28/2017   Procedure: OPEN REDUCTION INTERNAL FIXATION (ORIF) ANKLE FRACTURE;  Surgeon: Shona Needles, MD;  Location: [facility] ORS;  Service: Orthopedics;  Laterality: Left;  . TRANSURETHRAL RESECTION OF BLADDER  x3 --  2009; 2010; 06-29-2010  . TRANSURETHRAL RESECTION OF BLADDER TUMOR  10/03/2011   Procedure: TRANSURETHRAL  RESECTION OF BLADDER TUMOR (TURBT);  Surgeon: Molli Hazard, MD;  Location: [facility];  Service: Urology;  Laterality: N/A;    Allergies as of 02/12/2018      Reactions   Flagyl [metronidazole] Hives   Lisinopril Cough      Medication List          Accurate as of 02/12/18 10:30 AM. Always use your most recent med list.          acetaminophen 500 MG tablet Commonly known as:  TYLENOL Take 500 mg by mouth every 6 (six) hours as needed for headache (pain).   amiodarone 200 MG tablet Commonly known as:  PACERONE Take 1 tablet (200 mg total) by mouth daily.   apixaban 5 MG Tabs tablet Commonly known as:  ELIQUIS Take 1 tablet (5 mg total) by mouth 2 (two) times daily.   furosemide 20 MG tablet Commonly known as:  LASIX Take 40 mg (2 tabs) in a.m. And take 20mg  (1 tab) in the afternoon   levothyroxine 50 MCG tablet Commonly known as:  SYNTHROID, LEVOTHROID Take 50-75 mcg by mouth See admin instructions. Take every other alternating between 79mcg and 21mcg.   metoprolol tartrate 25 MG tablet Commonly known as:  LOPRESSOR Take 0.5 tablets (12.5 mg total) by mouth 2 (two) times daily.   nitrofurantoin (macrocrystal-monohydrate) 100 MG capsule Commonly known as:  MACROBID Take 1 capsule by mouth every 12 (twelve) hours.   vitamin B-12 1000 MCG tablet Commonly known as:  CYANOCOBALAMIN Take 1 tablet (1,000 mcg total) by mouth daily.       No orders of the defined types were placed in this encounter.    There is no immunization history on file for this patient.  Social History   Tobacco Use  . Smoking status: Former Smoker    Years: 10.00    Types: Cigarettes    Last attempt to quit: 09/25/2005    Years since quitting: 12.3  . Smokeless tobacco: Never Used  Substance Use Topics  . Alcohol use: Yes    Family history is   Family History  Problem Relation Age of Onset  . Breast cancer Sister   . Stomach cancer Sister   . Hernia Sister 74       HERNIA SURGERY COMPLICATIONS  . Colon cancer Neg Hx       Review of Systems  DATA OBTAINED: from patient, nurse GENERAL:  no fevers, fatigue, appetite changes SKIN: No itching, or rash EYES: No eye pain, redness, discharge EARS: No earache, tinnitus,  change in hearing NOSE: No congestion, drainage or bleeding  MOUTH/THROAT: No mouth or tooth pain, No sore throat RESPIRATORY: No cough, wheezing, SOB CARDIAC: No chest pain, palpitations, lower extremity edema  GI: No abdominal pain, No N/V/D or constipation, No heartburn or reflux  GU: No dysuria, frequency or urgency, or incontinence  MUSCULOSKELETAL: No unrelieved bone/joint pain NEUROLOGIC: No headache, dizziness or focal weakness PSYCHIATRIC: No c/o anxiety or sadness   Vitals:   02/12/18 1024  BP: (!) 130/57  Pulse: (!) 56  Resp: 18  Temp: (!) 96.6 F (35.9 C)  SpO2: 97%    SpO2 Readings from Last 1 Encounters:  02/12/18 97%   Body mass index is 27.21 kg/m.     Physical Exam  GENERAL APPEARANCE: Alert, conversant,  No acute distress.  SKIN: No diaphoresis rash HEAD: Normocephalic, atraumatic  EYES: Conjunctiva/lids clear. Pupils round, reactive. EOMs intact.  EARS: External exam WNL, canals clear. Hearing  grossly normal.  NOSE: No deformity or discharge.  MOUTH/THROAT: Lips w/o lesions  RESPIRATORY: Breathing is even, unlabored. Lung sounds are clear   CARDIOVASCULAR: Heart RRR 1/6 murmur, no rubs or gallops. trace peripheral edema, L>R  GASTROINTESTINAL: Abdomen is soft, non-tender, not distended w/ normal bowel sounds. GENITOURINARY: Bladder non tender, not distended  MUSCULOSKELETAL: No abnormal joints or musculature NEUROLOGIC:  Cranial nerves 2-12 grossly intact. Moves all extremities  PSYCHIATRIC: Mood and affect appropriate to situation, no behavioral issues  Patient Active Problem List   Diagnosis Date Noted  . Acute lower UTI 02/07/2018  . Acute on chronic diastolic (congestive) heart failure ([place]) 11/21/2017  . Paroxysmal atrial fibrillation ([place]) 11/21/2017  . AKI (acute kidney injury) ([place]) 11/15/2017  . Cardiorenal syndrome with renal failure 11/15/2017  . Acute diastolic CHF (congestive heart failure) ([place]) 11/12/2017  . Normocytic anemia  11/12/2017  . CHF (congestive heart failure) ([place]) 11/12/2017  . Fall 08/25/2017  . Closed left ankle fracture 08/25/2017  . Chronic diastolic CHF (congestive heart failure) ([place]) 08/25/2017  . Closed avulsion fracture of medial malleolus of left tibia   . Fall at home   . Current use of long term anticoagulation 08/11/2017  . Atrial fibrillation, chronic ([place]) 08/06/2017  . Hypothyroid 06/13/2017  . Palpitations 06/13/2017  . Abnormal EKG 06/13/2017  . Heart murmur 06/13/2017  . S/P laminectomy 07/10/2013  . HTN (hypertension) 07/10/2013  . BLADDER CANCER 12/17/2008  . DIVERTICULOSIS OF COLON 12/12/2008  . COLONIC POLYPS, HYPERPLASTIC, HX OF 12/12/2008      Labs reviewed: Basic Metabolic Panel:    Component Value Date/Time   NA 137 02/10/2018 0712   NA 139 11/28/2017 1045   K 3.9 02/10/2018 0712   CL 97 (L) 02/10/2018 0712   CO2 32 02/10/2018 0712   GLUCOSE 90 02/10/2018 0712   BUN 24 (H) 02/10/2018 0712   BUN 19 11/28/2017 1045   CREATININE 1.00 02/10/2018 0712   CALCIUM 8.5 (L) 02/10/2018 0712   PROT 5.7 (L) 02/08/2018 0700   ALBUMIN 2.9 (L) 02/08/2018 0700   AST 11 (L) 02/08/2018 0700   ALT 8 (L) 02/08/2018 0700   ALKPHOS 53 02/08/2018 0700   BILITOT 0.8 02/08/2018 0700   GFRNONAA 48 (L) 02/10/2018 0712   GFRAA 56 (L) 02/10/2018 0712    Recent Labs    11/14/17 0507  02/07/18 2051 02/08/18 0700 02/09/18 0646 02/10/18 0712  NA 139   < >  --  139 139 137  K 3.8   < >  --  3.9 3.8 3.9  CL 101   < >  --  97* 100* 97*  CO2 29   < >  --  32 28 32  GLUCOSE 91   < >  --  90 83 90  BUN 25*   < >  --  19 26* 24*  CREATININE 0.91   < >  --  1.10* 1.02* 1.00  CALCIUM 8.6*   < >  --  8.6* 8.6* 8.5*  MG 2.0  --  1.9 1.9  --   --   PHOS  --   --   --  3.4  --   --    < > = values in this interval not displayed.   Liver Function Tests: Recent Labs    11/12/17 1747 11/13/17 0528 02/08/18 0700  AST 16 15 11*  ALT 21 12* 8*  ALKPHOS 76 72 53  BILITOT 0.7 0.7  0.8  PROT 6.2* 6.1*  5.7*  ALBUMIN 3.2* 3.3* 2.9*   No results for input(s): LIPASE, AMYLASE in the last 8760 hours. No results for input(s): AMMONIA in the last 8760 hours. CBC: Recent Labs    08/25/17 1320  11/15/17 0519  11/28/17 1045 02/07/18 1601 02/07/18 1610 02/08/18 0700  WBC 11.2*   < > 6.6   < > 8.2 13.8*  --  10.2  NEUTROABS 9.1*  --  4.7  --   --  12.4*  --   --   HGB 14.1   < > 10.8*   < > 11.5 10.7* 11.6* 10.2*  HCT 41.4   < > 33.6*   < > 34.8 34.8* 34.0* 32.8*  MCV 94.1   < > 94.4  --  91 92.8  --  91.9  PLT 229   < > 234   < > 265 227  --  209   < > = values in this interval not displayed.   Lipid No results for input(s): CHOL, HDL, LDLCALC, TRIG in the last 8760 hours.  Cardiac Enzymes: Recent Labs    02/07/18 2051 02/08/18 0143 02/08/18 0700  TROPONINI <0.03 <0.03 <0.03   BNP: Recent Labs    08/25/17 1912 11/12/17 1748 02/07/18 1601  BNP 1,571.7* 871.5* 1,134.6*   No results found for: MICROALBUR No results found for: HGBA1C Lab Results  Component Value Date   TSH 2.958 02/08/2018   Lab Results  Component Value Date   VITAMINB12 105 (L) 02/08/2018   Lab Results  Component Value Date   FOLATE 7.5 02/08/2018   Lab Results  Component Value Date   IRON 34 02/08/2018   TIBC 265 02/08/2018   FERRITIN 94 02/08/2018    Imaging and Procedures obtained prior to SNF admission: Dg Chest 2 View  Result Date: 02/07/2018 CLINICAL DATA:  Increasing shortness of breath over the past few days. EXAM: CHEST - 2 VIEW COMPARISON:  Chest x-ray dated November 12, 2017. FINDINGS: Stable cardiomegaly. Slightly worsened diffuse interstitial thickening. Increased small bilateral pleural effusions and bibasilar atelectasis. No pneumothorax. No acute osseous abnormality. IMPRESSION: Slightly worsened pulmonary interstitial edema and small bilateral pleural effusions. Electronically Signed   By: Titus Dubin M.D.   On: 02/07/2018 17:24     Not all labs,  radiology exams or other studies done during hospitalization come through on my EPIC note; however they are reviewed by me.    Assessment and Plan  Acute on chronic diastolic congestive heart failure/chronic hypoxic respiratory failure- serial cardiac enzymes negative, BNP 1134 chest x-ray showing pulmonary edema on admission; cardiology was consulted; negative balance of 1.18 L; recent echo 11/2017 with a EF 65 to 70% normal wall motion; initially on IV Lasix for diuresis then transition to oral Lasix; follow-up with outpatient with cardiology SNF -admitted for OT/PT; continue Lasix 40 mg twice daily, metoprolol 12.5 mg twice daily hemicolon follow-up BMP  Hypertension SNF -stable; continue Lasix 40 mg twice daily and metoprolol 12.5 mg twice daily  Hypothyroidism-TSH 2.9 SNF -continue Synthroid take 50 mcg every other day alternating with 75 mcg every other day  Atrial fibrillation, chronic SNF -heart rate controlled; continue amiodarone 200 mg daily and Eliquis 5 mg twice daily as prophylaxis  Normocytic anemia/B12 deficiency SNF -hemoglobin at baseline 10-11; B12-105; B12 1000 mcg daily started in hospital and will continue     Time spent greater than 45 minutes;> 50% of time with patient was spent reviewing records, labs, tests and studies, counseling and developing plan of care  Rebecca Duos, MD

## 2018-02-18 ENCOUNTER — Encounter: Payer: Self-pay | Admitting: Internal Medicine

## 2018-02-18 DIAGNOSIS — E538 Deficiency of other specified B group vitamins: Secondary | ICD-10-CM | POA: Insufficient documentation

## 2018-02-18 DIAGNOSIS — J9611 Chronic respiratory failure with hypoxia: Secondary | ICD-10-CM | POA: Insufficient documentation

## 2018-02-24 IMAGING — CR DG FOOT COMPLETE 3+V*L*
3 series · 3 of 3 positions shown · non-contrast
Comparison: None.

CLINICAL DATA: Fell today at home going to a bathroom; twisted left
ankle; pain in lateral side of ankle and radiates to left foot; hx
fx about 30 years ago per pt

EXAM:
LEFT FOOT - COMPLETE 3+ VIEW

[x foot lat left]
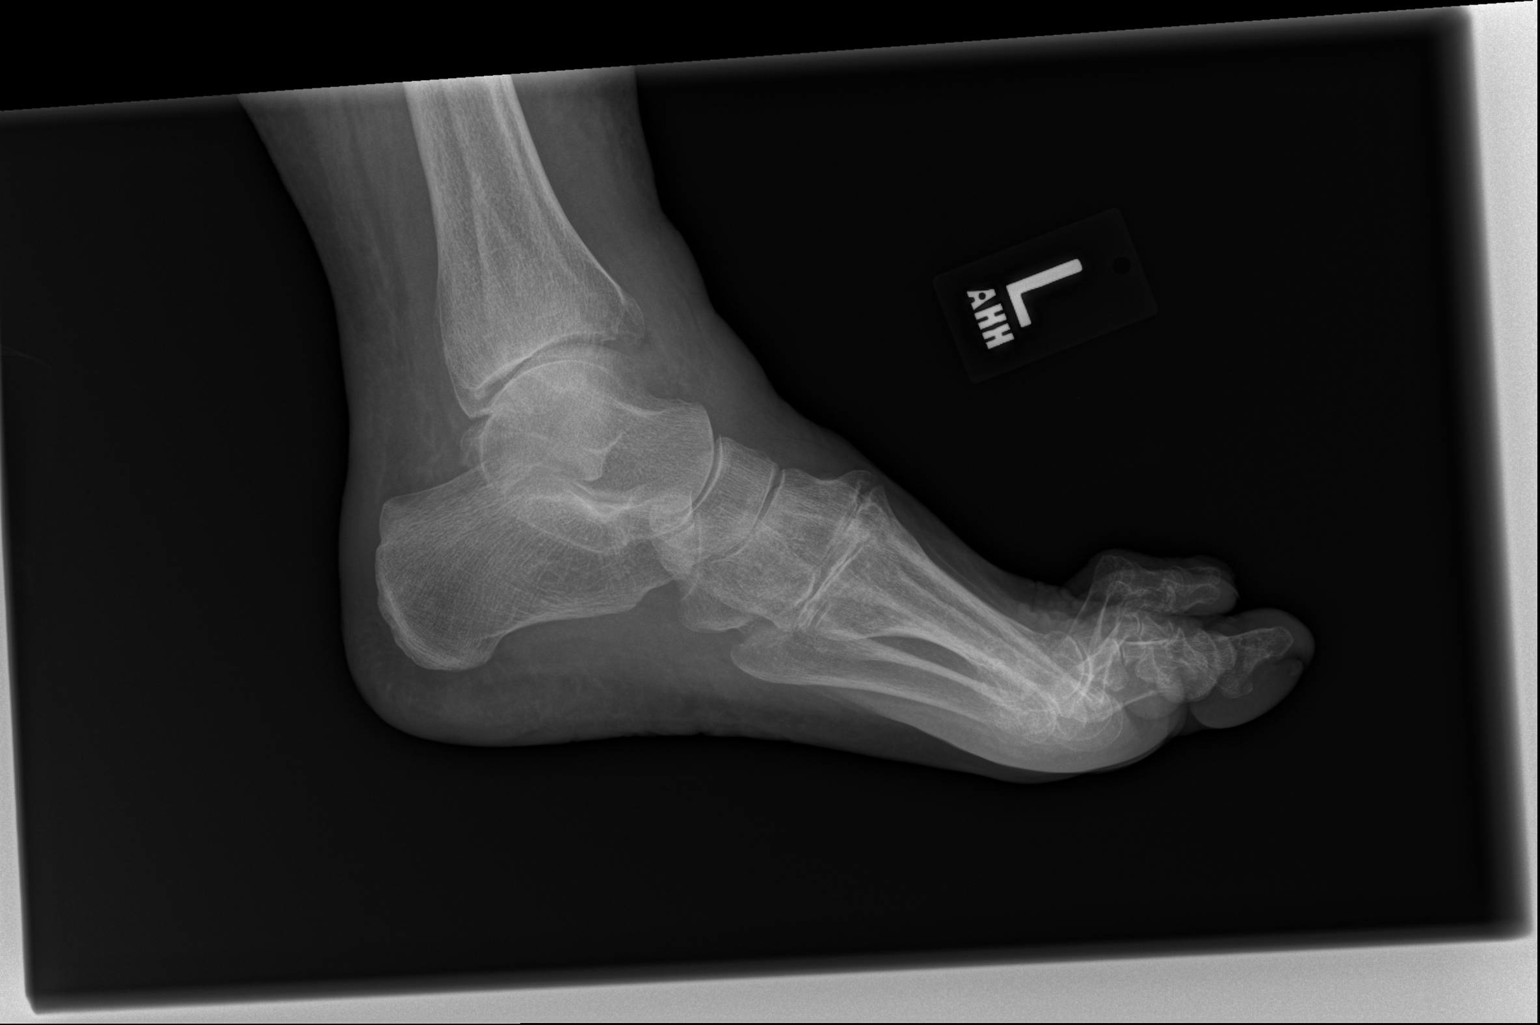

[x foot ap left]
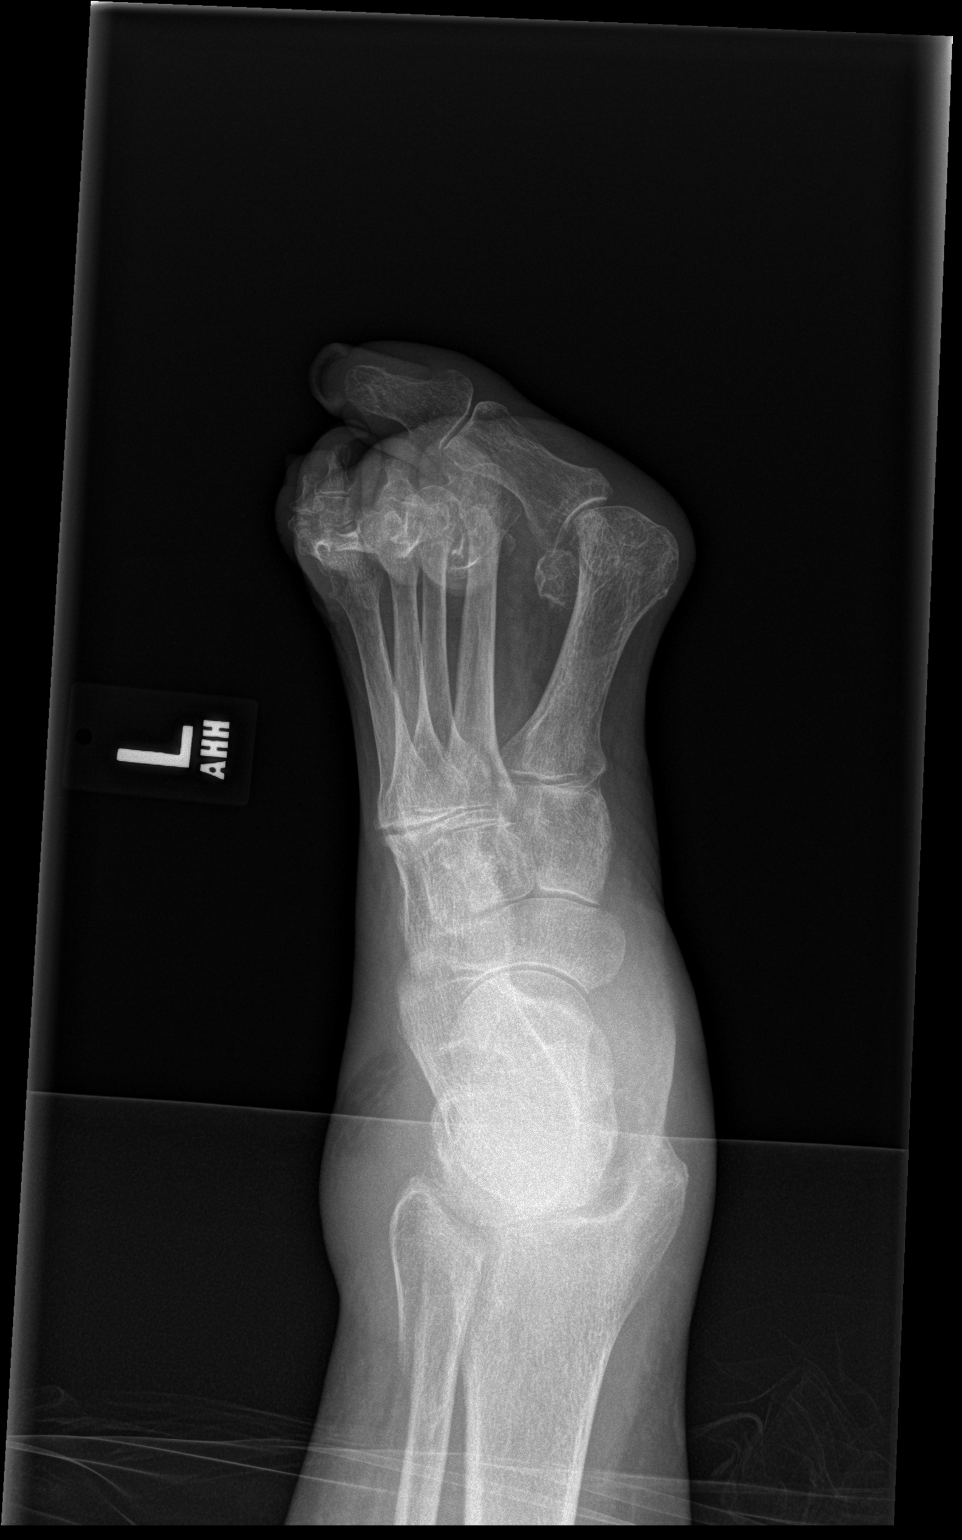

[x foot obl left]
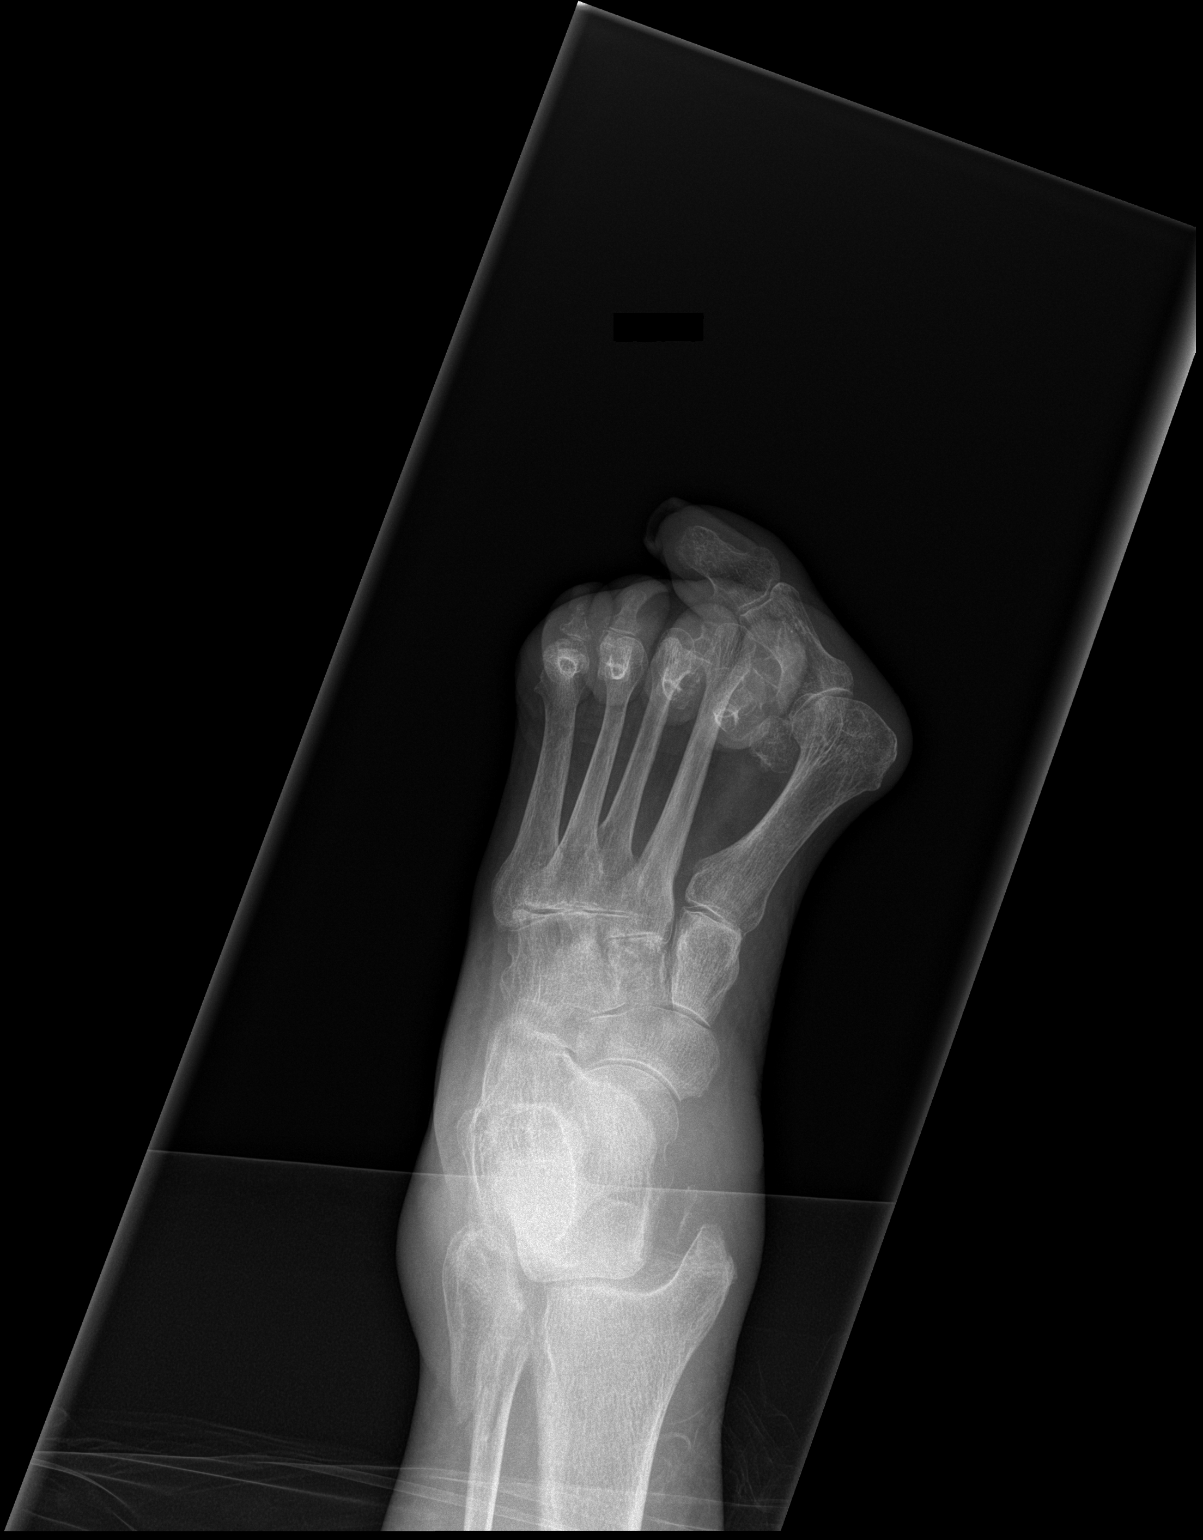

[3 of 3 positions shown; findings below may reference images not displayed]

FINDINGS: The fracture of the distal fibula and avulsion fractures from the
medial talus are again noted as is lateral talar subluxation.

There are no foot fractures. There is a marked hallux valgus
deformity. No dislocation. Degenerative spurring is noted at the
cuneiform metatarsal articulation on the lateral view. Bones are
demineralized.
IMPRESSION: 1. No foot fracture or dislocation.

## 2018-02-27 ENCOUNTER — Ambulatory Visit: Payer: Medicare Other | Admitting: Physician Assistant

## 2018-02-28 ENCOUNTER — Other Ambulatory Visit: Payer: Self-pay | Admitting: *Deleted

## 2018-02-28 NOTE — Patient Outreach (Signed)
[facility]) Care Management  02/28/2018  Darla Mcdonald Derise 12/26/1924 [id number]   Met with Marita [place], SW at [facility]. She reports patient goal is to return home. She does not have a discharge date as of yet, she is unsure of patient 100 days   Met with patient at facility. Patient states she thinks she will go home next week. She lives alone. She has a history of heart failure and A Fib. She is has had 4 admissions in the past year. She has had 4 SNF admissions in the past year.  Patient reports she has a son, daughter, and sister who are supportive, however she will return home alone and they will check in "when they can". She states she and her daughter manage her medications, her daughter is a Marine scientist in [place].  She states she does have transportation to appointments between all of her family.  She is considering getting some private pay help but has not felt like looking into it. Patient states she is not feeling her best today, her feet and legs are swollen and she is having some shortness of breath.  Patient reports they have not weighed her in a couple of days.   RNCM reported patient health concerns to the administrator and floor nurse, [place].  RNCM reviewed [facility] program services. Patient agrees to have a SW speak with her about community resources. Verbal consent obtained, she states no need to have children at visit as she manages her own affairs.   Plan to make referral for [facility] CM LCSW to meet with patient at facility regarding community resources for patient around private pay assistance.  Will collaborate with [facility] as patient is coming up soon on her 100 day SNF day max due to multiple SNF admissions.   Royetta Crochet. Laymond Purser, RN, BSN, [place] [phone]) Business Cell  [phone]) Toll Free Office

## 2018-03-05 ENCOUNTER — Encounter: Payer: Self-pay | Admitting: Internal Medicine

## 2018-03-05 ENCOUNTER — Non-Acute Institutional Stay (SKILLED_NURSING_FACILITY): Payer: Medicare Other | Admitting: Internal Medicine

## 2018-03-05 DIAGNOSIS — I480 Paroxysmal atrial fibrillation: Secondary | ICD-10-CM

## 2018-03-05 DIAGNOSIS — E034 Atrophy of thyroid (acquired): Secondary | ICD-10-CM | POA: Diagnosis not present

## 2018-03-05 DIAGNOSIS — N179 Acute kidney failure, unspecified: Secondary | ICD-10-CM | POA: Diagnosis not present

## 2018-03-05 DIAGNOSIS — I5031 Acute diastolic (congestive) heart failure: Secondary | ICD-10-CM | POA: Diagnosis not present

## 2018-03-05 DIAGNOSIS — J9611 Chronic respiratory failure with hypoxia: Secondary | ICD-10-CM | POA: Diagnosis not present

## 2018-03-05 DIAGNOSIS — D649 Anemia, unspecified: Secondary | ICD-10-CM

## 2018-03-05 DIAGNOSIS — I10 Essential (primary) hypertension: Secondary | ICD-10-CM | POA: Diagnosis not present

## 2018-03-05 DIAGNOSIS — I5033 Acute on chronic diastolic (congestive) heart failure: Secondary | ICD-10-CM | POA: Diagnosis not present

## 2018-03-05 DIAGNOSIS — I5032 Chronic diastolic (congestive) heart failure: Secondary | ICD-10-CM

## 2018-03-05 NOTE — Progress Notes (Signed)
Location:  [place] Room Number: 504-P Place of Service:  SNF (31)   Noah Delaine. Sheppard Coil, MD   PCP: [name], MD Patient Care Team: [name], MD as PCP - General (Family Medicine) [name] [name], MD as PCP - Cardiology (Cardiology)  Extended Emergency Contact Information Primary Emergency Contact: Shaune, [place] Mobile Phone: [phone] Relation: Son Secondary Emergency Contact: [name] [place] Phone: [phone] Mobile Phone: [phone] Relation: Daughter  Allergies  Allergen Reactions  . Flagyl [Metronidazole] Hives  . Lisinopril Cough    Chief Complaint  Patient presents with  . Discharge Note    Discharge from [facility]     HPI:  84 y.o. female with chronic diastolic congestive heart failure, hypothyroidism, chronic atrial fibrillation on anticoagulation, anemia, chronic hypoxic respiratory failure on 3 L at home, who presented to the emergency department with shortness of breath, progressively worsening over the past week.  Patient denied cough congestion fever chills or chest pain there was reported orthopnea and PND.  In the ED BNP was 1134, chest x-ray shows slight worsening of pulmonary interstitial edema and small bilateral pleural effusions.  Patient was admitted to [facility] from 6/5-8 where she was treated for acute on chronic diastolic congestive heart failure exacerbation.  She was treated with IV Lasix for diuresis then transition to oral Lasix.  Hospital course was unremarkable and all chronic medical problems were stable.  Patient was admitted to skilled nursing facility for OT/PT and is now ready to be discharged to an assisted living facility.    Past Medical History:  Diagnosis Date  . DDD (degenerative disc disease), cervical    severe/  auto fusion c2-c5  . Diverticulosis   . Hemorrhoids   . History of adenomatous polyp of colon    1995  adenomatous  polyp/  1997 & 2003  hyperplastic polyp's  . History of bladder cancer urologist-  dr [name]   s/p  TURBT's  . History of cancer of ureter   . Hypertension   . Hypothyroid   . Nocturia   . Osteoarthritis   . Osteopenia     Past Surgical History:  Procedure Laterality Date  . BACK SURGERY    . CARDIOVERSION N/A 08/09/2017   Procedure: CARDIOVERSION;  Surgeon: Larey Dresser, MD;  Location: [facility];  Service: Cardiovascular;  Laterality: N/A;  . CATARACT EXTRACTION W/ INTRAOCULAR LENS  IMPLANT, BILATERAL Bilateral   . CYSTOSCOPY W/ RETROGRADES  10/03/2011   Procedure: CYSTOSCOPY WITH RETROGRADE PYELOGRAM;  Surgeon: Molli Hazard, MD;  Location: [facility];  Service: Urology;;  . Consuela Mimes W/ RETROGRADES Bilateral 06/17/2016   Procedure: CYSTOSCOPY WITH RETROGRADE PYELOGRAM;  Surgeon: Alexis Frock, MD;  Location: [facility];  Service: Urology;  Laterality: Bilateral;  . CYSTOSCOPY WITH BIOPSY N/A 06/17/2016   Procedure: CYSTOSCOPY WITH BIOPSY AND FULGERATION;  Surgeon: Alexis Frock, MD;  Location: [facility];  Service: Urology;  Laterality: N/A;  . LAMINECTOMY  2009   L3 - 4  . ORIF ANKLE FRACTURE Left 08/28/2017   Procedure: OPEN REDUCTION INTERNAL FIXATION (ORIF) ANKLE FRACTURE;  Surgeon: Shona Needles, MD;  Location: [facility];  Service: Orthopedics;  Laterality: Left;  . TRANSURETHRAL RESECTION OF BLADDER  x3 --  2009; 2010; 06-29-2010  . TRANSURETHRAL RESECTION OF BLADDER TUMOR  10/03/2011   Procedure: TRANSURETHRAL RESECTION OF BLADDER TUMOR (TURBT);  Surgeon: Molli Hazard, MD;  Location: [facility];  Service: Urology;  Laterality: N/A;     reports that she quit smoking about 12 years ago. Her smoking use included cigarettes. She quit after 10.00 years of use. She has never used smokeless tobacco. She reports that she drinks alcohol. She reports that she does not use drugs. Social History    Socioeconomic History  . Marital status: Widowed    Spouse name: Not on file  . Number of children: Not on file  . Years of education: Not on file  . Highest education level: Not on file  Occupational History  . Occupation: Retired  Scientific laboratory technician  . Financial resource strain: Not on file  . Food insecurity:    Worry: Not on file    Inability: Not on file  . Transportation needs:    Medical: Not on file    Non-medical: Not on file  Tobacco Use  . Smoking status: Former Smoker    Years: 10.00    Types: Cigarettes    Last attempt to quit: 09/25/2005    Years since quitting: 12.4  . Smokeless tobacco: Never Used  Substance and Sexual Activity  . Alcohol use: Yes  . Drug use: No  . Sexual activity: Not on file  Lifestyle  . Physical activity:    Days per week: Not on file    Minutes per session: Not on file  . Stress: Not on file  Relationships  . Social connections:    Talks on phone: Not on file    Gets together: Not on file    Attends religious service: Not on file    Active member of club or organization: Not on file    Attends meetings of clubs or organizations: Not on file    Relationship status: Not on file  . Intimate partner violence:    Fear of current or ex partner: Not on file    Emotionally abused: Not on file    Physically abused: Not on file    Forced sexual activity: Not on file  Other Topics Concern  . Not on file  Social History Narrative  . Not on file    Pertinent  Health Maintenance Due  Topic Date Due  . DEXA SCAN  07/05/1992  . PNA vac Low Risk Adult (1 of 2 - PCV13) 07/05/1992  . INFLUENZA VACCINE  04/05/2018    Medications: Allergies as of 03/05/2018      Reactions   Flagyl [metronidazole] Hives   Lisinopril Cough      Medication List        Accurate as of 03/05/18 11:59 PM. Always use your most recent med list.          acetaminophen 500 MG tablet Commonly known as:  TYLENOL Take 500 mg by mouth every 6 (six) hours as  needed for headache (pain).   amiodarone 200 MG tablet Commonly known as:  PACERONE Take 1 tablet (200 mg total) by mouth daily.   apixaban 5 MG Tabs tablet Commonly known as:  ELIQUIS Take 1 tablet (5 mg total) by mouth 2 (two) times daily.   furosemide 20 MG tablet Commonly known as:  LASIX Take 40 mg (2 tabs) in a.m. And take 20mg  (1 tab) in the afternoon   levothyroxine 50 MCG tablet Commonly known as:  SYNTHROID, LEVOTHROID Take 50-75 mcg by mouth See admin instructions. Take every other alternating between 1mcg and 46mcg.   metoprolol tartrate 25 MG tablet Commonly known as:  LOPRESSOR Take 0.5 tablets (12.5  mg total) by mouth 2 (two) times daily.   ondansetron 4 MG tablet Commonly known as:  ZOFRAN Take 4 mg by mouth every 6 (six) hours as needed for nausea or vomiting.   vitamin B-12 1000 MCG tablet Commonly known as:  CYANOCOBALAMIN Take 1 tablet (1,000 mcg total) by mouth daily.        Vitals:   03/05/18 1205  BP: (!) 147/67  Pulse: (!) 54  Resp: (!) 22  Temp: 97.8 F (36.6 C)  SpO2: 93%  Weight: 158 lb (71.7 kg)  Height: 5\' 4"  (1.626 m)   Body mass index is 27.12 kg/m.  Physical Exam  GENERAL APPEARANCE: Alert, conversant. No acute distress.  HEENT: Unremarkable. RESPIRATORY: Breathing is even, unlabored. Lung sounds are clear   CARDIOVASCULAR: Heart RRR 1/6 murmur, no rubs or gallops. No peripheral edema.  GASTROINTESTINAL: Abdomen is soft, non-tender, not distended w/ normal bowel sounds.  NEUROLOGIC: Cranial nerves 2-12 grossly intact. Moves all extremities   Labs reviewed: Basic Metabolic Panel: Recent Labs    11/14/17 0507  02/07/18 2051 02/08/18 0700 02/09/18 0646 02/10/18 0712  NA 139   < >  --  139 139 137  K 3.8   < >  --  3.9 3.8 3.9  CL 101   < >  --  97* 100* 97*  CO2 29   < >  --  32 28 32  GLUCOSE 91   < >  --  90 83 90  BUN 25*   < >  --  19 26* 24*  CREATININE 0.91   < >  --  1.10* 1.02* 1.00  CALCIUM 8.6*   < >  --   8.6* 8.6* 8.5*  MG 2.0  --  1.9 1.9  --   --   PHOS  --   --   --  3.4  --   --    < > = values in this interval not displayed.   No results found for: [facility] Liver Function Tests: Recent Labs    11/12/17 1747 11/13/17 0528 02/08/18 0700  AST 16 15 11*  ALT 21 12* 8*  ALKPHOS 76 72 53  BILITOT 0.7 0.7 0.8  PROT 6.2* 6.1* 5.7*  ALBUMIN 3.2* 3.3* 2.9*   No results for input(s): LIPASE, AMYLASE in the last 8760 hours. No results for input(s): AMMONIA in the last 8760 hours. CBC: Recent Labs    08/25/17 1320  11/15/17 0519  11/28/17 1045 02/07/18 1601 02/07/18 1610 02/08/18 0700  WBC 11.2*   < > 6.6   < > 8.2 13.8*  --  10.2  NEUTROABS 9.1*  --  4.7  --   --  12.4*  --   --   HGB 14.1   < > 10.8*   < > 11.5 10.7* 11.6* 10.2*  HCT 41.4   < > 33.6*   < > 34.8 34.8* 34.0* 32.8*  MCV 94.1   < > 94.4  --  91 92.8  --  91.9  PLT 229   < > 234   < > 265 227  --  209   < > = values in this interval not displayed.   Lipid No results for input(s): CHOL, HDL, LDLCALC, TRIG in the last 8760 hours. Cardiac Enzymes: Recent Labs    02/07/18 2051 02/08/18 0143 02/08/18 0700  TROPONINI <0.03 <0.03 <0.03   BNP: Recent Labs    08/25/17 1912 11/12/17 1748 02/07/18 1601  BNP 1,571.7* 871.5*  1,134.6*   CBG: No results for input(s): GLUCAP in the last 8760 hours.  Procedures and Imaging Studies During Stay: No results found.  Assessment/Plan:   Acute diastolic CHF (congestive heart failure) (HCC)  Chronic diastolic CHF (congestive heart failure) (HCC)  Acute on chronic diastolic (congestive) heart failure (HCC)  Chronic respiratory failure with hypoxia (HCC)  Paroxysmal atrial fibrillation (HCC)  AKI (acute kidney injury) ([place])  Normocytic anemia  Hypothyroidism due to acquired atrophy of thyroid  Essential hypertension   Patient is being discharged with the following home health services: OT/PT  Patient is being discharged with the following durable  medical equipment: Semi-electric hospital bed, stationary and portable oxygen at 3 L nasal cannula continuously  Patient has been advised to f/u with their PCP in 1-2 weeks to bring them up to date on their rehab stay.  Social services at facility was responsible for arranging this appointment.  Pt was provided with a 30 day supply of prescriptions for medications and refills must be obtained from their PCP.  For controlled substances, a more limited supply may be provided adequate until PCP appointment only.  Time spent greater than 30 minutes;> 50% of time with patient was spent reviewing records, labs, tests and studies, counseling and developing plan of care   Noah Delaine. Sheppard Coil, MD

## 2018-03-06 ENCOUNTER — Other Ambulatory Visit: Payer: Self-pay | Admitting: Licensed Clinical Social Worker

## 2018-03-06 NOTE — Patient Outreach (Signed)
[facility]) Care Management  03/06/2018  Rebecca Norman 04-18-1925 [id number]  [facility] CSW arrived at [facility] to complete [facility] Consult after receiving new referral last week. Patient was not in her room but [facility] CSW was able to find her getting her hair done. [facility] CSW introduced self, reason for visit and of [place]. HIPPA verifications received. Patient was the only patient in the room and was agreeable to discuss her discharge plan from SNF with [facility] CSW. Patient reports that she is actually discharging to [facility] this afternoon after she gets her hair done. She reports that she researched ALF on her own and chose facility based on good reviews. Patient reports that either her daughter or retired son will provide stable transportation for her to get to her medical appointments upon SNF discharge. Patient reports that her family is currently moving her belongings into her new room at ALF. Patient shares that she is wanting to participate in the activities there as she benefits from socialization. Patient has tablet and reports using it to doing paint by number, games and to socialize with friends. Patient reports that she feels comfortable with current discharge plan back home and reports not needing [place] if she is going to have [facility] services. [facility] CSW spoke with SNF social worker and was informed that patient will discharge back home with [facility]: PT/OT and that she will gain the following medical equipments: hospital bed, stationary and portable oxygen.  [facility] CSW will close case and sign off at this time as patient will discharge from [place] [facility] to ALF today.  Eula Fried, BSW, MSW, [email or web address] Phone: [phone] Fax: [phone]

## 2018-03-07 ENCOUNTER — Telehealth: Payer: Self-pay | Admitting: Adult Health

## 2018-03-07 NOTE — Telephone Encounter (Signed)
Spoke with daughter, ok per DPR. She was wanting to know if patients Amlodipine stopped recently. Advised was stopped at hospital discharge

## 2018-03-07 NOTE — Telephone Encounter (Signed)
New message    Patient's daughter calling to verify medications.  Patient is now in a SNF, and the daughter is unsure of medications the patient should still be taking

## 2018-03-10 DIAGNOSIS — I482 Chronic atrial fibrillation: Secondary | ICD-10-CM | POA: Diagnosis not present

## 2018-03-10 DIAGNOSIS — M1991 Primary osteoarthritis, unspecified site: Secondary | ICD-10-CM | POA: Diagnosis not present

## 2018-03-10 DIAGNOSIS — I130 Hypertensive heart and chronic kidney disease with heart failure and stage 1 through stage 4 chronic kidney disease, or unspecified chronic kidney disease: Secondary | ICD-10-CM | POA: Diagnosis not present

## 2018-03-10 DIAGNOSIS — Z8551 Personal history of malignant neoplasm of bladder: Secondary | ICD-10-CM | POA: Diagnosis not present

## 2018-03-10 DIAGNOSIS — Z8542 Personal history of malignant neoplasm of other parts of uterus: Secondary | ICD-10-CM | POA: Diagnosis not present

## 2018-03-10 DIAGNOSIS — N189 Chronic kidney disease, unspecified: Secondary | ICD-10-CM | POA: Diagnosis not present

## 2018-03-10 DIAGNOSIS — E039 Hypothyroidism, unspecified: Secondary | ICD-10-CM | POA: Diagnosis not present

## 2018-03-10 DIAGNOSIS — I5033 Acute on chronic diastolic (congestive) heart failure: Secondary | ICD-10-CM | POA: Diagnosis not present

## 2018-03-10 DIAGNOSIS — J9611 Chronic respiratory failure with hypoxia: Secondary | ICD-10-CM | POA: Diagnosis not present

## 2018-03-10 DIAGNOSIS — M8580 Other specified disorders of bone density and structure, unspecified site: Secondary | ICD-10-CM | POA: Diagnosis not present

## 2018-03-10 DIAGNOSIS — M5031 Other cervical disc degeneration,  high cervical region: Secondary | ICD-10-CM | POA: Diagnosis not present

## 2018-03-12 DIAGNOSIS — I5033 Acute on chronic diastolic (congestive) heart failure: Secondary | ICD-10-CM | POA: Diagnosis not present

## 2018-03-12 DIAGNOSIS — J9611 Chronic respiratory failure with hypoxia: Secondary | ICD-10-CM | POA: Diagnosis not present

## 2018-03-12 DIAGNOSIS — M5031 Other cervical disc degeneration,  high cervical region: Secondary | ICD-10-CM | POA: Diagnosis not present

## 2018-03-12 DIAGNOSIS — I482 Chronic atrial fibrillation: Secondary | ICD-10-CM | POA: Diagnosis not present

## 2018-03-12 DIAGNOSIS — I130 Hypertensive heart and chronic kidney disease with heart failure and stage 1 through stage 4 chronic kidney disease, or unspecified chronic kidney disease: Secondary | ICD-10-CM | POA: Diagnosis not present

## 2018-03-12 DIAGNOSIS — N189 Chronic kidney disease, unspecified: Secondary | ICD-10-CM | POA: Diagnosis not present

## 2018-03-15 ENCOUNTER — Ambulatory Visit (INDEPENDENT_AMBULATORY_CARE_PROVIDER_SITE_OTHER): Payer: Medicare Other | Admitting: Physician Assistant

## 2018-03-15 ENCOUNTER — Encounter: Payer: Self-pay | Admitting: Physician Assistant

## 2018-03-15 VITALS — BP 110/52 | HR 55 | Ht 64.0 in | Wt 161.0 lb

## 2018-03-15 DIAGNOSIS — N189 Chronic kidney disease, unspecified: Secondary | ICD-10-CM | POA: Diagnosis not present

## 2018-03-15 DIAGNOSIS — I5032 Chronic diastolic (congestive) heart failure: Secondary | ICD-10-CM

## 2018-03-15 DIAGNOSIS — M5031 Other cervical disc degeneration,  high cervical region: Secondary | ICD-10-CM | POA: Diagnosis not present

## 2018-03-15 DIAGNOSIS — I10 Essential (primary) hypertension: Secondary | ICD-10-CM | POA: Diagnosis not present

## 2018-03-15 DIAGNOSIS — I480 Paroxysmal atrial fibrillation: Secondary | ICD-10-CM | POA: Diagnosis not present

## 2018-03-15 DIAGNOSIS — E039 Hypothyroidism, unspecified: Secondary | ICD-10-CM | POA: Diagnosis not present

## 2018-03-15 DIAGNOSIS — I482 Chronic atrial fibrillation: Secondary | ICD-10-CM | POA: Diagnosis not present

## 2018-03-15 DIAGNOSIS — I5033 Acute on chronic diastolic (congestive) heart failure: Secondary | ICD-10-CM | POA: Diagnosis not present

## 2018-03-15 DIAGNOSIS — I130 Hypertensive heart and chronic kidney disease with heart failure and stage 1 through stage 4 chronic kidney disease, or unspecified chronic kidney disease: Secondary | ICD-10-CM | POA: Diagnosis not present

## 2018-03-15 DIAGNOSIS — J9611 Chronic respiratory failure with hypoxia: Secondary | ICD-10-CM | POA: Diagnosis not present

## 2018-03-15 NOTE — Patient Instructions (Signed)
Medication Instructions:  Your physician recommends that you continue on your current medications as directed. Please refer to the Current Medication list given to you today.  Labwork: Your physician recommends that you return for lab work in: BMET-complete at PCP office  Testing/Procedures: None   Follow-Up: Your provider recommends that you schedule a follow-up appointment in: 1 month with Almyra Deforest, PA-C or Jory Sims, DNP Your provider recommends that you schedule a follow-up appointment in: 3 months with Dr [name].  Any Other Special Instructions Will Be Listed Below (If Applicable). If you need a refill on your cardiac medications before your next appointment, please call your pharmacy.

## 2018-03-15 NOTE — Progress Notes (Signed)
Cardiology Office Note    Date:  03/17/2018   ID:  Rebecca Norman, DOB [birth year], MRN [id number]  PCP:  [name], MD  Cardiologist:  Dr. [name]   Chief Complaint  Patient presents with  . Follow-up    seen for Dr. [name].     History of Present Illness:  Rebecca Norman is a 84 y.o. female with PMH of HTN, hypothyroidism, chronic diastolic heart failure and PAF.  She was diagnosed with atrial fibrillation with RVR on event monitors in October 2018.  In December 2018, she was seen for worsening fatigue and weakness and found to be in atrial fibrillation with RVR.  She was also treated for acute on chronic diastolic heart failure at the time.  She subsequently underwent DC cardioversion on 08/09/2017.  Post cardioversion, she was maintaining sinus rhythm but noted to have brief episodes of PAT on telemetry.  Therefore amiodarone was added to her medical regimen.  Due to episodes of hypotension during the admission, diltiazem was discontinued and Lopressor was cut back.  Her discharge dry weight was 164 pounds. She was last seen by [name], NP on 01/31/2018 for generalized fatigue.  There was some concern for possible UTI.  She was later seen by her PCP and was started on antibiotic.  She presented to the ED on 02/07/2018 for increasing shortness of breath, orthopnea and PND.  BNP was elevated at 1134.  Chest x-ray showed worsening pulmonary interstitial show edema.  She underwent IV diuresis.  Her eventual discharge weight was 158 pounds.  She was discharged on 40 mg a.m. and 20 mg p.m. of Lasix.  Due to bradycardia, her metoprolol was reduced to 12.5 mg twice daily during this admission.  Patient presents today for cardiology office visit.  For some reason, her Lasix was increased from the previous 40 mg a.m. and 20 mg p.m. to a 120 mg daily of Lasix. She says it is possible this was increased during her geriatric appointment recently, however since the last appointment note  has not been completed, I am unable to see if the medication change was made at that time.  I highly recommend her to have a basic metabolic panel to assess for signs of dehydration and volume depletion.  She preferred to have the lab work done at her PCPs office.  She has a visit with Dr. Brigitte Pulse next Tuesday.  If her creatinine is elevated, I would recommend decrease Lasix to 40 mg a.m. and 20 mg p.m again with instruction of taking additional 20 mg in the afternoon if weight increased by more than 3 pounds overnight or 5 pounds in single week.  Otherwise her breathing is currently at baseline on home oxygen.  She denies any chest discomfort, lower extremity edema, orthopnea or PND.  On exam, she does have some crackle in the right lower base of the lung which is likely atelectasis and not pulmonary edema.  Past Medical History:  Diagnosis Date  . DDD (degenerative disc disease), cervical    severe/  auto fusion c2-c5  . Diverticulosis   . Hemorrhoids   . History of adenomatous polyp of colon    1995  adenomatous polyp/  1997 & 2003  hyperplastic polyp's  . History of bladder cancer urologist-  dr [name]   s/p  TURBT's  . History of cancer of ureter   . Hypertension   . Hypothyroid   . Nocturia   . Osteoarthritis   . Osteopenia  Past Surgical History:  Procedure Laterality Date  . BACK SURGERY    . CARDIOVERSION Rebecca Norman/A 08/09/2017   Procedure: CARDIOVERSION;  Surgeon: Larey Dresser, MD;  Location: [facility] ENDOSCOPY;  Service: Cardiovascular;  Laterality: Rebecca Norman/A;  . CATARACT EXTRACTION W/ INTRAOCULAR LENS  IMPLANT, BILATERAL Bilateral   . CYSTOSCOPY W/ RETROGRADES  10/03/2011   Procedure: CYSTOSCOPY WITH RETROGRADE PYELOGRAM;  Surgeon: Molli Hazard, MD;  Location: [facility];  Service: Urology;;  . Consuela Mimes W/ RETROGRADES Bilateral 06/17/2016   Procedure: CYSTOSCOPY WITH RETROGRADE PYELOGRAM;  Surgeon: Alexis Frock, MD;  Location: [facility];  Service:  Urology;  Laterality: Bilateral;  . CYSTOSCOPY WITH BIOPSY Rebecca Norman/A 06/17/2016   Procedure: CYSTOSCOPY WITH BIOPSY AND FULGERATION;  Surgeon: Alexis Frock, MD;  Location: [facility];  Service: Urology;  Laterality: Rebecca Norman/A;  . LAMINECTOMY  2009   L3 - 4  . ORIF ANKLE FRACTURE Left 08/28/2017   Procedure: OPEN REDUCTION INTERNAL FIXATION (ORIF) ANKLE FRACTURE;  Surgeon: Shona Needles, MD;  Location: [facility];  Service: Orthopedics;  Laterality: Left;  . TRANSURETHRAL RESECTION OF BLADDER  x3 --  2009; 2010; 06-29-2010  . TRANSURETHRAL RESECTION OF BLADDER TUMOR  10/03/2011   Procedure: TRANSURETHRAL RESECTION OF BLADDER TUMOR (TURBT);  Surgeon: Molli Hazard, MD;  Location: [facility];  Service: Urology;  Laterality: Rebecca Norman/A;    Current Medications: Outpatient Medications Prior to Visit  Medication Sig Dispense Refill  . acetaminophen (TYLENOL) 500 MG tablet Take 500 mg by mouth every 6 (six) hours as needed for headache (pain).     Marland Kitchen amiodarone (PACERONE) 200 MG tablet Take 1 tablet (200 mg total) by mouth daily. 30 tablet 8  . apixaban (ELIQUIS) 5 MG TABS tablet Take 1 tablet (5 mg total) by mouth 2 (two) times daily. 180 tablet 1  . furosemide (LASIX) 20 MG tablet Take 40 mg (2 tabs) in a.m. And take 20mg  (1 tab) in the afternoon (Patient taking differently: Take 120 mg by mouth daily. Take 40 mg ,3 tabs- 120 mg- po daily)    . levothyroxine (SYNTHROID, LEVOTHROID) 50 MCG tablet Take 50-75 mcg by mouth See admin instructions. Take every other alternating between 34mcg and 39mcg.    . metoprolol tartrate (LOPRESSOR) 25 MG tablet Take 0.5 tablets (12.5 mg total) by mouth 2 (two) times daily.    . ondansetron (ZOFRAN) 4 MG tablet Take 4 mg by mouth every 6 (six) hours as needed for nausea or vomiting.    . vitamin B-12 (CYANOCOBALAMIN) 1000 MCG tablet Take 1 tablet (1,000 mcg total) by mouth daily.     No facility-administered medications prior to visit.       Allergies:   Flagyl [metronidazole] and Lisinopril   Social History   Socioeconomic History  . Marital status: Widowed    Spouse name: Not on file  . Number of children: Not on file  . Years of education: Not on file  . Highest education level: Not on file  Occupational History  . Occupation: Retired  Scientific laboratory technician  . Financial resource strain: Not on file  . Food insecurity:    Worry: Not on file    Inability: Not on file  . Transportation needs:    Medical: Not on file    Non-medical: Not on file  Tobacco Use  . Smoking status: Former Smoker    Years: 10.00    Types: Cigarettes    Last attempt to quit: 09/25/2005    Years  since quitting: 12.4  . Smokeless tobacco: Never Used  Substance and Sexual Activity  . Alcohol use: Yes  . Drug use: No  . Sexual activity: Not on file  Lifestyle  . Physical activity:    Days per week: Not on file    Minutes per session: Not on file  . Stress: Not on file  Relationships  . Social connections:    Talks on phone: Not on file    Gets together: Not on file    Attends religious service: Not on file    Active member of club or organization: Not on file    Attends meetings of clubs or organizations: Not on file    Relationship status: Not on file  Other Topics Concern  . Not on file  Social History Narrative  . Not on file     Family History:  The patient's family history includes Breast cancer in her sister; Hernia (age of onset: 7) in her sister; Stomach cancer in her sister.   ROS:   Please see the history of present illness.    ROS All other systems reviewed and are negative.   PHYSICAL EXAM:   VS:  BP (!) 110/52   Pulse (!) 55   Ht 5\' 4"  (1.626 m)   Wt 161 lb (73 kg)   BMI 27.64 kg/m    GEN: Well nourished, well developed, in no acute distress  HEENT: normal  Neck: no JVD, carotid bruits, or masses Cardiac: RRR; no murmurs, rubs, or gallops,no edema  Respiratory:  Crackle in R base GI: soft, nontender,  nondistended, + BS MS: no deformity or atrophy  Skin: warm and dry, no rash Neuro:  Alert and Oriented x 3, Strength and sensation are intact Psych: euthymic mood, full affect  Wt Readings from Last 3 Encounters:  03/15/18 161 lb (73 kg)  03/05/18 158 lb (71.7 kg)  02/12/18 158 lb 8 oz (71.9 kg)      Studies/Labs Reviewed:   EKG:  EKG is not ordered today.    Recent Labs: 02/07/2018: B Natriuretic Peptide 1,134.6 02/08/2018: ALT 8; Hemoglobin 10.2; Magnesium 1.9; Platelets 209; TSH 2.958 02/10/2018: BUN 24; Creatinine, Ser 1.00; Potassium 3.9; Sodium 137   Lipid Panel No results found for: CHOL, TRIG, HDL, CHOLHDL, VLDL, LDLCALC, LDLDIRECT  Additional studies/ records that were reviewed today include:   Echo 11/14/2017 LV EF: 65% -   70%  Study Conclusions  - Left ventricle: The cavity size was normal. There was severe   concentric hypertrophy. Systolic function was vigorous. The   estimated ejection fraction was in the range of 65% to 70%. Wall   motion was normal; there were no regional wall motion   abnormalities. Doppler parameters are consistent with high   ventricular filling pressure. - Aortic valve: There was very mild stenosis. There was trivial   regurgitation. Peak velocity (S): 215 cm/s. Mean gradient (S): 11   mm Hg. Valve area (Vmax): 1.94 cm^2. - Mitral valve: Severely calcified annulus. Mildly thickened   leaflets . There was mild regurgitation. - Left atrium: The atrium was moderately dilated.   ASSESSMENT:    1. Chronic diastolic CHF (congestive heart failure) ([place])   2. Essential hypertension   3. Hypothyroidism, unspecified type   4. PAF (paroxysmal atrial fibrillation) ([facility])      PLAN:  In order of problems listed above:  1. Chronic diastolic heart failure: Her Lasix has been increased from the previous 40 mg a.m./20 mg p.m. to the  current 120 mg daily since last discharge.  She wished to obtain her basic metabolic panel next Tuesday at her  PCPs office, if renal function worsens, I plan to reduce her diuretic back to the previous dose  2. Hypertension: Blood pressure stable  3. Hypothyroidism: Managed by her primary care provider  4. PAF: Continue Eliquis and metoprolol.    Medication Adjustments/Labs and Tests Ordered: Current medicines are reviewed at length with the patient today.  Concerns regarding medicines are outlined above.  Medication changes, Labs and Tests ordered today are listed in the Patient Instructions below. Patient Instructions  Medication Instructions:  Your physician recommends that you continue on your current medications as directed. Please refer to the Current Medication list given to you today.  Labwork: Your physician recommends that you return for lab work in: BMET-complete at PCP office  Testing/Procedures: None   Follow-Up: Your provider recommends that you schedule a follow-up appointment in: 1 month with Almyra Deforest, PA-C or Jory Sims, DNP Your provider recommends that you schedule a follow-up appointment in: 3 months with Dr [name].  Any Other Special Instructions Will Be Listed Below (If Applicable). If you need a refill on your cardiac medications before your next appointment, please call your pharmacy.     Hilbert Corrigan, [place]  03/17/2018 11:46 PM    [facility] [address], [place], [place]  [address] Phone: [phone]; Fax: [phone]

## 2018-03-16 DIAGNOSIS — I130 Hypertensive heart and chronic kidney disease with heart failure and stage 1 through stage 4 chronic kidney disease, or unspecified chronic kidney disease: Secondary | ICD-10-CM | POA: Diagnosis not present

## 2018-03-16 DIAGNOSIS — J9611 Chronic respiratory failure with hypoxia: Secondary | ICD-10-CM | POA: Diagnosis not present

## 2018-03-16 DIAGNOSIS — N189 Chronic kidney disease, unspecified: Secondary | ICD-10-CM | POA: Diagnosis not present

## 2018-03-16 DIAGNOSIS — M5031 Other cervical disc degeneration,  high cervical region: Secondary | ICD-10-CM | POA: Diagnosis not present

## 2018-03-16 DIAGNOSIS — I5033 Acute on chronic diastolic (congestive) heart failure: Secondary | ICD-10-CM | POA: Diagnosis not present

## 2018-03-16 DIAGNOSIS — I482 Chronic atrial fibrillation: Secondary | ICD-10-CM | POA: Diagnosis not present

## 2018-03-17 ENCOUNTER — Encounter: Payer: Self-pay | Admitting: Physician Assistant

## 2018-03-19 DIAGNOSIS — J9611 Chronic respiratory failure with hypoxia: Secondary | ICD-10-CM | POA: Diagnosis not present

## 2018-03-19 DIAGNOSIS — M5031 Other cervical disc degeneration,  high cervical region: Secondary | ICD-10-CM | POA: Diagnosis not present

## 2018-03-19 DIAGNOSIS — E039 Hypothyroidism, unspecified: Secondary | ICD-10-CM | POA: Diagnosis not present

## 2018-03-19 DIAGNOSIS — I5033 Acute on chronic diastolic (congestive) heart failure: Secondary | ICD-10-CM | POA: Diagnosis not present

## 2018-03-19 DIAGNOSIS — N189 Chronic kidney disease, unspecified: Secondary | ICD-10-CM | POA: Diagnosis not present

## 2018-03-19 DIAGNOSIS — I482 Chronic atrial fibrillation: Secondary | ICD-10-CM | POA: Diagnosis not present

## 2018-03-19 DIAGNOSIS — I130 Hypertensive heart and chronic kidney disease with heart failure and stage 1 through stage 4 chronic kidney disease, or unspecified chronic kidney disease: Secondary | ICD-10-CM | POA: Diagnosis not present

## 2018-03-20 DIAGNOSIS — D649 Anemia, unspecified: Secondary | ICD-10-CM | POA: Diagnosis not present

## 2018-03-20 DIAGNOSIS — N390 Urinary tract infection, site not specified: Secondary | ICD-10-CM | POA: Diagnosis not present

## 2018-03-20 DIAGNOSIS — E538 Deficiency of other specified B group vitamins: Secondary | ICD-10-CM | POA: Diagnosis not present

## 2018-03-20 DIAGNOSIS — I5033 Acute on chronic diastolic (congestive) heart failure: Secondary | ICD-10-CM | POA: Diagnosis not present

## 2018-03-20 DIAGNOSIS — F322 Major depressive disorder, single episode, severe without psychotic features: Secondary | ICD-10-CM | POA: Diagnosis not present

## 2018-03-20 DIAGNOSIS — E039 Hypothyroidism, unspecified: Secondary | ICD-10-CM | POA: Diagnosis not present

## 2018-03-20 DIAGNOSIS — I4891 Unspecified atrial fibrillation: Secondary | ICD-10-CM | POA: Diagnosis not present

## 2018-03-25 ENCOUNTER — Encounter (HOSPITAL_COMMUNITY): Payer: Self-pay | Admitting: Emergency Medicine

## 2018-03-25 ENCOUNTER — Inpatient Hospital Stay (HOSPITAL_COMMUNITY)
Admission: EM | Admit: 2018-03-25 | Discharge: 2018-03-28 | DRG: 291 | Disposition: A | Discharge status: Home Health | Payer: Medicare Other | Attending: Internal Medicine | Admitting: Internal Medicine

## 2018-03-25 ENCOUNTER — Other Ambulatory Visit: Payer: Self-pay

## 2018-03-25 ENCOUNTER — Emergency Department (HOSPITAL_COMMUNITY): Payer: Medicare Other

## 2018-03-25 DIAGNOSIS — I480 Paroxysmal atrial fibrillation: Secondary | ICD-10-CM | POA: Diagnosis present

## 2018-03-25 DIAGNOSIS — I110 Hypertensive heart disease with heart failure: Principal | ICD-10-CM | POA: Diagnosis present

## 2018-03-25 DIAGNOSIS — Z87891 Personal history of nicotine dependence: Secondary | ICD-10-CM

## 2018-03-25 DIAGNOSIS — Z66 Do not resuscitate: Secondary | ICD-10-CM | POA: Diagnosis present

## 2018-03-25 DIAGNOSIS — J449 Chronic obstructive pulmonary disease, unspecified: Secondary | ICD-10-CM | POA: Diagnosis present

## 2018-03-25 DIAGNOSIS — R0602 Shortness of breath: Secondary | ICD-10-CM | POA: Diagnosis not present

## 2018-03-25 DIAGNOSIS — J9621 Acute and chronic respiratory failure with hypoxia: Secondary | ICD-10-CM | POA: Diagnosis present

## 2018-03-25 DIAGNOSIS — E039 Hypothyroidism, unspecified: Secondary | ICD-10-CM | POA: Diagnosis present

## 2018-03-25 DIAGNOSIS — R062 Wheezing: Secondary | ICD-10-CM | POA: Diagnosis not present

## 2018-03-25 DIAGNOSIS — Z79899 Other long term (current) drug therapy: Secondary | ICD-10-CM

## 2018-03-25 DIAGNOSIS — J811 Chronic pulmonary edema: Secondary | ICD-10-CM | POA: Diagnosis not present

## 2018-03-25 DIAGNOSIS — Z888 Allergy status to other drugs, medicaments and biological substances status: Secondary | ICD-10-CM | POA: Diagnosis not present

## 2018-03-25 DIAGNOSIS — J90 Pleural effusion, not elsewhere classified: Secondary | ICD-10-CM | POA: Diagnosis not present

## 2018-03-25 DIAGNOSIS — J810 Acute pulmonary edema: Secondary | ICD-10-CM

## 2018-03-25 DIAGNOSIS — M8580 Other specified disorders of bone density and structure, unspecified site: Secondary | ICD-10-CM | POA: Diagnosis present

## 2018-03-25 DIAGNOSIS — Z7901 Long term (current) use of anticoagulants: Secondary | ICD-10-CM

## 2018-03-25 DIAGNOSIS — M1990 Unspecified osteoarthritis, unspecified site: Secondary | ICD-10-CM | POA: Diagnosis present

## 2018-03-25 DIAGNOSIS — Z8551 Personal history of malignant neoplasm of bladder: Secondary | ICD-10-CM | POA: Diagnosis not present

## 2018-03-25 DIAGNOSIS — Z9981 Dependence on supplemental oxygen: Secondary | ICD-10-CM

## 2018-03-25 DIAGNOSIS — R000 Tachycardia, unspecified: Secondary | ICD-10-CM | POA: Diagnosis not present

## 2018-03-25 DIAGNOSIS — D72829 Elevated white blood cell count, unspecified: Secondary | ICD-10-CM | POA: Diagnosis present

## 2018-03-25 DIAGNOSIS — I5033 Acute on chronic diastolic (congestive) heart failure: Secondary | ICD-10-CM | POA: Diagnosis present

## 2018-03-25 DIAGNOSIS — I10 Essential (primary) hypertension: Secondary | ICD-10-CM | POA: Diagnosis not present

## 2018-03-25 DIAGNOSIS — N390 Urinary tract infection, site not specified: Secondary | ICD-10-CM | POA: Diagnosis not present

## 2018-03-25 DIAGNOSIS — R0902 Hypoxemia: Secondary | ICD-10-CM | POA: Diagnosis not present

## 2018-03-25 DIAGNOSIS — E034 Atrophy of thyroid (acquired): Secondary | ICD-10-CM | POA: Diagnosis not present

## 2018-03-25 LAB — I-STAT CHEM 8, ED
BUN: 34 mg/dL — ABNORMAL HIGH (ref 8–23)
CREATININE: 1 mg/dL (ref 0.44–1.00)
Calcium, Ion: 1.16 mmol/L (ref 1.15–1.40)
Chloride: 104 mmol/L (ref 98–111)
GLUCOSE: 175 mg/dL — AB (ref 70–99)
HEMATOCRIT: 40 % (ref 36.0–46.0)
HEMOGLOBIN: 13.6 g/dL (ref 12.0–15.0)
Potassium: 4.9 mmol/L (ref 3.5–5.1)
Sodium: 139 mmol/L (ref 135–145)
TCO2: 28 mmol/L (ref 22–32)

## 2018-03-25 LAB — PROTIME-INR
INR: 1.32
Prothrombin Time: 16.3 seconds — ABNORMAL HIGH (ref 11.4–15.2)

## 2018-03-25 LAB — MRSA PCR SCREENING: MRSA by PCR: NEGATIVE

## 2018-03-25 LAB — CBC WITH DIFFERENTIAL/PLATELET
Abs Immature Granulocytes: 0.1 10*3/uL (ref 0.0–0.1)
Basophils Absolute: 0.1 10*3/uL (ref 0.0–0.1)
Basophils Relative: 1 %
EOS ABS: 0.2 10*3/uL (ref 0.0–0.7)
EOS PCT: 1 %
HEMATOCRIT: 43.8 % (ref 36.0–46.0)
HEMOGLOBIN: 12.5 g/dL (ref 12.0–15.0)
Immature Granulocytes: 0 %
LYMPHS ABS: 1.5 10*3/uL (ref 0.7–4.0)
LYMPHS PCT: 7 %
MCH: 27.4 pg (ref 26.0–34.0)
MCHC: 28.5 g/dL — AB (ref 30.0–36.0)
MCV: 96.1 fL (ref 78.0–100.0)
MONO ABS: 1 10*3/uL (ref 0.1–1.0)
MONOS PCT: 5 %
Neutro Abs: 18.1 10*3/uL — ABNORMAL HIGH (ref 1.7–7.7)
Neutrophils Relative %: 86 %
Platelets: 260 10*3/uL (ref 150–400)
RBC: 4.56 MIL/uL (ref 3.87–5.11)
RDW: 15.4 % (ref 11.5–15.5)
WBC: 20.9 10*3/uL — AB (ref 4.0–10.5)

## 2018-03-25 LAB — I-STAT ARTERIAL BLOOD GAS, ED
Acid-Base Excess: 1 mmol/L (ref 0.0–2.0)
BICARBONATE: 25.2 mmol/L (ref 20.0–28.0)
O2 Saturation: 91 %
PO2 ART: 61 mmHg — AB (ref 83.0–108.0)
TCO2: 26 mmol/L (ref 22–32)
pCO2 arterial: 39.7 mmHg (ref 32.0–48.0)
pH, Arterial: 7.411 (ref 7.350–7.450)

## 2018-03-25 LAB — URINALYSIS, COMPLETE (UACMP) WITH MICROSCOPIC
Bilirubin Urine: NEGATIVE
Glucose, UA: NEGATIVE mg/dL
Hgb urine dipstick: NEGATIVE
KETONES UR: NEGATIVE mg/dL
Nitrite: NEGATIVE
PROTEIN: NEGATIVE mg/dL
Specific Gravity, Urine: 1.005 (ref 1.005–1.030)
pH: 5 (ref 5.0–8.0)

## 2018-03-25 LAB — BASIC METABOLIC PANEL
ANION GAP: 9 (ref 5–15)
BUN: 21 mg/dL (ref 8–23)
CALCIUM: 9 mg/dL (ref 8.9–10.3)
CO2: 28 mmol/L (ref 22–32)
Chloride: 102 mmol/L (ref 98–111)
Creatinine, Ser: 1.11 mg/dL — ABNORMAL HIGH (ref 0.44–1.00)
GFR, EST AFRICAN AMERICAN: 49 mL/min — AB (ref >60)
GFR, EST NON AFRICAN AMERICAN: 42 mL/min — AB (ref >60)
Glucose, Bld: 167 mg/dL — ABNORMAL HIGH (ref 70–99)
Potassium: 3.9 mmol/L (ref 3.5–5.1)
Sodium: 139 mmol/L (ref 135–145)

## 2018-03-25 LAB — I-STAT TROPONIN, ED: TROPONIN I, POC: 0 ng/mL (ref 0.00–0.08)

## 2018-03-25 LAB — BRAIN NATRIURETIC PEPTIDE: B Natriuretic Peptide: 945.3 pg/mL — ABNORMAL HIGH (ref 0.0–100.0)

## 2018-03-25 MED ORDER — SODIUM CHLORIDE 0.9 % IV SOLN
250.0000 mL | INTRAVENOUS | Status: DC | PRN
Start: 2018-03-25 — End: 2018-03-28

## 2018-03-25 MED ORDER — AMIODARONE HCL 200 MG PO TABS
200.0000 mg | ORAL_TABLET | Freq: Every day | ORAL | Status: DC
  Administered 2018-03-25 – 2018-03-28 (×4): 200 mg via ORAL
  Filled 2018-03-25 (×4): qty 1

## 2018-03-25 MED ORDER — LEVOTHYROXINE SODIUM 50 MCG PO TABS
50.0000 ug | ORAL_TABLET | ORAL | Status: DC
Start: 2018-03-25 — End: 2018-03-25

## 2018-03-25 MED ORDER — VITAMIN B-12 1000 MCG PO TABS
1000.0000 ug | ORAL_TABLET | Freq: Every day | ORAL | Status: DC
  Administered 2018-03-25 – 2018-03-28 (×4): 1000 ug via ORAL
  Filled 2018-03-25 (×4): qty 1

## 2018-03-25 MED ORDER — FUROSEMIDE 10 MG/ML IJ SOLN
40.0000 mg | Freq: Once | INTRAMUSCULAR | Status: AC
  Administered 2018-03-25: 40 mg via INTRAVENOUS
  Filled 2018-03-25: qty 4

## 2018-03-25 MED ORDER — ONDANSETRON HCL 4 MG/2ML IJ SOLN: 4.0000 mg | Freq: Four times a day (QID) | INTRAMUSCULAR | Status: DC | PRN

## 2018-03-25 MED ORDER — LEVOTHYROXINE SODIUM 50 MCG PO TABS
50.0000 ug | ORAL_TABLET | ORAL | Status: DC
  Administered 2018-03-25 – 2018-03-27 (×2): 50 ug via ORAL
  Filled 2018-03-25 (×3): qty 1

## 2018-03-25 MED ORDER — METOPROLOL TARTRATE 25 MG PO TABS
12.5000 mg | ORAL_TABLET | Freq: Two times a day (BID) | ORAL | Status: DC
  Administered 2018-03-25 – 2018-03-28 (×4): 12.5 mg via ORAL
  Filled 2018-03-25 (×6): qty 1

## 2018-03-25 MED ORDER — SODIUM CHLORIDE 0.9% FLUSH
3.0000 mL | INTRAVENOUS | Status: DC | PRN
  Administered 2018-03-25: 3 mL via INTRAVENOUS
  Filled 2018-03-25: qty 3

## 2018-03-25 MED ORDER — LEVOTHYROXINE SODIUM 75 MCG PO TABS
75.0000 ug | ORAL_TABLET | ORAL | Status: DC
  Administered 2018-03-26 – 2018-03-28 (×2): 75 ug via ORAL
  Filled 2018-03-25 (×3): qty 1

## 2018-03-25 MED ORDER — SODIUM CHLORIDE 0.9% FLUSH
3.0000 mL | Freq: Two times a day (BID) | INTRAVENOUS | Status: DC
  Administered 2018-03-25 – 2018-03-28 (×7): 3 mL via INTRAVENOUS

## 2018-03-25 MED ORDER — APIXABAN 5 MG PO TABS
5.0000 mg | ORAL_TABLET | Freq: Two times a day (BID) | ORAL | Status: DC
  Administered 2018-03-25 – 2018-03-28 (×7): 5 mg via ORAL
  Filled 2018-03-25 (×7): qty 1

## 2018-03-25 MED ORDER — CEFTRIAXONE SODIUM 1 G IJ SOLR
1.0000 g | INTRAMUSCULAR | Status: DC
  Administered 2018-03-25: 1 g via INTRAVENOUS
  Filled 2018-03-25: qty 10

## 2018-03-25 MED ORDER — ACETAMINOPHEN 325 MG PO TABS
650.0000 mg | ORAL_TABLET | ORAL | Status: DC | PRN
  Administered 2018-03-25: 650 mg via ORAL
  Filled 2018-03-25: qty 2

## 2018-03-25 MED ORDER — FUROSEMIDE 10 MG/ML IJ SOLN
40.0000 mg | Freq: Two times a day (BID) | INTRAMUSCULAR | Status: DC
  Administered 2018-03-25 – 2018-03-28 (×7): 40 mg via INTRAVENOUS
  Filled 2018-03-25 (×8): qty 4

## 2018-03-25 MED ORDER — NITROGLYCERIN 2 % TD OINT
0.5000 [in_us] | TOPICAL_OINTMENT | Freq: Once | TRANSDERMAL | Status: AC
  Administered 2018-03-25: 0.5 [in_us] via TOPICAL
  Filled 2018-03-25: qty 1

## 2018-03-25 MED ORDER — FUROSEMIDE 10 MG/ML IJ SOLN
20.0000 mg | Freq: Once | INTRAMUSCULAR | Status: AC
  Administered 2018-03-25: 20 mg via INTRAVENOUS
  Filled 2018-03-25: qty 2

## 2018-03-25 NOTE — Progress Notes (Addendum)
This note also relates to the following rows which could not be included: Pulse Rate - Cannot attach notes to unvalidated device data Resp - Cannot attach notes to unvalidated device data SpO2 - Cannot attach notes to unvalidated device data  RT made BIPAP changes just before order for ABG was placed. RT also took pt off BIPAP for a short time so that she could swab her mouth. During that time, pt's SpO2 dropped to around 68%. Pt placed back on BIPAP with 100% FiO2. Pt recovered well back to 94%. RT will collect sample after pt has had time to adjust to changes. RT will continue to monitor.

## 2018-03-25 NOTE — Plan of Care (Signed)
Discussed with patient plan of care, floor routine and nothing by mouth while on Bipap with some teach back displayed

## 2018-03-25 NOTE — Progress Notes (Signed)
Pt transported from ED to 2W15 without incidence.

## 2018-03-25 NOTE — ED Triage Notes (Signed)
Pt BIB [facility] for shortness of breath. Pt has missed several doses of lasix. SHOB began approx 1 hour pta. Pt was placed on CPAP by EMS. 1 nitro was given by EMS, 10 mg albuterol, 1mg  atrovent

## 2018-03-25 NOTE — ED Notes (Signed)
Attempted to call report

## 2018-03-25 NOTE — H&P (Signed)
History and Physical    Rebecca Norman DOB: May 11, 1925 DOA: 03/25/2018  PCP: [name], MD   Patient coming from: Home   Chief Complaint: acute SOB, orthopnea   HPI: Rebecca Norman is a 84 y.o. female with medical history significant for hypothyroidism, paroxysmal atrial fibrillation on Eliquis, and chronic diastolic CHF with chronic 3 L/min supplemental oxygen requirement, presenting to the emergency department with acute shortness of breath and orthopnea that woke her from sleep.  Patient reports running out of her Lasix 2 to 3 days ago, but reports that she did not feel particularly dyspneic when she went to bed last night.  She woke with acute shortness of breath, unable to lay flat, and was noted by family to have O2 saturation in the 70s despite increasing her supplemental oxygen from 3 to 5 L/min.  She sat up in a chair, but continued to be short of breath and hypoxic, so family called EMS.  Patient denies any recent fevers, chills, or chest pain.  She has not been coughing.  She was recently started on nitrofurantoin for dysuria.  EMS placed the patient on CPAP, administered nitroglycerin x1, albuterol, Atrovent, and transported her to the ED.  ED Course: Upon arrival to the ED, patient is found to be afebrile, saturating 80%, tachypneic in the 30s, and with vitals otherwise stable.  EKG features of sinus rhythm with LVH and repolarization abnormality.  Chest x-ray is notable for worsened interstitial edema with slight increase in bilateral pleural effusions.  Chemistry panel features and elevated BUN and CBC is notable for leukocytosis to 20,900.  Troponin is undetectable and BNP is elevated to 945.  Patient was placed on BiPAP in the ED, given 1 inch of nitroglycerin ointment, and treated with 60 mg IV Lasix.  She remains hemodynamically stable, remains on BiPAP, reports subjective improvement and appears to have less labored breathing.  She will be admitted for ongoing  evaluation and management.  Review of Systems:  All other systems reviewed and apart from HPI, are negative.  Past Medical History:  Diagnosis Date  . DDD (degenerative disc disease), cervical    severe/  auto fusion c2-c5  . Diverticulosis   . Hemorrhoids   . History of adenomatous polyp of colon    1995  adenomatous polyp/  1997 & 2003  hyperplastic polyp's  . History of bladder cancer urologist-  dr [name]   s/p  TURBT's  . History of cancer of ureter   . Hypertension   . Hypothyroid   . Nocturia   . Osteoarthritis   . Osteopenia     Past Surgical History:  Procedure Laterality Date  . BACK SURGERY    . CARDIOVERSION N/A 08/09/2017   Procedure: CARDIOVERSION;  Surgeon: Larey Dresser, MD;  Location: [facility] ENDOSCOPY;  Service: Cardiovascular;  Laterality: N/A;  . CATARACT EXTRACTION W/ INTRAOCULAR LENS  IMPLANT, BILATERAL Bilateral   . CYSTOSCOPY W/ RETROGRADES  10/03/2011   Procedure: CYSTOSCOPY WITH RETROGRADE PYELOGRAM;  Surgeon: Molli Hazard, MD;  Location: [facility];  Service: Urology;;  . Consuela Mimes W/ RETROGRADES Bilateral 06/17/2016   Procedure: CYSTOSCOPY WITH RETROGRADE PYELOGRAM;  Surgeon: Alexis Frock, MD;  Location: [facility];  Service: Urology;  Laterality: Bilateral;  . CYSTOSCOPY WITH BIOPSY N/A 06/17/2016   Procedure: CYSTOSCOPY WITH BIOPSY AND FULGERATION;  Surgeon: Alexis Frock, MD;  Location: [facility];  Service: Urology;  Laterality: N/A;  . LAMINECTOMY  2009   L3 -  4  . ORIF ANKLE FRACTURE Left 08/28/2017   Procedure: OPEN REDUCTION INTERNAL FIXATION (ORIF) ANKLE FRACTURE;  Surgeon: Shona Needles, MD;  Location: [facility];  Service: Orthopedics;  Laterality: Left;  . TRANSURETHRAL RESECTION OF BLADDER  x3 --  2009; 2010; 06-29-2010  . TRANSURETHRAL RESECTION OF BLADDER TUMOR  10/03/2011   Procedure: TRANSURETHRAL RESECTION OF BLADDER TUMOR (TURBT);  Surgeon: Molli Hazard, MD;  Location:  [facility];  Service: Urology;  Laterality: N/A;     reports that she quit smoking about 12 years ago. Her smoking use included cigarettes. She quit after 10.00 years of use. She has never used smokeless tobacco. She reports that she drinks alcohol. She reports that she does not use drugs.  Allergies  Allergen Reactions  . Flagyl [Metronidazole] Hives  . Lisinopril Cough    Family History  Problem Relation Age of Onset  . Breast cancer Sister   . Stomach cancer Sister   . Hernia Sister 30       HERNIA SURGERY COMPLICATIONS  . Colon cancer Neg Hx      Prior to Admission medications   Medication Sig Start Date End Date Taking? Authorizing Provider  acetaminophen (TYLENOL) 500 MG tablet Take 500 mg by mouth every 6 (six) hours as needed for headache (pain).     [provider]  amiodarone (PACERONE) 200 MG tablet Take 1 tablet (200 mg total) by mouth daily. 12/19/17   Lendon Colonel, NP  apixaban (ELIQUIS) 5 MG TABS tablet Take 1 tablet (5 mg total) by mouth 2 (two) times daily. 07/26/17   Sherran Needs, NP  furosemide (LASIX) 20 MG tablet Take 40 mg (2 tabs) in a.m. And take 20mg  (1 tab) in the afternoon Patient taking differently: Take 120 mg by mouth daily. Take 40 mg ,3 tabs- 120 mg- po daily 02/10/18   Rai, Ripudeep K, MD  levothyroxine (SYNTHROID, LEVOTHROID) 50 MCG tablet Take 50-75 mcg by mouth See admin instructions. Take every other alternating between 41mcg and 35mcg.    [provider]  metoprolol tartrate (LOPRESSOR) 25 MG tablet Take 0.5 tablets (12.5 mg total) by mouth 2 (two) times daily. 02/10/18   Rai, Ripudeep K, MD  ondansetron (ZOFRAN) 4 MG tablet Take 4 mg by mouth every 6 (six) hours as needed for nausea or vomiting.    [provider]  vitamin B-12 (CYANOCOBALAMIN) 1000 MCG tablet Take 1 tablet (1,000 mcg total) by mouth daily. 02/10/18   Mendel Corning, MD    Physical Exam: Vitals:   03/25/18 0315 03/25/18 0330  03/25/18 0345 03/25/18 0425  BP: (!) 144/51 (!) 128/98 (!) 110/48 126/64  Pulse: 64 61 60 64  Resp: (!) 21 (!) 22 (!) 31 17  Temp:      TempSrc:      SpO2: 92% 95% 92% 99%  Weight:      Height:          Constitutional: Tachypneic, increased WOB, no pallor, no diaphoresis  Eyes: PERTLA, lids and conjunctivae normal ENMT: Mucous membranes are moist. Posterior pharynx clear of any exudate or lesions.   Neck: normal, supple, no masses, no thyromegaly Respiratory: Diminished in bases, diffuse rales. No accessory muscle use.  Cardiovascular: S1 & S2 heard, regular rate and rhythm. No extremity edema.  Abdomen: No distension, no tenderness, soft. Bowel sounds normal.  Musculoskeletal: no clubbing / cyanosis. No joint deformity upper and lower extremities.   Skin: no significant rashes, lesions, ulcers. Warm,  dry, well-perfused. Neurologic: CN 2-12 grossly intact. Sensation intact. Moving all extremities.    Labs on Admission: I have personally reviewed following labs and imaging studies  CBC: Recent Labs  Lab 03/25/18 0248 03/25/18 0254  WBC 20.9*  --   NEUTROABS 18.1*  --   HGB 12.5 13.6  HCT 43.8 40.0  MCV 96.1  --   PLT 260  --    Basic Metabolic Panel: Recent Labs  Lab 03/25/18 0254  NA 139  K 4.9  CL 104  GLUCOSE 175*  BUN 34*  CREATININE 1.00   GFR: Estimated Creatinine Clearance: 36.6 mL/min (by C-G formula based on SCr of 1 mg/dL). Liver Function Tests: No results for input(s): AST, ALT, ALKPHOS, BILITOT, PROT, ALBUMIN in the last 168 hours. No results for input(s): LIPASE, AMYLASE in the last 168 hours. No results for input(s): AMMONIA in the last 168 hours. Coagulation Profile: Recent Labs  Lab 03/25/18 0248  INR 1.32   Cardiac Enzymes: No results for input(s): CKTOTAL, CKMB, CKMBINDEX, TROPONINI in the last 168 hours. BNP (last 3 results) No results for input(s): PROBNP in the last 8760 hours. HbA1C: No results for input(s): HGBA1C in the last 72  hours. CBG: No results for input(s): GLUCAP in the last 168 hours. Lipid Profile: No results for input(s): CHOL, HDL, LDLCALC, TRIG, CHOLHDL, LDLDIRECT in the last 72 hours. Thyroid Function Tests: No results for input(s): TSH, T4TOTAL, FREET4, T3FREE, THYROIDAB in the last 72 hours. Anemia Panel: No results for input(s): VITAMINB12, FOLATE, FERRITIN, TIBC, IRON, RETICCTPCT in the last 72 hours. Urine analysis:    Component Value Date/Time   COLORURINE STRAW (A) 02/07/2018 2001   APPEARANCEUR CLEAR 02/07/2018 2001   LABSPEC 1.005 02/07/2018 2001   PHURINE 8.0 02/07/2018 2001   GLUCOSEU NEGATIVE 02/07/2018 2001   HGBUR SMALL (A) 02/07/2018 2001   BILIRUBINUR NEGATIVE 02/07/2018 2001   KETONESUR NEGATIVE 02/07/2018 2001   PROTEINUR NEGATIVE 02/07/2018 2001   UROBILINOGEN 0.2 12/30/2008 1535   NITRITE NEGATIVE 02/07/2018 2001   LEUKOCYTESUR LARGE (A) 02/07/2018 2001   Sepsis Labs: @LABRCNTIP (procalcitonin:4,lacticidven:4) )No results found for this or any previous visit (from the past 240 hour(s)).   Radiological Exams on Admission: Dg Chest Portable 1 View  Result Date: 03/25/2018 CLINICAL DATA:  Dyspnea.  Patient missed several doses of Lasix. EXAM: PORTABLE CHEST 1 VIEW COMPARISON:  02/07/2018 FINDINGS: Stable cardiomegaly with moderate aortic atherosclerosis. Interval increase in pulmonary edema and bilateral pleural effusions since prior. No acute osseous abnormality. IMPRESSION: Worsening of interstitial edema with slight interval increase in bilateral pleural effusions. Stable cardiomegaly with aortic atherosclerosis. Electronically Signed   By: Ashley Royalty M.D.   On: 03/25/2018 03:32    EKG: Independently reviewed. Sinus rhythm, LVH with repolarization abnormality.   Assessment/Plan   1. Acute on chronic diastolic CHF; acute on chronic hypoxic respiratory failure  - Presents with acute SOB and PND after running out of Lasix 2-3 days ago  - Saturating in 70's at home  despite increasing her FiO2 from her usual 3 Lpm to 5 Lpm  - CXR findings consistent with acute CHF  - Treated in ED with BiPAP, Lasix 60 mg IV, and NTG ointment  - Continue diuresis with Lasix 40 mg IV q12h, continue BiPAP for now, continue beta-blocker as tolerated, SLIV, follow daily wt and I/O's    2. UTI  - She has had dysuria, recently started on nitrofurantoin  - Afebrile on admission with leukocytosis to 20,900  - Send urine for  culture, continue treatment with Rocephin for now   3. Paroxysmal atrial fibrillation  - In a sinus rhythm on admission  - CHADS-VASc is at least 34 (age x2, gender, CHF)  - Continue Eliquis and amiodarone    4. Hypothyroidism  - TSH wnl last month  - Continue Synthroid     DVT prophylaxis: Eliquis   Code Status: DNR Family Communication: Daughter updated at bedside Consults called: None Admission status: Inpatient     Vianne Bulls, MD [facility] Pager [phone]  If 7PM-7AM, please contact night-coverage [email or web address] Password [email or web address]  03/25/2018, 5:03 AM

## 2018-03-25 NOTE — Progress Notes (Signed)
@IPLOG @        PROGRESS NOTE                                                                                                                                                                                                             Patient Demographics:    Rebecca Norman, is a 84 y.o. female, DOB - Jan 07, 1925, WYO:[id number]  Admit date - 03/25/2018   Admitting Physician Vianne Bulls, MD  Outpatient Primary MD for the patient is [name], MD  LOS - 0  Chief Complaint  Patient presents with  . Shortness of Breath       Brief Narrative  Rebecca Norman is a 84 y.o. female with medical history significant for hypothyroidism, paroxysmal atrial fibrillation on Eliquis, and chronic diastolic CHF with chronic 3 L/min supplemental oxygen requirement, presenting to the emergency department with acute shortness of breath and orthopnea that woke her from sleep.  Patient reports that she stopped taking Lasix about 3 days prior to admission as she misunderstood her doctor's instruction, she was admitted with CHF initially required CPAP in the ER.   Subjective:    Rebecca Norman today has, No headache, No chest pain, No abdominal pain - No Nausea, No new weakness tingling or numbness, No Cough - improved orthopnea and shortness of breath.   Assessment  & Plan :     1.  Acute hypoxic respiratory failure due to acute on chronic diastolic CHF EF 77% on recent echocardiogram.  This happened as patient stopped taking Lasix in error few days prior to admission, she has responded very well to IV Lasix which will be continued, continue beta-blocker along with salt and fluid restriction.  Continue supplemental oxygen, currently off of CPAP clinically much improved.  Monitor intake, output and electrolytes closely.  2.  Paroxysmal atrial fibrillation [place] vas 2 score is at least 4.  Continue combination of beta-blocker amiodarone and Eliquis.  3.  Hypothyroidism.  Stable TSH continue home dose  Synthroid.  4.  Reactionary leukocytosis.  No UTI, no pneumonia clinically or on x-ray, hold antibiotics and trend.    Diet :  Diet Order           Diet Heart Room service appropriate? Yes; Fluid consistency: Thin; Fluid restriction: 1500 mL Fluid  Diet effective now           Family Communication  : Daughter bedside  Code Status : DNR  Disposition Plan  : Stepdown another day as required BiPAP few hours ago  Consults  :  None  Procedures  :    Echocardiogram March 2019.  - Left ventricle: The cavity size was normal. There was severe concentric hypertrophy. Systolic function was vigorous. The estimated ejection fraction was in the range of 65% to 70%. Wall motion was normal; there were no regional wall motion abnormalities. Doppler parameters are consistent with high ventricular filling pressure. - Aortic valve: There was very mild stenosis. There was trivial regurgitation. Peak velocity (S): 215 cm/s. Mean gradient (S): 11 mm Hg. Valve area (Vmax): 1.94 cm^2. - Mitral valve: Severely calcified annulus. Mildly thickened leaflets . There was mild regurgitation. - Left atrium: The atrium was moderately dilated.   DVT Prophylaxis  : Eliquis  Lab Results  Component Value Date   PLT 260 03/25/2018    Inpatient Medications  Scheduled Meds: . amiodarone  200 mg Oral Daily  . apixaban  5 mg Oral BID  . furosemide  40 mg Intravenous BID  . levothyroxine  50 mcg Oral Q48H   And  . [START ON 03/26/2018] levothyroxine  75 mcg Oral Q48H  . metoprolol tartrate  12.5 mg Oral BID  . sodium chloride flush  3 mL Intravenous Q12H  . vitamin B-12  1,000 mcg Oral Daily   Continuous Infusions: . sodium chloride     PRN Meds:.sodium chloride, acetaminophen, ondansetron (ZOFRAN) IV, sodium chloride flush  Antibiotics  :    Anti-infectives (From admission, onward)   Start     Dose/Rate Route Frequency Ordered Stop   03/25/18 0630  cefTRIAXone (ROCEPHIN) 1 g in sodium chloride 0.9 %  100 mL IVPB  Status:  Discontinued     1 g 200 mL/hr over 30 Minutes Intravenous Every 24 hours 03/25/18 0503 03/25/18 1016         Objective:   Vitals:   03/25/18 0530 03/25/18 0600 03/25/18 0710 03/25/18 0807  BP: (!) 111/46 (!) 120/45 (!) 136/48   Pulse: 61 60 (!) 58 62  Resp: (!) 26 (!) 24 (!) 24 (!) 22  Temp:  98.7 F (37.1 C) 98.8 F (37.1 C)   TempSrc:  Axillary Axillary   SpO2: 93% 100% 96%   Weight:  72.5 kg (159 lb 13.3 oz)    Height:  5\' 3"  (1.6 m)      Wt Readings from Last 3 Encounters:  03/25/18 72.5 kg (159 lb 13.3 oz)  03/15/18 73 kg (161 lb)  03/05/18 71.7 kg (158 lb)     Intake/Output Summary (Last 24 hours) at 03/25/2018 1017 Last data filed at 03/25/2018 0616 Gross per 24 hour  Intake 100 ml  Output 450 ml  Net -350 ml     Physical Exam  Awake Alert, Oriented X 3, No new F.N deficits, Normal affect [place].AT,PERRAL Supple Neck,No JVD, No cervical lymphadenopathy appriciated.  Symmetrical Chest wall movement, Good air movement bilaterally, +ve rales RRR,No Gallops,Rubs or new Murmurs, No Parasternal Heave +ve B.Sounds, Abd Soft, No tenderness, No organomegaly appriciated, No rebound - guarding or rigidity. No Cyanosis, Clubbing , 1+ legedema, No new Rash or bruise      Data Review:    CBC Recent Labs  Lab 03/25/18 0248 03/25/18 0254  WBC 20.9*  --   HGB 12.5 13.6  HCT 43.8 40.0  PLT 260  --   MCV 96.1  --   MCH 27.4  --   MCHC 28.5*  --   RDW 15.4  --   LYMPHSABS 1.5  --   MONOABS 1.0  --   EOSABS 0.2  --  BASOSABS 0.1  --     Chemistries  Recent Labs  Lab 03/25/18 0254 03/25/18 0504  NA 139 139  K 4.9 3.9  CL 104 102  CO2  --  28  GLUCOSE 175* 167*  BUN 34* 21  CREATININE 1.00 1.11*  CALCIUM  --  9.0   ------------------------------------------------------------------------------------------------------------------ No results for input(s): CHOL, HDL, LDLCALC, TRIG, CHOLHDL, LDLDIRECT in the last 72 hours.  No  results found for: HGBA1C ------------------------------------------------------------------------------------------------------------------ No results for input(s): TSH, T4TOTAL, T3FREE, THYROIDAB in the last 72 hours.  Invalid input(s): FREET3 ------------------------------------------------------------------------------------------------------------------ No results for input(s): VITAMINB12, FOLATE, FERRITIN, TIBC, IRON, RETICCTPCT in the last 72 hours.  Coagulation profile Recent Labs  Lab 03/25/18 0248  INR 1.32    No results for input(s): DDIMER in the last 72 hours.  Cardiac Enzymes No results for input(s): CKMB, TROPONINI, MYOGLOBIN in the last 168 hours.  Invalid input(s): CK ------------------------------------------------------------------------------------------------------------------    Component Value Date/Time   BNP 945.3 (H) 03/25/2018 0248    Micro Results Recent Results (from the past 240 hour(s))  MRSA PCR Screening     Status: None   Collection Time: 03/25/18  6:12 AM  Result Value Ref Range Status   MRSA by PCR NEGATIVE NEGATIVE Final    Comment:        The GeneXpert MRSA Assay (FDA approved for NASAL specimens only), is one component of a comprehensive MRSA colonization surveillance program. It is not intended to diagnose MRSA infection nor to guide or monitor treatment for MRSA infections. Performed at [facility], [place] [address] [place], [place] [address]     Radiology Reports Dg Chest Portable 1 View  Result Date: 03/25/2018 CLINICAL DATA:  Dyspnea.  Patient missed several doses of Lasix. EXAM: PORTABLE CHEST 1 VIEW COMPARISON:  02/07/2018 FINDINGS: Stable cardiomegaly with moderate aortic atherosclerosis. Interval increase in pulmonary edema and bilateral pleural effusions since prior. No acute osseous abnormality. IMPRESSION: Worsening of interstitial edema with slight interval increase in bilateral pleural effusions. Stable  cardiomegaly with aortic atherosclerosis. Electronically Signed   By: Ashley Royalty M.D.   On: 03/25/2018 03:32    Time Spent in minutes  30   Lala Lund M.D on 03/25/2018 at 10:17 AM  To page go to [email or web address] - password [facility]

## 2018-03-25 NOTE — ED Provider Notes (Signed)
[facility] EMERGENCY DEPARTMENT Provider Note   CSN: [id number] Arrival date & time: 03/25/18  9163     History   Chief Complaint Chief Complaint  Patient presents with  . Shortness of Breath    HPI Rebecca Norman is a 84 y.o. female.  The history is provided by the EMS personnel and a relative. The history is limited by the condition of the patient.  Shortness of Breath  This is a recurrent problem. The problem occurs continuously.The current episode started 2 days ago. The problem has been rapidly worsening. Associated symptoms include wheezing and orthopnea. Pertinent negatives include no fever. The problem's precipitants include medical treatment. Risk factors: CHF off meds for 2-3 days. She has tried nothing for the symptoms. The treatment provided no relief.  Patient with COPD and CHF has missed 2-3 days of lasix.  Is normally on 3 L [place] at home was in 67s on same.  No CP no n/v/d.  No f/c/r.    Past Medical History:  Diagnosis Date  . DDD (degenerative disc disease), cervical    severe/  auto fusion c2-c5  . Diverticulosis   . Hemorrhoids   . History of adenomatous polyp of colon    1995  adenomatous polyp/  1997 & 2003  hyperplastic polyp's  . History of bladder cancer urologist-  dr [name]   s/p  TURBT's  . History of cancer of ureter   . Hypertension   . Hypothyroid   . Nocturia   . Osteoarthritis   . Osteopenia     Patient Active Problem List   Diagnosis Date Noted  . Acute on chronic respiratory failure with hypoxia ([place]) 03/25/2018  . Leukocytosis 03/25/2018  . Acute on chronic diastolic CHF (congestive heart failure) ([place]) 03/25/2018  . B12 deficiency 02/18/2018  . Acute lower UTI 02/07/2018  . Acute on chronic diastolic (congestive) heart failure ([place]) 11/21/2017  . Paroxysmal atrial fibrillation ([place]) 11/21/2017  . Cardiorenal syndrome with renal failure 11/15/2017  . Chronic diastolic CHF (congestive heart failure) ([place])  08/25/2017  . Current use of long term anticoagulation 08/11/2017  . Hypothyroid 06/13/2017  . Palpitations 06/13/2017  . Heart murmur 06/13/2017  . S/P laminectomy 07/10/2013  . HTN (hypertension) 07/10/2013  . BLADDER CANCER 12/17/2008  . DIVERTICULOSIS OF COLON 12/12/2008  . COLONIC POLYPS, HYPERPLASTIC, HX OF 12/12/2008    Past Surgical History:  Procedure Laterality Date  . BACK SURGERY    . CARDIOVERSION N/A 08/09/2017   Procedure: CARDIOVERSION;  Surgeon: Larey Dresser, MD;  Location[facility] ENDOSCOPY;  Service: Cardiovascular;  Laterality: N/A;  . CATARACT EXTRACTION W/ INTRAOCULAR LENS  IMPLANT, BILATERAL Bilateral   . CYSTOSCOPY W/ RETROGRADES  10/03/2011   Procedure: CYSTOSCOPY WITH RETROGRADE PYELOGRAM;  Surgeon: Molli Hazard, MD;  Location[facility];  Service: Urology;;  . Consuela Mimes W/ RETROGRADES Bilateral 06/17/2016   Procedure: CYSTOSCOPY WITH RETROGRADE PYELOGRAM;  Surgeon: Alexis Frock, MD;  Location: [facility];  Service: Urology;  Laterality: Bilateral;  . CYSTOSCOPY WITH BIOPSY N/A 06/17/2016   Procedure: CYSTOSCOPY WITH BIOPSY AND FULGERATION;  Surgeon: Alexis Frock, MD;  Location[facility];  Service: Urology;  Laterality: N/A;  . LAMINECTOMY  2009   L3 - 4  . ORIF ANKLE FRACTURE Left 08/28/2017   Procedure: OPEN REDUCTION INTERNAL FIXATION (ORIF) ANKLE FRACTURE;  Surgeon: Shona Needles, MD;  Location: [facility] ORS;  Service: Orthopedics;  Laterality: Left;  . TRANSURETHRAL RESECTION OF BLADDER  x3 --  2009; 2010; 06-29-2010  . TRANSURETHRAL RESECTION OF BLADDER TUMOR  10/03/2011   Procedure: TRANSURETHRAL RESECTION OF BLADDER TUMOR (TURBT);  Surgeon: Molli Hazard, MD;  Location: [facility];  Service: Urology;  Laterality: N/A;     OB History   None      Home Medications    Prior to Admission medications   Medication Sig Start Date End Date Taking? Authorizing Provider    acetaminophen (TYLENOL) 500 MG tablet Take 500 mg by mouth every 6 (six) hours as needed for headache (pain).     [provider]  amiodarone (PACERONE) 200 MG tablet Take 1 tablet (200 mg total) by mouth daily. 12/19/17   Lendon Colonel, NP  apixaban (ELIQUIS) 5 MG TABS tablet Take 1 tablet (5 mg total) by mouth 2 (two) times daily. 07/26/17   Sherran Needs, NP  furosemide (LASIX) 20 MG tablet Take 40 mg (2 tabs) in a.m. And take 20mg  (1 tab) in the afternoon Patient taking differently: Take 120 mg by mouth daily. Take 40 mg ,3 tabs- 120 mg- po daily 02/10/18   Rai, Ripudeep K, MD  levothyroxine (SYNTHROID, LEVOTHROID) 50 MCG tablet Take 50-75 mcg by mouth See admin instructions. Take every other alternating between 46mcg and 61mcg.    [provider]  metoprolol tartrate (LOPRESSOR) 25 MG tablet Take 0.5 tablets (12.5 mg total) by mouth 2 (two) times daily. 02/10/18   Rai, Ripudeep K, MD  ondansetron (ZOFRAN) 4 MG tablet Take 4 mg by mouth every 6 (six) hours as needed for nausea or vomiting.    [provider]  vitamin B-12 (CYANOCOBALAMIN) 1000 MCG tablet Take 1 tablet (1,000 mcg total) by mouth daily. 02/10/18   Mendel Corning, MD    Family History Family History  Problem Relation Age of Onset  . Breast cancer Sister   . Stomach cancer Sister   . Hernia Sister 98       HERNIA SURGERY COMPLICATIONS  . Colon cancer Neg Hx     Social History Social History   Tobacco Use  . Smoking status: Former Smoker    Years: 10.00    Types: Cigarettes    Last attempt to quit: 09/25/2005    Years since quitting: 12.5  . Smokeless tobacco: Never Used  Substance Use Topics  . Alcohol use: Yes  . Drug use: No     Allergies   Flagyl [metronidazole] and Lisinopril   Review of Systems Review of Systems  Unable to perform ROS: Acuity of condition  Constitutional: Negative for diaphoresis and fever.  Respiratory: Positive for shortness of breath and wheezing.    Cardiovascular: Positive for orthopnea. Negative for palpitations.     Physical Exam Updated Vital Signs BP 126/64   Pulse 64   Temp 98 F (36.7 C) (Temporal)   Resp 17   Ht 5\' 4"  (1.626 m)   Wt 73 kg (161 lb)   SpO2 99%   BMI 27.64 kg/m   Physical Exam  Constitutional: She appears well-developed and well-nourished. She appears distressed.  HENT:  Head: Normocephalic and atraumatic.  Mouth/Throat: No oropharyngeal exudate.  Eyes: Pupils are equal, round, and reactive to light. Conjunctivae are normal.  Neck: No tracheal deviation present.  Cardiovascular: Normal rate, regular rhythm, normal heart sounds and intact distal pulses.  Pulmonary/Chest: Tachypnea noted. She is in respiratory distress. She has wheezes. She has rales.  Abdominal: Soft. Bowel sounds are normal. She exhibits no mass. There  is no tenderness. There is no guarding.  Musculoskeletal: Normal range of motion.  Neurological: She is alert. She displays normal reflexes.  Skin: Skin is warm and dry. Capillary refill takes less than 2 seconds.  Psychiatric:  unable     ED Treatments / Results  Labs (all labs ordered are listed, but only abnormal results are displayed) Labs Reviewed  CBC WITH DIFFERENTIAL/PLATELET - Abnormal; Notable for the following components:      Result Value   WBC 20.9 (*)    MCHC 28.5 (*)    Neutro Abs 18.1 (*)    All other components within normal limits  BRAIN NATRIURETIC PEPTIDE - Abnormal; Notable for the following components:   B Natriuretic Peptide 945.3 (*)    All other components within normal limits  PROTIME-INR - Abnormal; Notable for the following components:   Prothrombin Time 16.3 (*)    All other components within normal limits  I-STAT CHEM 8, ED - Abnormal; Notable for the following components:   BUN 34 (*)    Glucose, Bld 175 (*)    All other components within normal limits  URINE CULTURE  BLOOD GAS, ARTERIAL  URINALYSIS, COMPLETE (UACMP) WITH MICROSCOPIC   BASIC METABOLIC PANEL  I-STAT TROPONIN, ED    EKG  EKG Interpretation  Date/Time:  Sunday March 25 2018 02:43:31 EDT Ventricular Rate:  79 PR Interval:    QRS Duration: 106 QT Interval:  344 QTC Calculation: 395 R Axis:   61 Text Interpretation:  Sinus rhythm Probable LVH with secondary repol abnrm Confirmed by Dory Horn) on 03/25/2018 5:18:23 AM       Radiology Dg Chest Portable 1 View  Result Date: 03/25/2018 CLINICAL DATA:  Dyspnea.  Patient missed several doses of Lasix. EXAM: PORTABLE CHEST 1 VIEW COMPARISON:  02/07/2018 FINDINGS: Stable cardiomegaly with moderate aortic atherosclerosis. Interval increase in pulmonary edema and bilateral pleural effusions since prior. No acute osseous abnormality. IMPRESSION: Worsening of interstitial edema with slight interval increase in bilateral pleural effusions. Stable cardiomegaly with aortic atherosclerosis. Electronically Signed   By: Ashley Royalty M.D.   On: 03/25/2018 03:32    Procedures Procedures (including critical care time)  Medications Ordered in ED Medications  amiodarone (PACERONE) tablet 200 mg (has no administration in time range)  apixaban (ELIQUIS) tablet 5 mg (has no administration in time range)  levothyroxine (SYNTHROID, LEVOTHROID) tablet 50-75 mcg (has no administration in time range)  metoprolol tartrate (LOPRESSOR) tablet 12.5 mg (has no administration in time range)  vitamin B-12 (CYANOCOBALAMIN) tablet 1,000 mcg (has no administration in time range)  sodium chloride flush (NS) 0.9 % injection 3 mL (has no administration in time range)  sodium chloride flush (NS) 0.9 % injection 3 mL (has no administration in time range)  0.9 %  sodium chloride infusion (has no administration in time range)  acetaminophen (TYLENOL) tablet 650 mg (has no administration in time range)  ondansetron (ZOFRAN) injection 4 mg (has no administration in time range)  furosemide (LASIX) injection 40 mg (has no administration  in time range)  cefTRIAXone (ROCEPHIN) 1 g in sodium chloride 0.9 % 100 mL IVPB (has no administration in time range)  furosemide (LASIX) injection 40 mg (40 mg Intravenous Given 03/25/18 0328)  nitroGLYCERIN (NITROGLYN) 2 % ointment 0.5 inch (0.5 inches Topical Given 03/25/18 0328)  furosemide (LASIX) injection 20 mg (20 mg Intravenous Given 03/25/18 0438)     BIPAP initiated by me immediately   MDM Reviewed: previous chart, nursing note and vitals Interpretation:  labs, ECG and x-ray (pulmonary edema with effusions by me, elevated BNP ) Total time providing critical care: > 105 minutes (bipap initiated by me). This excludes time spent performing separately reportable procedures and services. Consults: admitting MD  CRITICAL CARE Performed by: Carlisle Beers Total critical care time: 120 minutes Critical care time was exclusive of separately billable procedures and treating other patients. Critical care was necessary to treat or prevent imminent or life-threatening deterioration. Critical care was time spent personally by me on the following activities: development of treatment plan with patient and/or surrogate as well as nursing, discussions with consultants, evaluation of patient's response to treatment, examination of patient, obtaining history from patient or surrogate, ordering and performing treatments and interventions, ordering and review of laboratory studies, ordering and review of radiographic studies, pulse oximetry and re-evaluation of patient's condition.  Final Clinical Impressions(s) / ED Diagnoses   Final diagnoses:  Acute pulmonary edema ([place])    Will admit to step down on the BIPAP    Mayli Covington, MD 03/25/18 8938

## 2018-03-26 DIAGNOSIS — J810 Acute pulmonary edema: Secondary | ICD-10-CM

## 2018-03-26 LAB — CBC
HEMATOCRIT: 30.5 % — AB (ref 36.0–46.0)
Hemoglobin: 9.5 g/dL — ABNORMAL LOW (ref 12.0–15.0)
MCH: 27.6 pg (ref 26.0–34.0)
MCHC: 31.1 g/dL (ref 30.0–36.0)
MCV: 88.7 fL (ref 78.0–100.0)
Platelets: 198 10*3/uL (ref 150–400)
RBC: 3.44 MIL/uL — ABNORMAL LOW (ref 3.87–5.11)
RDW: 15.5 % (ref 11.5–15.5)
WBC: 11.7 10*3/uL — ABNORMAL HIGH (ref 4.0–10.5)

## 2018-03-26 LAB — BASIC METABOLIC PANEL
ANION GAP: 10 (ref 5–15)
BUN: 25 mg/dL — ABNORMAL HIGH (ref 8–23)
CO2: 29 mmol/L (ref 22–32)
CREATININE: 1.21 mg/dL — AB (ref 0.44–1.00)
Calcium: 8.3 mg/dL — ABNORMAL LOW (ref 8.9–10.3)
Chloride: 99 mmol/L (ref 98–111)
GFR calc Af Amer: 44 mL/min — ABNORMAL LOW (ref >60)
GFR calc non Af Amer: 38 mL/min — ABNORMAL LOW (ref >60)
GLUCOSE: 83 mg/dL (ref 70–99)
POTASSIUM: 3.5 mmol/L (ref 3.5–5.1)
Sodium: 138 mmol/L (ref 135–145)

## 2018-03-26 LAB — URINE CULTURE

## 2018-03-26 LAB — MAGNESIUM: Magnesium: 1.8 mg/dL (ref 1.7–2.4)

## 2018-03-26 NOTE — Progress Notes (Signed)
Occupational Therapy Evaluation Patient Details Name: Rebecca Norman MRN: [id number] DOB: Dec 11, 1924 Today's Date: 03/26/2018    History of Present Illness 84 y.o. female with medical history significant for hypothyroidism, paroxysmal atrial fibrillation on Eliquis, and chronic diastolic CHF with chronic 3 L/min supplemental oxygen requirement, presenting to the emergency department with acute shortness of breath and orthopnea that woke her from sleep.  Patient reports that she stopped taking Lasix about 3 days prior to admission as she misunderstood her doctor's instruction, she was admitted with CHF initially required CPAP in the ER.   Clinical Impression   PTA, pt was recently d/c from SNF to home and had not yet begun home health rehab. PTA, pt was ambulating household distances at RW level and was independent with with ADLs and received assistance 3-4 hours per day for cooking, cleaning, and bathing. Pt currently requires minguard during functional mobility and with ADLs. While standing to complete grooming task, SpO2 dropped to 89% O2 on 7lnc. Due to deficits listed below (see OT problem list), pt would benefit from acute OT to address establish goals to facilitate safe D/C home. At this time, recommend 24/7 physical assistance with HHOT follow-up. If 24/7 physical assistance unavailable, recommend SNF follow-up.      Follow Up Recommendations  Home health OT;Supervision/Assistance - 24 hour    Equipment Recommendations  3 in 1 bedside commode    Recommendations for Other Services       Precautions / Restrictions Precautions Precautions: None Restrictions Weight Bearing Restrictions: No      Mobility Bed Mobility               General bed mobility comments: sitting in recliner upon arrival   Transfers Overall transfer level: Needs assistance Equipment used: Rolling walker (2 wheeled) Transfers: Sit to/from Stand Sit to Stand: Min guard         General transfer  comment: min guard for safety, good power up and steadying, pt states she feels "a little shaky"    Balance Overall balance assessment: Needs assistance Sitting-balance support: No upper extremity supported;Feet supported Sitting balance-Leahy Scale: Good     Standing balance support: Bilateral upper extremity supported;Single extremity supported;During functional activity Standing balance-Leahy Scale: Poor Standing balance comment: uses RW for steadying;single UE support while reaching out of base of support, minor instability noted, pt able to self correct                           ADL either performed or assessed with clinical judgement   ADL Overall ADL's : Needs assistance/impaired Eating/Feeding: Set up   Grooming: Min guard;Standing Grooming Details (indicate cue type and reason): pt stood to brush hair Upper Body Bathing: Min guard   Lower Body Bathing: Sit to/from stand;Min guard   Upper Body Dressing : Set up   Lower Body Dressing: Sit to/from stand;Min guard Lower Body Dressing Details (indicate cue type and reason): pt able to sit edge of recliner and adjust socks figure-4 method Toilet Transfer: RW;Minimal assistance   Toileting- Clothing Manipulation and Hygiene: Sit to/from stand;Minimal assistance       Functional mobility during ADLs: Minimal assistance General ADL Comments: Pt able to static stand short durations to complete ADLs, SpO2 began to decline after static standing 2 min     Vision Baseline Vision/History: Wears glasses Wears Glasses: At all times Patient Visual Report: No change from baseline       Perception  Praxis      Pertinent Vitals/Pain Pain Assessment: No/denies pain     Hand Dominance Right   Extremity/Trunk Assessment Upper Extremity Assessment Upper Extremity Assessment: Generalized weakness   Lower Extremity Assessment Lower Extremity Assessment: Defer to PT evaluation   Cervical / Trunk  Assessment Cervical / Trunk Assessment: Kyphotic   Communication Communication Communication: HOH   Cognition Arousal/Alertness: Awake/alert Behavior During Therapy: WFL for tasks assessed/performed Overall Cognitive Status: No family/caregiver present to determine baseline cognitive functioning                                 General Comments: pt appeared agitated when asking more detailed information about prior level stated she "was unsure since she hasn't been home in a while"   General Comments  pt interesting and asking questions about SpO2 levels and impact of 7lnc vs 3lnc has on SpO2, provided education addressing pt's questions;educated pt on pursed lip breathing    Exercises     Shoulder Instructions      Home Living Family/patient expects to be discharged to:: Private residence Living Arrangements: Alone Available Help at Discharge: Family;Available PRN/intermittently(daughter) Type of Home: House Home Access: Level entry     Home Layout: One level     Bathroom Shower/Tub: Occupational psychologist: Handicapped height Bathroom Accessibility: Yes   Home Equipment: Environmental consultant - 2 wheels;Bedside commode;Hand held shower head          Prior Functioning/Environment Level of Independence: Needs assistance  Gait / Transfers Assistance Needed: uses RW for ambulation ADL's / Homemaking Assistance Needed: household help 3-4 hrs (cooking, cleaning, and bathing)            OT Problem List: Impaired balance (sitting and/or standing);Decreased activity tolerance;Decreased safety awareness;Decreased knowledge of precautions;Cardiopulmonary status limiting activity;Decreased knowledge of use of DME or AE      OT Treatment/Interventions: Self-care/ADL training;Therapeutic exercise;Energy conservation;DME and/or AE instruction;Patient/family education;Therapeutic activities;Balance training    OT Goals(Current goals can be found in the care plan  section) Acute Rehab OT Goals Patient Stated Goal: to go home OT Goal Formulation: With patient Time For Goal Achievement: 04/09/18 Potential to Achieve Goals: Good  OT Frequency: Min 2X/week   Barriers to D/C: Decreased caregiver support  pt requires 24/7 physical assist to d/c home       Co-evaluation              AM-PAC PT "6 Clicks" Daily Activity     Outcome Measure Help from another person eating meals?: None Help from another person taking care of personal grooming?: A Little Help from another person toileting, which includes using toliet, bedpan, or urinal?: A Little Help from another person bathing (including washing, rinsing, drying)?: A Little Help from another person to put on and taking off regular upper body clothing?: A Little Help from another person to put on and taking off regular lower body clothing?: A Little 6 Click Score: 19   End of Session Equipment Utilized During Treatment: Gait belt;Rolling walker;Oxygen Nurse Communication: Mobility status  Activity Tolerance: Patient tolerated treatment well Patient left: in chair;with call bell/phone within reach  OT Visit Diagnosis: Unsteadiness on feet (R26.81);Other abnormalities of gait and mobility (R26.89);Muscle weakness (generalized) (M62.81);History of falling (Z91.81)                Time: 3419-6222 OT Time Calculation (min): 29 min Charges:    G-Codes:  Dorinda Hill OTS    Dorinda Hill 03/26/2018, 3:46 PM

## 2018-03-26 NOTE — Progress Notes (Signed)
Pt currently on HFNC- BIPAP prn if needed.

## 2018-03-26 NOTE — Progress Notes (Signed)
PROGRESS NOTE    Rebecca Norman  MEQ:[id number] DOB: [birth year] DOA: 03/25/2018 PCP: [name], MD    Brief Narrative:  84 y.o.femalewith medical history significant forhypothyroidism, paroxysmal atrial fibrillation on Eliquis, and chronic diastolic CHF with chronic 3 L/min supplemental oxygen requirement, presenting to the emergency department with acute shortness of breath and orthopnea that woke her from sleep. Patient reports that she stopped taking Lasix about 3 days prior to admission as she misunderstood her doctor's instruction, she was admitted with CHF initially required CPAP in the ER  Assessment & Plan:   Principal Problem:   Acute on chronic diastolic (congestive) heart failure (HCC) Active Problems:   HTN (hypertension)   Hypothyroid   Paroxysmal atrial fibrillation (HCC)   Acute lower UTI   Acute on chronic respiratory failure with hypoxia (HCC)   Acute on chronic diastolic CHF (congestive heart failure) ([place])  1.  Acute hypoxic respiratory failure due to acute on chronic diastolic CHF EF 29% on recent echocardiogram.   -likely secondary to inadvertantly taking diuretic prior to admission -Improvement noted with IV lasix -Continue daily weights and I/o -Wean O2 to goal of 3LNC at baseline   -Repeat bmet in AM  2.  Paroxysmal atrial fibrillation [place] vas 2 score is at least 4.   -Will continue with beta-blocker amiodarone and Eliquis. -Currently rate controlled  3.  Hypothyroidism.   -Thus far remains stable  -Will continue with home dose Synthroid.  4.  Reactionary leukocytosis.   -Labs reviewed. No evidence of UTI or PNA -Patient afebrile -Repeat wbc much improved -Repeat cbc in AM  DVT prophylaxis: eliquis Code Status: DNR Family Communication: Pt in room, family not at bedside Disposition Plan: Uncertain at this time  Consultants:     Procedures:     Antimicrobials: Anti-infectives (From admission, onward)   Start      Dose/Rate Route Frequency Ordered Stop   03/25/18 0630  cefTRIAXone (ROCEPHIN) 1 g in sodium chloride 0.9 % 100 mL IVPB  Status:  Discontinued     1 g 200 mL/hr over 30 Minutes Intravenous Every 24 hours 03/25/18 0503 03/25/18 1016       Subjective: Feels better today  Objective: Vitals:   03/25/18 1929 03/26/18 0327 03/26/18 0802 03/26/18 0806  BP: (!) 104/37 (!) 103/35 (!) 103/43 (!) 103/43  Pulse: 62 (!) 53 62 61  Resp: 15 (!) 22 (!) 21 14  Temp: (!) 97.5 F (36.4 C) 97.8 F (36.6 C) 97.7 F (36.5 C)   TempSrc: Oral Oral Oral   SpO2: 94% 95% 96% 96%  Weight:      Height:        Intake/Output Summary (Last 24 hours) at 03/26/2018 1207 Last data filed at 03/26/2018 0804 Gross per 24 hour  Intake 360 ml  Output 1900 ml  Net -1540 ml   Filed Weights   03/25/18 0244 03/25/18 0600  Weight: 73 kg (161 lb) 72.5 kg (159 lb 13.3 oz)    Examination:  General exam: Appears calm and comfortable  Respiratory system: Clear to auscultation. Respiratory effort normal. Cardiovascular system: S1 & S2 heard, RRR Gastrointestinal system: Abdomen is nondistended, soft and nontender. No organomegaly or masses felt. Normal bowel sounds heard. Central nervous system: Alert and oriented. No focal neurological deficits. Extremities: Symmetric 5 x 5 power. Skin: No rashes, lesions Psychiatry: Judgement and insight appear normal. Mood & affect appropriate.   Data Reviewed: I have personally reviewed following labs and imaging studies  CBC: Recent Labs  Lab 03/25/18 0248 03/25/18 0254 03/26/18 0235  WBC 20.9*  --  11.7*  NEUTROABS 18.1*  --   --   HGB 12.5 13.6 9.5*  HCT 43.8 40.0 30.5*  MCV 96.1  --  88.7  PLT 260  --  161   Basic Metabolic Panel: Recent Labs  Lab 03/25/18 0254 03/25/18 0504 03/26/18 0235  NA 139 139 138  K 4.9 3.9 3.5  CL 104 102 99  CO2  --  28 29  GLUCOSE 175* 167* 83  BUN 34* 21 25*  CREATININE 1.00 1.11* 1.21*  CALCIUM  --  9.0 8.3*  MG  --    --  1.8   GFR: Estimated Creatinine Clearance: 29.5 mL/min (A) (by C-G formula based on SCr of 1.21 mg/dL (H)). Liver Function Tests: No results for input(s): AST, ALT, ALKPHOS, BILITOT, PROT, ALBUMIN in the last 168 hours. No results for input(s): LIPASE, AMYLASE in the last 168 hours. No results for input(s): AMMONIA in the last 168 hours. Coagulation Profile: Recent Labs  Lab 03/25/18 0248  INR 1.32   Cardiac Enzymes: No results for input(s): CKTOTAL, CKMB, CKMBINDEX, TROPONINI in the last 168 hours. BNP (last 3 results) No results for input(s): PROBNP in the last 8760 hours. HbA1C: No results for input(s): HGBA1C in the last 72 hours. CBG: No results for input(s): GLUCAP in the last 168 hours. Lipid Profile: No results for input(s): CHOL, HDL, LDLCALC, TRIG, CHOLHDL, LDLDIRECT in the last 72 hours. Thyroid Function Tests: No results for input(s): TSH, T4TOTAL, FREET4, T3FREE, THYROIDAB in the last 72 hours. Anemia Panel: No results for input(s): VITAMINB12, FOLATE, FERRITIN, TIBC, IRON, RETICCTPCT in the last 72 hours. Sepsis Labs: No results for input(s): PROCALCITON, LATICACIDVEN in the last 168 hours.  Recent Results (from the past 240 hour(s))  Urine Culture     Status: Abnormal   Collection Time: 03/25/18  5:17 AM  Result Value Ref Range Status   Specimen Description URINE, RANDOM  Final   Special Requests NONE  Final   Culture (A)  Final    <10,000 COLONIES/mL INSIGNIFICANT GROWTH Performed at [facility], [address] [place], [place] [address]    Report Status 03/26/2018 FINAL  Final  MRSA PCR Screening     Status: None   Collection Time: 03/25/18  6:12 AM  Result Value Ref Range Status   MRSA by PCR NEGATIVE NEGATIVE Final    Comment:        The GeneXpert MRSA Assay (FDA approved for NASAL specimens only), is one component of a comprehensive MRSA colonization surveillance program. It is not intended to diagnose MRSA infection nor to guide  or monitor treatment for MRSA infections. Performed at [facility], [place] [address] [place], [place] [address]      Radiology Studies: Dg Chest Portable 1 View  Result Date: 03/25/2018 CLINICAL DATA:  Dyspnea.  Patient missed several doses of Lasix. EXAM: PORTABLE CHEST 1 VIEW COMPARISON:  02/07/2018 FINDINGS: Stable cardiomegaly with moderate aortic atherosclerosis. Interval increase in pulmonary edema and bilateral pleural effusions since prior. No acute osseous abnormality. IMPRESSION: Worsening of interstitial edema with slight interval increase in bilateral pleural effusions. Stable cardiomegaly with aortic atherosclerosis. Electronically Signed   By: Ashley Royalty M.D.   On: 03/25/2018 03:32    Scheduled Meds: . amiodarone  200 mg Oral Daily  . apixaban  5 mg Oral BID  . furosemide  40 mg Intravenous BID  . levothyroxine  50 mcg  Oral Q48H   And  . levothyroxine  75 mcg Oral Q48H  . metoprolol tartrate  12.5 mg Oral BID  . sodium chloride flush  3 mL Intravenous Q12H  . vitamin B-12  1,000 mcg Oral Daily   Continuous Infusions: . sodium chloride       LOS: 1 day   Marylu Lund, MD [facility] Pager [phone]  If 7PM-7AM, please contact night-coverage [email or web address] Password [facility] 03/26/2018, 12:07 PM

## 2018-03-26 NOTE — Evaluation (Signed)
Physical Therapy Evaluation Patient Details Name: Rebecca Norman MRN: [id number] DOB: 11/10/1924 Today's Date: 03/26/2018   History of Present Illness  84 y.o. female with medical history significant for hypothyroidism, paroxysmal atrial fibrillation on Eliquis, and chronic diastolic CHF with chronic 3 L/min supplemental oxygen requirement, presenting to the emergency department with acute shortness of breath and orthopnea that woke her from sleep.  Patient reports that she stopped taking Lasix about 3 days prior to admission as she misunderstood her doctor's instruction, she was admitted with CHF initially required CPAP in the ER.  Clinical Impression  PTA pt had just returned from rehabbing at a SNF and she had not yet started with HHPT. Pt has help 3-4 hours a day for cooking, cleaning and bathing. Pt ambulates household distances with RW. Pt currently min guard for transfers and ambulation of 10 feet with RW. During ambulation on 8L O2 pt SaO2 dropped to 89% O2. Pt would really prefer to not go back to a SNF facility, and she could go home with HHPT if she had 24 hr assist to aid in monitoring oxygen levels. PT will continue to follow acutely.     Follow Up Recommendations Home health PT;Supervision/Assistance - 24 hour    Equipment Recommendations  None recommended by PT    Recommendations for Other Services OT consult     Precautions / Restrictions Precautions Precautions: None Restrictions Weight Bearing Restrictions: No      Mobility  Bed Mobility               General bed mobility comments: OOB on entry  Transfers Overall transfer level: Needs assistance Equipment used: Rolling walker (2 wheeled) Transfers: Sit to/from Stand Sit to Stand: Min guard         General transfer comment: min guard for safety, good power up and steadying, pt states she feels "a little uneasy"  Ambulation/Gait Ambulation/Gait assistance: Min guard Gait Distance (Feet): 10  Feet Assistive device: Rolling walker (2 wheeled) Gait Pattern/deviations: Step-through pattern;Trunk flexed;Decreased stride length Gait velocity: slowed Gait velocity interpretation: <1.31 ft/sec, indicative of household ambulator General Gait Details: min guard for safety, slow, steady gait, no LoB, vc for proximity to walker and upright posturePt be came incontinent of urine during ambulation and returned to recliner        Balance Overall balance assessment: Needs assistance Sitting-balance support: No upper extremity supported;Feet supported Sitting balance-Leahy Scale: Good     Standing balance support: Bilateral upper extremity supported;Single extremity supported Standing balance-Leahy Scale: Poor Standing balance comment: uses RW for steadying                             Pertinent Vitals/Pain Pain Assessment: No/denies pain    Home Living Family/patient expects to be discharged to:: Private residence Living Arrangements: Alone Available Help at Discharge: Family;Available PRN/intermittently(daughter) Type of Home: House Home Access: Level entry     Home Layout: One level Home Equipment: Walker - 2 wheels;Bedside commode;Hand held shower head      Prior Function Level of Independence: Needs assistance   Gait / Transfers Assistance Needed: uses RW for ambulation  ADL's / Homemaking Assistance Needed: household help 3-4 hrs (cooking, cleaning, and bathing)           Extremity/Trunk Assessment   Upper Extremity Assessment Upper Extremity Assessment: Generalized weakness    Lower Extremity Assessment Lower Extremity Assessment: RLE deficits/detail;LLE deficits/detail RLE Deficits / Details: AROM WFL, strength grossly  4/5 LLE Deficits / Details: AROM WFL, Strength grossly 4/5    Cervical / Trunk Assessment Cervical / Trunk Assessment: Kyphotic  Communication   Communication: HOH  Cognition Arousal/Alertness: Awake/alert Behavior During  Therapy: WFL for tasks assessed/performed Overall Cognitive Status: No family/caregiver present to determine baseline cognitive functioning                                        General Comments General comments (skin integrity, edema, etc.): sitting in recliner HR 46 bpm, SaO2 on 7L O2 via HFNC 93%O2, BP 120/53, with ambulation on 8L O2, SaO2 dropped to 89%O2, HR max 52 bpm        Assessment/Plan    PT Assessment Patient needs continued PT services  PT Problem List Decreased balance;Decreased mobility;Decreased safety awareness;Cardiopulmonary status limiting activity       PT Treatment Interventions DME instruction;Gait training;Functional mobility training;Therapeutic activities;Therapeutic exercise;Balance training;Cognitive remediation;Patient/family education    PT Goals (Current goals can be found in the Care Plan section)  Acute Rehab PT Goals Patient Stated Goal: go home and not to rehab PT Goal Formulation: With patient Time For Goal Achievement: 04/09/18 Potential to Achieve Goals: Fair    Frequency Min 3X/week   Barriers to discharge Decreased caregiver support         AM-PAC PT "6 Clicks" Daily Activity  Outcome Measure Difficulty turning over in bed (including adjusting bedclothes, sheets and blankets)?: A Little Difficulty moving from lying on back to sitting on the side of the bed? : Unable Difficulty sitting down on and standing up from a chair with arms (e.g., wheelchair, bedside commode, etc,.)?: Unable Help needed moving to and from a bed to chair (including a wheelchair)?: A Little Help needed walking in hospital room?: A Little Help needed climbing 3-5 steps with a railing? : A Lot 6 Click Score: 13    End of Session Equipment Utilized During Treatment: Gait belt;Oxygen Activity Tolerance: Patient tolerated treatment well Patient left: in chair;with call bell/phone within reach Nurse Communication: Mobility status PT Visit  Diagnosis: Other abnormalities of gait and mobility (R26.89)    Time: 1751-0258 PT Time Calculation (min) (ACUTE ONLY): 32 min   Charges:   PT Evaluation $PT Eval Moderate Complexity: 1 Mod PT Treatments $Gait Training: 8-22 mins   PT G Codes:        [place] Vandergriff B. Migdalia Dk PT, DPT Acute Rehabilitation  [phone] Pager [phone]    [place] 03/26/2018, 11:35 AM

## 2018-03-26 NOTE — Progress Notes (Signed)
OT Note Addendum for Charges    03/26/18 1552  OT Visit Information  Last OT Received On 03/26/18  OT General Charges  $OT Visit 1 Visit  OT Evaluation  $OT Eval Moderate Complexity 1 Mod  OT Treatments  $Self Care/Home Management  8-22 mins   [place] A. Ulice Brilliant, M.S., OTR/L Acute Rehab Department: [phone]

## 2018-03-26 NOTE — Care Management Note (Addendum)
Case Management Note  Patient Details  Name: Rebecca Norman MRN: [id number] Date of Birth: [birth year]  Subjective/Objective:    From home, presents with CHF, on 7liters HF [place], will be here for a couple more days, she is active with [facility] [place] [place], will need to add [facility] for CHF prior to dc.  Await pt eval.              Action/Plan: NCM will follow for dc needs.  Expected Discharge Date:  03/28/18               Expected Discharge Plan:  Assisted Living / Rest Home  In-House Referral:  Clinical Social Work  Discharge planning Services  CM Consult  Post Acute Care Choice:  Resumption of Svcs/PTA Provider Choice offered to:     DME Arranged:    DME Agency:     HH Arranged:  RN, PT, OT [place] Agency:  [facility] (formerly [employer])  Status of Service:  In process, will continue to follow  If discussed at Long Length of Stay Meetings, dates discussed:    Additional Comments:  Zenon Mayo, RN 03/26/2018, 10:32 AM

## 2018-03-27 LAB — BASIC METABOLIC PANEL
Anion gap: 8 (ref 5–15)
BUN: 29 mg/dL — ABNORMAL HIGH (ref 8–23)
CO2: 31 mmol/L (ref 22–32)
CREATININE: 1.14 mg/dL — AB (ref 0.44–1.00)
Calcium: 8.3 mg/dL — ABNORMAL LOW (ref 8.9–10.3)
Chloride: 96 mmol/L — ABNORMAL LOW (ref 98–111)
GFR, EST AFRICAN AMERICAN: 48 mL/min — AB (ref >60)
GFR, EST NON AFRICAN AMERICAN: 41 mL/min — AB (ref >60)
Glucose, Bld: 88 mg/dL (ref 70–99)
Potassium: 3.7 mmol/L (ref 3.5–5.1)
SODIUM: 135 mmol/L (ref 135–145)

## 2018-03-27 MED ORDER — MAGNESIUM SULFATE IN D5W 1-5 GM/100ML-% IV SOLN
1.0000 g | Freq: Once | INTRAVENOUS | Status: AC
  Administered 2018-03-27: 1 g via INTRAVENOUS
  Filled 2018-03-27: qty 100

## 2018-03-27 MED ORDER — BISACODYL 10 MG RE SUPP
10.0000 mg | Freq: Every day | RECTAL | Status: DC
  Filled 2018-03-27: qty 1

## 2018-03-27 MED ORDER — DOCUSATE SODIUM 100 MG PO CAPS
200.0000 mg | ORAL_CAPSULE | Freq: Two times a day (BID) | ORAL | Status: DC
  Administered 2018-03-27 – 2018-03-28 (×2): 200 mg via ORAL
  Filled 2018-03-27 (×3): qty 2

## 2018-03-27 NOTE — Progress Notes (Signed)
@IPLOG @        PROGRESS NOTE                                                                                                                                                                                                             Patient Demographics:    Rebecca Norman, is a 84 y.o. female, DOB - [birth year], YCX:[id number]  Admit date - 03/25/2018   Admitting Physician Vianne Bulls, MD  Outpatient Primary MD for the patient is [name], MD  LOS - 2  Chief Complaint  Patient presents with  . Shortness of Breath       Brief Narrative  Rebecca Norman is a 84 y.o. female with medical history significant for hypothyroidism, paroxysmal atrial fibrillation on Eliquis, and chronic diastolic CHF with chronic 3 L/min supplemental oxygen requirement, presenting to the emergency department with acute shortness of breath and orthopnea that woke her from sleep.  Patient reports that she stopped taking Lasix about 3 days prior to admission as she misunderstood her doctor's instruction, she was admitted with CHF initially required CPAP in the ER.   Subjective:   Patient in bed, appears comfortable, denies any headache, no fever, no chest pain or pressure, much improved orthopnea and shortness of breath , no abdominal pain. No focal weakness.   Assessment  & Plan :     1.  Acute hypoxic respiratory failure due to acute on chronic diastolic CHF EF 49% on recent echocardiogram.  This happened as patient stopped taking Lasix in error few days prior to admission, she has responded very well to IV Lasix which will be continued for at least another day thereafter can be transitioned to oral Lasix preferably 60 twice daily, continue beta-blocker along with salt and fluid restriction.  Continue supplemental oxygen, currently off of CPAP clinically much improved.  Monitor intake, output and electrolytes closely.  2.  Paroxysmal atrial fibrillation Mali vas 2 score is at least 4.  Continue  combination of beta-blocker amiodarone and Eliquis.  3.  Hypothyroidism.  Stable TSH continue home dose Synthroid.  4.  Reactionary leukocytosis.  No UTI, no pneumonia clinically or on x-ray, hold antibiotics, trend improving.    Diet :  Diet Order           Diet Heart Room service appropriate? Yes; Fluid consistency: Thin; Fluid restriction: 1500 mL Fluid  Diet effective now           Family Communication  : Daughter bedside  Code Status : DNR  Disposition  Plan  : Likely home tomorrow if can be transitioned to oral Lasix, she is almost back to baseline stays she is still little short of breath and orthopneic but much better than before  Consults  : None  Procedures  :    Echocardiogram March 2019.  - Left ventricle: The cavity size was normal. There was severe concentric hypertrophy. Systolic function was vigorous. The estimated ejection fraction was in the range of 65% to 70%. Wall motion was normal; there were no regional wall motion abnormalities. Doppler parameters are consistent with high ventricular filling pressure. - Aortic valve: There was very mild stenosis. There was trivial regurgitation. Peak velocity (S): 215 cm/s. Mean gradient (S): 11 mm Hg. Valve area (Vmax): 1.94 cm^2. - Mitral valve: Severely calcified annulus. Mildly thickened leaflets . There was mild regurgitation. - Left atrium: The atrium was moderately dilated.   DVT Prophylaxis  : Eliquis  Lab Results  Component Value Date   PLT 198 03/26/2018    Inpatient Medications  Scheduled Meds: . amiodarone  200 mg Oral Daily  . apixaban  5 mg Oral BID  . bisacodyl  10 mg Rectal Daily  . docusate sodium  200 mg Oral BID  . furosemide  40 mg Intravenous BID  . levothyroxine  50 mcg Oral Q48H   And  . levothyroxine  75 mcg Oral Q48H  . metoprolol tartrate  12.5 mg Oral BID  . sodium chloride flush  3 mL Intravenous Q12H  . vitamin B-12  1,000 mcg Oral Daily   Continuous Infusions: . sodium  chloride    . magnesium sulfate 1 - 4 g bolus IVPB     PRN Meds:.sodium chloride, acetaminophen, ondansetron (ZOFRAN) IV, sodium chloride flush  Antibiotics  :    Anti-infectives (From admission, onward)   Start     Dose/Rate Route Frequency Ordered Stop   03/25/18 0630  cefTRIAXone (ROCEPHIN) 1 g in sodium chloride 0.9 % 100 mL IVPB  Status:  Discontinued     1 g 200 mL/hr over 30 Minutes Intravenous Every 24 hours 03/25/18 0503 03/25/18 1016         Objective:   Vitals:   03/27/18 0300 03/27/18 0730 03/27/18 0827 03/27/18 1230  BP: (!) 106/45  122/62 (!) 135/52  Pulse: (!) 56 (!) 51 65 (!) 57  Resp: (!) 22 20 14  (!) 23  Temp: 98 F (36.7 C)  98.1 F (36.7 C) 99.3 F (37.4 C)  TempSrc: Oral  Oral Oral  SpO2: 100% 95% 95% 96%  Weight:      Height:        Wt Readings from Last 3 Encounters:  03/27/18 72 kg (158 lb 11.7 oz)  03/15/18 73 kg (161 lb)  03/05/18 71.7 kg (158 lb)     Intake/Output Summary (Last 24 hours) at 03/27/2018 1413 Last data filed at 03/27/2018 1259 Gross per 24 hour  Intake 477 ml  Output 4 ml  Net 473 ml     Physical Exam  Awake Alert, Oriented X 3, No new F.N deficits, Normal affect [place].AT,PERRAL Supple Neck,No JVD, No cervical lymphadenopathy appriciated.  Symmetrical Chest wall movement, Good air movement bilaterally, few rales RRR,No Gallops,Rubs or new Murmurs, No Parasternal Heave +ve B.Sounds, Abd Soft, No tenderness, No organomegaly appriciated, No rebound - guarding or rigidity. No Cyanosis, Clubbing , trace leg edema, No new Rash or bruise      Data Review:    CBC Recent Labs  Lab 03/25/18 0248 03/25/18 0254  03/26/18 0235  WBC 20.9*  --  11.7*  HGB 12.5 13.6 9.5*  HCT 43.8 40.0 30.5*  PLT 260  --  198  MCV 96.1  --  88.7  MCH 27.4  --  27.6  MCHC 28.5*  --  31.1  RDW 15.4  --  15.5  LYMPHSABS 1.5  --   --   MONOABS 1.0  --   --   EOSABS 0.2  --   --   BASOSABS 0.1  --   --     Chemistries  Recent Labs  Lab  03/25/18 0254 03/25/18 0504 03/26/18 0235 03/27/18 0222  NA 139 139 138 135  K 4.9 3.9 3.5 3.7  CL 104 102 99 96*  CO2  --  28 29 31   GLUCOSE 175* 167* 83 88  BUN 34* 21 25* 29*  CREATININE 1.00 1.11* 1.21* 1.14*  CALCIUM  --  9.0 8.3* 8.3*  MG  --   --  1.8  --    ------------------------------------------------------------------------------------------------------------------ No results for input(s): CHOL, HDL, LDLCALC, TRIG, CHOLHDL, LDLDIRECT in the last 72 hours.  No results found for: HGBA1C ------------------------------------------------------------------------------------------------------------------ No results for input(s): TSH, T4TOTAL, T3FREE, THYROIDAB in the last 72 hours.  Invalid input(s): FREET3 ------------------------------------------------------------------------------------------------------------------ No results for input(s): VITAMINB12, FOLATE, FERRITIN, TIBC, IRON, RETICCTPCT in the last 72 hours.  Coagulation profile Recent Labs  Lab 03/25/18 0248  INR 1.32    No results for input(s): DDIMER in the last 72 hours.  Cardiac Enzymes No results for input(s): CKMB, TROPONINI, MYOGLOBIN in the last 168 hours.  Invalid input(s): CK ------------------------------------------------------------------------------------------------------------------    Component Value Date/Time   BNP 945.3 (H) 03/25/2018 0248    Micro Results Recent Results (from the past 240 hour(s))  Urine Culture     Status: Abnormal   Collection Time: 03/25/18  5:17 AM  Result Value Ref Range Status   Specimen Description URINE, RANDOM  Final   Special Requests NONE  Final   Culture (A)  Final    <10,000 COLONIES/mL INSIGNIFICANT GROWTH Performed at [facility], [address] [place], [place] [address]    Report Status 03/26/2018 FINAL  Final  MRSA PCR Screening     Status: None   Collection Time: 03/25/18  6:12 AM  Result Value Ref Range Status   MRSA by PCR  NEGATIVE NEGATIVE Final    Comment:        The GeneXpert MRSA Assay (FDA approved for NASAL specimens only), is one component of a comprehensive MRSA colonization surveillance program. It is not intended to diagnose MRSA infection nor to guide or monitor treatment for MRSA infections. Performed at [facility], [place] [address], [place], [place] [address]     Radiology Reports Dg Chest Portable 1 View  Result Date: 03/25/2018 CLINICAL DATA:  Dyspnea.  Patient missed several doses of Lasix. EXAM: PORTABLE CHEST 1 VIEW COMPARISON:  02/07/2018 FINDINGS: Stable cardiomegaly with moderate aortic atherosclerosis. Interval increase in pulmonary edema and bilateral pleural effusions since prior. No acute osseous abnormality. IMPRESSION: Worsening of interstitial edema with slight interval increase in bilateral pleural effusions. Stable cardiomegaly with aortic atherosclerosis. Electronically Signed   By: Ashley Royalty M.D.   On: 03/25/2018 03:32    Time Spent in minutes  30   Lala Lund M.D on 03/27/2018 at 2:13 PM  To page go to [email or web address] - password [facility]

## 2018-03-28 ENCOUNTER — Other Ambulatory Visit: Payer: Self-pay | Admitting: Pharmacist

## 2018-03-28 ENCOUNTER — Telehealth (HOSPITAL_COMMUNITY): Payer: Self-pay | Admitting: *Deleted

## 2018-03-28 LAB — BASIC METABOLIC PANEL
ANION GAP: 7 (ref 5–15)
BUN: 21 mg/dL (ref 8–23)
CALCIUM: 8.4 mg/dL — AB (ref 8.9–10.3)
CO2: 30 mmol/L (ref 22–32)
Chloride: 99 mmol/L (ref 98–111)
Creatinine, Ser: 0.98 mg/dL (ref 0.44–1.00)
GFR calc Af Amer: 57 mL/min — ABNORMAL LOW (ref >60)
GFR, EST NON AFRICAN AMERICAN: 49 mL/min — AB (ref >60)
GLUCOSE: 144 mg/dL — AB (ref 70–99)
Potassium: 3.4 mmol/L — ABNORMAL LOW (ref 3.5–5.1)
Sodium: 136 mmol/L (ref 135–145)

## 2018-03-28 LAB — MAGNESIUM: Magnesium: 2.2 mg/dL (ref 1.7–2.4)

## 2018-03-28 MED ORDER — FUROSEMIDE 20 MG PO TABS: ORAL_TABLET | ORAL | 0 refills | Status: DC

## 2018-03-28 MED ORDER — POTASSIUM CHLORIDE CRYS ER 20 MEQ PO TBCR
40.0000 meq | EXTENDED_RELEASE_TABLET | Freq: Once | ORAL | Status: AC
  Administered 2018-03-28: 40 meq via ORAL
  Filled 2018-03-28: qty 2

## 2018-03-28 MED ORDER — POTASSIUM CHLORIDE ER 20 MEQ PO TBCR: 20.0000 meq | EXTENDED_RELEASE_TABLET | Freq: Every day | ORAL | 0 refills | Status: DC

## 2018-03-28 NOTE — Discharge Summary (Signed)
Physician Discharge Summary  Rebecca Norman QHU:[id number] DOB: July 07, 1925 DOA: 03/25/2018  PCP: [name], MD  Admit date: 03/25/2018 Discharge date: 03/28/2018  Admitted From: Home Disposition:  Home  Discharge Condition:Stable CODE STATUS:DNR Diet recommendation: Heart Healthy   Brief/Interim Summary:  Rebecca Burgen Chiltonis a 84 y.o.femalewith medical history significant forhypothyroidism, paroxysmal atrial fibrillation on Eliquis, and chronic diastolic CHF with chronic 3 L/min supplemental oxygen requirement, presenting to the emergency department with acute shortness of breath and orthopnea that woke her from sleep. Patient reports that she stopped taking Lasix about 3 days prior to admission as she misunderstood her doctor's instruction, she was admitted with CHF initially required CPAP in the ER. Patient was started on IV Lasix after admission.  Respiratory status continued to improve.  She had significant urine output on this admission.This morning she was comfortable. Denies any shortness of breath.  Her lungs are mostly clear and legs are not edematous.  Patient is stable for discharge to home today with home health physical therapy.  She will continue Lasix at home.  Following problems were addressed during her hospitalization:  1.  Acute hypoxic respiratory failure due to acute on chronic diastolic CHF: EF 46% on recent echocardiogram.  This happened as patient stopped taking Lasix in error few days prior to admission, she has responded very well to IV Lasix which will be transitioned to oral Lasix on her usual home dose.Continue beta-blocker along with salt and fluid restriction.  Continue supplemental oxygen at home.Follow up with her own cardiologist on discharge.  2.  Paroxysmal atrial fibrillation: CHADVASc 2 score is at least 4.  Continue combination of beta-blocker amiodarone and Eliquis.  3.  Hypothyroidism: Stable TSH continue home dose Synthroid.  4.   Reactionary leukocytosis: No UTI, no pneumonia clinically or on x-ray, hold antibiotics, trend improving.   Discharge Diagnoses:  Principal Problem:   Acute on chronic diastolic (congestive) heart failure (HCC) Active Problems:   HTN (hypertension)   Hypothyroid   Paroxysmal atrial fibrillation (HCC)   Acute lower UTI   Acute on chronic respiratory failure with hypoxia (HCC)   Acute on chronic diastolic CHF (congestive heart failure) [facility])    Discharge Instructions  Discharge Instructions    Diet - low sodium heart healthy   Complete by:  As directed    Discharge instructions   Complete by:  As directed    1) Follow up with your PCP in a week.Do a BMP test during the follow up. 2) Follow up with your cardiologist in 2 weeks. 3) Take prescribed medications as instructed. 4) Limit fluid intake to less than 1200 ml a day and salt less than 2 grams a day.   Increase activity slowly   Complete by:  As directed      Allergies as of 03/28/2018      Reactions   Flagyl [metronidazole] Hives   Lisinopril Cough      Medication List    TAKE these medications   acetaminophen 500 MG tablet Commonly known as:  TYLENOL Take 500 mg by mouth every 6 (six) hours as needed for headache (pain).   amiodarone 200 MG tablet Commonly known as:  PACERONE Take 1 tablet (200 mg total) by mouth daily.   apixaban 5 MG Tabs tablet Commonly known as:  ELIQUIS Take 1 tablet (5 mg total) by mouth 2 (two) times daily.   furosemide 20 MG tablet Commonly known as:  LASIX Take 40 mg (2 tabs) in a.m. And take 20mg  (1  tab) in the afternoon What changed:    how much to take  how to take this  when to take this  additional instructions   levothyroxine 50 MCG tablet Commonly known as:  SYNTHROID, LEVOTHROID Take 50-75 mcg by mouth See admin instructions. Take every other alternating between 13mcg and 62mcg.   metoprolol tartrate 25 MG tablet Commonly known as:  LOPRESSOR Take 0.5 tablets  (12.5 mg total) by mouth 2 (two) times daily.   nitrofurantoin 100 MG capsule Commonly known as:  MACRODANTIN Take 100 mg by mouth 2 (two) times daily.   ondansetron 4 MG tablet Commonly known as:  ZOFRAN Take 4 mg by mouth every 6 (six) hours as needed for nausea or vomiting.   Potassium Chloride ER 20 MEQ Tbcr Take 20 mEq by mouth daily.   vitamin B-12 1000 MCG tablet Commonly known as:  CYANOCOBALAMIN Take 1 tablet (1,000 mcg total) by mouth daily.      Follow-up Information    [name], MD. Schedule an appointment as soon as possible for a visit in 1 week(s).   Specialty:  Family Medicine Contact information: [address] [place] [address] [phone]        Lelon Perla, MD. Schedule an appointment as soon as possible for a visit in 2 week(s).   Specialty:  Cardiology Contact information: [address] [place] [address] [phone]          Allergies  Allergen Reactions  . Flagyl [Metronidazole] Hives  . Lisinopril Cough    Consultations: None  Procedures/Studies: Dg Chest Portable 1 View  Result Date: 03/25/2018 CLINICAL DATA:  Dyspnea.  Patient missed several doses of Lasix. EXAM: PORTABLE CHEST 1 VIEW COMPARISON:  02/07/2018 FINDINGS: Stable cardiomegaly with moderate aortic atherosclerosis. Interval increase in pulmonary edema and bilateral pleural effusions since prior. No acute osseous abnormality. IMPRESSION: Worsening of interstitial edema with slight interval increase in bilateral pleural effusions. Stable cardiomegaly with aortic atherosclerosis. Electronically Signed   By: Ashley Royalty M.D.   On: 03/25/2018 03:32       Subjective: Patient seen and examined the bedside this morning.  Remains comfortable.  Denies any shortness of breath.  Stable for discharge home today.  Discharge Exam: Vitals:   03/28/18 0300 03/28/18 0816  BP: (!) 116/45 130/76  Pulse:  68  Resp: 20 19  Temp: 97.8  F (36.6 C) 98.5 F (36.9 C)  SpO2: 96% 96%   Vitals:   03/27/18 2330 03/28/18 0129 03/28/18 0300 03/28/18 0816  BP: (!) 132/51  (!) 116/45 130/76  Pulse: 61   68  Resp: 18  20 19   Temp: 98 F (36.7 C)  97.8 F (36.6 C) 98.5 F (36.9 C)  TempSrc: Oral  Oral Oral  SpO2: 98%  96% 96%  Weight:  71.8 kg (158 lb 4.6 oz)    Height:        General: Pt is alert, awake, not in acute distress,elderly female Cardiovascular: RRR, S1/S2 +, no rubs, no gallops Respiratory: Few bibasilar crackles Abdominal: Soft, NT, ND, bowel sounds + Extremities: trace edema, no cyanosis    The results of significant diagnostics from this hospitalization (including imaging, microbiology, ancillary and laboratory) are listed below for reference.     Microbiology: Recent Results (from the past 240 hour(s))  Urine Culture     Status: Abnormal   Collection Time: 03/25/18  5:17 AM  Result Value Ref Range Status   Specimen Description URINE, RANDOM  Final   Special Requests NONE  Final   Culture (A)  Final    <10,000 COLONIES/mL INSIGNIFICANT GROWTH Performed at [facility], [place] [address] [place], [place] [address]    Report Status 03/26/2018 FINAL  Final  MRSA PCR Screening     Status: None   Collection Time: 03/25/18  6:12 AM  Result Value Ref Range Status   MRSA by PCR NEGATIVE NEGATIVE Final    Comment:        The GeneXpert MRSA Assay (FDA approved for NASAL specimens only), is one component of a comprehensive MRSA colonization surveillance program. It is not intended to diagnose MRSA infection nor to guide or monitor treatment for MRSA infections. Performed at [facility], [place] [address], [place], [place] [address]      Labs: BNP (last 3 results) Recent Labs    11/12/17 1748 02/07/18 1601 03/25/18 0248  BNP 871.5* 1,134.6* 563.1*   Basic Metabolic Panel: Recent Labs  Lab 03/25/18 0254 03/25/18 0504 03/26/18 0235 03/27/18 0222 03/28/18 0345  NA 139 139  138 135 136  K 4.9 3.9 3.5 3.7 3.4*  CL 104 102 99 96* 99  CO2  --  28 29 31 30   GLUCOSE 175* 167* 83 88 144*  BUN 34* 21 25* 29* 21  CREATININE 1.00 1.11* 1.21* 1.14* 0.98  CALCIUM  --  9.0 8.3* 8.3* 8.4*  MG  --   --  1.8  --  2.2   Liver Function Tests: No results for input(s): AST, ALT, ALKPHOS, BILITOT, PROT, ALBUMIN in the last 168 hours. No results for input(s): LIPASE, AMYLASE in the last 168 hours. No results for input(s): AMMONIA in the last 168 hours. CBC: Recent Labs  Lab 03/25/18 0248 03/25/18 0254 03/26/18 0235  WBC 20.9*  --  11.7*  NEUTROABS 18.1*  --   --   HGB 12.5 13.6 9.5*  HCT 43.8 40.0 30.5*  MCV 96.1  --  88.7  PLT 260  --  198   Cardiac Enzymes: No results for input(s): CKTOTAL, CKMB, CKMBINDEX, TROPONINI in the last 168 hours. BNP: Invalid input(s): POCBNP CBG: No results for input(s): GLUCAP in the last 168 hours. D-Dimer No results for input(s): DDIMER in the last 72 hours. Hgb A1c No results for input(s): HGBA1C in the last 72 hours. Lipid Profile No results for input(s): CHOL, HDL, LDLCALC, TRIG, CHOLHDL, LDLDIRECT in the last 72 hours. Thyroid function studies No results for input(s): TSH, T4TOTAL, T3FREE, THYROIDAB in the last 72 hours.  Invalid input(s): FREET3 Anemia work up No results for input(s): VITAMINB12, FOLATE, FERRITIN, TIBC, IRON, RETICCTPCT in the last 72 hours. Urinalysis    Component Value Date/Time   COLORURINE COLORLESS (A) 03/25/2018 0440   APPEARANCEUR CLEAR 03/25/2018 0440   LABSPEC 1.005 03/25/2018 0440   PHURINE 5.0 03/25/2018 0440   GLUCOSEU NEGATIVE 03/25/2018 0440   HGBUR NEGATIVE 03/25/2018 0440   BILIRUBINUR NEGATIVE 03/25/2018 0440   KETONESUR NEGATIVE 03/25/2018 0440   PROTEINUR NEGATIVE 03/25/2018 0440   UROBILINOGEN 0.2 12/30/2008 1535   NITRITE NEGATIVE 03/25/2018 0440   LEUKOCYTESUR TRACE (A) 03/25/2018 0440   Sepsis Labs Invalid input(s): PROCALCITONIN,  WBC,   LACTICIDVEN Microbiology Recent Results (from the past 240 hour(s))  Urine Culture     Status: Abnormal   Collection Time: 03/25/18  5:17 AM  Result Value Ref Range Status   Specimen Description URINE, RANDOM  Final   Special Requests NONE  Final   Culture (A)  Final    <10,000 COLONIES/mL INSIGNIFICANT GROWTH Performed at [place] [address] [place], [place] [address]    Report Status 03/26/2018 FINAL  Final  MRSA PCR Screening     Status: None   Collection Time: 03/25/18  6:12 AM  Result Value Ref Range Status   MRSA by PCR NEGATIVE NEGATIVE Final    Comment:        The GeneXpert MRSA Assay (FDA approved for NASAL specimens only), is one component of a comprehensive MRSA colonization surveillance program. It is not intended to diagnose MRSA infection nor to guide or monitor treatment for MRSA infections. Performed at [facility], [place] [address], [place], [place] [address]     Please note: You were cared for by a hospitalist during your hospital stay. Once you are discharged, your primary care physician will handle any further medical issues. Please note that NO REFILLS for any discharge medications will be authorized once you are discharged, as it is imperative that you return to your primary care physician (or establish a relationship with a primary care physician if you do not have one) for your post hospital discharge needs so that they can reassess your need for medications and monitor your lab values.    Time coordinating discharge: 40 minutes  SIGNED:   Shelly Coss, MD  [facility] 03/28/2018, 10:09 AM Pager [phone]  If 7PM-7AM, please contact night-coverage [email or web address] Password [email or web address]

## 2018-03-28 NOTE — Progress Notes (Signed)
Occupational Therapy Treatment Patient Details Name: Rebecca Norman MRN: [id number] DOB: August 09, 1925 Today's Date: 03/28/2018    History of present illness 84 y.o. female with medical history significant for hypothyroidism, paroxysmal atrial fibrillation on Eliquis, and chronic diastolic CHF with chronic 3 L/min supplemental oxygen requirement, presenting to the emergency department with acute shortness of breath and orthopnea that woke her from sleep.  Patient reports that she stopped taking Lasix about 3 days prior to admission as she misunderstood her doctor's instruction, she was admitted with CHF initially required CPAP in the ER.   OT comments  Session focused on energy conservation and fall prevention strategy education with provided handouts. Pt verbalized having already established fall prevention strategies in the bathroom and plans to use more around the house. Pt required VC to identify appropriate energy conservation strategies with hypothetical scenarios provided. All acute need identified, all additional OT needs to be addressed at d/c venue. Pt to d/c when medically stable.   Follow Up Recommendations  Home health OT;Supervision/Assistance - 24 hour    Equipment Recommendations  3 in 1 bedside commode    Recommendations for Other Services      Precautions / Restrictions Precautions Precautions: Fall       Mobility Bed Mobility                  Transfers                      Balance                                           ADL either performed or assessed with clinical judgement   ADL                                               Vision       Perception     Praxis      Cognition Arousal/Alertness: Awake/alert Behavior During Therapy: WFL for tasks assessed/performed Overall Cognitive Status: Within Functional Limits for tasks assessed                                           Exercises     Shoulder Instructions       General Comments session focused on education on energy conservation strategies and fall prevention strategies in the home;pt reported already established fall prevention strategies in the bathroom and plans to establish more around the house;    Pertinent Vitals/ Pain       Pain Assessment: No/denies pain  Home Living                                          Prior Functioning/Environment              Frequency  Min 2X/week        Progress Toward Goals  OT Goals(current goals can now be found in the care plan section)  Progress towards OT goals: Progressing toward goals  Acute Rehab OT Goals Patient Stated Goal: "im  ready to go home" OT Goal Formulation: With patient Time For Goal Achievement: 04/09/18 Potential to Achieve Goals: Good ADL Goals Pt Will Perform Grooming: with modified independence Pt Will Perform Lower Body Dressing: with modified independence Pt Will Transfer to Toilet: with modified independence;ambulating Additional ADL Goal #1: Pt will demonstrate understanding of 3 energy conservation strategies.  Plan Discharge plan remains appropriate    Co-evaluation                 AM-PAC PT "6 Clicks" Daily Activity     Outcome Measure   Help from another person eating meals?: None Help from another person taking care of personal grooming?: A Little Help from another person toileting, which includes using toliet, bedpan, or urinal?: A Little Help from another person bathing (including washing, rinsing, drying)?: A Little Help from another person to put on and taking off regular upper body clothing?: A Little Help from another person to put on and taking off regular lower body clothing?: A Little 6 Click Score: 19    End of Session    OT Visit Diagnosis: Unsteadiness on feet (R26.81);Other abnormalities of gait and mobility (R26.89);Muscle weakness (generalized) (M62.81);History  of falling (Z91.81)   Activity Tolerance Patient tolerated treatment well   Patient Left in chair;with call bell/phone within reach   Nurse Communication Mobility status        Time: 0272-5366 OT Time Calculation (min): 9 min  Charges:    Dorinda Hill OTS     Dorinda Hill 03/28/2018, 2:27 PM

## 2018-03-28 NOTE — Progress Notes (Signed)
OT Note - Addendum    03/28/18 1435  OT Visit Information  Last OT Received On 03/28/18  OT Time Calculation  OT Start Time (ACUTE ONLY) 1156  OT Stop Time (ACUTE ONLY) 1205  OT Time Calculation (min) 9 min  OT General Charges  $OT Visit 1 Visit  OT Treatments  $Self Care/Home Management  8-22 mins  Maurie Boettcher, OT/L  OT Clinical Specialist [phone]

## 2018-03-28 NOTE — Consult Note (Signed)
   Rebecca Norman   03/28/2018  SHAMS FILL [birth year] [id number]    Patient screened for [place] Management program due to increased unplanned readmission risk score of 25% and multiple hospitalizations.  Spoke with inpatient LCSW to confirm patient was from home not ALF prior to admit.   Went to bedside to speak with Rebecca Norman about [place] Management program services. She is agreeable and [facility] Management written consent obtained. [facility] folder provided.   Rebecca Norman states she lives alone.  States her son and daughter assist with transportation to MD appointments. Confirmed Primary Care MD is Dr. Brigitte Pulse (PCP office listed as doing transition of care calls). Confirmed best contact number for Rebecca Norman as [phone].   Rebecca Norman states her Eliquis costs her 300.00 dollars a month and "that can be costly because I am on a fixed income". States " I have not qualified for medication assistance before". However, she is agreeable to [facility] Pharmacist follow up for medication affordability.Confirms she [facility] [address] for her medications.  Spoke with inpatient RNCM to make aware [facility] will follow post discharge. Also discussed that patient is eligible for [facility] First if appropriate. Rebecca Norman was active with [facility] prior to hospitalization.  Will make referral to [facility] RNCM and [facility] Pharmacist. Noted Rebecca Norman to discharge home today.   Medical history of CHF, paroxysmal afib, HTN, UTI.   Marthenia Rolling, MSN-Ed, RN,BSN [facility] Liaison [phone]

## 2018-03-28 NOTE — Care Management Note (Signed)
Case Management Note  Patient Details  Name: Rebecca Norman MRN: [id number] Date of Birth: [birth year]  Subjective/Objective:  From home, presents with CHF, on 7liters HF [place], will be here for a couple more days, she is active with [facility] for [place], [place], will need to add [facility] for CHF prior to dc. Will need to resume [facility] services prior to dc, Tiffany with [facility] notified. She also needs 3 n 1, James with [facility] notified ,will bring to patient's room prior to dc.  Patient also states she pays 300.00 for her eliquis and she spoke with eliquis rep and they informed her that she does not qualify for patient assist.  [facility] is following and will make referral to [place]. Patient is eligible for Home First Program,  Referral made to [facility] with [facility],  She needs 24 hr care,  Tommi Rumps will see if he can staff her today and call NCM back. Tommi Rumps states he can staff patient. NCM notified Tiffany with [facility] to discharge patient.                     Action/Plan: DC home with Home First Program  Expected Discharge Date:  03/28/18               Expected Discharge Plan:  [place]  In-House Referral:  Clinical Social Work  Discharge planning Services  CM Consult  Post Acute Care Choice:  Home Health Choice offered to:  Patient  DME Arranged:  3-N-1 DME Agency:  [place]  HH Arranged:  RN, PT, OT [facility] Agency:  [place]  Status of Service:  Completed, signed off  If discussed at [place] of Stay Meetings, dates discussed:    Additional Comments:  Rebecca Mayo, RN 03/28/2018, 12:27 PM

## 2018-03-28 NOTE — Patient Outreach (Signed)
[place] [facility]) Care Management  03/28/2018  SAKI LEGORE 1924/11/23 [id number]   Patient was called regarding medication review post discharge. Unfortunately, she did not answer the phone. HIPAA compliant message was left on her voicemail.  Plan: Call patient back in 3-4 business days Send unsuccessful contact letter.   Elayne Guerin, PharmD, [place] Clinical Pharmacist [phone]

## 2018-03-28 NOTE — Progress Notes (Signed)
1. ELIQUIS 2.5 MG BID  COVER- YES  CO-PAY- $ 53.49  TIER- 3 DRUG  PRIOR APPROVAL- YES # [phone]   2. ELIQUIS 5 MG BID  COVER- YES  CO-PAY- $ 53.49  TIER-3 DRUG  PRIOR APPROVAL- YES # [phone]    3. XARELTO  15 MG BID  COVER- YES  CO-PAY- $ 107.85  TIER- 3 DRUG  PRIOR APPROVAL- YES # [phone]    4. XARELTO  20 MG DAILY  COVER- YES  CO-PAY - $ 107.85  TIER- 3 DRUG  PRIOR APPROVAL- YES # [phone]   NO DEDUCTIBLE  PREFERRED PHARMACY : YES  [facility]

## 2018-03-28 NOTE — Telephone Encounter (Signed)
eliquis was approved through [facility] from 02/26/18-03/28/19 Case ID #[id number]

## 2018-03-28 NOTE — Care Management Note (Addendum)
Case Management Note  Patient Details  Name: MATHILDA MAGUIRE MRN: [id number] Date of Birth: 1924/09/06  Subjective/Objective:  From home, presents with CHF, on 7liters HF [place], will be here for a couple more days, she is [facility] for [place], [place], will need to add [facility] for CHF prior to dc.  Will need to resume [facility] services prior to dc, Tiffany with [facility] notified. She also needs 3 n 1, James with [facility] notified ,will bring to patient's room prior to dc.  Patient also states she pays 300.00 for her eliquis and she spoke with eliquis rep and they informed her that she does not qualify for patient assist.  [facility] is following and will make referral to [place]. Patient is eligible for Home First Program,  Referral made to [facility] with [facility],  She needs 24 hr care,  Tommi Rumps will see if he can staff her today and call NCM back.                     Action/Plan: DC home with resumption of [place] services.  Expected Discharge Date:  03/28/18               Expected Discharge Plan:  [place]  In-House Referral:  Clinical Social Work  Discharge planning Services  CM Consult  Post Acute Care Choice:  Resumption of Svcs/PTA Provider Choice offered to:     DME Arranged:  3-N-1 DME Agency:  [place]:  RN, PT, OT [place] Agency:  [facility]  Status of Service:  Completed, signed off  If discussed at [employer], dates discussed:    Additional Comments:  Zenon Mayo, RN 03/28/2018, 10:51 AM

## 2018-03-28 NOTE — Progress Notes (Signed)
PT Cancellation Note  Patient Details Name: Rebecca Norman MRN: [id number] DOB: 1925-08-07   Cancelled Treatment:    Reason Eval/Treat Not Completed: (P) Other (comment) Pt reports d/c today and does not want to work with therapy today.   Maijor Hornig B. Migdalia Dk PT, DPT Acute Rehabilitation  [phone] Pager [phone]  [place] 03/28/2018, 12:20 PM

## 2018-03-29 ENCOUNTER — Other Ambulatory Visit: Payer: Self-pay | Admitting: *Deleted

## 2018-03-29 ENCOUNTER — Other Ambulatory Visit: Payer: Self-pay

## 2018-03-29 NOTE — Patient Outreach (Signed)
[facility] [facility]) Care Management  03/29/2018  Rebecca Norman 1925-06-08 [id number]  84 year old female referred to [place] Management. [place] services requested for 30 day post discharge medication review and patient assistance.  PMHx includes, but not limited to, hypertension, chronic diastolic heart failure, atrial fibrillation, hypothyroid and bladder cancer.  Successful outreach to Rebecca Norman.  HIPAA identifiers verified.  Rebecca Norman requested that I speak with her daughter, Rebecca Norman who manages her medications.  She states that her daughter is a Marine scientist.  Left HIPAA compliant voice message for daughter to return my call.  Plan:  Will refer to Rebecca Norman, [facility] Pharmacist to follow up with Rebecca Norman in 3-4 business days.   Rebecca Norman, PharmD Clinical Pharmacist [place] [phone]

## 2018-03-29 NOTE — Patient Outreach (Addendum)
[place] [facility]) Care Management  [place]   03/29/2018  Rebecca Norman April 15, 1925 [id number]   84 year old female referred to [place] Management. [place] services requested for 30 day post discharge medication review and patient assistance.  PMHx includes, but not limited to, hypertension, chronic diastolic heart failure, atrial fibrillation, hypothyroid and bladder cancer.  Successful outreach to Rebecca Norman.  HIPAA identifiers verified.  Rebecca Norman requested that I speak with her daughter, Rebecca Norman who manages her medications.  She states that her daughter is a Marine scientist.  Left HIPAA compliant voice message for daughter to return my call.  Plan:  Will refer to Rebecca Norman, [facility] Pharmacist to follow up with Rebecca Norman in 3-4 business days.   Rebecca Norman, PharmD Clinical Pharmacist [place] [phone]   Addendum:   Incoming call received from Rebecca Norman's daughter, Rebecca Norman.  HIPAA identifiers verified.   Subjective: Rebecca Norman states that her mom is doing well  She reports that since her mom's fall I December that she has had trouble getting her strength back.  She reports that her mom had stopped taking her furosemide even thought it was in her pill box because she thought she didn't need it.   She states that her mom checks her weights daily.  She reports that Rebecca Norman's feet are less edematous.   She states that she helps manage her mother's medications and that the only expensive medication she has is Rebecca Norman.    Objective:  Labs 03/28/18 Scr 0.98 mg/dL  (Estimated CrCl ~57ml/min) Potassium 3.4 mmol/L    Current Medications: Current Outpatient Medications  Medication Sig Dispense Refill  . acetaminophen (TYLENOL) 500 MG tablet Take 500 mg by mouth every 6 (six) hours as needed for headache (pain).     Marland Kitchen amiodarone (PACERONE) 200 MG tablet Take 1 tablet (200 mg total) by mouth daily. 30 tablet 8  . apixaban (Rebecca Norman)  5 MG TABS tablet Take 1 tablet (5 mg total) by mouth 2 (two) times daily. 180 tablet 1  . furosemide (LASIX) 20 MG tablet Take 40 mg (2 tabs) in a.m. And take 20mg  (1 tab) in the afternoon 90 tablet 0  . levothyroxine (SYNTHROID, LEVOTHROID) 50 MCG tablet Take 50-75 mcg by mouth See admin instructions. Take every other alternating between 65mcg and 56mcg.    . metoprolol tartrate (LOPRESSOR) 25 MG tablet Take 0.5 tablets (12.5 mg total) by mouth 2 (two) times daily.    . potassium chloride 20 MEQ TBCR Take 20 mEq by mouth daily. 30 tablet 0  . vitamin B-12 (CYANOCOBALAMIN) 1000 MCG tablet Take 1 tablet (1,000 mcg total) by mouth daily.    . ondansetron (ZOFRAN) 4 MG tablet Take 4 mg by mouth every 6 (six) hours as needed for nausea or vomiting.     No current facility-administered medications for this visit.     Functional Status: In your present state of health, do you have any difficulty performing the following activities: 03/25/2018 02/08/2018  Hearing? N N  Vision? Y N  Comment - -  Difficulty concentrating or making decisions? N N  Walking or climbing stairs? Y Y  Dressing or bathing? N Y  Doing errands, shopping? N Y  Some recent data might be hidden    Fall/Depression Screening: No flowsheet data found. No flowsheet data found.   ASSESSMENT:  Date Discharged from Hospital: 03/28/18 Date Medication Reconciliation Performed: 03/29/2018  New Medications at Discharge:   Potassium chloride  Patient was recently discharged from hospital and all medications have been reviewed  Drugs sorted by system:  Cardiovascular: amiodarone, apixaban, furosemide, metoprolol tartrate, potassium chloride  Gastrointestinal: ondansetron  Endocrine: levothryoxine  Pain: acetaminophen  Vitamins/Minerals: vitamin B-12   Other issues noted:  Daughter reports that her mother may need help affording her Rebecca Norman.  Per financial discussion patient does not qualify for Extra Help LIS.  She states  patient gets her prescriptions from [facility]  and [facility].  I requested that she get a print out of her mother's out of pocket (OOP) expenditures from both pharmacies and determine if she has spent 3% of her income to be eligible to apply for patient assistance for Rebecca Norman from [facility].   Plan: Route note to PCP, Dr. Brigitte Pulse.   Follow up with daughter to determine her OOP expenditure.   Rebecca Norman, PharmD Clinical Pharmacist [place] [phone]

## 2018-03-29 NOTE — Consult Note (Signed)
Telephone call from [facility] with [place] that patient will enroll in the [place] program. Request made to [facility] to please have [place] team re-refer back to [place] Management for chronic disease management post [place] completion.   Will update [facility] Care Management team of [facility] First enrollment.     Marthenia Rolling, MSN-Ed, RN,BSN [facility] Liaison [phone]

## 2018-03-30 DIAGNOSIS — Z87440 Personal history of urinary (tract) infections: Secondary | ICD-10-CM | POA: Diagnosis not present

## 2018-03-30 DIAGNOSIS — N390 Urinary tract infection, site not specified: Secondary | ICD-10-CM | POA: Diagnosis not present

## 2018-03-30 DIAGNOSIS — Z9181 History of falling: Secondary | ICD-10-CM | POA: Diagnosis not present

## 2018-03-30 DIAGNOSIS — K5730 Diverticulosis of large intestine without perforation or abscess without bleeding: Secondary | ICD-10-CM | POA: Diagnosis not present

## 2018-03-30 DIAGNOSIS — E039 Hypothyroidism, unspecified: Secondary | ICD-10-CM | POA: Diagnosis not present

## 2018-03-30 DIAGNOSIS — Z8551 Personal history of malignant neoplasm of bladder: Secondary | ICD-10-CM | POA: Diagnosis not present

## 2018-03-30 DIAGNOSIS — I480 Paroxysmal atrial fibrillation: Secondary | ICD-10-CM | POA: Diagnosis not present

## 2018-03-30 DIAGNOSIS — Z7901 Long term (current) use of anticoagulants: Secondary | ICD-10-CM | POA: Diagnosis not present

## 2018-03-30 DIAGNOSIS — J9621 Acute and chronic respiratory failure with hypoxia: Secondary | ICD-10-CM | POA: Diagnosis not present

## 2018-03-30 DIAGNOSIS — I5033 Acute on chronic diastolic (congestive) heart failure: Secondary | ICD-10-CM | POA: Diagnosis not present

## 2018-03-30 DIAGNOSIS — Z9981 Dependence on supplemental oxygen: Secondary | ICD-10-CM | POA: Diagnosis not present

## 2018-03-30 DIAGNOSIS — I110 Hypertensive heart disease with heart failure: Secondary | ICD-10-CM | POA: Diagnosis not present

## 2018-03-31 DIAGNOSIS — I110 Hypertensive heart disease with heart failure: Secondary | ICD-10-CM | POA: Diagnosis not present

## 2018-03-31 DIAGNOSIS — I5033 Acute on chronic diastolic (congestive) heart failure: Secondary | ICD-10-CM | POA: Diagnosis not present

## 2018-04-02 ENCOUNTER — Other Ambulatory Visit: Payer: Self-pay | Admitting: Pharmacist

## 2018-04-02 DIAGNOSIS — I110 Hypertensive heart disease with heart failure: Secondary | ICD-10-CM | POA: Diagnosis not present

## 2018-04-02 DIAGNOSIS — I5033 Acute on chronic diastolic (congestive) heart failure: Secondary | ICD-10-CM | POA: Diagnosis not present

## 2018-04-02 NOTE — Patient Outreach (Signed)
[facility]) Care Management  04/02/2018  Rebecca Norman 01-10-1925 [id number]   Patient was called back regarding medication assistance. HIPAA identifiers were obtained. Patient asked that I call her daughter to discuss financials. Patient's daughter was called. HIPAA identifiers were obtained. Patient said after her conversation with Anderson Malta last week, she determined that her mother had not spent 3% of her income on medication expenses.  As such, she would not be eligible to receive Eliquis from [employer].  Plan: Close patient's case. Patient's daughter understands she can reach back out in the future with medication related questions or concerns or if she she feels like her mom is getting close to spending 3% of her income.  Closure letter to be sent to the patient and her PCP.   Elayne Guerin, PharmD, [place] Clinical Pharmacist [phone]

## 2018-04-04 DIAGNOSIS — I5033 Acute on chronic diastolic (congestive) heart failure: Secondary | ICD-10-CM | POA: Diagnosis not present

## 2018-04-04 DIAGNOSIS — I110 Hypertensive heart disease with heart failure: Secondary | ICD-10-CM | POA: Diagnosis not present

## 2018-04-05 DIAGNOSIS — I110 Hypertensive heart disease with heart failure: Secondary | ICD-10-CM | POA: Diagnosis not present

## 2018-04-05 DIAGNOSIS — I5033 Acute on chronic diastolic (congestive) heart failure: Secondary | ICD-10-CM | POA: Diagnosis not present

## 2018-04-06 DIAGNOSIS — I110 Hypertensive heart disease with heart failure: Secondary | ICD-10-CM | POA: Diagnosis not present

## 2018-04-06 DIAGNOSIS — I5033 Acute on chronic diastolic (congestive) heart failure: Secondary | ICD-10-CM | POA: Diagnosis not present

## 2018-04-09 DIAGNOSIS — I5033 Acute on chronic diastolic (congestive) heart failure: Secondary | ICD-10-CM | POA: Diagnosis not present

## 2018-04-09 DIAGNOSIS — I110 Hypertensive heart disease with heart failure: Secondary | ICD-10-CM | POA: Diagnosis not present

## 2018-04-11 DIAGNOSIS — I5033 Acute on chronic diastolic (congestive) heart failure: Secondary | ICD-10-CM | POA: Diagnosis not present

## 2018-04-11 DIAGNOSIS — I110 Hypertensive heart disease with heart failure: Secondary | ICD-10-CM | POA: Diagnosis not present

## 2018-04-12 DIAGNOSIS — I110 Hypertensive heart disease with heart failure: Secondary | ICD-10-CM | POA: Diagnosis not present

## 2018-04-12 DIAGNOSIS — I5033 Acute on chronic diastolic (congestive) heart failure: Secondary | ICD-10-CM | POA: Diagnosis not present

## 2018-04-13 ENCOUNTER — Ambulatory Visit: Payer: Medicare Other

## 2018-04-15 NOTE — Progress Notes (Signed)
Cardiology Office Note   Date:  04/16/2018   ID:  Rebecca Norman, DOB 1924/10/26, MRN [id number]  PCP:  [name], MD  Cardiologist: Dr. [name]  Chief Complaint  Patient presents with  . Congestive Heart Failure     History of Present Illness: Rebecca Norman is a 84 y.o. female who presents for ongoing assessment and management of chronic diastolic heart failure, PAF, hypertension, with other history to include hypothyroidism.  The patient has had episodes of atrial fibrillation with RVR documented in December 2018 with associated fatigue and weakness.  The patient had a cardioversion on 08/09/2017, and had maintain normal sinus rhythm with brief episodes of PAT noted on telemetry.  Amiodarone was added to her medication regimen, and diltiazem was discontinued due to hypotension, and Lopressor was decreased in dosage.    She was seen last by Almyra Deforest, PA, after hospitalization in the setting of pulmonary edema hospitalized.  She was diuresed to a dry weight of 158 pounds.  It was recommended that she decrease her Lasix to 40 mg in the a.m. and 20 mg in the p.m. if follow-up BMET revealed evidence of dehydration or elevated creatinine.  She remained on home O2.  Follow-up labs on 03/28/2018 revealed a sodium of 136, potassium 3.4, chloride 99, CO2 30, glucose 144, BUN 21, creatinine 0.98.  She is doing well, but has stopped taking the afternoon lasix. Her weight had been stable and she has had no LEE. She is otherwise medically compliant. Her daughter is a Immunologist and helps to take care of her. She denies cramping or chest pain. She is having some lethargy on the metoprolol.   Past Medical History:  Diagnosis Date  . DDD (degenerative disc disease), cervical    severe/  auto fusion c2-c5  . Diverticulosis   . Hemorrhoids   . History of adenomatous polyp of colon    1995  adenomatous polyp/  1997 & 2003  hyperplastic polyp's  . History of bladder cancer urologist-  dr [name]   s/p   TURBT's  . History of cancer of ureter   . Hypertension   . Hypothyroid   . Nocturia   . Osteoarthritis   . Osteopenia     Past Surgical History:  Procedure Laterality Date  . BACK SURGERY    . CARDIOVERSION N/A 08/09/2017   Procedure: CARDIOVERSION;  Surgeon: Larey Dresser, MD;  [facility];  Service: Cardiovascular;  Laterality: N/A;  . CATARACT EXTRACTION W/ INTRAOCULAR LENS  IMPLANT, BILATERAL Bilateral   . CYSTOSCOPY W/ RETROGRADES  10/03/2011   Procedure: CYSTOSCOPY WITH RETROGRADE PYELOGRAM;  Surgeon: Molli Hazard, MD;  Location: [facility];  Service: Urology;;  . Consuela Mimes W/ RETROGRADES Bilateral 06/17/2016   Procedure: CYSTOSCOPY WITH RETROGRADE PYELOGRAM;  Surgeon: Alexis Frock, MD;  [facility];  Service: Urology;  Laterality: Bilateral;  . CYSTOSCOPY WITH BIOPSY N/A 06/17/2016   Procedure: CYSTOSCOPY WITH BIOPSY AND FULGERATION;  Surgeon: Alexis Frock, MD;  Location: [facility];  Service: Urology;  Laterality: N/A;  . LAMINECTOMY  2009   L3 - 4  . ORIF ANKLE FRACTURE Left 08/28/2017   Procedure: OPEN REDUCTION INTERNAL FIXATION (ORIF) ANKLE FRACTURE;  Surgeon: Shona Needles, MD;  Location: [facility] OR[facility];  Service: Orthopedics;  Laterality: Left;  . TRANSURETHRAL RESECTION OF BLADDER  x3 --  2009; 2010; 06-29-2010  . TRANSURETHRAL RESECTION OF BLADDER TUMOR  10/03/2011   Procedure: TRANSURETHRAL RESECTION OF BLADDER TUMOR (  TURBT);  Surgeon: Molli Hazard, MD;  Location: [facility];  Service: Urology;  Laterality: N/A;     Current Outpatient Medications  Medication Sig Dispense Refill  . acetaminophen (TYLENOL) 500 MG tablet Take 500 mg by mouth every 6 (six) hours as needed for headache (pain).     Marland Kitchen amiodarone (PACERONE) 200 MG tablet Take 1 tablet (200 mg total) by mouth daily. 30 tablet 8  . apixaban (ELIQUIS) 5 MG TABS tablet Take 1 tablet (5 mg total) by mouth 2  (two) times daily. 180 tablet 1  . furosemide (LASIX) 20 MG tablet Take 40 mg (2 tabs) in a.m. And take 20mg  (1 tab) in the afternoon 90 tablet 0  . levothyroxine (SYNTHROID, LEVOTHROID) 50 MCG tablet Take 50-75 mcg by mouth See admin instructions. Take every other alternating between 92mcg and 20mcg.    . metoprolol tartrate (LOPRESSOR) 25 MG tablet Take 0.5 tablets (12.5 mg total) by mouth 2 (two) times daily.    . ondansetron (ZOFRAN) 4 MG tablet Take 4 mg by mouth every 6 (six) hours as needed for nausea or vomiting.    . potassium chloride 20 MEQ TBCR Take 20 mEq by mouth daily. 30 tablet 0  . vitamin B-12 (CYANOCOBALAMIN) 1000 MCG tablet Take 1 tablet (1,000 mcg total) by mouth daily.     No current facility-administered medications for this visit.     Allergies:   Flagyl [metronidazole] and Lisinopril    Social History:  The patient  reports that she quit smoking about 12 years ago. Her smoking use included cigarettes. She quit after 10.00 years of use. She has never used smokeless tobacco. She reports that she drinks alcohol. She reports that she does not use drugs.   Family History:  The patient's family history includes Breast cancer in her sister; Hernia (age of onset: 20) in her sister; Stomach cancer in her sister.    ROS: All other systems are reviewed and negative. Unless otherwise mentioned in H&P    PHYSICAL EXAM: VS:  BP (!) 154/61   Pulse (!) 50   Ht 5\' 3"  (1.6 m)   Wt 159 lb (72.1 kg)   SpO2 94%   BMI 28.17 kg/m  , BMI Body mass index is 28.17 kg/m. GEN: Well nourished, well developed, in no acute distress Sitting in a wheelchair.  HEENT: normal  Neck: no JVD, carotid bruits, or masses Cardiac: QHU;7/6 systolic murmurs, rubs, or gallops,no edema  Respiratory:  Clear to auscultation bilaterally, normal work of breathing, on O2 via  at 2 liters.  GI: soft, nontender, nondistended, + BS MS: no deformity or atrophy  Skin: warm and dry, no rash Neuro:  Strength  and sensation are intact Psych: euthymic mood, full affect   EKG: Not completed this office visit.   Recent Labs: 02/08/2018: ALT 8; TSH 2.958 03/25/2018: B Natriuretic Peptide 945.3 03/26/2018: Hemoglobin 9.5; Platelets 198 03/28/2018: BUN 21; Creatinine, Ser 0.98; Magnesium 2.2; Potassium 3.4; Sodium 136    Lipid Panel No results found for: CHOL, TRIG, HDL, CHOLHDL, VLDL, LDLCALC, LDLDIRECT    Wt Readings from Last 3 Encounters:  04/16/18 159 lb (72.1 kg)  03/28/18 158 lb 4.6 oz (71.8 kg)  03/15/18 161 lb (73 kg)      Other studies Reviewed: Echocardiogram 2017-12-03  Left ventricle: The cavity size was normal. There was severe   concentric hypertrophy. Systolic function was vigorous. The   estimated ejection fraction was in the range of 65% to 70%.  Wall   motion was normal; there were no regional wall motion   abnormalities. Doppler parameters are consistent with high   ventricular filling pressure. - Aortic valve: There was very mild stenosis. There was trivial   regurgitation. Peak velocity (S): 215 cm/s. Mean gradient (S): 11   mm Hg. Valve area (Vmax): 1.94 cm^2. - Mitral valve: Severely calcified annulus. Mildly thickened   leaflets . There was mild regurgitation. - Left atrium: The atrium was moderately dilated.  ASSESSMENT AND PLAN:  1. PAT: She remains on amiodarone and Eliquis 5 mg BID. She is also on metoprolol tartrate 12.5 mg BID. She is having some fatigue on the BB. I will decrease it even further to 12.5 succinate dose daily. Might be able to wean off BB eventually.   2 Pulmonary Edema: Dry weight is 158 lbs. She is weighing daily at home.  Weight runs 156-157 lbs. She is not taking pm dose of lasix. I have advised her to take the pm dose if she sees her weight going up to avoid decompensation. Otherwise continue daily 40 mg dose.   3. Hypertension: BP is slightly elevated this office visit. Will monitor   Current medicines are reviewed at length with the  patient today.    Labs/ tests ordered today include: BMET in 3 months.   Phill Myron. West Pugh, ANP, AACC   04/16/2018 1:36 PM    [facility] [address] [place], [place] [address] Phone: [phone]; Fax: [phone]

## 2018-04-16 ENCOUNTER — Ambulatory Visit (INDEPENDENT_AMBULATORY_CARE_PROVIDER_SITE_OTHER): Payer: Medicare Other | Admitting: Adult Health

## 2018-04-16 ENCOUNTER — Encounter: Payer: Self-pay | Admitting: Adult Health

## 2018-04-16 VITALS — BP 154/61 | HR 50 | Ht 63.0 in | Wt 159.0 lb

## 2018-04-16 DIAGNOSIS — I10 Essential (primary) hypertension: Secondary | ICD-10-CM

## 2018-04-16 DIAGNOSIS — I480 Paroxysmal atrial fibrillation: Secondary | ICD-10-CM

## 2018-04-16 DIAGNOSIS — Z79899 Other long term (current) drug therapy: Secondary | ICD-10-CM

## 2018-04-16 DIAGNOSIS — I5032 Chronic diastolic (congestive) heart failure: Secondary | ICD-10-CM

## 2018-04-16 MED ORDER — FUROSEMIDE 20 MG PO TABS: ORAL_TABLET | ORAL | 0 refills | Status: DC

## 2018-04-16 MED ORDER — METOPROLOL SUCCINATE ER 25 MG PO TB24: 12.5000 mg | ORAL_TABLET | Freq: Every day | ORAL | 1 refills | Status: DC

## 2018-04-16 NOTE — Patient Instructions (Signed)
Medication Instructions:   START METOPROLOL SUCCINATE 12.5MG  DAILY  If you need a refill on your cardiac medications before your next appointment, please call your pharmacy.  Labwork: BMET 3-5 DAYS BEFORE FOLLOW UP APPOINTMENT HERE IN OUR OFFICE AT [facility]  Take the provided lab slips with you to the lab for your blood draw.   You will NOT need to fast   Follow-Up: Your physician wants you to follow-up in: [place] ([place]), DNP,AACC IF PRIMARY CARDIOLOGIST IS UNAVAILABLE.    Thank you for choosing [facility]!!

## 2018-04-17 ENCOUNTER — Telehealth: Payer: Self-pay | Admitting: Adult Health

## 2018-04-17 DIAGNOSIS — I110 Hypertensive heart disease with heart failure: Secondary | ICD-10-CM | POA: Diagnosis not present

## 2018-04-17 DIAGNOSIS — I5033 Acute on chronic diastolic (congestive) heart failure: Secondary | ICD-10-CM | POA: Diagnosis not present

## 2018-04-17 MED ORDER — POTASSIUM CHLORIDE ER 20 MEQ PO TBCR: 20.0000 meq | EXTENDED_RELEASE_TABLET | Freq: Every day | ORAL | 0 refills | Status: DC

## 2018-04-17 NOTE — Telephone Encounter (Signed)
New message    *STAT* If patient is at the pharmacy, call can be transferred to refill team.   1. Which medications need to be refilled? (please list name of each medication and dose if known) potassium chloride 20 MEQ TBCR  2. Which pharmacy/location (including street and city if local pharmacy) is medication to be sent to?[facility] [place], [place]  3. Do they need a 30 day or 90 day supply? [place]

## 2018-04-19 DIAGNOSIS — I110 Hypertensive heart disease with heart failure: Secondary | ICD-10-CM | POA: Diagnosis not present

## 2018-04-19 DIAGNOSIS — I5033 Acute on chronic diastolic (congestive) heart failure: Secondary | ICD-10-CM | POA: Diagnosis not present

## 2018-04-23 DIAGNOSIS — I110 Hypertensive heart disease with heart failure: Secondary | ICD-10-CM | POA: Diagnosis not present

## 2018-04-23 DIAGNOSIS — I5033 Acute on chronic diastolic (congestive) heart failure: Secondary | ICD-10-CM | POA: Diagnosis not present

## 2018-04-25 DIAGNOSIS — I5033 Acute on chronic diastolic (congestive) heart failure: Secondary | ICD-10-CM | POA: Diagnosis not present

## 2018-04-25 DIAGNOSIS — I110 Hypertensive heart disease with heart failure: Secondary | ICD-10-CM | POA: Diagnosis not present

## 2018-04-30 DIAGNOSIS — N8111 Cystocele, midline: Secondary | ICD-10-CM | POA: Diagnosis not present

## 2018-04-30 DIAGNOSIS — C670 Malignant neoplasm of trigone of bladder: Secondary | ICD-10-CM | POA: Diagnosis not present

## 2018-05-01 DIAGNOSIS — I110 Hypertensive heart disease with heart failure: Secondary | ICD-10-CM | POA: Diagnosis not present

## 2018-05-01 DIAGNOSIS — I5033 Acute on chronic diastolic (congestive) heart failure: Secondary | ICD-10-CM | POA: Diagnosis not present

## 2018-06-01 DIAGNOSIS — I4891 Unspecified atrial fibrillation: Secondary | ICD-10-CM | POA: Diagnosis not present

## 2018-06-01 DIAGNOSIS — F322 Major depressive disorder, single episode, severe without psychotic features: Secondary | ICD-10-CM | POA: Diagnosis not present

## 2018-06-01 DIAGNOSIS — I5030 Unspecified diastolic (congestive) heart failure: Secondary | ICD-10-CM | POA: Diagnosis not present

## 2018-06-01 DIAGNOSIS — R001 Bradycardia, unspecified: Secondary | ICD-10-CM | POA: Diagnosis not present

## 2018-06-01 DIAGNOSIS — E039 Hypothyroidism, unspecified: Secondary | ICD-10-CM | POA: Diagnosis not present

## 2018-06-01 DIAGNOSIS — I110 Hypertensive heart disease with heart failure: Secondary | ICD-10-CM | POA: Diagnosis not present

## 2018-06-01 DIAGNOSIS — D519 Vitamin B12 deficiency anemia, unspecified: Secondary | ICD-10-CM | POA: Diagnosis not present

## 2018-06-01 DIAGNOSIS — Z23 Encounter for immunization: Secondary | ICD-10-CM | POA: Diagnosis not present

## 2018-06-18 ENCOUNTER — Other Ambulatory Visit: Payer: Self-pay | Admitting: Adult Health

## 2018-06-18 NOTE — Telephone Encounter (Signed)
Rx(s) sent to pharmacy electronically.  

## 2018-06-25 ENCOUNTER — Ambulatory Visit: Payer: Medicare Other | Admitting: Cardiology

## 2018-07-03 ENCOUNTER — Telehealth: Payer: Self-pay | Admitting: Cardiology

## 2018-07-03 MED ORDER — AMIODARONE HCL 200 MG PO TABS: 200.0000 mg | ORAL_TABLET | Freq: Every day | ORAL | 2 refills | Status: DC

## 2018-07-03 NOTE — Telephone Encounter (Signed)
Called patient notified that Rx was sent to pharmacy.

## 2018-07-03 NOTE — Telephone Encounter (Signed)
New Message   Pt c/o medication issue:  1. Name of Medication: amiodarone (PACERONE) 200 MG tablet  2. How are you currently taking this medication (dosage and times per day)? Take 1 tablet (200 mg total) by mouth daily  3. Are you having a reaction (difficulty breathing--STAT)? no  4. What is your medication issue? Pt states she is wanting this medication switch to [facility] [place] [place]

## 2018-07-05 NOTE — Progress Notes (Deleted)
HPI: Follow-up atrial fibrillation and chronic diastolic CHF.  Monitor October 2018 showed intermittent atrial fibrillation with rapid ventricular response.  Patient admitted December 2018 with atrial fibrillation and acute diastolic congestive heart failure.  She underwent cardioversion.  Last echocardiogram March 2019 showed vigorous LV systolic function, mild aortic stenosis with mean gradient 11 mmHg, trace aortic insufficiency, mild mitral regurgitation and moderate left atrial enlargement. Since last seen,   Current Outpatient Medications  Medication Sig Dispense Refill  . acetaminophen (TYLENOL) 500 MG tablet Take 500 mg by mouth every 6 (six) hours as needed for headache (pain).     Marland Kitchen amiodarone (PACERONE) 200 MG tablet Take 1 tablet (200 mg total) by mouth daily. 90 tablet 2  . apixaban (ELIQUIS) 5 MG TABS tablet Take 1 tablet (5 mg total) by mouth 2 (two) times daily. 180 tablet 1  . furosemide (LASIX) 20 MG tablet Take 40 mg (2 tabs) in a.m. And take 20mg  (1 tab) in the PM-PRN 90 tablet 0  . levothyroxine (SYNTHROID, LEVOTHROID) 50 MCG tablet Take 50-75 mcg by mouth See admin instructions. Take every other alternating between 18mcg and 61mcg.    . metoprolol succinate (TOPROL XL) 25 MG 24 hr tablet Take 0.5 tablets (12.5 mg total) by mouth daily. 30 tablet 1  . ondansetron (ZOFRAN) 4 MG tablet Take 4 mg by mouth every 6 (six) hours as needed for nausea or vomiting.    . potassium chloride SA (K-DUR,KLOR-CON) 20 MEQ tablet Take 1 tablet (20 mEq total) by mouth daily. 90 tablet 3  . vitamin B-12 (CYANOCOBALAMIN) 1000 MCG tablet Take 1 tablet (1,000 mcg total) by mouth daily.     No current facility-administered medications for this visit.      Past Medical History:  Diagnosis Date  . DDD (degenerative disc disease), cervical    severe/  auto fusion c2-c5  . Diverticulosis   . Hemorrhoids   . History of adenomatous polyp of colon    1995  adenomatous polyp/  1997 & 2003   hyperplastic polyp's  . History of bladder cancer urologist-  dr [name]   s/p  TURBT's  . History of cancer of ureter   . Hypertension   . Hypothyroid   . Nocturia   . Osteoarthritis   . Osteopenia     Past Surgical History:  Procedure Laterality Date  . BACK SURGERY    . CARDIOVERSION N/A 08/09/2017   Procedure: CARDIOVERSION;  Surgeon: Larey Dresser, MD;  Location: [facility] ENDOSCOPY;  Service: Cardiovascular;  Laterality: N/A;  . CATARACT EXTRACTION W/ INTRAOCULAR LENS  IMPLANT, BILATERAL Bilateral   . CYSTOSCOPY W/ RETROGRADES  10/03/2011   Procedure: CYSTOSCOPY WITH RETROGRADE PYELOGRAM;  Surgeon: Molli Hazard, MD;  Location: [facility];  Service: Urology;;  . Consuela Mimes W/ RETROGRADES Bilateral 06/17/2016   Procedure: CYSTOSCOPY WITH RETROGRADE PYELOGRAM;  Surgeon: Alexis Frock, MD;  Location: [facility];  Service: Urology;  Laterality: Bilateral;  . CYSTOSCOPY WITH BIOPSY N/A 06/17/2016   Procedure: CYSTOSCOPY WITH BIOPSY AND FULGERATION;  Surgeon: Alexis Frock, MD;  Location: [facility];  Service: Urology;  Laterality: N/A;  . LAMINECTOMY  2009   L3 - 4  . ORIF ANKLE FRACTURE Left 08/28/2017   Procedure: OPEN REDUCTION INTERNAL FIXATION (ORIF) ANKLE FRACTURE;  Surgeon: Shona Needles, MD;  Location: [facility];  Service: Orthopedics;  Laterality: Left;  . TRANSURETHRAL RESECTION OF BLADDER  x3 --  2009; 2010; 06-29-2010  .  TRANSURETHRAL RESECTION OF BLADDER TUMOR  10/03/2011   Procedure: TRANSURETHRAL RESECTION OF BLADDER TUMOR (TURBT);  Surgeon: Molli Hazard, MD;  Location: [facility];  Service: Urology;  Laterality: N/A;    Social History   Socioeconomic History  . Marital status: Widowed    Spouse name: Not on file  . Number of children: Not on file  . Years of education: Not on file  . Highest education level: Not on file  Occupational History  . Occupation: Retired  Scientific laboratory technician  .  Financial resource strain: Not on file  . Food insecurity:    Worry: Not on file    Inability: Not on file  . Transportation needs:    Medical: Not on file    Non-medical: Not on file  Tobacco Use  . Smoking status: Former Smoker    Years: 10.00    Types: Cigarettes    Last attempt to quit: 09/25/2005    Years since quitting: 12.7  . Smokeless tobacco: Never Used  Substance and Sexual Activity  . Alcohol use: Yes  . Drug use: No  . Sexual activity: Not on file  Lifestyle  . Physical activity:    Days per week: Not on file    Minutes per session: Not on file  . Stress: Not on file  Relationships  . Social connections:    Talks on phone: Not on file    Gets together: Not on file    Attends religious service: Not on file    Active member of club or organization: Not on file    Attends meetings of clubs or organizations: Not on file    Relationship status: Not on file  . Intimate partner violence:    Fear of current or ex partner: Not on file    Emotionally abused: Not on file    Physically abused: Not on file    Forced sexual activity: Not on file  Other Topics Concern  . Not on file  Social History Narrative  . Not on file    Family History  Problem Relation Age of Onset  . Breast cancer Sister   . Stomach cancer Sister   . Hernia Sister 47       HERNIA SURGERY COMPLICATIONS  . Colon cancer Neg Hx     ROS: no fevers or chills, productive cough, hemoptysis, dysphasia, odynophagia, melena, hematochezia, dysuria, hematuria, rash, seizure activity, orthopnea, PND, pedal edema, claudication. Remaining systems are negative.  Physical Exam: Well-developed well-nourished in no acute distress.  Skin is warm and dry.  HEENT is normal.  Neck is supple.  Chest is clear to auscultation with normal expansion.  Cardiovascular exam is regular rate and rhythm.  Abdominal exam nontender or distended. No masses palpated. Extremities show no edema. neuro grossly intact  ECG-  personally reviewed  A/P  1  Kirk Ruths, MD

## 2018-07-17 ENCOUNTER — Ambulatory Visit: Payer: Medicare Other | Admitting: Cardiology

## 2018-08-06 DIAGNOSIS — I4891 Unspecified atrial fibrillation: Secondary | ICD-10-CM | POA: Diagnosis not present

## 2018-08-06 DIAGNOSIS — D519 Vitamin B12 deficiency anemia, unspecified: Secondary | ICD-10-CM | POA: Diagnosis not present

## 2018-08-06 DIAGNOSIS — E039 Hypothyroidism, unspecified: Secondary | ICD-10-CM | POA: Diagnosis not present

## 2018-08-06 DIAGNOSIS — F322 Major depressive disorder, single episode, severe without psychotic features: Secondary | ICD-10-CM | POA: Diagnosis not present

## 2018-08-06 DIAGNOSIS — I5030 Unspecified diastolic (congestive) heart failure: Secondary | ICD-10-CM | POA: Diagnosis not present

## 2018-08-06 DIAGNOSIS — J9691 Respiratory failure, unspecified with hypoxia: Secondary | ICD-10-CM | POA: Diagnosis not present

## 2018-08-06 DIAGNOSIS — I110 Hypertensive heart disease with heart failure: Secondary | ICD-10-CM | POA: Diagnosis not present

## 2018-08-06 DIAGNOSIS — D649 Anemia, unspecified: Secondary | ICD-10-CM | POA: Diagnosis not present

## 2018-09-18 NOTE — Progress Notes (Signed)
HPI: FU chronic diastolic CHF, PAF and hypertension. Monitor 10/18 showed PAF. Echo 3/19 showed EF 65-70, mild AS with mean gradient 11 mmHg, mild MR, moderate LAE. She is maintained on amiodarone. Since last seen, she has some dyspnea on exertion but no orthopnea, PND, chest pain or syncope.  No bleeding.  Occasional minimal pedal edema.  Current Outpatient Medications  Medication Sig Dispense Refill  . acetaminophen (TYLENOL) 500 MG tablet Take 500 mg by mouth every 6 (six) hours as needed for headache (pain).     Marland Kitchen amiodarone (PACERONE) 200 MG tablet Take 1 tablet (200 mg total) by mouth daily. 90 tablet 2  . apixaban (ELIQUIS) 5 MG TABS tablet Take 1 tablet (5 mg total) by mouth 2 (two) times daily. 180 tablet 1  . furosemide (LASIX) 20 MG tablet Take 40 mg (2 tabs) in a.m. And take 20mg  (1 tab) in the PM-PRN (Patient taking differently: Take 40 mg by mouth every morning. Take 40 mg (2 tabs) in a.m. And take 20mg  (1 tab) in the PM-PRN) 90 tablet 0  . levothyroxine (SYNTHROID, LEVOTHROID) 50 MCG tablet Take 50-75 mcg by mouth See admin instructions. Take every other alternating between 71mcg and 54mcg.    . potassium chloride SA (K-DUR,KLOR-CON) 20 MEQ tablet Take 1 tablet (20 mEq total) by mouth daily. 90 tablet 3  . vitamin B-12 (CYANOCOBALAMIN) 1000 MCG tablet Take 1 tablet (1,000 mcg total) by mouth daily.     No current facility-administered medications for this visit.      Past Medical History:  Diagnosis Date  . DDD (degenerative disc disease), cervical    severe/  auto fusion c2-c5  . Diverticulosis   . Hemorrhoids   . History of adenomatous polyp of colon    1995  adenomatous polyp/  1997 & 2003  hyperplastic polyp's  . History of bladder cancer urologist-  dr [name]   s/p  TURBT's  . History of cancer of ureter   . Hypertension   . Hypothyroid   . Nocturia   . Osteoarthritis   . Osteopenia     Past Surgical History:  Procedure Laterality Date  . BACK SURGERY     . CARDIOVERSION N/A 08/09/2017   Procedure: CARDIOVERSION;  Surgeon: Larey Dresser, MD;  Location: [facility] ENDOSCOPY;  Service: Cardiovascular;  Laterality: N/A;  . CATARACT EXTRACTION W/ INTRAOCULAR LENS  IMPLANT, BILATERAL Bilateral   . CYSTOSCOPY W/ RETROGRADES  10/03/2011   Procedure: CYSTOSCOPY WITH RETROGRADE PYELOGRAM;  Surgeon: Molli Hazard, MD;  Location: [facility];  Service: Urology;;  . Consuela Mimes W/ RETROGRADES Bilateral 06/17/2016   Procedure: CYSTOSCOPY WITH RETROGRADE PYELOGRAM;  Surgeon: Alexis Frock, MD;  Location: [facility];  Service: Urology;  Laterality: Bilateral;  . CYSTOSCOPY WITH BIOPSY N/A 06/17/2016   Procedure: CYSTOSCOPY WITH BIOPSY AND FULGERATION;  Surgeon: Alexis Frock, MD;  Location: [facility];  Service: Urology;  Laterality: N/A;  . LAMINECTOMY  2009   L3 - 4  . ORIF ANKLE FRACTURE Left 08/28/2017   Procedure: OPEN REDUCTION INTERNAL FIXATION (ORIF) ANKLE FRACTURE;  Surgeon: Shona Needles, MD;  Location: [facility];  Service: Orthopedics;  Laterality: Left;  . TRANSURETHRAL RESECTION OF BLADDER  x3 --  2009; 2010; 06-29-2010  . TRANSURETHRAL RESECTION OF BLADDER TUMOR  10/03/2011   Procedure: TRANSURETHRAL RESECTION OF BLADDER TUMOR (TURBT);  Surgeon: Molli Hazard, MD;  Location: [facility];  Service: Urology;  Laterality: N/A;  Social History   Socioeconomic History  . Marital status: Widowed    Spouse name: Not on file  . Number of children: Not on file  . Years of education: Not on file  . Highest education level: Not on file  Occupational History  . Occupation: Retired  Scientific laboratory technician  . Financial resource strain: Not on file  . Food insecurity:    Worry: Not on file    Inability: Not on file  . Transportation needs:    Medical: Not on file    Non-medical: Not on file  Tobacco Use  . Smoking status: Former Smoker    Years: 10.00    Types: Cigarettes    Last  attempt to quit: 09/25/2005    Years since quitting: 12.9  . Smokeless tobacco: Never Used  Substance and Sexual Activity  . Alcohol use: Yes  . Drug use: No  . Sexual activity: Not on file  Lifestyle  . Physical activity:    Days per week: Not on file    Minutes per session: Not on file  . Stress: Not on file  Relationships  . Social connections:    Talks on phone: Not on file    Gets together: Not on file    Attends religious service: Not on file    Active member of club or organization: Not on file    Attends meetings of clubs or organizations: Not on file    Relationship status: Not on file  . Intimate partner violence:    Fear of current or ex partner: Not on file    Emotionally abused: Not on file    Physically abused: Not on file    Forced sexual activity: Not on file  Other Topics Concern  . Not on file  Social History Narrative  . Not on file    Family History  Problem Relation Age of Onset  . Breast cancer Sister   . Stomach cancer Sister   . Hernia Sister 67       HERNIA SURGERY COMPLICATIONS  . Colon cancer Neg Hx     ROS: no fevers or chills, productive cough, hemoptysis, dysphasia, odynophagia, melena, hematochezia, dysuria, hematuria, rash, seizure activity, orthopnea, PND, pedal edema, claudication. Remaining systems are negative.  Physical Exam: Well-developed well-nourished in no acute distress.  Skin is warm and dry.  HEENT is normal.  Neck is supple.  Chest is clear to auscultation with normal expansion.  Cardiovascular exam is regular rate and rhythm.  Abdominal exam nontender or distended. No masses palpated. Extremities show no edema. neuro grossly intact  A/P  1 chronic diastolic congestive heart failure-patient appears to be euvolemic on examination.  We will continue with present dose of Lasix.  Continue fluid restriction and low-sodium diet.  She will weigh daily and take an additional 20 mg of Lasix for weight gain of 2 pounds.  Check  potassium and renal function.  2 paroxysmal atrial fibrillation-patient is in sinus rhythm on examination.  Continue amiodarone.  Continue apixaban.  Check chest x-ray.  Will obtain most recent hemoglobin, renal function, liver functions and TSH from primary care.  3 hypertension-patient's blood pressure is controlled today.  Continue present medications and follow.  Kirk Ruths, MD

## 2018-09-21 ENCOUNTER — Ambulatory Visit (HOSPITAL_COMMUNITY)
Admission: RE | Admit: 2018-09-21 | Discharge: 2018-09-21 | Disposition: A | Discharge status: Home / Self Care | Payer: Medicare Other | Source: Ambulatory Visit | Attending: Cardiology | Admitting: Cardiology

## 2018-09-21 ENCOUNTER — Ambulatory Visit (INDEPENDENT_AMBULATORY_CARE_PROVIDER_SITE_OTHER): Payer: Medicare Other | Admitting: Cardiology

## 2018-09-21 ENCOUNTER — Encounter: Payer: Self-pay | Admitting: Cardiology

## 2018-09-21 ENCOUNTER — Other Ambulatory Visit: Payer: Self-pay | Admitting: *Deleted

## 2018-09-21 ENCOUNTER — Encounter

## 2018-09-21 VITALS — BP 142/70 | HR 56 | Ht 63.0 in | Wt 164.0 lb

## 2018-09-21 DIAGNOSIS — R0602 Shortness of breath: Secondary | ICD-10-CM | POA: Diagnosis not present

## 2018-09-21 DIAGNOSIS — I10 Essential (primary) hypertension: Secondary | ICD-10-CM

## 2018-09-21 DIAGNOSIS — I480 Paroxysmal atrial fibrillation: Secondary | ICD-10-CM

## 2018-09-21 DIAGNOSIS — Z961 Presence of intraocular lens: Secondary | ICD-10-CM | POA: Diagnosis not present

## 2018-09-21 DIAGNOSIS — H353131 Nonexudative age-related macular degeneration, bilateral, early dry stage: Secondary | ICD-10-CM | POA: Diagnosis not present

## 2018-09-21 DIAGNOSIS — I5032 Chronic diastolic (congestive) heart failure: Secondary | ICD-10-CM | POA: Diagnosis not present

## 2018-09-21 DIAGNOSIS — H35372 Puckering of macula, left eye: Secondary | ICD-10-CM | POA: Diagnosis not present

## 2018-09-21 MED ORDER — APIXABAN 5 MG PO TABS: 5.0000 mg | ORAL_TABLET | Freq: Two times a day (BID) | ORAL | 0 refills | Status: DC

## 2018-09-21 NOTE — Patient Instructions (Signed)
Medication Instructions:  NO CHANGE If you need a refill on your cardiac medications before your next appointment, please call your pharmacy.   Lab work: If you have labs (blood work) drawn today and your tests are completely normal, you will receive your results only by: Marland Kitchen MyChart Message (if you have MyChart) OR . A paper copy in the mail If you have any lab test that is abnormal or we need to change your treatment, we will call you to review the results.  Testing/Procedures: A chest x-ray takes a picture of the organs and structures inside the chest, including the heart, lungs, and blood vessels. This test can show several things, including, whether the heart is enlarges; whether fluid is building up in the lungs; and whether pacemaker / defibrillator leads are still in place. [place]  Follow-Up: At [facility], you and your health needs are our priority.  As part of our continuing mission to provide you with exceptional heart care, we have created designated Provider Care Teams.  These Care Teams include your primary Cardiologist (physician) and Advanced Practice Providers (APPs -  Physician Assistants and Nurse Practitioners) who all work together to provide you with the care you need, when you need it. You will need a follow up appointment in 6 months.  Please call our office 2 months in advance to schedule this appointment.  You may see Kirk Ruths, MD or one of the following Advanced Practice Providers on your designated Care Team:   Kerin Ransom, PA-C Roby Lofts, [place] . Sande Rives, PA-C CALL IN MAY TO SCHEDULE APPOINTMENT IN [place]

## 2018-09-25 ENCOUNTER — Ambulatory Visit (INDEPENDENT_AMBULATORY_CARE_PROVIDER_SITE_OTHER): Payer: Medicare Other | Admitting: Podiatry

## 2018-09-25 ENCOUNTER — Encounter: Payer: Self-pay | Admitting: Podiatry

## 2018-09-25 VITALS — BP 147/70 | HR 59

## 2018-09-25 DIAGNOSIS — M79674 Pain in right toe(s): Secondary | ICD-10-CM

## 2018-09-25 DIAGNOSIS — B351 Tinea unguium: Secondary | ICD-10-CM

## 2018-09-25 DIAGNOSIS — M79675 Pain in left toe(s): Secondary | ICD-10-CM

## 2018-09-25 NOTE — Progress Notes (Signed)
This patient presents to the office for treatment of her long thick nails.  She says she broke her left ankle which required rehabilitation .  She was unable to trim her nails during this time, approximately 9 months.  She presents to the office today for preventative foot care services.  Patient is taking eliquiss.  General Appearance  Alert, conversant and in no acute stress.  Vascular  Dorsalis pedis  are palpable  bilaterally. {Posterior tibial pulses are non palpable bilateral. Capillary return is within normal limits  bilaterally. Temperature is within normal limits  bilaterally.  Neurologic  Senn-Weinstein monofilament wire test within normal limits  bilaterally. Muscle power within normal limits bilaterally.  Nails Thick disfigured discolored nails with subungual debris  from hallux to fifth toes bilaterally. No evidence of bacterial infection or drainage bilaterally.  Orthopedic  No limitations of motion  feet .  No crepitus or effusions noted.  HAV  B/L.  Overlapping second toe right foot.  Skin  normotropic skin with no porokeratosis noted bilaterally.  No signs of infections or ulcers noted.    Onychomycosis  B/L  IE.  Debride nails  X 10.  RTC 3 months.   Gardiner Barefoot DPM

## 2018-10-23 DIAGNOSIS — C670 Malignant neoplasm of trigone of bladder: Secondary | ICD-10-CM | POA: Diagnosis not present

## 2018-10-23 DIAGNOSIS — N8111 Cystocele, midline: Secondary | ICD-10-CM | POA: Diagnosis not present

## 2018-10-26 ENCOUNTER — Telehealth: Payer: Self-pay | Admitting: Cardiology

## 2018-10-26 NOTE — Telephone Encounter (Signed)
° ° ° ° ° ° °  1. Which medications need to be refilled? (please list name of each medication and dose if known) apixaban (ELIQUIS) 5 MG TABS tablet  2. Which pharmacy/location (including street and city if local pharmacy) is medication to be sent to? [facility] [place], [place]  3. Do they need a 30 day or 90 day supply? [place]

## 2018-10-26 NOTE — Telephone Encounter (Signed)
° ° °  Patient calling the office for samples of medication:   1.  What medication and dosage are you requesting samples for? Eliquis  2.  Are you currently out of this medication? Enough for 2 days

## 2018-10-26 NOTE — Telephone Encounter (Signed)
Eliquis 5 mg 1 box lot #[id number] [facility] 6/22 at front for pick up, patient aware.

## 2018-10-29 MED ORDER — APIXABAN 5 MG PO TABS: 5.0000 mg | ORAL_TABLET | Freq: Two times a day (BID) | ORAL | 3 refills | Status: DC

## 2018-10-29 NOTE — Telephone Encounter (Signed)
Eliquis Rx sent in to [facility].

## 2018-10-29 NOTE — Telephone Encounter (Signed)
Pt called to say her son, Zondra Lawlor, will be picking up the sample tomorrow, 10/30/18

## 2018-11-06 ENCOUNTER — Telehealth: Payer: Self-pay | Admitting: Cardiology

## 2018-11-06 ENCOUNTER — Other Ambulatory Visit (HOSPITAL_COMMUNITY): Payer: Self-pay | Admitting: Nurse Practitioner

## 2018-11-06 NOTE — Telephone Encounter (Signed)
Spoke with patient and no samples at this office however [facility] office has some they will leave at front desk until she gets her mail order.

## 2018-11-06 NOTE — Telephone Encounter (Signed)
New Message   [facility] is calling [facility] doesn't know how long it will take to get her the Eliquis. She only has enough to last her until Friday.  Please call back

## 2018-12-25 ENCOUNTER — Ambulatory Visit: Payer: Medicare Other | Admitting: Podiatry

## 2018-12-26 DIAGNOSIS — S81801A Unspecified open wound, right lower leg, initial encounter: Secondary | ICD-10-CM | POA: Diagnosis not present

## 2019-01-09 DIAGNOSIS — I110 Hypertensive heart disease with heart failure: Secondary | ICD-10-CM | POA: Diagnosis not present

## 2019-01-09 DIAGNOSIS — D519 Vitamin B12 deficiency anemia, unspecified: Secondary | ICD-10-CM | POA: Diagnosis not present

## 2019-01-09 DIAGNOSIS — M150 Primary generalized (osteo)arthritis: Secondary | ICD-10-CM | POA: Diagnosis not present

## 2019-01-09 DIAGNOSIS — I4891 Unspecified atrial fibrillation: Secondary | ICD-10-CM | POA: Diagnosis not present

## 2019-01-09 DIAGNOSIS — F322 Major depressive disorder, single episode, severe without psychotic features: Secondary | ICD-10-CM | POA: Diagnosis not present

## 2019-01-09 DIAGNOSIS — I350 Nonrheumatic aortic (valve) stenosis: Secondary | ICD-10-CM | POA: Diagnosis not present

## 2019-01-09 DIAGNOSIS — Z0000 Encounter for general adult medical examination without abnormal findings: Secondary | ICD-10-CM | POA: Diagnosis not present

## 2019-01-09 DIAGNOSIS — I5030 Unspecified diastolic (congestive) heart failure: Secondary | ICD-10-CM | POA: Diagnosis not present

## 2019-01-09 DIAGNOSIS — E039 Hypothyroidism, unspecified: Secondary | ICD-10-CM | POA: Diagnosis not present

## 2019-01-09 DIAGNOSIS — C679 Malignant neoplasm of bladder, unspecified: Secondary | ICD-10-CM | POA: Diagnosis not present

## 2019-01-09 DIAGNOSIS — D649 Anemia, unspecified: Secondary | ICD-10-CM | POA: Diagnosis not present

## 2019-01-09 DIAGNOSIS — R809 Proteinuria, unspecified: Secondary | ICD-10-CM | POA: Diagnosis not present

## 2019-04-03 ENCOUNTER — Ambulatory Visit: Payer: Medicare Other | Admitting: Podiatry

## 2019-04-03 NOTE — Progress Notes (Signed)
Virtual Visit via Video Note   This visit type was conducted due to national recommendations for restrictions regarding the COVID-19 Pandemic (e.g. social distancing) in an effort to limit this patient's exposure and mitigate transmission in our community.  Due to her co-morbid illnesses, this patient is at least at moderate risk for complications without adequate follow up.  This format is felt to be most appropriate for this patient at this time.  All issues noted in this document were discussed and addressed.  A limited physical exam was performed with this format.  Please refer to the patient's chart for her consent to telehealth for Rebecca [facility].   Date:  04/04/2019   ID:  Rebecca [place], DOB 1925-03-29, MRN [id number]  Patient Location:Home Provider Location: Home  PCP:  [name], MD  Cardiologist:  Dr [name]  Evaluation Performed:  Follow-Up Visit  Chief Complaint:  FU atrial fibrillation  History of Present Illness:    FU chronic diastolic CHF, PAF and hypertension. Monitor 10/18 showed PAF. Echo 3/19 showed EF 65-70, mild AS with mean gradient 11 mmHg, mild MR, moderate LAE. She is maintained on amiodarone. Since last seen,  she denies dyspnea, chest pain, palpitations or syncope.  No bleeding.  The patient does not have symptoms concerning for COVID-19 infection (fever, chills, cough, or new shortness of breath).    Past Medical History:  Diagnosis Date  . DDD (degenerative disc disease), cervical    severe/  auto fusion c2-c5  . Diverticulosis   . Hemorrhoids   . History of adenomatous polyp of colon    1995  adenomatous polyp/  1997 & 2003  hyperplastic polyp's  . History of bladder cancer urologist-  dr [name]   s/p  TURBT's  . History of cancer of ureter   . Hypertension   . Hypothyroid   . Nocturia   . Osteoarthritis   . Osteopenia    Past Surgical History:  Procedure Laterality Date  . BACK SURGERY    . CARDIOVERSION N/A 08/09/2017   Procedure: CARDIOVERSION;  Surgeon: Larey Dresser, MD;  Location: [facility] ENDOSCOPY;  Service: Cardiovascular;  Laterality: N/A;  . CATARACT EXTRACTION W/ INTRAOCULAR LENS  IMPLANT, BILATERAL Bilateral   . CYSTOSCOPY W/ RETROGRADES  10/03/2011   Procedure: CYSTOSCOPY WITH RETROGRADE PYELOGRAM;  Surgeon: Molli Hazard, MD;  Location: [facility];  Service: Urology;;  . Consuela Mimes W/ RETROGRADES Bilateral 06/17/2016   Procedure: CYSTOSCOPY WITH RETROGRADE PYELOGRAM;  Surgeon: Alexis Frock, MD;  Location: [facility];  Service: Urology;  Laterality: Bilateral;  . CYSTOSCOPY WITH BIOPSY N/A 06/17/2016   Procedure: CYSTOSCOPY WITH BIOPSY AND FULGERATION;  Surgeon: Alexis Frock, MD;  Location: [facility];  Service: Urology;  Laterality: N/A;  . LAMINECTOMY  2009   L3 - 4  . ORIF ANKLE FRACTURE Left 08/28/2017   Procedure: OPEN REDUCTION INTERNAL FIXATION (ORIF) ANKLE FRACTURE;  Surgeon: Shona Needles, MD;  [facility];  Service: Orthopedics;  Laterality: Left;  . TRANSURETHRAL RESECTION OF BLADDER  x3 --  2009; 2010; 06-29-2010  . TRANSURETHRAL RESECTION OF BLADDER TUMOR  10/03/2011   Procedure: TRANSURETHRAL RESECTION OF BLADDER TUMOR (TURBT);  Surgeon: Molli Hazard, MD;  Location: [facility];  Service: Urology;  Laterality: N/A;     Current Meds  Medication Sig  . acetaminophen (TYLENOL) 500 MG tablet Take 500 mg by mouth every 6 (six) hours as needed for headache (pain).   Marland Kitchen amiodarone (PACERONE) 200  MG tablet Take 1 tablet (200 mg total) by mouth daily.  Marland Kitchen ELIQUIS 5 MG TABS tablet TAKE 1 TABLET TWICE A DAY  . furosemide (LASIX) 20 MG tablet Take 40 mg (2 tabs) in a.m. And take 20mg  (1 tab) in the PM-PRN (Patient taking differently: Take 40 mg by mouth every morning. Take 40 mg (2 tabs) in a.m. And take 20mg  (1 tab) in the PM-PRN)  . levothyroxine (SYNTHROID, LEVOTHROID) 50 MCG tablet Take 50-75 mcg by mouth See  admin instructions. Take every other alternating between 38mcg and 74mcg.  . OXYGEN Inhale into the lungs. 1-6 liters as directed  . potassium chloride SA (K-DUR,KLOR-CON) 20 MEQ tablet Take 1 tablet (20 mEq total) by mouth daily.  . sertraline (ZOLOFT) 50 MG tablet TK 1 T PO QD  . vitamin B-12 (CYANOCOBALAMIN) 1000 MCG tablet Take 1 tablet (1,000 mcg total) by mouth daily.     Allergies:   Flagyl [metronidazole] and Lisinopril   Social History   Tobacco Use  . Smoking status: Former Smoker    Years: 10.00    Types: Cigarettes    Quit date: 09/25/2005    Years since quitting: 13.5  . Smokeless tobacco: Never Used  Substance Use Topics  . Alcohol use: Yes  . Drug use: No     Family Hx: The patient's family history includes Breast cancer in her sister; Hernia (age of onset: 36) in her sister; Stomach cancer in her sister. There is no history of Colon cancer.  ROS:   Please see the history of present illness.    No Fever, chills  or productive cough All other systems reviewed and are negative.  Wt Readings from Last 3 Encounters:  04/04/19 168 lb (76.2 kg)  09/21/18 164 lb (74.4 kg)  04/16/18 159 lb (72.1 kg)     Objective:    Vital Signs:  Ht 5\' 3"  (1.6 m)   Wt 168 lb (76.2 kg)   BMI 29.76 kg/m    VITAL SIGNS:  reviewed NAD, on oxygen Answers questions appropriately Normal affect Remainder of physical examination not performed (telehealth visit; coronavirus pandemic)  ASSESSMENT & PLAN:    1. Paroxysmal atrial fibrillation-no recurrences based on history.  Continue present dose of amiodarone.  Continue apixaban.  Check hemoglobin, renal function, TSH, liver functions and chest x-ray. 2. Chronic diastolic congestive heart failure-patient doing well from a symptomatic standpoint.  Continue present dose of diuretic.  We will check potassium and renal function. 3. Hypertension- Continue present medications and follow. 4. Mild AS-will repeat echo in the future.   COVID-19 Education: The importance of social distancing was discussed today.  Time:   Today, I have spent 17 minutes with the patient with telehealth technology discussing the above problems.     Medication Adjustments/Labs and Tests Ordered: Current medicines are reviewed at length with the patient today.  Concerns regarding medicines are outlined above.   Tests Ordered: No orders of the defined types were placed in this encounter.   Medication Changes: No orders of the defined types were placed in this encounter.   Follow Up:  Virtual Visit or In Person in 6 month(s)   Signed, Kirk Ruths, MD  04/04/2019 3:09 PM    [place]

## 2019-04-04 ENCOUNTER — Telehealth (INDEPENDENT_AMBULATORY_CARE_PROVIDER_SITE_OTHER): Payer: Medicare Other | Admitting: Cardiology

## 2019-04-04 ENCOUNTER — Telehealth: Payer: Self-pay | Admitting: Cardiology

## 2019-04-04 VITALS — Ht 63.0 in | Wt 168.0 lb

## 2019-04-04 DIAGNOSIS — I10 Essential (primary) hypertension: Secondary | ICD-10-CM

## 2019-04-04 DIAGNOSIS — I480 Paroxysmal atrial fibrillation: Secondary | ICD-10-CM

## 2019-04-04 DIAGNOSIS — I5032 Chronic diastolic (congestive) heart failure: Secondary | ICD-10-CM | POA: Diagnosis not present

## 2019-04-04 NOTE — Telephone Encounter (Signed)
Virtual Visit Pre-Appointment Phone Call  "(Name), I am calling you today to discuss your upcoming appointment. We are currently trying to limit exposure to the virus that causes COVID-19 by seeing patients at home rather than in the office."   1. Confirm consent - "In the setting of the current Covid19 crisis, you are scheduled for a (phone or video) visit with your provider on (date) at (time).  Just as we do with many in-office visits, in order for you to participate in this visit, we must obtain consent.  If you'd like, I can send this to your mychart (if signed up) or email for you to review.  Otherwise, I can obtain your verbal consent now.  All virtual visits are billed to your insurance company just like a normal visit would be.  By agreeing to a virtual visit, we'd like you to understand that the technology does not allow for your provider to perform an examination, and thus may limit your provider's ability to fully assess your condition. If your provider identifies any concerns that need to be evaluated in person, we will make arrangements to do so.  Finally, though the technology is pretty good, we cannot assure that it will always work on either your or our end, and in the setting of a video visit, we may have to convert it to a phone-only visit.  In either situation, we cannot ensure that we have a secure connection.  Are you willing to proceed?" STAFF: Did the patient verbally acknowledge consent to telehealth visit? Document YES/NO here: yes 2.       FULL LENGTH CONSENT FOR TELE-HEALTH VISIT   I hereby voluntarily request, consent and authorize [facility] and its employed or contracted physicians, physician assistants, nurse practitioners or other licensed health care professionals (the Practitioner), to provide me with telemedicine health care services (the Services") as deemed necessary by the treating Practitioner. I acknowledge and consent to receive the Services by the  Practitioner via telemedicine. I understand that the telemedicine visit will involve communicating with the Practitioner through live audiovisual communication technology and the disclosure of certain medical information by electronic transmission. I acknowledge that I have been given the opportunity to request an in-person assessment or other available alternative prior to the telemedicine visit and am voluntarily participating in the telemedicine visit.  I understand that I have the right to withhold or withdraw my consent to the use of telemedicine in the course of my care at any time, without affecting my right to future care or treatment, and that the Practitioner or I may terminate the telemedicine visit at any time. I understand that I have the right to inspect all information obtained and/or recorded in the course of the telemedicine visit and may receive copies of available information for a reasonable fee.  I understand that some of the potential risks of receiving the Services via telemedicine include:   Delay or interruption in medical evaluation due to technological equipment failure or disruption;  Information transmitted may not be sufficient (e.g. poor resolution of images) to allow for appropriate medical decision making by the Practitioner; and/or   In rare instances, security protocols could fail, causing a breach of personal health information.  Furthermore, I acknowledge that it is my responsibility to provide information about my medical history, conditions and care that is complete and accurate to the best of my ability. I acknowledge that Practitioner's advice, recommendations, and/or decision may be based on factors not within their  control, such as incomplete or inaccurate data provided by me or distortions of diagnostic images or specimens that may result from electronic transmissions. I understand that the practice of medicine is not an exact science and that Practitioner makes  no warranties or guarantees regarding treatment outcomes. I acknowledge that I will receive a copy of this consent concurrently upon execution via email to the email address I last provided but may also request a printed copy by calling the office of [facility].    I understand that my insurance will be billed for this visit.   I have read or had this consent read to me.  I understand the contents of this consent, which adequately explains the benefits and risks of the Services being provided via telemedicine.   I have been provided ample opportunity to ask questions regarding this consent and the Services and have had my questions answered to my satisfaction.  I give my informed consent for the services to be provided through the use of telemedicine in my medical care  By participating in this telemedicine visit I agree to the above.

## 2019-04-04 NOTE — Patient Instructions (Signed)
Medication Instructions:  NO CHANGE If you need a refill on your cardiac medications before your next appointment, please call your pharmacy.   Lab work: Your physician recommends that you return for lab work WHEN CONVENIENT  If you have labs (blood work) drawn today and your tests are completely normal, you will receive your results only by: Marland Kitchen MyChart Message (if you have MyChart) OR . A paper copy in the mail If you have any lab test that is abnormal or we need to change your treatment, we will call you to review the results.  Testing/Procedures: A chest x-ray takes a picture of the organs and structures inside the chest, including the heart, lungs, and blood vessels. This test can show several things, including, whether the heart is enlarges; whether fluid is building up in the lungs; and whether pacemaker / defibrillator leads are still in place. [place]  Follow-Up: At [facility], you and your health needs are our priority.  As part of our continuing mission to provide you with exceptional heart care, we have created designated Provider Care Teams.  These Care Teams include your primary Cardiologist (physician) and Advanced Practice Providers (APPs -  Physician Assistants and Nurse Practitioners) who all work together to provide you with the care you need, when you need it. You will need a follow up appointment in 6 months WITH APP AND 1 [facility]  Please call our office 2 months in advance to schedule this appointment. You will see one of the following Advanced Practice Providers on your designated Care Team:   Kerin Ransom, PA-C Roby Lofts, [place] . Sande Rives, PA-C

## 2019-04-10 ENCOUNTER — Encounter: Payer: Self-pay | Admitting: Podiatry

## 2019-04-10 ENCOUNTER — Ambulatory Visit (INDEPENDENT_AMBULATORY_CARE_PROVIDER_SITE_OTHER): Payer: Medicare Other | Admitting: Podiatry

## 2019-04-10 ENCOUNTER — Other Ambulatory Visit: Payer: Self-pay

## 2019-04-10 DIAGNOSIS — M79675 Pain in left toe(s): Secondary | ICD-10-CM | POA: Diagnosis not present

## 2019-04-10 DIAGNOSIS — D689 Coagulation defect, unspecified: Secondary | ICD-10-CM | POA: Diagnosis not present

## 2019-04-10 DIAGNOSIS — M79674 Pain in right toe(s): Secondary | ICD-10-CM | POA: Diagnosis not present

## 2019-04-10 DIAGNOSIS — B351 Tinea unguium: Secondary | ICD-10-CM

## 2019-04-10 NOTE — Progress Notes (Signed)
Complaint:  Visit Type: Patient returns to my office for continued preventative foot care services. Complaint: Patient states" my nails have grown long and thick and become painful to walk and wear shoes".  Patient presents in a wheelchair with her daughter. . The patient presents for preventative foot care services. No changes to ROS.  Patient has not been seen in over 7 months.  Podiatric Exam: Vascular: dorsalis pedis are palpable bilateral.  Posterior tibial pulses are non palpable. Capillary return is immediate. Temperature gradient is WNL. Skin turgor WNL  Sensorium: Normal Semmes Weinstein monofilament test. Normal tactile sensation bilaterally. Nail Exam: Pt has thick disfigured discolored nails with subungual debris noted bilateral entire nail hallux through fifth toenails Ulcer Exam: There is no evidence of ulcer or pre-ulcerative changes or infection. Orthopedic Exam: Muscle tone and strength are WNL. No limitations in general ROM. No crepitus or effusions noted. Foot type and digits show no abnormalities. Bony prominences are unremarkable. Skin: No Porokeratosis. No infection or ulcers  Diagnosis:  Onychomycosis, , Pain in right toe, pain in left toes  Treatment & Plan Procedures and Treatment: Consent by patient was obtained for treatment procedures.   Debridement of mycotic and hypertrophic toenails, 1 through 5 bilateral and clearing of subungual debris. No ulceration, no infection noted.  Return Visit-Office Procedure: Patient instructed to return to the office for a follow up visit 3 months for continued evaluation and treatment.    Gardiner Barefoot DPM

## 2019-04-29 ENCOUNTER — Other Ambulatory Visit: Payer: Self-pay

## 2019-04-29 ENCOUNTER — Ambulatory Visit
Admission: RE | Admit: 2019-04-29 | Discharge: 2019-04-29 | Disposition: A | Discharge status: Home / Self Care | Payer: Medicare Other | Source: Ambulatory Visit | Attending: Cardiology | Admitting: Cardiology

## 2019-04-29 DIAGNOSIS — I517 Cardiomegaly: Secondary | ICD-10-CM | POA: Diagnosis not present

## 2019-04-29 DIAGNOSIS — I480 Paroxysmal atrial fibrillation: Secondary | ICD-10-CM | POA: Diagnosis not present

## 2019-04-30 ENCOUNTER — Telehealth: Payer: Self-pay | Admitting: Cardiology

## 2019-04-30 DIAGNOSIS — Z79899 Other long term (current) drug therapy: Secondary | ICD-10-CM

## 2019-04-30 LAB — CBC
Hematocrit: 35 % (ref 34.0–46.6)
Hemoglobin: 11.4 g/dL (ref 11.1–15.9)
MCH: 28.1 pg (ref 26.6–33.0)
MCHC: 32.6 g/dL (ref 31.5–35.7)
MCV: 86 fL (ref 79–97)
Platelets: 242 10*3/uL (ref 150–450)
RBC: 4.05 x10E6/uL (ref 3.77–5.28)
RDW: 13.2 % (ref 11.7–15.4)
WBC: 8.7 10*3/uL (ref 3.4–10.8)

## 2019-04-30 LAB — HEPATIC FUNCTION PANEL
ALT: 11 IU/L (ref 0–32)
AST: 15 IU/L (ref 0–40)
Albumin: 3.7 g/dL (ref 3.5–4.6)
Alkaline Phosphatase: 87 IU/L (ref 39–117)
Bilirubin Total: 0.5 mg/dL (ref 0.0–1.2)
Bilirubin, Direct: 0.17 mg/dL (ref 0.00–0.40)
Total Protein: 6.2 g/dL (ref 6.0–8.5)

## 2019-04-30 LAB — BASIC METABOLIC PANEL
BUN/Creatinine Ratio: 20 (ref 12–28)
BUN: 17 mg/dL (ref 10–36)
CO2: 24 mmol/L (ref 20–29)
Calcium: 8.8 mg/dL (ref 8.7–10.3)
Chloride: 95 mmol/L — ABNORMAL LOW (ref 96–106)
Creatinine, Ser: 0.87 mg/dL (ref 0.57–1.00)
GFR calc Af Amer: 67 mL/min/{1.73_m2} (ref >59)
GFR calc non Af Amer: 58 mL/min/{1.73_m2} — ABNORMAL LOW (ref >59)
Glucose: 88 mg/dL (ref 65–99)
Potassium: 5.4 mmol/L — ABNORMAL HIGH (ref 3.5–5.2)
Sodium: 137 mmol/L (ref 134–144)

## 2019-04-30 LAB — TSH: TSH: 4.53 u[IU]/mL — ABNORMAL HIGH (ref 0.450–4.500)

## 2019-04-30 NOTE — Telephone Encounter (Signed)
Patient's daughter returned call.

## 2019-04-30 NOTE — Telephone Encounter (Signed)
Notes recorded by Lelon Perla, MD on 04/30/2019 at 7:41 AM EDT  DC Rebecca Norman; Rebecca Norman one week  [place]

## 2019-04-30 NOTE — Telephone Encounter (Signed)
Daughter updated with MD recommendations and voiced understanding. Med list updated and BMP order placed.

## 2019-04-30 NOTE — Telephone Encounter (Signed)
  Daughter is returning call for patients lab results

## 2019-05-06 ENCOUNTER — Inpatient Hospital Stay (HOSPITAL_COMMUNITY): Payer: Medicare Other

## 2019-05-06 ENCOUNTER — Emergency Department (HOSPITAL_COMMUNITY): Payer: Medicare Other

## 2019-05-06 ENCOUNTER — Other Ambulatory Visit: Payer: Self-pay

## 2019-05-06 ENCOUNTER — Inpatient Hospital Stay (HOSPITAL_COMMUNITY)
Admission: EM | Admit: 2019-05-06 | Discharge: 2019-05-11 | DRG: 064 | Disposition: A | Discharge status: Skilled Nursing Facility | Payer: Medicare Other | Attending: Internal Medicine | Admitting: Internal Medicine

## 2019-05-06 ENCOUNTER — Encounter (HOSPITAL_COMMUNITY): Payer: Self-pay | Admitting: Physician Assistant

## 2019-05-06 DIAGNOSIS — F329 Major depressive disorder, single episode, unspecified: Secondary | ICD-10-CM | POA: Diagnosis present

## 2019-05-06 DIAGNOSIS — Z8673 Personal history of transient ischemic attack (TIA), and cerebral infarction without residual deficits: Secondary | ICD-10-CM | POA: Diagnosis present

## 2019-05-06 DIAGNOSIS — R001 Bradycardia, unspecified: Secondary | ICD-10-CM | POA: Diagnosis not present

## 2019-05-06 DIAGNOSIS — E039 Hypothyroidism, unspecified: Secondary | ICD-10-CM | POA: Diagnosis present

## 2019-05-06 DIAGNOSIS — R296 Repeated falls: Secondary | ICD-10-CM | POA: Diagnosis present

## 2019-05-06 DIAGNOSIS — Z7901 Long term (current) use of anticoagulants: Secondary | ICD-10-CM

## 2019-05-06 DIAGNOSIS — I5033 Acute on chronic diastolic (congestive) heart failure: Secondary | ICD-10-CM | POA: Diagnosis not present

## 2019-05-06 DIAGNOSIS — Z9842 Cataract extraction status, left eye: Secondary | ICD-10-CM

## 2019-05-06 DIAGNOSIS — E875 Hyperkalemia: Secondary | ICD-10-CM | POA: Diagnosis not present

## 2019-05-06 DIAGNOSIS — R0602 Shortness of breath: Secondary | ICD-10-CM | POA: Diagnosis not present

## 2019-05-06 DIAGNOSIS — I959 Hypotension, unspecified: Secondary | ICD-10-CM | POA: Diagnosis not present

## 2019-05-06 DIAGNOSIS — R54 Age-related physical debility: Secondary | ICD-10-CM | POA: Diagnosis present

## 2019-05-06 DIAGNOSIS — I471 Supraventricular tachycardia: Secondary | ICD-10-CM | POA: Diagnosis present

## 2019-05-06 DIAGNOSIS — Z9181 History of falling: Secondary | ICD-10-CM | POA: Diagnosis not present

## 2019-05-06 DIAGNOSIS — M8580 Other specified disorders of bone density and structure, unspecified site: Secondary | ICD-10-CM | POA: Diagnosis present

## 2019-05-06 DIAGNOSIS — W19XXXA Unspecified fall, initial encounter: Secondary | ICD-10-CM | POA: Diagnosis present

## 2019-05-06 DIAGNOSIS — E559 Vitamin D deficiency, unspecified: Secondary | ICD-10-CM | POA: Diagnosis not present

## 2019-05-06 DIAGNOSIS — M5030 Other cervical disc degeneration, unspecified cervical region: Secondary | ICD-10-CM | POA: Diagnosis present

## 2019-05-06 DIAGNOSIS — I350 Nonrheumatic aortic (valve) stenosis: Secondary | ICD-10-CM | POA: Diagnosis not present

## 2019-05-06 DIAGNOSIS — G960 Cerebrospinal fluid leak: Secondary | ICD-10-CM | POA: Diagnosis present

## 2019-05-06 DIAGNOSIS — I340 Nonrheumatic mitral (valve) insufficiency: Secondary | ICD-10-CM | POA: Diagnosis not present

## 2019-05-06 DIAGNOSIS — Z66 Do not resuscitate: Secondary | ICD-10-CM | POA: Diagnosis present

## 2019-05-06 DIAGNOSIS — K5900 Constipation, unspecified: Secondary | ICD-10-CM | POA: Diagnosis not present

## 2019-05-06 DIAGNOSIS — I361 Nonrheumatic tricuspid (valve) insufficiency: Secondary | ICD-10-CM | POA: Diagnosis not present

## 2019-05-06 DIAGNOSIS — G4700 Insomnia, unspecified: Secondary | ICD-10-CM | POA: Diagnosis not present

## 2019-05-06 DIAGNOSIS — Z9841 Cataract extraction status, right eye: Secondary | ICD-10-CM

## 2019-05-06 DIAGNOSIS — J9611 Chronic respiratory failure with hypoxia: Secondary | ICD-10-CM | POA: Diagnosis present

## 2019-05-06 DIAGNOSIS — R42 Dizziness and giddiness: Secondary | ICD-10-CM

## 2019-05-06 DIAGNOSIS — Z961 Presence of intraocular lens: Secondary | ICD-10-CM | POA: Diagnosis present

## 2019-05-06 DIAGNOSIS — K649 Unspecified hemorrhoids: Secondary | ICD-10-CM | POA: Diagnosis present

## 2019-05-06 DIAGNOSIS — D181 Lymphangioma, any site: Secondary | ICD-10-CM | POA: Diagnosis not present

## 2019-05-06 DIAGNOSIS — Z888 Allergy status to other drugs, medicaments and biological substances status: Secondary | ICD-10-CM

## 2019-05-06 DIAGNOSIS — I6340 Cerebral infarction due to embolism of unspecified cerebral artery: Principal | ICD-10-CM | POA: Diagnosis present

## 2019-05-06 DIAGNOSIS — I441 Atrioventricular block, second degree: Secondary | ICD-10-CM | POA: Diagnosis not present

## 2019-05-06 DIAGNOSIS — I10 Essential (primary) hypertension: Secondary | ICD-10-CM | POA: Diagnosis not present

## 2019-05-06 DIAGNOSIS — Z9981 Dependence on supplemental oxygen: Secondary | ICD-10-CM

## 2019-05-06 DIAGNOSIS — Z8542 Personal history of malignant neoplasm of other parts of uterus: Secondary | ICD-10-CM

## 2019-05-06 DIAGNOSIS — I358 Other nonrheumatic aortic valve disorders: Secondary | ICD-10-CM

## 2019-05-06 DIAGNOSIS — I639 Cerebral infarction, unspecified: Secondary | ICD-10-CM | POA: Diagnosis not present

## 2019-05-06 DIAGNOSIS — S199XXA Unspecified injury of neck, initial encounter: Secondary | ICD-10-CM | POA: Diagnosis not present

## 2019-05-06 DIAGNOSIS — Z79890 Hormone replacement therapy (postmenopausal): Secondary | ICD-10-CM

## 2019-05-06 DIAGNOSIS — I4891 Unspecified atrial fibrillation: Secondary | ICD-10-CM | POA: Diagnosis not present

## 2019-05-06 DIAGNOSIS — Z20828 Contact with and (suspected) exposure to other viral communicable diseases: Secondary | ICD-10-CM | POA: Diagnosis present

## 2019-05-06 DIAGNOSIS — S0990XA Unspecified injury of head, initial encounter: Secondary | ICD-10-CM | POA: Diagnosis not present

## 2019-05-06 DIAGNOSIS — D649 Anemia, unspecified: Secondary | ICD-10-CM | POA: Diagnosis not present

## 2019-05-06 DIAGNOSIS — I4819 Other persistent atrial fibrillation: Secondary | ICD-10-CM

## 2019-05-06 DIAGNOSIS — E876 Hypokalemia: Secondary | ICD-10-CM | POA: Diagnosis present

## 2019-05-06 DIAGNOSIS — E034 Atrophy of thyroid (acquired): Secondary | ICD-10-CM | POA: Diagnosis not present

## 2019-05-06 DIAGNOSIS — I491 Atrial premature depolarization: Secondary | ICD-10-CM

## 2019-05-06 DIAGNOSIS — M6250 Muscle wasting and atrophy, not elsewhere classified, unspecified site: Secondary | ICD-10-CM | POA: Diagnosis present

## 2019-05-06 DIAGNOSIS — I110 Hypertensive heart disease with heart failure: Secondary | ICD-10-CM | POA: Diagnosis present

## 2019-05-06 DIAGNOSIS — R2689 Other abnormalities of gait and mobility: Secondary | ICD-10-CM | POA: Diagnosis not present

## 2019-05-06 DIAGNOSIS — Z6829 Body mass index (BMI) 29.0-29.9, adult: Secondary | ICD-10-CM

## 2019-05-06 DIAGNOSIS — E669 Obesity, unspecified: Secondary | ICD-10-CM | POA: Diagnosis present

## 2019-05-06 DIAGNOSIS — I440 Atrioventricular block, first degree: Secondary | ICD-10-CM | POA: Diagnosis not present

## 2019-05-06 DIAGNOSIS — Z111 Encounter for screening for respiratory tuberculosis: Secondary | ICD-10-CM | POA: Diagnosis not present

## 2019-05-06 DIAGNOSIS — M2550 Pain in unspecified joint: Secondary | ICD-10-CM | POA: Diagnosis not present

## 2019-05-06 DIAGNOSIS — Z800 Family history of malignant neoplasm of digestive organs: Secondary | ICD-10-CM

## 2019-05-06 DIAGNOSIS — I080 Rheumatic disorders of both mitral and aortic valves: Secondary | ICD-10-CM | POA: Diagnosis present

## 2019-05-06 DIAGNOSIS — Z8551 Personal history of malignant neoplasm of bladder: Secondary | ICD-10-CM

## 2019-05-06 DIAGNOSIS — Z86010 Personal history of colonic polyps: Secondary | ICD-10-CM

## 2019-05-06 DIAGNOSIS — I951 Orthostatic hypotension: Secondary | ICD-10-CM | POA: Diagnosis not present

## 2019-05-06 DIAGNOSIS — R531 Weakness: Secondary | ICD-10-CM | POA: Diagnosis not present

## 2019-05-06 DIAGNOSIS — R52 Pain, unspecified: Secondary | ICD-10-CM | POA: Diagnosis not present

## 2019-05-06 DIAGNOSIS — Z7401 Bed confinement status: Secondary | ICD-10-CM | POA: Diagnosis not present

## 2019-05-06 DIAGNOSIS — I651 Occlusion and stenosis of basilar artery: Secondary | ICD-10-CM | POA: Diagnosis present

## 2019-05-06 DIAGNOSIS — Z803 Family history of malignant neoplasm of breast: Secondary | ICD-10-CM

## 2019-05-06 DIAGNOSIS — Z79899 Other long term (current) drug therapy: Secondary | ICD-10-CM

## 2019-05-06 DIAGNOSIS — Z87891 Personal history of nicotine dependence: Secondary | ICD-10-CM

## 2019-05-06 DIAGNOSIS — W19XXXD Unspecified fall, subsequent encounter: Secondary | ICD-10-CM | POA: Diagnosis not present

## 2019-05-06 DIAGNOSIS — I480 Paroxysmal atrial fibrillation: Secondary | ICD-10-CM | POA: Diagnosis present

## 2019-05-06 DIAGNOSIS — G9608 Other cranial cerebrospinal fluid leak: Secondary | ICD-10-CM

## 2019-05-06 DIAGNOSIS — R197 Diarrhea, unspecified: Secondary | ICD-10-CM | POA: Diagnosis not present

## 2019-05-06 DIAGNOSIS — M6281 Muscle weakness (generalized): Secondary | ICD-10-CM | POA: Diagnosis not present

## 2019-05-06 HISTORY — DX: Bradycardia, unspecified: R00.1

## 2019-05-06 LAB — CBC WITH DIFFERENTIAL/PLATELET
Abs Immature Granulocytes: 0.05 10*3/uL (ref 0.00–0.07)
Basophils Absolute: 0.1 10*3/uL (ref 0.0–0.1)
Basophils Relative: 1 %
Eosinophils Absolute: 0.1 10*3/uL (ref 0.0–0.5)
Eosinophils Relative: 1 %
HCT: 34.6 % — ABNORMAL LOW (ref 36.0–46.0)
Hemoglobin: 10.7 g/dL — ABNORMAL LOW (ref 12.0–15.0)
Immature Granulocytes: 1 %
Lymphocytes Relative: 7 %
Lymphs Abs: 0.6 10*3/uL — ABNORMAL LOW (ref 0.7–4.0)
MCH: 28.8 pg (ref 26.0–34.0)
MCHC: 30.9 g/dL (ref 30.0–36.0)
MCV: 93 fL (ref 80.0–100.0)
Monocytes Absolute: 0.7 10*3/uL (ref 0.1–1.0)
Monocytes Relative: 9 %
Neutro Abs: 6.7 10*3/uL (ref 1.7–7.7)
Neutrophils Relative %: 81 %
Platelets: 279 10*3/uL (ref 150–400)
RBC: 3.72 MIL/uL — ABNORMAL LOW (ref 3.87–5.11)
RDW: 13.7 % (ref 11.5–15.5)
WBC: 8.1 10*3/uL (ref 4.0–10.5)
nRBC: 0 % (ref 0.0–0.2)

## 2019-05-06 LAB — CREATININE, SERUM
Creatinine, Ser: 0.95 mg/dL (ref 0.44–1.00)
Creatinine, Ser: 0.83 mg/dL (ref 0.44–1.00)
GFR calc Af Amer: >60 mL/min (ref >60)
GFR calc Af Amer: >60 mL/min (ref >60)
GFR calc non Af Amer: 52 mL/min — ABNORMAL LOW (ref >60)
GFR calc non Af Amer: >60 mL/min (ref >60)

## 2019-05-06 LAB — BASIC METABOLIC PANEL
Anion gap: 10 (ref 5–15)
BUN: 16 mg/dL (ref 8–23)
CO2: 26 mmol/L (ref 22–32)
Calcium: 8.4 mg/dL — ABNORMAL LOW (ref 8.9–10.3)
Chloride: 100 mmol/L (ref 98–111)
Creatinine, Ser: 0.93 mg/dL (ref 0.44–1.00)
GFR calc Af Amer: >60 mL/min (ref >60)
GFR calc non Af Amer: 54 mL/min — ABNORMAL LOW (ref >60)
Glucose, Bld: 98 mg/dL (ref 70–99)
Potassium: 4.2 mmol/L (ref 3.5–5.1)
Sodium: 136 mmol/L (ref 135–145)

## 2019-05-06 LAB — BRAIN NATRIURETIC PEPTIDE: B Natriuretic Peptide: 1032.4 pg/mL — ABNORMAL HIGH (ref 0.0–100.0)

## 2019-05-06 LAB — MAGNESIUM: Magnesium: 1.9 mg/dL (ref 1.7–2.4)

## 2019-05-06 LAB — TROPONIN I (HIGH SENSITIVITY)
Troponin I (High Sensitivity): 85 ng/L — ABNORMAL HIGH (ref <18)
Troponin I (High Sensitivity): 106 ng/L (ref <18)
Troponin I (High Sensitivity): 105 ng/L (ref <18)

## 2019-05-06 LAB — SARS CORONAVIRUS 2 (TAT 6-24 HRS): SARS Coronavirus 2: NEGATIVE

## 2019-05-06 MED ORDER — ENOXAPARIN SODIUM 40 MG/0.4ML ~~LOC~~ SOLN: 40.0000 mg | SUBCUTANEOUS | Status: DC

## 2019-05-06 MED ORDER — HEPARIN (PORCINE) 25000 UT/250ML-% IV SOLN
1250.0000 [IU]/h | INTRAVENOUS | Status: DC
  Administered 2019-05-06: 17:00:00 1000 [IU]/h via INTRAVENOUS
  Administered 2019-05-07: 17:00:00 1150 [IU]/h via INTRAVENOUS
  Filled 2019-05-06 (×3): qty 250

## 2019-05-06 MED ORDER — NITROGLYCERIN 0.4 MG SL SUBL: 0.4000 mg | SUBLINGUAL_TABLET | SUBLINGUAL | Status: DC | PRN

## 2019-05-06 MED ORDER — SODIUM CHLORIDE 0.9% FLUSH
3.0000 mL | Freq: Two times a day (BID) | INTRAVENOUS | Status: DC
  Administered 2019-05-07 – 2019-05-11 (×8): 3 mL via INTRAVENOUS

## 2019-05-06 MED ORDER — ONDANSETRON HCL 4 MG/2ML IJ SOLN: 4.0000 mg | Freq: Four times a day (QID) | INTRAMUSCULAR | Status: DC | PRN

## 2019-05-06 MED ORDER — ZOLPIDEM TARTRATE 5 MG PO TABS: 5.0000 mg | ORAL_TABLET | Freq: Every evening | ORAL | Status: DC | PRN

## 2019-05-06 MED ORDER — LEVOTHYROXINE SODIUM 50 MCG PO TABS: 50.0000 ug | ORAL_TABLET | ORAL | Status: DC

## 2019-05-06 MED ORDER — VITAMIN B-12 1000 MCG PO TABS
1000.0000 ug | ORAL_TABLET | Freq: Every day | ORAL | Status: DC
  Administered 2019-05-07 – 2019-05-11 (×5): 1000 ug via ORAL
  Filled 2019-05-06 (×5): qty 1

## 2019-05-06 MED ORDER — LEVOTHYROXINE SODIUM 50 MCG PO TABS
50.0000 ug | ORAL_TABLET | ORAL | Status: DC
  Administered 2019-05-08 – 2019-05-10 (×2): 50 ug via ORAL
  Filled 2019-05-06 (×3): qty 1

## 2019-05-06 MED ORDER — FUROSEMIDE 10 MG/ML IJ SOLN
40.0000 mg | Freq: Two times a day (BID) | INTRAMUSCULAR | Status: DC
  Administered 2019-05-06 – 2019-05-08 (×4): 40 mg via INTRAVENOUS
  Filled 2019-05-06 (×3): qty 4

## 2019-05-06 MED ORDER — SERTRALINE HCL 50 MG PO TABS
50.0000 mg | ORAL_TABLET | Freq: Every day | ORAL | Status: DC
  Administered 2019-05-06 – 2019-05-10 (×5): 50 mg via ORAL
  Filled 2019-05-06 (×5): qty 1

## 2019-05-06 MED ORDER — FUROSEMIDE 20 MG PO TABS: 40.0000 mg | ORAL_TABLET | Freq: Every day | ORAL | Status: DC

## 2019-05-06 MED ORDER — SODIUM CHLORIDE 0.9% FLUSH: 3.0000 mL | INTRAVENOUS | Status: DC | PRN

## 2019-05-06 MED ORDER — ACETAMINOPHEN 325 MG PO TABS
650.0000 mg | ORAL_TABLET | ORAL | Status: DC | PRN
  Administered 2019-05-06 – 2019-05-10 (×6): 650 mg via ORAL
  Filled 2019-05-06 (×6): qty 2

## 2019-05-06 MED ORDER — LEVOTHYROXINE SODIUM 75 MCG PO TABS
75.0000 ug | ORAL_TABLET | ORAL | Status: DC
  Administered 2019-05-07 – 2019-05-11 (×3): 75 ug via ORAL
  Filled 2019-05-06 (×3): qty 1

## 2019-05-06 MED ORDER — ALPRAZOLAM 0.25 MG PO TABS
0.2500 mg | ORAL_TABLET | Freq: Two times a day (BID) | ORAL | Status: DC | PRN
  Administered 2019-05-06 – 2019-05-10 (×4): 0.25 mg via ORAL
  Filled 2019-05-06 (×4): qty 1

## 2019-05-06 MED ORDER — SODIUM CHLORIDE 0.9 % IV SOLN: 250.0000 mL | INTRAVENOUS | Status: DC | PRN

## 2019-05-06 NOTE — Consult Note (Signed)
ELECTROPHYSIOLOGY CONSULT NOTE  Patient ID: Rebecca Norman, MRN: [id number], DOB/AGE: October 15, 1924 85 y.o. Admit date: 05/06/2019 Date of Consult: 05/06/2019  Primary Physician: [name], MD Primary Cardiologist: [facility] Rebecca Norman is a 85 y.o. female who is being seen today for the evaluation of bradycardia  at the request of Dr Lynnda Shields.   Chief Complaint: bradycardia and falls   HPI Rebecca Norman is a 85 y.o. female admitted today because she awakened, felt nauseated and weak and dizzy; HR monitoring >>40's and came to the ER.  Hx of mild AS and atrial fib Rx with amio and apixoban  Initially diagnosed 2018; presenting again with RVR (120s) and then CHF underwent DCCV with post DCCV atrial tach >> amiodarone  Admitted 3/20 w weakness found to be bradycardic ( HR 50s) and BB stopped     She has hx of recurrent falls over the last few years; most recent episode was Friday 8/28--she had weakness in her legs shortly after standing up and was unable to sit down and fell on the floor  She has had about two weeks of weakness and her HR per daily measurements have been in the 50s-- The family does not yet have, but will be getting from her PAD measurements before these last two weeks as last recorded HR 7/10 was 70s  As noted her HR has mostly been quite slow-- the first line on the graph>> 50 bpm    Notably this am when she felt weak her HR was 40.s but BP was > 150  Some orthostatic LH     DATE TEST EF   3/19 Echo   65-70 % AS mild  Mean Grad 11        Date Cr K Hgb TSH LFTs  8/20 0.93 4.2<<5.4 11.4 4.5 15     Thromboembolic risk factors ( age  -2, HTN-1, TIA/CVA-?  Vasc disease ?  Gender-1) for a CHADSVASc Score of >=4   O2 dependent lung disease  CXR last week B basilar scarring  Past Medical History:  Diagnosis Date  . DDD (degenerative disc disease), cervical    severe/  auto fusion c2-c5  . Diverticulosis   . Hemorrhoids   . History of adenomatous polyp of  colon    1995  adenomatous polyp/  1997 & 2003  hyperplastic polyp's  . History of bladder cancer    s/p  TURBT's, urologist-  Dr [name]  . History of cancer of ureter   . Hypertension   . Hypothyroid   . Nocturia   . Osteoarthritis   . Osteopenia   . Symptomatic bradycardia 05/06/2019      Surgical History:  Past Surgical History:  Procedure Laterality Date  . BACK SURGERY    . CARDIOVERSION N/A 08/09/2017   Procedure: CARDIOVERSION;  Surgeon: Larey Dresser, MD;  Location[facility] ENDOSCOPY;  Service: Cardiovascular;  Laterality: N/A;  . CATARACT EXTRACTION W/ INTRAOCULAR LENS  IMPLANT, BILATERAL Bilateral   . CYSTOSCOPY W/ RETROGRADES  10/03/2011   Procedure: CYSTOSCOPY WITH RETROGRADE PYELOGRAM;  Surgeon: Molli Hazard, MD;  Location[facility];  Service: Urology;;  . Consuela Mimes W/ RETROGRADES Bilateral 06/17/2016   Procedure: CYSTOSCOPY WITH RETROGRADE PYELOGRAM;  Surgeon: Alexis Frock, MD;  [facility];  Service: Urology;  Laterality: Bilateral;  . CYSTOSCOPY WITH BIOPSY N/A 06/17/2016   Procedure: CYSTOSCOPY WITH BIOPSY AND FULGERATION;  Surgeon: Alexis Frock, MD;  Location: [facility];  Service: Urology;  Laterality: N/A;  . LAMINECTOMY  2009   L3 - 4  . ORIF ANKLE FRACTURE Left 08/28/2017   Procedure: OPEN REDUCTION INTERNAL FIXATION (ORIF) ANKLE FRACTURE;  Surgeon: Shona Needles, MD;  Location: [facility] ORS;  Service: Orthopedics;  Laterality: Left;  . TRANSURETHRAL RESECTION OF BLADDER  x3 --  2009; 2010; 06-29-2010  . TRANSURETHRAL RESECTION OF BLADDER TUMOR  10/03/2011   Procedure: TRANSURETHRAL RESECTION OF BLADDER TUMOR (TURBT);  Surgeon: Molli Hazard, MD;  Location: [facility];  Service: Urology;  Laterality: N/A;     Home Meds: Prior to Admission medications   Medication Sig Start Date End Date Taking? Authorizing Provider  acetaminophen (TYLENOL) 500 MG tablet Take 500 mg by  mouth every 6 (six) hours as needed for headache (pain).    Yes [provider]  amiodarone (PACERONE) 200 MG tablet Take 1 tablet (200 mg total) by mouth daily. 07/03/18  Yes Crenshaw, [name], MD  ELIQUIS 5 MG TABS tablet TAKE 1 TABLET TWICE A DAY Patient taking differently: Take 5 mg by mouth 2 (two) times daily.  11/06/18  Yes Lelon Perla, MD  furosemide (LASIX) 20 MG tablet Take 40 mg (2 tabs) in a.m. And take 20mg  (1 tab) in the PM-PRN Patient taking differently: Take 40 mg by mouth every morning.  04/16/18  Yes Lendon Colonel, NP  levothyroxine (SYNTHROID, LEVOTHROID) 50 MCG tablet Take 50-75 mcg by mouth See admin instructions. Take every other alternating between 28mcg and 62mcg.   Yes [provider]  OXYGEN Inhale 4.5 L into the lungs continuous.    Yes [provider]  sertraline (ZOLOFT) 50 MG tablet Take 50 mg by mouth at bedtime.  09/22/18  Yes [provider]  vitamin B-12 (CYANOCOBALAMIN) 1000 MCG tablet Take 1 tablet (1,000 mcg total) by mouth daily. 02/10/18  Yes Rai, Vernelle Emerald, MD    Inpatient Medications:  . [START ON 05/07/2019] enoxaparin (LOVENOX) injection  40 mg Subcutaneous Q24H  . furosemide  40 mg Intravenous BID  . levothyroxine  50 mcg Oral QODAY  . [START ON 05/07/2019] levothyroxine  75 mcg Oral QODAY  . sertraline  50 mg Oral QHS  . sodium chloride flush  3 mL Intravenous Q12H  . vitamin B-12  1,000 mcg Oral Daily     Allergies:  Allergies  Allergen Reactions  . Flagyl [Metronidazole] Hives  . Lisinopril Cough    Social History   Socioeconomic History  . Marital status: Widowed    Spouse name: Not on file  . Number of children: Not on file  . Years of education: Not on file  . Highest education level: Not on file  Occupational History  . Occupation: Retired  Scientific laboratory technician  . Financial resource strain: Not on file  . Food insecurity    Worry: Not on file    Inability: Not on file  . Transportation needs     Medical: Not on file    Non-medical: Not on file  Tobacco Use  . Smoking status: Former Smoker    Years: 10.00    Types: Cigarettes    Quit date: 09/25/2005    Years since quitting: 13.6  . Smokeless tobacco: Never Used  Substance and Sexual Activity  . Alcohol use: Yes  . Drug use: No  . Sexual activity: Not on file  Lifestyle  . Physical activity    Days per week: Not on file    Minutes per session: Not on file  .  Stress: Not on file  Relationships  . Social Herbalist on phone: Not on file    Gets together: Not on file    Attends religious service: Not on file    Active member of club or organization: Not on file    Attends meetings of clubs or organizations: Not on file    Relationship status: Not on file  . Intimate partner violence    Fear of current or ex partner: Not on file    Emotionally abused: Not on file    Physically abused: Not on file    Forced sexual activity: Not on file  Other Topics Concern  . Not on file  Social History Narrative   She lives alone in [place].  Daughter helps in her care.     Family History  Problem Relation Age of Onset  . Breast cancer Sister   . Stomach cancer Sister   . Hernia Sister 46       HERNIA SURGERY COMPLICATIONS  . Colon cancer Neg Hx      ROS:  Please see the history of present illness.     All other systems reviewed and negative.    Physical Exam: Blood pressure (!) 108/49, pulse (!) 108, temperature 98.1 F (36.7 C), temperature source Oral, resp. rate (!) 27, weight 77.1 kg, SpO2 96 %. General: Well developed, well nourished female in no acute distress.Wearing O2 Head: Normocephalic, atraumatic, sclera non-icteric, no xanthomas, nares are without discharge. EENT: normal Lymph Nodes:  none Back: without scoliosis/kyphosis  no CVA tendersness Neck: Negative for carotid bruits. JVD not elevated. Lungs: egophonic changes on the R and diffuse crackles on the L and R Heart: irregular rate and  rhythm that is slow  2/6 murmur , rubs, or gallops appreciated. Abdomen: Soft, non-tender, non-distended with normoactive bowel sounds. No hepatomegaly. No rebound/guarding. No obvious abdominal masses. Msk:  Strength and tone appear normal for age. Extremities: No clubbing or cyanosis. No  edema.  Distal pedal pulses are 2+ and equal bilaterally. Skin: Warm and Dry Neuro: Alert and oriented X 3. CN III-XII intact Grossly normal sensory and motor function . Psych:  Responds to questions appropriately with a normal affect.      Labs: Cardiac Enzymes No results for input(s): CKTOTAL, CKMB, TROPONINI in the last 72 hours. CBC Lab Results  Component Value Date   WBC 8.1 05/06/2019   HGB 10.7 (L) 05/06/2019   HCT 34.6 (L) 05/06/2019   MCV 93.0 05/06/2019   PLT 279 05/06/2019   PROTIME: No results for input(s): LABPROT, INR in the last 72 hours. Chemistry  Recent Labs  Lab 05/06/19 1053  NA 136  K 4.2  CL 100  CO2 26  BUN 16  CREATININE 0.93  CALCIUM 8.4*  GLUCOSE 98   Lipids No results found for: CHOL, HDL, LDLCALC, TRIG BNP No results found for: PROBNP Thyroid Function Tests: 8/24 TSH 4.53    Miscellaneous No results found for: DDIMER  Radiology/Studies:  Dg Chest 2 View  Result Date: 04/29/2019 CLINICAL DATA:  Amiodarone surveillance EXAM: CHEST - 2 VIEW COMPARISON:  09/21/2018 FINDINGS: Cardiomegaly. Scarring in the lung bases. No acute confluent airspace opacities. No effusions. No acute bony abnormality. Aortic atherosclerosis. IMPRESSION: Cardiomegaly, bibasilar scarring.  No change since prior study. Electronically Signed   By: Rolm Baptise M.D.   On: 04/29/2019 18:18   Ct Head Wo Contrast  Result Date: 05/06/2019 CLINICAL DATA:  Fall.  On anticoagulation. EXAM: CT HEAD  WITHOUT CONTRAST TECHNIQUE: Contiguous axial images were obtained from the base of the skull through the vertex without intravenous contrast. COMPARISON:  None. FINDINGS: Brain: Extra-axial CSF  fluid collection on the left measures 8 mm in thickness. This could be acute or chronic hygroma. No high density blood products are present. Mild midline shift to the right approximately 3 mm. Generalized atrophy. No acute infarct or hemorrhage. 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 Vascular: Negative for hyperdense vessel Skull: Negative Sinuses/Orbits: Mild mucosal edema paranasal sinuses. Bilateral cataract surgery. Other: None IMPRESSION: 8 mm thick subdural hygroma on the left with mild midline shift to the right. This could be due to acute or chronic injury. No high density blood products are present. Electronically Signed   By: Franchot Gallo M.D.   On: 05/06/2019 11:36   Dg Chest Portable 1 View  Result Date: 05/06/2019 CLINICAL DATA:  Shortness of breath EXAM: PORTABLE CHEST 1 VIEW COMPARISON:  04/29/2019 FINDINGS: Mild cardiomegaly. Aortic calcified and tortuous. Mild pulmonary vascular congestion with thickened interstitial markings. Probable small bilateral pleural effusions. No pneumothorax. IMPRESSION: Findings suggestive of CHF with interstitial edema and small bilateral pleural effusions. Electronically Signed   By: Davina Poke M.D.   On: 05/06/2019 11:16    EKG:  Personally reviewed  8/31 10:21  Sinus @ 55 with interpolated PACs-conducted with suggestion of MBZ physiology 8/31 10:54  Sinus @ 55 with blocked PACs giving rise to a functional bradycardia  Telemetry Personally reviewed sinus @ 50-55 or so with freq PACs-- many blocked and short runs of non conducted atrial tach  I spent a long time looking at the telemetry and finally convinced myself that the atrial tach is real and not just artifact, even though some of the AA intervals are < 200 msec  Assessment and Plan:  Atrial fibrillation -persistent  Atrial tach  Amiodarone  Sinus bradycardia  Falls recurrent  Chronic O2 dependent lung disease  Dizziness    Her falls have occurred  sporadically over the last few years;  Her HR has been in the 50's much of the visits over the last few years  This lack of association makes it hard to implicate the HR of [id number] with  The falls  However, she has had complaints over the last few years ( see [facility] note 3/19) of weakness, and perhaps bradycardia is contributing to this-- but I am not at all sure  Her episode this am with the HR in the 40s is also unlikely to be responsible for her other symptoms of dizziness and weakness as her coincidental BP >150   This prompts to wonder whether the dizziness  Could be neurological event.  CT was neg ( thankfully) but an MRI and neuro consult might be helpful  Her amio may be contributing to her lung issues, orthostatic intolerance and bradycardia, so at least for the short term, would hold it  Her atrial fib may recur-- we have certainly seen short runs of atrial tach (fast) assoc with antegrade block, likely an effect of obsolescence of her conducting system and amiodarone  Her biggest health risk I think is falls--will measure orthostatic but may benefit from OT and/or PT to assess and recommend  Will follow from [place]

## 2019-05-06 NOTE — ED Notes (Signed)
ED TO INPATIENT HANDOFF REPORT  ED Nurse Name and Phone #: Celene Squibb RN  S Name/Age/Gender Rebecca Norman 85 y.o. female Room/Bed: 031C/031C  Code Status   Code Status: DNR  Home/SNF/Other Home Patient oriented to: self, place, time and situation Is this baseline? Yes   Triage Complete: Triage complete  Chief Complaint brady  Triage Note Pt up this AM with c/o dizziness with movement with one episode of emesis.  Hx of A. Fib which family states has been irregular.  Has had low HR en route.  Stopped Potassium and other unknown meds recently.  Daughter is en route and has more information.  Denies any CP, alert and oriented.  Pt in NAD skin P/W/D.  Initially hypertensive with EMS.   Denies any sx at rest.  Pt does have healing bruising to L orbit.  States she was walking 3 days ago with walker and legs went out striking her L side head.  Denies any LOC, visual changes, HA or neck tenderness.     Allergies Allergies  Allergen Reactions  . Flagyl [Metronidazole] Hives  . Lisinopril Cough    Level of Care/Admitting Diagnosis ED Disposition    ED Disposition Condition [facility] Area: [place] [[id number]]  Level of Care: Telemetry Cardiac [103]  Covid Evaluation: Asymptomatic Screening Protocol (No Symptoms)  Diagnosis: Symptomatic bradycardia [id number]  Admitting Physician: Dorothy Spark [id number]  Attending Physician: Dorothy Spark [id number]  Estimated length of stay: past midnight tomorrow  Certification:: I certify this patient will need inpatient services for at least 2 midnights  PT Class (Do Not Modify): Inpatient [101]  PT Acc Code (Do Not Modify): Private [1]       B Medical/Surgery History Past Medical History:  Diagnosis Date  . DDD (degenerative disc disease), cervical    severe/  auto fusion c2-c5  . Diverticulosis   . Hemorrhoids   . History of adenomatous polyp of colon    1995  adenomatous polyp/   1997 & 2003  hyperplastic polyp's  . History of bladder cancer    s/p  TURBT's, urologist-  Dr [name]  . History of cancer of ureter   . Hypertension   . Hypothyroid   . Nocturia   . Osteoarthritis   . Osteopenia   . Symptomatic bradycardia 05/06/2019   Past Surgical History:  Procedure Laterality Date  . BACK SURGERY    . CARDIOVERSION N/A 08/09/2017   Procedure: CARDIOVERSION;  Surgeon: Larey Dresser, MD;  Location: [facility] ENDOSCOPY;  Service: Cardiovascular;  Laterality: N/A;  . CATARACT EXTRACTION W/ INTRAOCULAR LENS  IMPLANT, BILATERAL Bilateral   . CYSTOSCOPY W/ RETROGRADES  10/03/2011   Procedure: CYSTOSCOPY WITH RETROGRADE PYELOGRAM;  Surgeon: Molli Hazard, MD;  Location: [facility];  Service: Urology;;  . Consuela Mimes W/ RETROGRADES Bilateral 06/17/2016   Procedure: CYSTOSCOPY WITH RETROGRADE PYELOGRAM;  Surgeon: Alexis Frock, MD;  Location: [facility];  Service: Urology;  Laterality: Bilateral;  . CYSTOSCOPY WITH BIOPSY N/A 06/17/2016   Procedure: CYSTOSCOPY WITH BIOPSY AND FULGERATION;  Surgeon: Alexis Frock, MD;  Location: [facility];  Service: Urology;  Laterality: N/A;  . LAMINECTOMY  2009   L3 - 4  . ORIF ANKLE FRACTURE Left 08/28/2017   Procedure: OPEN REDUCTION INTERNAL FIXATION (ORIF) ANKLE FRACTURE;  Surgeon: Shona Needles, MD;  Location: [facility] ORS;  Service: Orthopedics;  Laterality: Left;  . TRANSURETHRAL RESECTION OF BLADDER  x3 --  2009; 2010; 06-29-2010  . TRANSURETHRAL RESECTION OF BLADDER TUMOR  10/03/2011   Procedure: TRANSURETHRAL RESECTION OF BLADDER TUMOR (TURBT);  Surgeon: Molli Hazard, MD;  Location: [facility];  Service: Urology;  Laterality: N/A;     A IV Location/Drains/Wounds Patient Lines/Drains/Airways Status   Active Line/Drains/Airways    Name:   Placement date:   Placement time:   Site:   Days:   Peripheral IV 05/06/19 Right Forearm   05/06/19    1720    Forearm    less than 1   External Urinary Catheter   03/25/18    0349    -   407          Intake/Output Last 24 hours No intake or output data in the 24 hours ending 05/06/19 1733  Labs/Imaging Results for orders placed or performed during the hospital encounter of 05/06/19 (from the past 48 hour(s))  CBC with Differential     Status: Abnormal   Collection Time: 05/06/19 10:53 AM  Result Value Ref Range   WBC 8.1 4.0 - 10.5 K/uL   RBC 3.72 (L) 3.87 - 5.11 MIL/uL   Hemoglobin 10.7 (L) 12.0 - 15.0 g/dL   HCT 34.6 (L) 36.0 - 46.0 %   MCV 93.0 80.0 - 100.0 fL   MCH 28.8 26.0 - 34.0 pg   MCHC 30.9 30.0 - 36.0 g/dL   RDW 13.7 11.5 - 15.5 %   Platelets 279 150 - 400 K/uL   nRBC 0.0 0.0 - 0.2 %   Neutrophils Relative % 81 %   Neutro Abs 6.7 1.7 - 7.7 K/uL   Lymphocytes Relative 7 %   Lymphs Abs 0.6 (L) 0.7 - 4.0 K/uL   Monocytes Relative 9 %   Monocytes Absolute 0.7 0.1 - 1.0 K/uL   Eosinophils Relative 1 %   Eosinophils Absolute 0.1 0.0 - 0.5 K/uL   Basophils Relative 1 %   Basophils Absolute 0.1 0.0 - 0.1 K/uL   Immature Granulocytes 1 %   Abs Immature Granulocytes 0.05 0.00 - 0.07 K/uL    Comment: Performed at [facility], [address], [place], [place] [id number]  Basic metabolic panel     Status: Abnormal   Collection Time: 05/06/19 10:53 AM  Result Value Ref Range   Sodium 136 135 - 145 mmol/L   Potassium 4.2 3.5 - 5.1 mmol/L   Chloride 100 98 - 111 mmol/L   CO2 26 22 - 32 mmol/L   Glucose, Bld 98 70 - 99 mg/dL   BUN 16 8 - 23 mg/dL   Creatinine, Ser 0.93 0.44 - 1.00 mg/dL   Calcium 8.4 (L) 8.9 - 10.3 mg/dL   GFR calc non Af Amer 54 (L) >60 mL/min   GFR calc Af Amer >60 >60 mL/min   Anion gap 10 5 - 15    Comment: Performed at [facility], [place] [address] [place], [place] [address]  Magnesium     Status: None   Collection Time: 05/06/19 10:53 AM  Result Value Ref Range   Magnesium 1.9 1.7 - 2.4 mg/dL    Comment: Performed at [facility]  [address], [place], [place] [address]  Troponin I (High Sensitivity)     Status: Abnormal   Collection Time: 05/06/19 10:53 AM  Result Value Ref Range   Troponin I (High Sensitivity) 85 (H) <18 ng/L    Comment: (NOTE) Elevated high sensitivity troponin I (hsTnI) values and significant  changes across serial measurements may suggest ACS but many other  chronic and acute conditions are known to elevate hsTnI results.  Refer to the "Links" section for chest pain algorithms and additional  guidance. Performed at [facility], [place] [address], [place], [place] [address]   Brain natriuretic peptide     Status: Abnormal   Collection Time: 05/06/19 10:53 AM  Result Value Ref Range   B Natriuretic Peptide 1,032.4 (H) 0.0 - 100.0 pg/mL    Comment: Performed at [facility] [address], [place], [place] [address]  Troponin I (High Sensitivity)     Status: Abnormal   Collection Time: 05/06/19  4:29 PM  Result Value Ref Range   Troponin I (High Sensitivity) 106 (HH) <18 ng/L    Comment: DELTA CHECK NOTED CRITICAL RESULT CALLED TO, READ BACK BY AND VERIFIED WITH: M Maeleigh Buschman RN AT 1728 ON [id number] BY K FORSYTH (NOTE) Elevated high sensitivity troponin I (hsTnI) values and significant  changes across serial measurements may suggest ACS but many other  chronic and acute conditions are known to elevate hsTnI results.  Refer to the Links section for chest pain algorithms and additional  guidance. Performed at [facility], [place] [address], [place],  [address]   Creatinine, serum     Status: Abnormal   Collection Time: 05/06/19  4:29 PM  Result Value Ref Range   Creatinine, Ser 0.95 0.44 - 1.00 mg/dL   GFR calc non Af Amer 52 (L) >60 mL/min   GFR calc Af Amer >60 >60 mL/min    Comment: Performed at [facility] [address], [place], [place] [address]   Ct Head Wo Contrast  Result Date: 05/06/2019 CLINICAL DATA:  Fall.  On anticoagulation. EXAM: CT HEAD WITHOUT  CONTRAST TECHNIQUE: Contiguous axial images were obtained from the base of the skull through the vertex without intravenous contrast. COMPARISON:  None. FINDINGS: Brain: Extra-axial CSF fluid collection on the left measures 8 mm in thickness. This could be acute or chronic hygroma. No high density blood products are present. Mild midline shift to the right approximately 3 mm. Generalized atrophy. No acute infarct or hemorrhage. 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 Vascular: Negative for hyperdense vessel Skull: Negative Sinuses/Orbits: Mild mucosal edema paranasal sinuses. Bilateral cataract surgery. Other: None IMPRESSION: 8 mm thick subdural hygroma on the left with mild midline shift to the right. This could be due to acute or chronic injury. No high density blood products are present. Electronically Signed   By: Franchot Gallo M.D.   On: 05/06/2019 11:36   Dg Chest Portable 1 View  Result Date: 05/06/2019 CLINICAL DATA:  Shortness of breath EXAM: PORTABLE CHEST 1 VIEW COMPARISON:  04/29/2019 FINDINGS: Mild cardiomegaly. Aortic calcified and tortuous. Mild pulmonary vascular congestion with thickened interstitial markings. Probable small bilateral pleural effusions. No pneumothorax. IMPRESSION: Findings suggestive of CHF with interstitial edema and small bilateral pleural effusions. Electronically Signed   By: Davina Poke M.D.   On: 05/06/2019 11:16    Pending Labs Unresulted Labs (From admission, onward)    Start     Ordered   05/13/19 0500  Creatinine, serum  (enoxaparin (LOVENOX)    CrCl >/= 30 ml/min)  Weekly,   R    Comments: while on enoxaparin therapy    05/06/19 1429   05/08/19 0500  Heparin level (unfractionated)  Daily,   R     05/06/19 1601  05/08/19 0500  APTT  Daily,   R     05/06/19 1601   05/07/19 0500  Comprehensive metabolic panel  Tomorrow morning,   R     05/06/19 1429   05/07/19 0500  CBC  Daily,   R     05/06/19 1601   05/07/19 0100  Heparin  level (unfractionated)  Once-Timed,   STAT     05/06/19 1601   05/07/19 0100  APTT  Once-Timed,   STAT     05/06/19 1601   05/06/19 1429  Creatinine, serum  (enoxaparin (LOVENOX)    CrCl >/= 30 ml/min)  Once,   STAT    Comments: Baseline for enoxaparin therapy IF NOT ALREADY DRAWN.    05/06/19 1429   05/06/19 1231  SARS CORONAVIRUS 2 (TAT 6-24 HRS) Nasopharyngeal Nasopharyngeal Swab  (Asymptomatic/Tier 2 Patients Labs)  Once,   STAT    Question Answer Comment  Is this test for diagnosis or screening Screening   Symptomatic for COVID-19 as defined by CDC No   Hospitalized for COVID-19 No   Admitted to ICU for COVID-19 No   Previously tested for COVID-19 No   Resident in a congregate (group) care setting No   Employed in healthcare setting No   Pregnant No      05/06/19 1231   Unscheduled  Occult blood card to lab, stool  As needed,   R     05/06/19 1429          Vitals/Pain Today's Vitals   05/06/19 1630 05/06/19 1645 05/06/19 1700 05/06/19 1715  BP: (!) 148/46 (!) 125/52 (!) 111/46 (!) 143/46  Pulse:   63 69  Resp: (!) 25 (!) 23 (!) 22 18  Temp:      TempSrc:      SpO2:   95% 92%  Weight:      PainSc:        Isolation Precautions No active isolations  Medications Medications  nitroGLYCERIN (NITROSTAT) SL tablet 0.4 mg (has no administration in time range)  acetaminophen (TYLENOL) tablet 650 mg (has no administration in time range)  ondansetron (ZOFRAN) injection 4 mg (has no administration in time range)  zolpidem (AMBIEN) tablet 5 mg (has no administration in time range)  sodium chloride flush (NS) 0.9 % injection 3 mL (3 mLs Intravenous Not Given 05/06/19 1449)  sodium chloride flush (NS) 0.9 % injection 3 mL (has no administration in time range)  0.9 %  sodium chloride infusion (has no administration in time range)  ALPRAZolam (XANAX) tablet 0.25 mg (has no administration in time range)  sertraline (ZOLOFT) tablet 50 mg (has no administration in time range)   vitamin B-12 (CYANOCOBALAMIN) tablet 1,000 mcg (has no administration in time range)  furosemide (LASIX) injection 40 mg (40 mg Intravenous Given 05/06/19 1635)  levothyroxine (SYNTHROID) tablet 50 mcg (has no administration in time range)  levothyroxine (SYNTHROID) tablet 75 mcg (has no administration in time range)  heparin ADULT infusion 100 units/mL (25000 units/239mL sodium chloride 0.45%) (1,000 Units/hr Intravenous New Bag/Given 05/06/19 1645)    Mobility manual wheelchair High fall risk   Focused Assessments Cardiac Assessment Handoff:  Cardiac Rhythm: Atrial fibrillation, Other (Comment)(Bradycardic) Lab Results  Component Value Date   TROPONINI <0.03 02/08/2018   No results found for: DDIMER Does the Patient currently have chest pain? No     R Recommendations: See Admitting Provider Note  Report given to:   Additional Notes:

## 2019-05-06 NOTE — ED Notes (Signed)
MD on phone with Neuro following CT result.

## 2019-05-06 NOTE — Progress Notes (Signed)
ANTICOAGULATION CONSULT NOTE - Initial Consult  Pharmacy Consult for heparin Indication: atrial fibrillation  Allergies  Allergen Reactions  . Flagyl [Metronidazole] Hives  . Lisinopril Cough    Patient Measurements: Weight: 170 lb (77.1 kg)  Heparin dosing weight: 69.9 kg   Vital Signs: Temp: 98.1 F (36.7 C) (08/31 1020) Temp Source: Oral (08/31 1020) BP: 108/49 (08/31 1345) Pulse Rate: 108 (08/31 1415)  Labs: Recent Labs    05/06/19 1053  HGB 10.7*  HCT 34.6*  PLT 279  CREATININE 0.93  TROPONINIHS 85*    Estimated Creatinine Clearance: 38.8 mL/min (by C-G formula based on SCr of 0.93 mg/dL).   Medical History: Past Medical History:  Diagnosis Date  . DDD (degenerative disc disease), cervical    severe/  auto fusion c2-c5  . Diverticulosis   . Hemorrhoids   . History of adenomatous polyp of colon    1995  adenomatous polyp/  1997 & 2003  hyperplastic polyp's  . History of bladder cancer    s/p  TURBT's, urologist-  Dr [name]  . History of cancer of ureter   . Hypertension   . Hypothyroid   . Nocturia   . Osteoarthritis   . Osteopenia   . Symptomatic bradycardia 05/06/2019    Medications:  Scheduled:  . [START ON 05/07/2019] enoxaparin (LOVENOX) injection  40 mg Subcutaneous Q24H  . furosemide  40 mg Intravenous BID  . levothyroxine  50 mcg Oral QODAY  . [START ON 05/07/2019] levothyroxine  75 mcg Oral QODAY  . sertraline  50 mg Oral QHS  . sodium chloride flush  3 mL Intravenous Q12H  . vitamin B-12  1,000 mcg Oral Daily   Infusions:  . sodium chloride      Assessment: 77 yoF admitted for symptomatic bradycardia. On Eliquis 5 mg BID PTA for Afib, last dose 8/30 @2100 . Pharmacy consulted to dose heparin.  Hgb low 10.7. Plt wnl 279. Scr 0.93, renal fxn stable. No bleeding noted.  Goal of Therapy:  Heparin level 0.3-0.7 units/ml aPTT 66-102 seconds Monitor platelets by anticoagulation protocol: Yes   Plan:  No heparin bolus Heparin infusion @  1000 units/hr Heparin level and aPTT in 8 hours Monitor daily heparin level, aPTT, CBC, and s/sx of bleeding  Berenice Bouton, PharmD PGY1 Pharmacy Resident Office phone: [phone] Phone until 9 pm: [phone] 05/06/2019,3:12 PM

## 2019-05-06 NOTE — ED Triage Notes (Signed)
Pt up this AM with c/o dizziness with movement with one episode of emesis.  Hx of A. Fib which family states has been irregular.  Has had low HR en route.  Stopped Potassium and other unknown meds recently.  Daughter is en route and has more information.  Denies any CP, alert and oriented.  Pt in NAD skin P/W/D.  Initially hypertensive with EMS.   Denies any sx at rest.  Pt does have healing bruising to L orbit.  States she was walking 3 days ago with walker and legs went out striking her L side head.  Denies any LOC, visual changes, HA or neck tenderness.

## 2019-05-06 NOTE — ED Notes (Signed)
Pt returns from CT.  States she feels pressure on the back of her head while laying flat.  Sensation remains slightly.

## 2019-05-06 NOTE — ED Notes (Signed)
Pt remains restful at this time without changes noted.  Daughter remains at bedside.

## 2019-05-06 NOTE — H&P (Addendum)
Cardiology History and Physical:   Patient ID: [name]; [id number]; 07/26/1925   Admit date: 05/06/2019 Date of Consult: 05/06/2019  Primary Care Provider: [name], MD Primary Cardiologist: Kirk Ruths, MD 04/04/2019 tele-visit Primary Electrophysiologist:  None   Patient Profile:   Rebecca Norman is a 85 y.o. female with a hx of D-CHF, PAF on amio & Eliquis, HTN, DDD, EF 65-70% w/ mild AS (mean gradient 11 mmHg) by echo 11/2017 who is being seen today for the evaluation of bradycardia at the request of Dr Roslynn Amble.  History of Present Illness:   Rebecca Norman fell about a week ago.  She did not lose consciousness.  She had gotten up to the bathroom, and then had gone into the kitchen.  This was about 4 AM.  As she headed back to the gym to sit in the recliner, she said her legs got weak and she ended up on the floor.  That is her only recent fall.  Ever since then, she has felt weak.  She has no complaints of chest pain.  She feels her breathing is at baseline.  She feels the Lasix is taking care of the edema.  She may have a little orthopnea, that is chronic with no recent change.  She denies PND.  Feels her dyspnea on exertion is at baseline.  Today, when she got up, she felt lightheaded.  She was a little dizzy.  She had an episode of vomiting.  EMS was called and transported her to the emergency room.  She was initially hypertensive.  To the ER nurse, she reported walking 3 days ago with a walker and her legs went out, striking the left side of her head.  It is unclear if that is the same fall that she described to me in the middle of the night which was a week ago.  She has not had palpitations.  If she has been in atrial fibrillation, she is not aware of it.  She is compliant with her medications including amiodarone and Eliquis.  She had a telemedicine visit with Dr. [name] on 7/30.  No blood pressure or heart rate are recorded from that day.  At that visit, labs  were ordered which were done 8/25.  Her potassium was 5.4.  Her K. Dur was discontinued.  She was to get a repeat BMET in 1 week.  Currently in the emergency room, she is resting comfortably.  On telemetry, she has been in Mobitz 1 heart block, with a heart rate that drops into the 30s at times.  She denies feeling lightheaded or dizzy since she has been in the ER, but has not been out of bed.   Past Medical History:  Diagnosis Date   DDD (degenerative disc disease), cervical    severe/  auto fusion c2-c5   Diverticulosis    Hemorrhoids    History of adenomatous polyp of colon    1995  adenomatous polyp/  1997 & 2003  hyperplastic polyp's   History of bladder cancer    s/p  TURBT's, urologist-  Dr [name]   History of cancer of ureter    Hypertension    Hypothyroid    Nocturia    Osteoarthritis    Osteopenia    Symptomatic bradycardia 05/06/2019    Past Surgical History:  Procedure Laterality Date   BACK SURGERY     CARDIOVERSION N/A 08/09/2017   Procedure: CARDIOVERSION;  Surgeon: Larey Dresser, MD;  Location: [place];  Service: Cardiovascular;  Laterality: N/A;   CATARACT EXTRACTION W/ INTRAOCULAR LENS  IMPLANT, BILATERAL Bilateral    CYSTOSCOPY W/ RETROGRADES  10/03/2011   Procedure: CYSTOSCOPY WITH RETROGRADE PYELOGRAM;  Surgeon: Molli Hazard, MD;  Location: [facility];  Service: Urology;;   CYSTOSCOPY W/ RETROGRADES Bilateral 06/17/2016   Procedure: CYSTOSCOPY WITH RETROGRADE PYELOGRAM;  Surgeon: Alexis Frock, MD;  Location: [facility];  Service: Urology;  Laterality: Bilateral;   CYSTOSCOPY WITH BIOPSY N/A 06/17/2016   Procedure: CYSTOSCOPY WITH BIOPSY AND FULGERATION;  Surgeon: Alexis Frock, MD;  Location: [facility];  Service: Urology;  Laterality: N/A;   LAMINECTOMY  2009   L3 - 4   ORIF ANKLE FRACTURE Left 08/28/2017   Procedure: OPEN REDUCTION INTERNAL FIXATION (ORIF) ANKLE FRACTURE;   Surgeon: Shona Needles, MD;  Location: [facility];  Service: Orthopedics;  Laterality: Left;   TRANSURETHRAL RESECTION OF BLADDER  x3 --  2009; 2010; 06-29-2010   TRANSURETHRAL RESECTION OF BLADDER TUMOR  10/03/2011   Procedure: TRANSURETHRAL RESECTION OF BLADDER TUMOR (TURBT);  Surgeon: Molli Hazard, MD;  Location: [facility];  Service: Urology;  Laterality: N/A;     Prior to Admission medications   Medication Sig Start Date End Date Taking? Authorizing Provider  acetaminophen (TYLENOL) 500 MG tablet Take 500 mg by mouth every 6 (six) hours as needed for headache (pain).    Yes [provider]  amiodarone (PACERONE) 200 MG tablet Take 1 tablet (200 mg total) by mouth daily. 07/03/18  Yes Crenshaw, [name], MD  ELIQUIS 5 MG TABS tablet TAKE 1 TABLET TWICE A DAY Patient taking differently: Take 5 mg by mouth 2 (two) times daily.  11/06/18  Yes Lelon Perla, MD  furosemide (LASIX) 20 MG tablet Take 40 mg (2 tabs) in a.m. And take 20mg  (1 tab) in the PM-PRN Patient taking differently: Take 40 mg by mouth every morning.  04/16/18  Yes Lendon Colonel, NP  levothyroxine (SYNTHROID, LEVOTHROID) 50 MCG tablet Take 50-75 mcg by mouth See admin instructions. Take every other alternating between 81mcg and 75mcg.   Yes [provider]  OXYGEN Inhale 4.5 L into the lungs continuous.    Yes [provider]  sertraline (ZOLOFT) 50 MG tablet Take 50 mg by mouth at bedtime.  09/22/18  Yes [provider]  vitamin B-12 (CYANOCOBALAMIN) 1000 MCG tablet Take 1 tablet (1,000 mcg total) by mouth daily. 02/10/18  Yes Rai, Vernelle Emerald, MD    Inpatient Medications: Scheduled Meds:  Continuous Infusions:  PRN Meds:   Allergies:    Allergies  Allergen Reactions   Flagyl [Metronidazole] Hives   Lisinopril Cough    Social History:   Social History   Socioeconomic History   Marital status: Widowed    Spouse name: Not on file   Number of  children: Not on file   Years of education: Not on file   Highest education level: Not on file  Occupational History   Occupation: Retired  Scientist, product/process development strain: Not on file   Food insecurity    Worry: Not on file    Inability: Not on file   Transportation needs    Medical: Not on file    Non-medical: Not on file  Tobacco Use   Smoking status: Former Smoker    Years: 10.00    Types: Cigarettes    Quit date: 09/25/2005    Years since quitting: 70.6  Smokeless tobacco: Never Used  Substance and Sexual Activity   Alcohol use: Yes   Drug use: No   Sexual activity: Not on file  Lifestyle   Physical activity    Days per week: Not on file    Minutes per session: Not on file   Stress: Not on file  Relationships   Social connections    Talks on phone: Not on file    Gets together: Not on file    Attends religious service: Not on file    Active member of club or organization: Not on file    Attends meetings of clubs or organizations: Not on file    Relationship status: Not on file   Intimate partner violence    Fear of current or ex partner: Not on file    Emotionally abused: Not on file    Physically abused: Not on file    Forced sexual activity: Not on file  Other Topics Concern   Not on file  Social History Narrative   She lives alone in [place].  Daughter helps in her care.    Family History:   Family History  Problem Relation Age of Onset   Breast cancer Sister    Stomach cancer Sister    Hernia Sister 3       HERNIA SURGERY COMPLICATIONS   Colon cancer Neg Hx    Family Status:  Family Status  Relation Name Status   Sister  Alive   Sister  Alive   Mother  Deceased   Father  Deceased   MGM  Deceased   MGF  Deceased   PGM  Deceased   PGF  Deceased   Sister  Deceased   Neg Hx  (Not Specified)    ROS:  Please see the history of present illness.  All other ROS reviewed and negative.     Physical  Exam/Data:   Vitals:   05/06/19 1200 05/06/19 1215 05/06/19 1230 05/06/19 1245  BP: 115/72 136/60 113/78 (!) 145/57  Pulse: (!) 57 (!) 49 (!) 51 (!) 55  Resp: (!) 22 19    Temp:      TempSrc:      SpO2: 100% 95% 92% 95%   No intake or output data in the 24 hours ending 05/06/19 1333 There were no vitals filed for this visit. There is no height or weight on file to calculate BMI.  General:  Well nourished, well developed, elderly female in no acute distress HEENT: normal Lymph: no adenopathy Neck: JVD approximately 9 cm Endocrine:  No thryomegaly Vascular: No carotid bruits; 4/4 extremity pulses 2+, without bruits  Cardiac:  normal S1, S2; RRR; 3/6 systolic murmur at the LUSB  Lungs: Rales bases bilaterally, no wheezing, rhonchi or rales  Abd: soft, nontender, no hepatomegaly  Ext: no edema Musculoskeletal:  No deformities, BUE and BLE strength normal and equal Skin: warm and dry  Neuro:  CNs 2-12 intact, no focal abnormalities noted Psych:  Normal affect   EKG:  The EKG was personally reviewed and demonstrates: 8/31 ECG is Mobitz 1 heart block with a heart rate of 43, last ECG was 03/25/2018 and was sinus rhythm Telemetry:  Telemetry was personally reviewed and demonstrates: In/out of Mobitz 1, heart rate 30s at times  CV studies:   ECHO: 11/14/2017 - Left ventricle: The cavity size was normal. There was severe   concentric hypertrophy. Systolic function was vigorous. The   estimated ejection fraction was in the range of 65% to  70%. Wall   motion was normal; there were no regional wall motion   abnormalities. Doppler parameters are consistent with high   ventricular filling pressure. - Aortic valve: There was very mild stenosis. There was trivial   regurgitation. Peak velocity (S): 215 cm/s. Mean gradient (S): 11   mm Hg. Valve area (Vmax): 1.94 cm^2. - Mitral valve: Severely calcified annulus. Mildly thickened   leaflets . There was mild regurgitation. - Left atrium: The  atrium was moderately dilated.  EVENT MONITOR 07/2017 Several episodes of atrial fibrillation, no bradycardia  Laboratory data:   Chemistry Recent Labs  Lab 04/29/19 1442 05/06/19 1053  NA 137 136  K 5.4* 4.2  CL 95* 100  CO2 24 26  GLUCOSE 88 98  BUN 17 16  CREATININE 0.87 0.93  CALCIUM 8.8 8.4*  GFRNONAA 58* 54*  GFRAA 67 >60  ANIONGAP  --  10    Lab Results  Component Value Date   ALT 11 04/29/2019   AST 15 04/29/2019   ALKPHOS 87 04/29/2019   BILITOT 0.5 04/29/2019   Hematology Recent Labs  Lab 04/29/19 1442 05/06/19 1053  WBC 8.7 8.1  RBC 4.05 3.72*  HGB 11.4 10.7*  HCT 35.0 34.6*  MCV 86 93.0  MCH 28.1 28.8  MCHC 32.6 30.9  RDW 13.2 13.7  PLT 242 279   Cardiac Enzymes High Sensitivity Troponin:   Recent Labs  Lab 05/06/19 1053  TROPONINIHS 85*      BNP Recent Labs  Lab 05/06/19 1053  BNP 1,032.4*    TSH:  Lab Results  Component Value Date   TSH 4.530 (H) 04/29/2019   Magnesium:  Magnesium  Date Value Ref Range Status  05/06/2019 1.9 1.7 - 2.4 mg/dL Final    Comment:    Performed at [facility], [facility] [address] [place], [place] [address]     Radiology/Studies:  Ct Head Wo Contrast  Result Date: 05/06/2019 CLINICAL DATA:  Fall.  On anticoagulation. EXAM: CT HEAD WITHOUT CONTRAST TECHNIQUE: Contiguous axial images were obtained from the base of the skull through the vertex without intravenous contrast. COMPARISON:  None. FINDINGS: Brain: Extra-axial CSF fluid collection on the left measures 8 mm in thickness. This could be acute or chronic hygroma. No high density blood products are present. Mild midline shift to the right approximately 3 mm. Generalized atrophy. No acute infarct or hemorrhage. 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 Vascular: Negative for hyperdense vessel Skull: Negative Sinuses/Orbits: Mild mucosal edema paranasal sinuses. Bilateral cataract surgery. Other: None IMPRESSION: 8 mm thick subdural  hygroma on the left with mild midline shift to the right. This could be due to acute or chronic injury. No high density blood products are present. Electronically Signed   By: Franchot Gallo M.D.   On: 05/06/2019 11:36   Dg Chest Portable 1 View  Result Date: 05/06/2019 CLINICAL DATA:  Shortness of breath EXAM: PORTABLE CHEST 1 VIEW COMPARISON:  04/29/2019 FINDINGS: Mild cardiomegaly. Aortic calcified and tortuous. Mild pulmonary vascular congestion with thickened interstitial markings. Probable small bilateral pleural effusions. No pneumothorax. IMPRESSION: Findings suggestive of CHF with interstitial edema and small bilateral pleural effusions. Electronically Signed   By: Davina Poke M.D.   On: 05/06/2019 11:16    Assessment and Plan:   1.  Symptomatic bradycardia: - She has been on amiodarone since 2019. - She has had 1 fall and likely another one since the hygroma is not recent - We will  admit, hold the amiodarone and follow her heart rate - As amiodarone will take approximately a month to fully wear off, not sure if she needs anything in the meantime - May need EP to see  2.  Chronic anticoagulation: - She is compliant with her Eliquis. - Denies bleeding issues - However, she has 1 fall recently and has a hygroma on CT today that is not recent. - Review the risks/benefits of anticoagulation and decide if it should be continued.  3.  Chronic diastolic CHF: -She has some volume overload by exam, but her sats are good on room air - With bradycardia, discuss if we should go ahead and give her 1 dose of Lasix, or wait  4. Hypothyroid  - TSH minimally elevated, continue home Synthroid  Otherwise, continue home meds Principal Problem:   Symptomatic bradycardia  For questions or updates, please contact [place] Please consult [email or web address] for contact info under Cardiology/STEMI.   Signed, Rosaria Ferries, PA-C  05/06/2019 1:33 PM   The patient was seen, examined and  discussed with Rosaria Ferries, PA-C and I agree with the above.   85 y.o. female with a hx of D-CHF, PAF on amio & Eliquis, HTN, DDD, EF 65-70% w/ mild AS (mean gradient 11 mmHg) by echo 11/2017 who is being seen today for the evaluation of bradycardia. The patient reports dizziness, weakness, loss of energy x 2 weeks, she remembers 1 fall a week ago, CT head consistent with prior fall. Today she experienced an episode of presyncope with a fall without loss of consciousness. She was able to get up on her own, but again felt lightheaded and vomited. No other symptoms, no chest pain or palpitations.  EMS found her hypertensive in sinus bradycardia with blocked PACs.  She has been compliant with her medications including amiodarone and Eliquis. Last ECG a year ago when HR 79 BPM. Echocardiogram in 11/2017 showed severe concentric hypertrophy, LVEF 65% to 70%. Mild aortic stenosis.  TSH 4.5 a week ago, K 4.2, Crea 0.93, BNP 1032, hsTroponin 85, Hb 10.7 Physical exam shows an elderly lady, AAO x3, no JVDs, [id number], 3/6 systolic murmur, crackles at both bases, LE - minimal non-pitting edema  A/P Symptomatic bradycardia Acute diastolic CHF Possible amiodarone toxicity Mild aortic stenosis  We will hold amiodarone, admit for observation, EP will consult, hold Eliquis, start Heparin drip, start lasix 40 mg iv BID, follow I&O closely. I will order an echocardiogram to re-evaluate LVEF and degree aortic stenosis - doubt severe given mild murmur.   Rebecca Dawley, MD 05/06/2019

## 2019-05-06 NOTE — ED Provider Notes (Signed)
[place] EMERGENCY DEPARTMENT Provider Note   CSN: [id number] Arrival date & time: 05/06/19  1015     History   Chief Complaint No chief complaint on file.   HPI Rebecca Norman is a 85 y.o. female.  Presents the emergency department with generalized weakness.  Patient has been feeling generally weak over the past couple weeks but worse over the last few days.  Denies any shortness of breath, chest pain.  States few days ago she fell and hit the left side of her head.  Denies loss of consciousness, no longer having any pain there, states swelling has subsided.  Denies injury otherwise, no neck pain.     HPI  Past Medical History:  Diagnosis Date  . DDD (degenerative disc disease), cervical    severe/  auto fusion c2-c5  . Diverticulosis   . Hemorrhoids   . History of adenomatous polyp of colon    1995  adenomatous polyp/  1997 & 2003  hyperplastic polyp's  . History of bladder cancer urologist-  dr [name]   s/p  TURBT's  . History of cancer of ureter   . Hypertension   . Hypothyroid   . Nocturia   . Osteoarthritis   . Osteopenia     Patient Active Problem List   Diagnosis Date Noted  . Coagulation disorder ([place]) 04/10/2019  . Acute on chronic respiratory failure with hypoxia ([place]) 03/25/2018  . Leukocytosis 03/25/2018  . Acute on chronic diastolic CHF (congestive heart failure) ([place]) 03/25/2018  . B12 deficiency 02/18/2018  . Acute lower UTI 02/07/2018  . Acute on chronic diastolic (congestive) heart failure ([place]) 11/21/2017  . Paroxysmal atrial fibrillation ([place]) 11/21/2017  . Cardiorenal syndrome with renal failure 11/15/2017  . Chronic diastolic CHF (congestive heart failure) ([place]) 08/25/2017  . Current use of long term anticoagulation 08/11/2017  . Hypothyroid 06/13/2017  . Palpitations 06/13/2017  . Heart murmur 06/13/2017  . S/P laminectomy 07/10/2013  . HTN (hypertension) 07/10/2013  . BLADDER CANCER 12/17/2008  . DIVERTICULOSIS OF  COLON 12/12/2008  . COLONIC POLYPS, HYPERPLASTIC, HX OF 12/12/2008    Past Surgical History:  Procedure Laterality Date  . BACK SURGERY    . CARDIOVERSION N/A 08/09/2017   Procedure: CARDIOVERSION;  Surgeon: Larey Dresser, MD;  Location: [facility];  Service: Cardiovascular;  Laterality: N/A;  . CATARACT EXTRACTION W/ INTRAOCULAR LENS  IMPLANT, BILATERAL Bilateral   . CYSTOSCOPY W/ RETROGRADES  10/03/2011   Procedure: CYSTOSCOPY WITH RETROGRADE PYELOGRAM;  Surgeon: Molli Hazard, MD;  Location: [facility];  Service: Urology;;  . Consuela Mimes W/ RETROGRADES Bilateral 06/17/2016   Procedure: CYSTOSCOPY WITH RETROGRADE PYELOGRAM;  Surgeon: Alexis Frock, MD;  Location: [facility];  Service: Urology;  Laterality: Bilateral;  . CYSTOSCOPY WITH BIOPSY N/A 06/17/2016   Procedure: CYSTOSCOPY WITH BIOPSY AND FULGERATION;  Surgeon: Alexis Frock, MD;  Location: [facility];  Service: Urology;  Laterality: N/A;  . LAMINECTOMY  2009   L3 - 4  . ORIF ANKLE FRACTURE Left 08/28/2017   Procedure: OPEN REDUCTION INTERNAL FIXATION (ORIF) ANKLE FRACTURE;  Surgeon: Shona Needles, MD;  Location: [facility];  Service: Orthopedics;  Laterality: Left;  . TRANSURETHRAL RESECTION OF BLADDER  x3 --  2009; 2010; 06-29-2010  . TRANSURETHRAL RESECTION OF BLADDER TUMOR  10/03/2011   Procedure: TRANSURETHRAL RESECTION OF BLADDER TUMOR (TURBT);  Surgeon: Molli Hazard, MD;  Location: [facility];  Service: Urology;  Laterality: N/A;  OB History   No obstetric history on file.      Home Medications    Prior to Admission medications   Medication Sig Start Date End Date Taking? Authorizing Provider  acetaminophen (TYLENOL) 500 MG tablet Take 500 mg by mouth every 6 (six) hours as needed for headache (pain).     [provider]  amiodarone (PACERONE) 200 MG tablet Take 1 tablet (200 mg total) by mouth daily. 07/03/18   Lelon Perla, MD  ELIQUIS 5 MG TABS tablet TAKE 1 TABLET TWICE A DAY 11/06/18   Lelon Perla, MD  furosemide (LASIX) 20 MG tablet Take 40 mg (2 tabs) in a.m. And take 20mg  (1 tab) in the PM-PRN Patient taking differently: Take 40 mg by mouth every morning. Take 40 mg (2 tabs) in a.m. And take 20mg  (1 tab) in the PM-PRN 04/16/18   Lendon Colonel, NP  levothyroxine (SYNTHROID, LEVOTHROID) 50 MCG tablet Take 50-75 mcg by mouth See admin instructions. Take every other alternating between 85mcg and 64mcg.    [provider]  OXYGEN Inhale into the lungs. 1-6 liters as directed    [provider]  sertraline (ZOLOFT) 50 MG tablet TK 1 T PO QD 09/22/18   [provider]  vitamin B-12 (CYANOCOBALAMIN) 1000 MCG tablet Take 1 tablet (1,000 mcg total) by mouth daily. 02/10/18   Mendel Corning, MD    Family History Family History  Problem Relation Age of Onset  . Breast cancer Sister   . Stomach cancer Sister   . Hernia Sister 61       HERNIA SURGERY COMPLICATIONS  . Colon cancer Neg Hx     Social History Social History   Tobacco Use  . Smoking status: Former Smoker    Years: 10.00    Types: Cigarettes    Quit date: 09/25/2005    Years since quitting: 13.6  . Smokeless tobacco: Never Used  Substance Use Topics  . Alcohol use: Yes  . Drug use: No     Allergies   Flagyl [metronidazole] and Lisinopril   Review of Systems Review of Systems  Constitutional: Positive for fatigue. Negative for chills and fever.  HENT: Negative for ear pain and sore throat.   Eyes: Negative for pain and visual disturbance.  Respiratory: Negative for cough and shortness of breath.   Cardiovascular: Negative for chest pain and palpitations.  Gastrointestinal: Negative for abdominal pain and vomiting.  Genitourinary: Negative for dysuria and hematuria.  Musculoskeletal: Negative for arthralgias and back pain.  Skin: Negative for color change and rash.  Neurological: Negative for  seizures and syncope.  All other systems reviewed and are negative.    Physical Exam Updated Vital Signs BP (!) 134/102 (BP Location: Right Arm)   Pulse (!) 54   Temp 98.1 F (36.7 C) (Oral)   Resp (!) 22   SpO2 99%   Physical Exam Vitals signs and nursing note reviewed.  Constitutional:      General: She is not in acute distress.    Appearance: She is well-developed.  HENT:     Head: Normocephalic and atraumatic.  Eyes:     Conjunctiva/sclera: Conjunctivae normal.  Neck:     Musculoskeletal: Neck supple.  Cardiovascular:     Pulses: Normal pulses.     Heart sounds: No murmur.     Comments: Regularly irregular, bradycardia Pulmonary:     Effort: Pulmonary effort is normal. No respiratory distress.     Breath sounds: Normal  breath sounds.  Abdominal:     Palpations: Abdomen is soft.     Tenderness: There is no abdominal tenderness.  Skin:    General: Skin is warm and dry.  Neurological:     Mental Status: She is alert.      ED Treatments / Results  Labs (all labs ordered are listed, but only abnormal results are displayed) Labs Reviewed - No data to display  EKG None  Radiology No results found.  Procedures Procedures (including critical care time)  Medications Ordered in ED Medications - No data to display   Initial Impression / Assessment and Plan / ED Course  I have reviewed the triage vital signs and the nursing notes.  Pertinent labs & imaging results that were available during my care of the patient were reviewed by me and considered in my medical decision making (see chart for details).  Clinical Course as of May 05 1404  Mon May 06, 2019  1100 Complete initial assessment   [RD]  1218 Discussed case with neurosurgery, Dr. Amedeo Plenty, likely findings are not related to most recent fall but rather represents old injury, no acute recommendations but should discuss risk benefits of further anticoagulation   [RD]  1219 Reviewed labs, will consult  cardiology   [RD]    Clinical Course User Index [RD] Lucrezia Starch, MD       85 year old lady presenting with generalized weakness.  Noted to be bradycardic, mostly in 40s but found 30s at times.  Blood pressure stable though.  EKG was concerning for Mobitz type I second-degree AV block.  Suspect symptomatic bradycardia from this problem.  Patient had fallen a couple days ago, noted small hematoma over left forehead, no other injuries on exam.  CT head showed small hygroma.  I discussed with neurosurgery, likely from old injury.  Given patient neuro intact, no further intervention needed at this time.  Discussed case with cardiology who will admit patient for further care and EP consultation. Plan to hold her amio vs pacemaker.  Dr. Meda Coffee accepting physician.  Final Clinical Impressions(s) / ED Diagnoses   Final diagnoses:  Symptomatic bradycardia  Atrioventricular block, Mobitz type 1, Wenckebach  Subdural hygroma    ED Discharge Orders    None       Lucrezia Starch, MD 05/06/19 [id number]

## 2019-05-06 NOTE — ED Notes (Signed)
States continued minor discomfort to head, no CP or dyspnea noted.  Pt remains alert without no outward s/sx.

## 2019-05-06 NOTE — ED Notes (Signed)
Bilateral IV's infiltrated when attempting to give Heparin.  New IV started to R forearm and tolerated well.  Pressure dressing placed to bilateral infiltration sites.

## 2019-05-07 ENCOUNTER — Inpatient Hospital Stay (HOSPITAL_COMMUNITY): Payer: Medicare Other

## 2019-05-07 DIAGNOSIS — I361 Nonrheumatic tricuspid (valve) insufficiency: Secondary | ICD-10-CM

## 2019-05-07 DIAGNOSIS — I340 Nonrheumatic mitral (valve) insufficiency: Secondary | ICD-10-CM

## 2019-05-07 LAB — COMPREHENSIVE METABOLIC PANEL
ALT: 10 U/L (ref 0–44)
AST: 11 U/L — ABNORMAL LOW (ref 15–41)
Albumin: 2.6 g/dL — ABNORMAL LOW (ref 3.5–5.0)
Alkaline Phosphatase: 62 U/L (ref 38–126)
Anion gap: 12 (ref 5–15)
BUN: 17 mg/dL (ref 8–23)
CO2: 26 mmol/L (ref 22–32)
Calcium: 8.3 mg/dL — ABNORMAL LOW (ref 8.9–10.3)
Chloride: 99 mmol/L (ref 98–111)
Creatinine, Ser: 0.91 mg/dL (ref 0.44–1.00)
GFR calc Af Amer: >60 mL/min (ref >60)
GFR calc non Af Amer: 55 mL/min — ABNORMAL LOW (ref >60)
Glucose, Bld: 72 mg/dL (ref 70–99)
Potassium: 3.4 mmol/L — ABNORMAL LOW (ref 3.5–5.1)
Sodium: 137 mmol/L (ref 135–145)
Total Bilirubin: 0.5 mg/dL (ref 0.3–1.2)
Total Protein: 5.3 g/dL — ABNORMAL LOW (ref 6.5–8.1)

## 2019-05-07 LAB — CBC
HCT: 31.4 % — ABNORMAL LOW (ref 36.0–46.0)
Hemoglobin: 10.2 g/dL — ABNORMAL LOW (ref 12.0–15.0)
MCH: 29.2 pg (ref 26.0–34.0)
MCHC: 32.5 g/dL (ref 30.0–36.0)
MCV: 90 fL (ref 80.0–100.0)
Platelets: 247 10*3/uL (ref 150–400)
RBC: 3.49 MIL/uL — ABNORMAL LOW (ref 3.87–5.11)
RDW: 13.7 % (ref 11.5–15.5)
WBC: 5.9 10*3/uL (ref 4.0–10.5)
nRBC: 0 % (ref 0.0–0.2)

## 2019-05-07 LAB — ECHOCARDIOGRAM COMPLETE
Height: 63 in
Weight: 2667.2 oz

## 2019-05-07 LAB — APTT
aPTT: 68 seconds — ABNORMAL HIGH (ref 24–36)
aPTT: 46 seconds — ABNORMAL HIGH (ref 24–36)
aPTT: 51 seconds — ABNORMAL HIGH (ref 24–36)

## 2019-05-07 LAB — GLUCOSE, CAPILLARY: Glucose-Capillary: 237 mg/dL — ABNORMAL HIGH (ref 70–99)

## 2019-05-07 LAB — HEPARIN LEVEL (UNFRACTIONATED): Heparin Unfractionated: 1.44 IU/mL — ABNORMAL HIGH (ref 0.30–0.70)

## 2019-05-07 MED ORDER — ORAL CARE MOUTH RINSE
15.0000 mL | Freq: Two times a day (BID) | OROMUCOSAL | Status: DC
  Administered 2019-05-07 – 2019-05-11 (×7): 15 mL via OROMUCOSAL

## 2019-05-07 MED ORDER — POTASSIUM CHLORIDE CRYS ER 20 MEQ PO TBCR
40.0000 meq | EXTENDED_RELEASE_TABLET | Freq: Every day | ORAL | Status: DC
  Administered 2019-05-07 – 2019-05-09 (×3): 40 meq via ORAL
  Filled 2019-05-07 (×3): qty 2

## 2019-05-07 NOTE — Consult Note (Addendum)
Neurology Consultation  Reason for Consult: Dizziness Referring Physician: Meda Coffee  CC: Feeling off balance when standing up  History is obtained from: Patient and chart  HPI: Rebecca Norman is a 85 y.o. female symptomatic bradycardia, osteopenia, osteoarthritis, hypertension, cancer of uterus, bladder cancer, hemorrhoids, diverticulitis, and degenerative disc disease.  Patient presented to the emergency department secondary to generalized weakness and also feeling what she describes as off balance when she stands up.  She is not a very good historian thus most of the history is obtained from the chart.  Patient does state that for the last 2 weeks she is noted that she feels weaker, at 1 event she stood up and noted slowly that her legs were giving way.  She also notes that she feels off balance when she initially gets up however when she is walking this sensation of off balance does go away.  When asked if when she sits back down the sensation of off balance goes away she does agree with this.  In the emergency room while she was resting comfortably on telemetry it was noticed that she had a Mobitz 1 heart block with a heart rate that dropped into the 30s at times.  As noted in her history she does have history of symptomatic bradycardia.  Currently she is in a recliner and feels fine.  She states that if she sitting down she has never had any of these episodes.  She has been using a walker for quite some time secondary to ankle problems and foot problems.   ROS: A 14 point ROS was performed and is negative except as noted in the HPI.   Past Medical History:  Diagnosis Date  . DDD (degenerative disc disease), cervical    severe/  auto fusion c2-c5  . Diverticulosis   . Hemorrhoids   . History of adenomatous polyp of colon    1995  adenomatous polyp/  1997 & 2003  hyperplastic polyp's  . History of bladder cancer    s/p  TURBT's, urologist-  Dr [name]  . History of cancer of ureter   .  Hypertension   . Hypothyroid   . Nocturia   . Osteoarthritis   . Osteopenia   . Symptomatic bradycardia 05/06/2019     Family History  Problem Relation Age of Onset  . Breast cancer Sister   . Stomach cancer Sister   . Hernia Sister 30       HERNIA SURGERY COMPLICATIONS  . Colon cancer Neg Hx     Social History:   reports that she quit smoking about 13 years ago. Her smoking use included cigarettes. She quit after 10.00 years of use. She has never used smokeless tobacco. She reports current alcohol use. She reports that she does not use drugs.  Medications  Current Facility-Administered Medications:  .  0.9 %  sodium chloride infusion, 250 mL, Intravenous, PRN, Barrett, Rhonda G, PA-C .  acetaminophen (TYLENOL) tablet 650 mg, 650 mg, Oral, Q4H PRN, Barrett, Rhonda G, PA-C, 650 mg at 05/06/19 2243 .  ALPRAZolam (XANAX) tablet 0.25 mg, 0.25 mg, Oral, BID PRN, Barrett, Rhonda G, PA-C, 0.25 mg at 05/06/19 2244 .  furosemide (LASIX) injection 40 mg, 40 mg, Intravenous, BID, Dorothy Spark, MD, 40 mg at 05/07/19 1220 .  heparin ADULT infusion 100 units/mL (25000 units/266mL sodium chloride 0.45%), 1,150 Units/hr, Intravenous, Continuous, Dorothy Spark, MD, Last Rate: 11.5 mL/hr at 05/07/19 1230, 1,150 Units/hr at 05/07/19 1230 .  levothyroxine (SYNTHROID) tablet  50 mcg, 50 mcg, Oral, QODAY, Dorothy Spark, MD .  levothyroxine (SYNTHROID) tablet 75 mcg, 75 mcg, Oral, Mickeal Needy, MD, 75 mcg at 05/07/19 0532 .  MEDLINE mouth rinse, 15 mL, Mouth Rinse, BID, Dorothy Spark, MD .  nitroGLYCERIN (NITROSTAT) SL tablet 0.4 mg, 0.4 mg, Sublingual, Q5 Min x 3 PRN, Barrett, Rhonda G, PA-C .  ondansetron (ZOFRAN) injection 4 mg, 4 mg, Intravenous, Q6H PRN, Barrett, Rhonda G, PA-C .  potassium chloride SA (K-DUR) CR tablet 40 mEq, 40 mEq, Oral, Daily, Barrett, Rhonda G, PA-C, 40 mEq at 05/07/19 0948 .  sertraline (ZOLOFT) tablet 50 mg, 50 mg, Oral, QHS, Barrett, Rhonda  G, PA-C, 50 mg at 05/06/19 2244 .  sodium chloride flush (NS) 0.9 % injection 3 mL, 3 mL, Intravenous, Q12H, Barrett, Rhonda G, PA-C .  sodium chloride flush (NS) 0.9 % injection 3 mL, 3 mL, Intravenous, PRN, Barrett, Rhonda G, PA-C .  vitamin B-12 (CYANOCOBALAMIN) tablet 1,000 mcg, 1,000 mcg, Oral, Daily, Barrett, Rhonda G, PA-C, 1,000 mcg at 05/07/19 0947 .  zolpidem (AMBIEN) tablet 5 mg, 5 mg, Oral, QHS PRN, Barrett, Evelene Croon, PA-C   Exam: Current vital signs: BP (!) 108/36   Pulse (!) 49   Temp 97.9 F (36.6 C) (Oral)   Resp 16   Ht 5\' 3"  (1.6 m)   Wt 75.6 kg   SpO2 98%   BMI 29.53 kg/m  Vital signs in last 24 hours: Temp:  [97.6 F (36.4 C)-98.1 F (36.7 C)] 97.9 F (36.6 C) (09/01 1134) Pulse Rate:  [41-69] 49 (09/01 1134) Resp:  [16-25] 16 (09/01 1134) BP: (108-148)/(36-68) 108/36 (09/01 1134) SpO2:  [92 %-100 %] 98 % (09/01 1134) Weight:  [75.6 kg-76.4 kg] 75.6 kg (09/01 0500)  Physical Exam  Constitutional: Appears well-developed and well-nourished.  Psych: Affect appropriate to situation Eyes: No scleral injection HENT: No OP obstrucion Head: Normocephalic.  Cardiovascular: Normal rate and regular rhythm.  Respiratory: Effort normal, non-labored breathing GI: Soft.  No distension. There is no tenderness.  Skin: WDI  Neuro: Mental Status: Patient is awake, alert, oriented to person, place, month, year, and situation. Patient is able to give a history but not a clear detailed history  Cranial Nerves: II: Visual Fields are full.  III,IV, VI: EOMI without ptosis or diploplia. Pupils equal, round and reactive to light V: Facial sensation is symmetric to temperature VII: Facial movement is symmetric.  VIII: hearing is intact to voice X: Palat elevates symmetrically XI: Shoulder shrug is symmetric. XII: tongue is midline without atrophy or fasciculations.  Motor: Tone is normal. Bulk is normal. 5/5 strength was present in all four extremities.   Sensory: Decreased sensation to light touch and cold temperature up to midcalf.  She has intact proprioception but no vibratory sensation at the ankles. Deep Tendon Reflexes: 2+ and symmetric in the biceps no reflexes at the ankles and or knees Plantars: Toes are downgoing bilaterally.  Cerebellar: FNF grossly intact   Labs I have reviewed labs in epic and the results pertinent to this consultation are:   CBC    Component Value Date/Time   WBC 5.9 05/07/2019 0434   RBC 3.49 (L) 05/07/2019 0434   HGB 10.2 (L) 05/07/2019 0434   HGB 11.4 04/29/2019 1442   HCT 31.4 (L) 05/07/2019 0434   HCT 35.0 04/29/2019 1442   PLT 247 05/07/2019 0434   PLT 242 04/29/2019 1442   MCV 90.0 05/07/2019 0434   MCV 86 04/29/2019 1442  MCH 29.2 05/07/2019 0434   MCHC 32.5 05/07/2019 0434   RDW 13.7 05/07/2019 0434   RDW 13.2 04/29/2019 1442   LYMPHSABS 0.6 (L) 05/06/2019 1053   MONOABS 0.7 05/06/2019 1053   EOSABS 0.1 05/06/2019 1053   BASOSABS 0.1 05/06/2019 1053    CMP     Component Value Date/Time   NA 137 05/07/2019 0434   NA 137 04/29/2019 1442   K 3.4 (L) 05/07/2019 0434   CL 99 05/07/2019 0434   CO2 26 05/07/2019 0434   GLUCOSE 72 05/07/2019 0434   BUN 17 05/07/2019 0434   BUN 17 04/29/2019 1442   CREATININE 0.91 05/07/2019 0434   CALCIUM 8.3 (L) 05/07/2019 0434   PROT 5.3 (L) 05/07/2019 0434   PROT 6.2 04/29/2019 1442   ALBUMIN 2.6 (L) 05/07/2019 0434   ALBUMIN 3.7 04/29/2019 1442   AST 11 (L) 05/07/2019 0434   ALT 10 05/07/2019 0434   ALKPHOS 62 05/07/2019 0434   BILITOT 0.5 05/07/2019 0434   BILITOT 0.5 04/29/2019 1442   GFRNONAA 55 (L) 05/07/2019 0434   GFRAA >60 05/07/2019 0434    Lipid Panel  No results found for: CHOL, TRIG, HDL, CHOLHDL, VLDL, LDLCALC, LDLDIRECT   Imaging I have reviewed the images obtained:  CT-scan of the brain-8 mm thick subdural hygroma on the left with mild midline shift to the right.  Orthostatic vital signs that were obtained  today showed that her systolic blood pressure dropped by 5 points when going from sitting to standing, diastolic blood pressure actually jumped from 52 up to 111 from sitting to standing however her heart rate went up by 14 bpm.  Blood pressures: Patient's blood pressures while in the hospital have been fluctuating between systolic 0000000 down to [id number] and diastolic [id number] down to 49.  Heart rate: Patient's heart rate while in the hospital has trended in the 40s with the lowest being 40 but also has increased up to 55.               Etta Quill PA-C [facility] [phone]  M-F  (9:00 am- 5:00 PM)  05/07/2019, 4:29 PM     Assessment:  This is a 85 year old female who is been noticing a 2-week history of lower extremity weakness in addition to feeling off balance when getting up from a seated position.  She states as noted, the off-balance sensation is only temporary and then goes away.  As noted she does have symptomatic bradycardia.  I do question if much of her dizziness may be positional secondary to decreased perfusion but most likely multifactorial.   Recommendations: -TED hose - When standing holding position until blood pressure normalizes - Increase head of bed by 20 degrees at home  NEUROHOSPITALIST ADDENDUM Performed a face to face diagnostic evaluation.   I have reviewed the contents of history and physical exam as documented by PA/ARNP/Resident and agree with above documentation.  I have discussed and formulated the above plan as documented. Edits to the note have been made as needed.  85 year old female with past medical history significant for congestive heart failure, paroxysmal atrial fibrillation on amiodarone and Eliquis, hypertension admitted for dizziness and bradycardia on 8/31 with heart rates in the 30s on presentation due to a Mobitz 1 heart block. Patient being evaluated by cardiology for bradycardia and during the cardiologist's evaluation patient noted to  have trouble standing up despite normal orthostatic vitals.  Neurology was consulted for evaluation of dizziness and gait unsteadiness as well as history  of multiple falls.  Unfortunately the patient is a poor historian.  Patient also drowsy as she she had received Xanax and therefore when to speak to her again at 6 AM in the morning.  She was awake and stated that her symptoms have been going on for about 2 weeks.  She states that when she goes from sitting to standing position she feels lightheaded.  She denies sensation of room spinning/vertigo.  She also denies feeling unsteady in her gait and states that she is normally able to walk however without any reason her legs tend to" give out" where her knee buckles and she has a fall.  She had a recent fall only a week ago.   On my examination patient has 4+/5 strength in both lower extremities.  She has generalized muscle wasting throughout.  Sensory examination is normal and patient denies any paresthesias over her feet.  Cranial nerve examination is also normal.  Patient does not have any dysmetria to finger-to-nose testing. Patient did receive a CT head, no acute findings suggestive of stroke. Does show hygroma.   Impression Falls -likely mechanical given chronic deconditioning, age and generalized weakness.  Possibly made worse with current medical conditions such as acute on chronic CHF, bradycardia.  Orthostatic dizziness/light headedness.    Recommendations  MRI brain ordered pending to rule out posterior circulation infarct that can often be missed on CT, although less likely a stroke.  Continue to work with PT Orville Govern    Karena Addison Aroor MD [facility] [phone]   If 7pm to 7am, please call on call as listed on AMION.

## 2019-05-07 NOTE — NC FL2 (Signed)
[place] LEVEL OF CARE SCREENING TOOL     IDENTIFICATION  Patient Name: Rebecca Norman Birthdate: 1925-08-28 Sex: female Admission Date (Current Location): 05/06/2019  [facility] and [place] Number:  Herbalist and Address:  [facility], [place] [address] [place], [place] [address]      Provider Number: [id number]  Attending Physician Name and Address:  Dorothy Spark, MD  Relative Name and Phone Number:       Current Level of Care: Hospital Recommended Level of Care: [place] Prior Approval Number:    Date Approved/Denied:   PASRR Number: [id number]  Discharge Plan: SNF    Current Diagnoses: Patient Active Problem List   Diagnosis Date Noted  . Symptomatic bradycardia 05/06/2019  . Coagulation disorder ([place]) 04/10/2019  . Acute on chronic respiratory failure with hypoxia ([place]) 03/25/2018  . Leukocytosis 03/25/2018  . Acute on chronic diastolic CHF (congestive heart failure) ([place]) 03/25/2018  . B12 deficiency 02/18/2018  . Acute lower UTI 02/07/2018  . Acute on chronic diastolic (congestive) heart failure ([place]) 11/21/2017  . Paroxysmal atrial fibrillation ([place]) 11/21/2017  . Cardiorenal syndrome with renal failure 11/15/2017  . Chronic diastolic CHF (congestive heart failure) ([place]) 08/25/2017  . Current use of long term anticoagulation 08/11/2017  . Hypothyroid 06/13/2017  . Palpitations 06/13/2017  . Heart murmur 06/13/2017  . S/P laminectomy 07/10/2013  . HTN (hypertension) 07/10/2013  . BLADDER CANCER 12/17/2008  . DIVERTICULOSIS OF COLON 12/12/2008  . COLONIC POLYPS, HYPERPLASTIC, HX OF 12/12/2008    Orientation RESPIRATION BLADDER Height & Weight     Self, Time, Situation, Place  O2(Nasal Cannula 4L) External catheter, Incontinent(05/06/19) Weight: 166 lb 11.2 oz (75.6 kg) Height:  5\' 3"  (160 cm)  BEHAVIORAL SYMPTOMS/MOOD NEUROLOGICAL BOWEL NUTRITION STATUS      Continent Diet(heart  healthy)  AMBULATORY STATUS COMMUNICATION OF NEEDS Skin   Extensive Assist Verbally Normal                       Personal Care Assistance Level of Assistance  Dressing, Feeding, Bathing Bathing Assistance: Maximum assistance Feeding assistance: Limited assistance Dressing Assistance: Maximum assistance     Functional Limitations Info  Sight, Speech, Hearing Sight Info: Adequate Hearing Info: Adequate Speech Info: Adequate    SPECIAL CARE FACTORS FREQUENCY  PT (By licensed PT), OT (By licensed OT)     PT Frequency: 5x OT Frequency: 5x            Contractures Contractures Info: Not present    Additional Factors Info  Code Status, Allergies Code Status Info: DNR Allergies Info: Flagyl (Metronidazole), Lisinopril           Current Medications (05/07/2019):  This is the current hospital active medication list Current Facility-Administered Medications  Medication Dose Route Frequency Provider Last Rate Last Dose  . 0.9 %  sodium chloride infusion  250 mL Intravenous PRN Barrett, Rhonda G, PA-C      . acetaminophen (TYLENOL) tablet 650 mg  650 mg Oral Q4H PRN Barrett, Evelene Croon, PA-C   650 mg at 05/06/19 2243  . ALPRAZolam Duanne Moron) tablet 0.25 mg  0.25 mg Oral BID PRN Barrett, Evelene Croon, PA-C   0.25 mg at 05/06/19 2244  . furosemide (LASIX) injection 40 mg  40 mg Intravenous BID Dorothy Spark, MD   40 mg at 05/07/19 1220  . heparin ADULT infusion 100 units/mL (25000 units/275mL sodium chloride 0.45%)  1,150 Units/hr Intravenous Continuous  Dorothy Spark, MD 11.5 mL/hr at 05/07/19 1230 1,150 Units/hr at 05/07/19 1230  . levothyroxine (SYNTHROID) tablet 50 mcg  50 mcg Oral Mickeal Needy, MD      . levothyroxine (SYNTHROID) tablet 75 mcg  75 mcg Oral Mickeal Needy, MD   75 mcg at 05/07/19 0532  . MEDLINE mouth rinse  15 mL Mouth Rinse BID Dorothy Spark, MD      . nitroGLYCERIN (NITROSTAT) SL tablet 0.4 mg  0.4 mg Sublingual Q5 Min x 3 PRN  Barrett, Rhonda G, PA-C      . ondansetron (ZOFRAN) injection 4 mg  4 mg Intravenous Q6H PRN Barrett, Rhonda G, PA-C      . potassium chloride SA (K-DUR) CR tablet 40 mEq  40 mEq Oral Daily Barrett, Rhonda G, PA-C   40 mEq at 05/07/19 0948  . sertraline (ZOLOFT) tablet 50 mg  50 mg Oral QHS Barrett, Rhonda G, PA-C   50 mg at 05/06/19 2244  . sodium chloride flush (NS) 0.9 % injection 3 mL  3 mL Intravenous Q12H Barrett, Rhonda G, PA-C      . sodium chloride flush (NS) 0.9 % injection 3 mL  3 mL Intravenous PRN Barrett, Rhonda G, PA-C      . vitamin B-12 (CYANOCOBALAMIN) tablet 1,000 mcg  1,000 mcg Oral Daily Barrett, Rhonda G, PA-C   1,000 mcg at 05/07/19 0947  . zolpidem (AMBIEN) tablet 5 mg  5 mg Oral QHS PRN Barrett, Evelene Croon, PA-C         Discharge Medications: Please see discharge summary for a list of discharge medications.  Relevant Imaging Results:  Relevant Lab Results:   Additional Information SSN:[id number]  Gerrianne Scale Charday Capetillo, LCSW

## 2019-05-07 NOTE — Progress Notes (Signed)
[place] for Heparin Indication: atrial fibrillation  Allergies  Allergen Reactions  . Flagyl [Metronidazole] Hives  . Lisinopril Cough    Patient Measurements: Height: 5\' 3"  (160 cm) Weight: 166 lb 11.2 oz (75.6 kg) IBW/kg (Calculated) : 52.4  Heparin dosing weight: 69.9 kg   Vital Signs: Temp: 97.6 F (36.4 C) (09/01 1644) Temp Source: Oral (09/01 1644) BP: 154/57 (09/01 1644) Pulse Rate: 49 (09/01 1134)  Labs: Recent Labs    05/06/19 1053 05/06/19 1629 05/06/19 1840 05/07/19 0121 05/07/19 0434 05/07/19 2030  HGB 10.7*  --   --   --  10.2*  --   HCT 34.6*  --   --   --  31.4*  --   PLT 279  --   --   --  247  --   APTT  --   --   --  68* 46* 51*  HEPARINUNFRC  --   --   --  1.44*  --   --   CREATININE 0.93 0.95 0.83  --  0.91  --   TROPONINIHS 85* 106* 105*  --   --   --     Estimated Creatinine Clearance: 39.2 mL/min (by C-G formula based on SCr of 0.91 mg/dL).   Assessment: 47 yoF admitted for symptomatic bradycardia. On Eliquis 5 mg BID PTA for Afib, last dose 8/30 @2100 . Pharmacy consulted to dose heparin.  aPTT level this evening remains SUBtherapeutic despite a rate increase this AM (aPTT 51 << 46, goal of 66-102). No bleeding or issues noted per RN report.   Goal of Therapy:  Heparin level 0.3-0.7 units/ml aPTT 66-102 seconds Monitor platelets by anticoagulation protocol: Yes   Plan:  - Increase Heparin drip rate to 1250 units/hr (12.5 ml/hr) - Will continue to monitor for any signs/symptoms of bleeding and will follow up with heparin level in 8 hours   Thank you for allowing pharmacy to be a part of this patient's care.  Alycia Rossetti, PharmD, BCPS Clinical Pharmacist Clinical phone for 05/07/2019: [phone] 05/07/2019 10:10 PM   **Pharmacist phone directory can now be found on [email or web address] ([email or web address]).  Listed under [place].

## 2019-05-07 NOTE — Progress Notes (Signed)
[facility] for Heparin Indication: atrial fibrillation  Allergies  Allergen Reactions  . Flagyl [Metronidazole] Hives  . Lisinopril Cough    Patient Measurements: Height: 5\' 3"  (160 cm) Weight: 166 lb 11.2 oz (75.6 kg) IBW/kg (Calculated) : 52.4  Heparin dosing weight: 69.9 kg   Vital Signs: Temp: 97.6 F (36.4 C) (09/01 0531) Temp Source: Oral (09/01 0531) BP: 140/57 (09/01 0531) Pulse Rate: 51 (09/01 0044)  Labs: Recent Labs    05/06/19 1053 05/06/19 1629 05/06/19 1840 05/07/19 0121 05/07/19 0434  HGB 10.7*  --   --   --  10.2*  HCT 34.6*  --   --   --  31.4*  PLT 279  --   --   --  247  APTT  --   --   --  68* 46*  HEPARINUNFRC  --   --   --  1.44*  --   CREATININE 0.93 0.95 0.83  --  0.91  TROPONINIHS 85* 106* 105*  --   --     Estimated Creatinine Clearance: 39.2 mL/min (by C-G formula based on SCr of 0.91 mg/dL).   Assessment: 31 yoF admitted for symptomatic bradycardia. On Eliquis 5 mg BID PTA for Afib, last dose 8/30 @2100 . Pharmacy consulted to dose heparin.  Hgb low 10.7. Plt wnl 279. Scr 0.93, renal fxn stable. No bleeding noted.  9/1 AM update:  APTT low this AM Using aPTT to dose given Apixaban influence on heparin levels  Goal of Therapy:  Heparin level 0.3-0.7 units/ml aPTT 66-102 seconds Monitor platelets by anticoagulation protocol: Yes   Plan:  Heparin infusion to 1150 units / hr 8 hour PTT Monitor daily heparin level, aPTT, CBC, and s/sx of bleeding  Thank you Anette Guarneri, PharmD [phone]

## 2019-05-07 NOTE — TOC Initial Note (Signed)
Transition of Care [facility]) - Initial/Assessment Note    Patient Details  Name: Rebecca Norman MRN: [id number] Date of Birth: June 17, 1925  [facility] CM/SW Contact:    Eileen Stanford, LCSW Phone Number: 05/07/2019, 1:48 PM  Clinical Narrative:      Pt states she lives alone. Pt did not agree to SNF but did agree that CSW could send out referral. Pt states "if I have to go somewhere I guess [facility], but I don't want to go." Pt continued to states "don't commit me." CSW believes pt was referring to "commiting to a SNF." CSW to provide b/o once available.            Expected Discharge Plan: Skilled Nursing Facility Barriers to Discharge: Continued Medical Work up   Patient Goals and CMS Choice Patient states their goals for this hospitalization and ongoing recovery are:: to get better      Expected Discharge Plan and Services Expected Discharge Plan: [place] In-house Referral: NA   Post Acute Care Choice: NA Living arrangements for the past 2 months: Single Family Home                                      Prior Living Arrangements/Services Living arrangements for the past 2 months: Single Family Home Lives with:: Self Patient language and need for interpreter reviewed:: Yes Do you feel safe going back to the place where you live?: Yes      Need for Family Participation in Patient Care: Yes (Comment) Care giver support system in place?: Yes (comment)   Criminal Activity/Legal Involvement Pertinent to Current Situation/Hospitalization: No - Comment as needed  Activities of Daily Living      Permission Sought/Granted Permission sought to share information with : Investment banker, corporate granted to share info w AGENCY: [facility] [facility]        Emotional Assessment Appearance:: Appears stated age Attitude/Demeanor/Rapport: Engaged Affect (typically observed): Accepting, Appropriate, Calm Orientation: : Oriented  to Self, Oriented to Place, Oriented to  Time, Oriented to Situation Alcohol / Substance Use: Not Applicable Psych Involvement: No (comment)  Admission diagnosis:  Subdural hygroma [G96.0] Symptomatic bradycardia [R00.1] Atrioventricular block, Mobitz type 1, Wenckebach [I44.1] Patient Active Problem List   Diagnosis Date Noted  . Symptomatic bradycardia 05/06/2019  . Coagulation disorder ([place]) 04/10/2019  . Acute on chronic respiratory failure with hypoxia ([place]) 03/25/2018  . Leukocytosis 03/25/2018  . Acute on chronic diastolic CHF (congestive heart failure) ([place]) 03/25/2018  . B12 deficiency 02/18/2018  . Acute lower UTI 02/07/2018  . Acute on chronic diastolic (congestive) heart failure ([place]) 11/21/2017  . Paroxysmal atrial fibrillation ([place]) 11/21/2017  . Cardiorenal syndrome with renal failure 11/15/2017  . Chronic diastolic CHF (congestive heart failure) ([place]) 08/25/2017  . Current use of long term anticoagulation 08/11/2017  . Hypothyroid 06/13/2017  . Palpitations 06/13/2017  . Heart murmur 06/13/2017  . S/P laminectomy 07/10/2013  . HTN (hypertension) 07/10/2013  . BLADDER CANCER 12/17/2008  . DIVERTICULOSIS OF COLON 12/12/2008  . COLONIC POLYPS, HYPERPLASTIC, HX OF 12/12/2008   PCP:  [name], MD Pharmacy:   [facility] [place], [place] [address] Phone: [phone] Fax: [phone]  [facility] [place], [place] -  [address] [place] [address] Phone: [phone] Fax: [phone]     Social Determinants of Health (SDOH) Interventions    Readmission Risk Interventions No flowsheet data found.

## 2019-05-07 NOTE — Progress Notes (Signed)
[place] for Heparin Indication: atrial fibrillation  Allergies  Allergen Reactions  . Flagyl [Metronidazole] Hives  . Lisinopril Cough    Patient Measurements: Height: 5\' 3"  (160 cm) Weight: 168 lb 6.9 oz (76.4 kg) IBW/kg (Calculated) : 52.4  Heparin dosing weight: 69.9 kg   Vital Signs: Temp: 97.7 F (36.5 C) (08/31 2108) Temp Source: Oral (08/31 2108) BP: 132/59 (09/01 0044) Pulse Rate: 51 (09/01 0044)  Labs: Recent Labs    05/06/19 1053 05/06/19 1629 05/06/19 1840 05/07/19 0121  HGB 10.7*  --   --   --   HCT 34.6*  --   --   --   PLT 279  --   --   --   APTT  --   --   --  68*  HEPARINUNFRC  --   --   --  1.44*  CREATININE 0.93 0.95 0.83  --   TROPONINIHS 85* 106* 105*  --     Estimated Creatinine Clearance: 43.2 mL/min (by C-G formula based on SCr of 0.83 mg/dL).   Medical History: Past Medical History:  Diagnosis Date  . DDD (degenerative disc disease), cervical    severe/  auto fusion c2-c5  . Diverticulosis   . Hemorrhoids   . History of adenomatous polyp of colon    1995  adenomatous polyp/  1997 & 2003  hyperplastic polyp's  . History of bladder cancer    s/p  TURBT's, urologist-  Dr [name]  . History of cancer of ureter   . Hypertension   . Hypothyroid   . Nocturia   . Osteoarthritis   . Osteopenia   . Symptomatic bradycardia 05/06/2019    Medications:  Scheduled:  . furosemide  40 mg Intravenous BID  . levothyroxine  50 mcg Oral QODAY  . levothyroxine  75 mcg Oral QODAY  . sertraline  50 mg Oral QHS  . sodium chloride flush  3 mL Intravenous Q12H  . vitamin B-12  1,000 mcg Oral Daily   Infusions:  . sodium chloride    . heparin 1,000 Units/hr (05/06/19 1645)    Assessment: 24 yoF admitted for symptomatic bradycardia. On Eliquis 5 mg BID PTA for Afib, last dose 8/30 @2100 . Pharmacy consulted to dose heparin.  Hgb low 10.7. Plt wnl 279. Scr 0.93, renal fxn stable. No bleeding noted.  9/1 AM  update:  APTT therapeutic this AM Using aPTT to dose given Apixaban influence on heparin levels  Goal of Therapy:  Heparin level 0.3-0.7 units/ml aPTT 66-102 seconds Monitor platelets by anticoagulation protocol: Yes   Plan:  Heparin infusion @ 1000 units/hr Confirmatory aPTT with AM labs Monitor daily heparin level, aPTT, CBC, and s/sx of bleeding  Narda Bonds, PharmD, BCPS Clinical Pharmacist Phone: [phone]

## 2019-05-07 NOTE — Progress Notes (Addendum)
Progress Note  Patient Name: Rebecca Norman Date of Encounter: 05/07/2019  Primary Cardiologist:  Kirk Ruths, MD  Subjective   Says weakness is recent onset, thinks it started yesterday.   Inpatient Medications    Scheduled Meds: . furosemide  40 mg Intravenous BID  . levothyroxine  50 mcg Oral QODAY  . levothyroxine  75 mcg Oral QODAY  . mouth rinse  15 mL Mouth Rinse BID  . sertraline  50 mg Oral QHS  . sodium chloride flush  3 mL Intravenous Q12H  . vitamin B-12  1,000 mcg Oral Daily   Continuous Infusions: . sodium chloride    . heparin 1,000 Units/hr (05/06/19 1645)   PRN Meds: sodium chloride, acetaminophen, ALPRAZolam, nitroGLYCERIN, ondansetron (ZOFRAN) IV, sodium chloride flush, zolpidem   Vital Signs    Vitals:   05/06/19 2108 05/07/19 0044 05/07/19 0500 05/07/19 0531  BP:  (!) 132/59  (!) 140/57  Pulse:  (!) 51    Resp:    18  Temp: 97.7 F (36.5 C)   97.6 F (36.4 C)  TempSrc: Oral   Oral  SpO2:  100%  99%  Weight:   75.6 kg   Height:        Intake/Output Summary (Last 24 hours) at 05/07/2019 0754 Last data filed at 05/07/2019 0600 Gross per 24 hour  Intake 367.36 ml  Output 1375 ml  Net -1007.64 ml   Filed Weights   05/06/19 1500 05/06/19 1839 05/07/19 0500  Weight: 77.1 kg 76.4 kg 75.6 kg   Last Weight  Most recent update: 05/07/2019  6:09 AM   Weight  75.6 kg (166 lb 11.2 oz)           Weight change:  Orthostatic VS Position BP HR  Lying 140/57 53  Sitting 157/52 53  Standing 143/111, pt [place], had trouble standing 67  Standing at 3" Pt unable to stand x 3 minutes         Telemetry    SR, S brady w/ HR 40s at times, long PR interval of 390 ms, occ non-conducted PACs - Personally Reviewed  ECG    09/01, sinus brady w/ HR 43, PR 366 ms, LVH w/ repol changes - Personally Reviewed  Physical Exam   General: Well developed, well nourished, female appearing in no acute distress. Head: Normocephalic, atraumatic.  Neck:  Supple without bruits, JVD not elevated. Lungs:  Resp regular and unlabored, some fine rales  Heart: RRR, S1, S2, no S3, S4, 3/6 holosystolic murmur at the LUSB; no rub. Abdomen: Soft, non-tender, non-distended with normoactive bowel sounds. No hepatomegaly. No rebound/guarding. No obvious abdominal masses. Extremities: No clubbing, cyanosis, no edema. Distal pedal pulses are 2+ bilaterally. Neuro: Alert and oriented X 3. Moves all extremities spontaneously. Psych: Normal affect.  Labs    Hematology Recent Labs  Lab 05/06/19 1053 05/07/19 0434  WBC 8.1 5.9  RBC 3.72* 3.49*  HGB 10.7* 10.2*  HCT 34.6* 31.4*  MCV 93.0 90.0  MCH 28.8 29.2  MCHC 30.9 32.5  RDW 13.7 13.7  PLT 279 247    Chemistry Recent Labs  Lab 05/06/19 1053 05/06/19 1629 05/06/19 1840 05/07/19 0434  NA 136  --   --  137  K 4.2  --   --  3.4*  CL 100  --   --  99  CO2 26  --   --  26  GLUCOSE 98  --   --  72  BUN 16  --   --  17  CREATININE 0.93 0.95 0.83 0.91  CALCIUM 8.4*  --   --  8.3*  PROT  --   --   --  5.3*  ALBUMIN  --   --   --  2.6*  AST  --   --   --  11*  ALT  --   --   --  10  ALKPHOS  --   --   --  62  BILITOT  --   --   --  0.5  GFRNONAA 54* 52* >60 55*  GFRAA >60 >60 >60 >60  ANIONGAP 10  --   --  12     High Sensitivity Troponin:   Recent Labs  Lab 05/06/19 1053 05/06/19 1629 05/06/19 1840  TROPONINIHS 85* 106* 105*      BNP Recent Labs  Lab 05/06/19 1053  BNP 1,032.4*     Radiology    Ct Head Wo Contrast  Result Date: 05/06/2019 CLINICAL DATA:  Fall.  On anticoagulation. EXAM: CT HEAD WITHOUT CONTRAST TECHNIQUE: Contiguous axial images were obtained from the base of the skull through the vertex without intravenous contrast. COMPARISON:  None. FINDINGS: Brain: Extra-axial CSF fluid collection on the left measures 8 mm in thickness. This could be acute or chronic hygroma. No high density blood products are present. Mild midline shift to the right approximately 3 mm.  Generalized atrophy. No acute infarct or hemorrhage. 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 Vascular: Negative for hyperdense vessel Skull: Negative Sinuses/Orbits: Mild mucosal edema paranasal sinuses. Bilateral cataract surgery. Other: None IMPRESSION: 8 mm thick subdural hygroma on the left with mild midline shift to the right. This could be due to acute or chronic injury. No high density blood products are present. Electronically Signed   By: Franchot Gallo M.D.   On: 05/06/2019 11:36   Dg Chest Portable 1 View  Result Date: 05/06/2019 CLINICAL DATA:  Shortness of breath EXAM: PORTABLE CHEST 1 VIEW COMPARISON:  04/29/2019 FINDINGS: Mild cardiomegaly. Aortic calcified and tortuous. Mild pulmonary vascular congestion with thickened interstitial markings. Probable small bilateral pleural effusions. No pneumothorax. IMPRESSION: Findings suggestive of CHF with interstitial edema and small bilateral pleural effusions. Electronically Signed   By: Davina Poke M.D.   On: 05/06/2019 11:16     Cardiac Studies   ECHO:  ordered  Patient Profile     85 y.o. female w/ hx  D-CHF, PAF on amio & Eliquis, HTN, DDD, EF 65-70% w/ mild AS (mean gradient 11 mmHg) by echo 11/2017 was admitted 08/31 with dizziness and bradycardia  Assessment & Plan    1. Bradycardia - pt seen by Dr Caryl Comes yesterday, he notes that HR 40s with SBP 150s should not cause dizziness and weakness. He recommends that her amio be held - orthostatic VS were negative but pt had trouble standing at all and was unable to stand for 3 minutes - will get PT eval - per Dr Caryl Comes, consider MRI and Neuro consult  2. Acute on chronic diastolic CHF - on Lasix 40 mg IV bid, continue this - will supp K+  - on home O2 3.5-4.5 L chronically, pt not sure why, says she has been on home O2 since d/c from rehab (sent there after 11/2017 admit) - currently on 4 L  Principal Problem:   Symptomatic bradycardia  Jonetta Speak , PA-C 7:54 AM 05/07/2019 Pager: [phone]  The patient was seen, examined and discussed with Rosaria Ferries, PA-C and  I agree with the above.   HRs improved now in 40-60', some blocked PACs, we wil call neuro consult per Dr Olin Pia request. She diuresed 2 L overnight but still fluid overloaded, we will continue lasix 40 mg ib BID. We will replace K. She had great difficulties to walk with PT and they are suggesting a placement to a SNF as her daughter's help is limited. We will place consult.  Ena Dawley, MD 05/07/2019

## 2019-05-07 NOTE — Progress Notes (Signed)
  Echocardiogram 2D Echocardiogram has been performed.  Burnett Kanaris 05/07/2019, 9:41 AM

## 2019-05-07 NOTE — Evaluation (Signed)
Physical Therapy Evaluation Patient Details Name: Rebecca Norman MRN: [id number] DOB: 23-Feb-1925 Today's Date: 05/07/2019   History of Present Illness  85 y.o. female w/ hx  D-CHF, PAF on amio & Eliquis, HTN, DDD, EF 65-70% w/ mild AS (mean gradient 11 mmHg) by echo 11/2017 was admitted 08/31 with dizziness and bradycardia  Clinical Impression  Pt admitted with above diagnosis. Pt was able to stand and pivot to 3N1 and recliner with mod assist to stand and incr time and min assist to pivot.  Pt with flexed posture and is a fall risk as she is unsteady on her feet.  Will need SNF.  Pt aware.   Pt currently with functional limitations due to the deficits listed below (see PT Problem List). Pt will benefit from skilled PT to increase their independence and safety with mobility to allow discharge to the venue listed below.      Follow Up Recommendations SNF;Supervision/Assistance - 24 hour    Equipment Recommendations  None recommended by PT    Recommendations for Other Services       Precautions / Restrictions Precautions Precautions: Fall Restrictions Weight Bearing Restrictions: No      Mobility  Bed Mobility Overal bed mobility: Needs Assistance Bed Mobility: Supine to Sit     Supine to sit: Min assist     General bed mobility comments: Took incr time and needed help for trunk elevation.  Also needed assist for scooting to EOB   Transfers Overall transfer level: Needs assistance Equipment used: Rolling walker (2 wheeled) Transfers: Sit to/from [facility] Sit to Stand: Mod assist Stand pivot transfers: Min assist       General transfer comment: Pt needed mod assist and incr time to stand.  Flexed postuere and needs cues to stand tall and then cannot maintain witout constant cues.  Pt stood and pivoted to 3N1 and then to recliner but takes pivotal steps very slowly and needs cues for each step as well as assist to move the RW.  Took incr time to move. Pt  needeed total assist to wipe bottom after using 3N1  Ambulation/Gait                Stairs            Wheelchair Mobility    Modified Rankin (Stroke Patients Only)       Balance Overall balance assessment: Needs assistance Sitting-balance support: No upper extremity supported;Feet supported Sitting balance-Leahy Scale: Fair     Standing balance support: Bilateral upper extremity supported;During functional activity Standing balance-Leahy Scale: Poor Standing balance comment: relies on UE support for balance.                              Pertinent Vitals/Pain Pain Assessment: No/denies pain    Home Living Family/patient expects to be discharged to:: Private residence Living Arrangements: Alone Available Help at Discharge: Family;Available PRN/intermittently(daughter) Type of Home: House Home Access: Level entry     Home Layout: One level Home Equipment: Walker - 2 wheels;Cane - single point;Bedside commode;Hand held shower head;Shower seat;Grab bars - tub/shower;Wheelchair - manual(4LO2)      Prior Function Level of Independence: Needs assistance   Gait / Transfers Assistance Needed: uses rW at all times  ADL's / Homemaking Assistance Needed: sponge bathes  Comments: daughter to help with cleaning and cooking, fell last Friday     Hand Dominance   Dominant Hand: Right  Extremity/Trunk Assessment   Upper Extremity Assessment Upper Extremity Assessment: Defer to OT evaluation    Lower Extremity Assessment Lower Extremity Assessment: Generalized weakness    Cervical / Trunk Assessment Cervical / Trunk Assessment: Kyphotic  Communication   Communication: HOH  Cognition Arousal/Alertness: Awake/alert Behavior During Therapy: WFL for tasks assessed/performed Overall Cognitive Status: Within Functional Limits for tasks assessed                                        General Comments      Exercises      Assessment/Plan    PT Assessment Patient needs continued PT services  PT Problem List Decreased activity tolerance;Decreased balance;Decreased mobility;Decreased knowledge of use of DME;Decreased safety awareness;Decreased knowledge of precautions;Decreased strength;Obesity       PT Treatment Interventions DME instruction;Gait training;Functional mobility training;Therapeutic activities;Therapeutic exercise;Balance training;Patient/family education    PT Goals (Current goals can be found in the Care Plan section)  Acute Rehab PT Goals Patient Stated Goal: to get better PT Goal Formulation: With patient Time For Goal Achievement: 05/21/19 Potential to Achieve Goals: Good    Frequency Min 2X/week   Barriers to discharge Decreased caregiver support(does not have 24 hour care)      Co-evaluation               AM-PAC PT "6 Clicks" Mobility  Outcome Measure Help needed turning from your back to your side while in a flat bed without using bedrails?: A Lot Help needed moving from lying on your back to sitting on the side of a flat bed without using bedrails?: A Lot Help needed moving to and from a bed to a chair (including a wheelchair)?: A Lot Help needed standing up from a chair using your arms (e.g., wheelchair or bedside chair)?: A Lot Help needed to walk in hospital room?: Total Help needed climbing 3-5 steps with a railing? : Total 6 Click Score: 10    End of Session Equipment Utilized During Treatment: Gait belt;Oxygen(4LO2) Activity Tolerance: Patient limited by fatigue Patient left: in chair;with call bell/phone within reach Nurse Communication: Mobility status PT Visit Diagnosis: Unsteadiness on feet (R26.81);Muscle weakness (generalized) (M62.81)    Time: [id number] PT Time Calculation (min) (ACUTE ONLY): 38 min   Charges:   PT Evaluation $PT Eval Moderate Complexity: 1 Mod PT Treatments $Therapeutic Activity: 23-37 mins        [place] Pager:  [phone]  Office:  [place] 05/07/2019, 12:00 PM

## 2019-05-08 ENCOUNTER — Inpatient Hospital Stay (HOSPITAL_COMMUNITY): Payer: Medicare Other

## 2019-05-08 DIAGNOSIS — R42 Dizziness and giddiness: Secondary | ICD-10-CM

## 2019-05-08 DIAGNOSIS — W19XXXA Unspecified fall, initial encounter: Secondary | ICD-10-CM

## 2019-05-08 DIAGNOSIS — I471 Supraventricular tachycardia: Secondary | ICD-10-CM

## 2019-05-08 DIAGNOSIS — I358 Other nonrheumatic aortic valve disorders: Secondary | ICD-10-CM

## 2019-05-08 DIAGNOSIS — I480 Paroxysmal atrial fibrillation: Secondary | ICD-10-CM

## 2019-05-08 DIAGNOSIS — W19XXXD Unspecified fall, subsequent encounter: Secondary | ICD-10-CM

## 2019-05-08 DIAGNOSIS — I639 Cerebral infarction, unspecified: Secondary | ICD-10-CM

## 2019-05-08 DIAGNOSIS — I441 Atrioventricular block, second degree: Secondary | ICD-10-CM

## 2019-05-08 DIAGNOSIS — I491 Atrial premature depolarization: Secondary | ICD-10-CM

## 2019-05-08 DIAGNOSIS — E876 Hypokalemia: Secondary | ICD-10-CM

## 2019-05-08 LAB — VITAMIN B12: Vitamin B-12: 2022 pg/mL — ABNORMAL HIGH (ref 180–914)

## 2019-05-08 LAB — APTT: aPTT: 92 seconds — ABNORMAL HIGH (ref 24–36)

## 2019-05-08 LAB — CBC
HCT: 33 % — ABNORMAL LOW (ref 36.0–46.0)
Hemoglobin: 10.6 g/dL — ABNORMAL LOW (ref 12.0–15.0)
MCH: 28.8 pg (ref 26.0–34.0)
MCHC: 32.1 g/dL (ref 30.0–36.0)
MCV: 89.7 fL (ref 80.0–100.0)
Platelets: 263 10*3/uL (ref 150–400)
RBC: 3.68 MIL/uL — ABNORMAL LOW (ref 3.87–5.11)
RDW: 13.7 % (ref 11.5–15.5)
WBC: 6.7 10*3/uL (ref 4.0–10.5)
nRBC: 0 % (ref 0.0–0.2)

## 2019-05-08 LAB — IRON AND TIBC
Iron: 56 ug/dL (ref 28–170)
Saturation Ratios: 19 % (ref 10.4–31.8)
TIBC: 294 ug/dL (ref 250–450)
UIBC: 238 ug/dL

## 2019-05-08 LAB — BASIC METABOLIC PANEL
Anion gap: 12 (ref 5–15)
BUN: 17 mg/dL (ref 8–23)
CO2: 29 mmol/L (ref 22–32)
Calcium: 8.9 mg/dL (ref 8.9–10.3)
Chloride: 95 mmol/L — ABNORMAL LOW (ref 98–111)
Creatinine, Ser: 1.09 mg/dL — ABNORMAL HIGH (ref 0.44–1.00)
GFR calc Af Amer: 51 mL/min — ABNORMAL LOW (ref >60)
GFR calc non Af Amer: 44 mL/min — ABNORMAL LOW (ref >60)
Glucose, Bld: 127 mg/dL — ABNORMAL HIGH (ref 70–99)
Potassium: 4 mmol/L (ref 3.5–5.1)
Sodium: 136 mmol/L (ref 135–145)

## 2019-05-08 LAB — FERRITIN: Ferritin: 55 ng/mL (ref 11–307)

## 2019-05-08 LAB — RETICULOCYTES
Immature Retic Fract: 14.7 % (ref 2.3–15.9)
RBC.: 3.86 MIL/uL — ABNORMAL LOW (ref 3.87–5.11)
Retic Count, Absolute: 87.6 10*3/uL (ref 19.0–186.0)
Retic Ct Pct: 2.3 % (ref 0.4–3.1)

## 2019-05-08 LAB — HEPARIN LEVEL (UNFRACTIONATED): Heparin Unfractionated: 0.89 IU/mL — ABNORMAL HIGH (ref 0.30–0.70)

## 2019-05-08 LAB — FOLATE: Folate: 10 ng/mL (ref >5.9)

## 2019-05-08 LAB — T4, FREE: Free T4: 1.1 ng/dL (ref 0.61–1.12)

## 2019-05-08 MED ORDER — STROKE: EARLY STAGES OF RECOVERY BOOK
Freq: Once | Status: AC
  Administered 2019-05-08: 20:00:00
  Filled 2019-05-08: qty 1

## 2019-05-08 MED ORDER — FUROSEMIDE 40 MG PO TABS
40.0000 mg | ORAL_TABLET | Freq: Every day | ORAL | Status: DC
  Administered 2019-05-09 – 2019-05-11 (×3): 40 mg via ORAL
  Filled 2019-05-08 (×3): qty 1

## 2019-05-08 NOTE — Progress Notes (Addendum)
Electrophysiology Rounding Note  Patient Name: Rebecca Norman Date of Encounter: 05/08/2019  Primary Cardiologist: Dr. [name] Electrophysiologist: New to Dr. Caryl Comes   Subjective   Pt reports continued feeling of "swimmy headededness" with sitting up to eat breakfast.  She has this feeling at home as well, usually when going from seated to standing.  The most exertion she gets is walking from room to room in her home with her walker, and has been so for years.    Orthostatics 05/07/2019 Lying BP 140/87   HR 53 Sitting BP 157/52  HR 53 Standing BP 143/111   HR 67  Inpatient Medications    Scheduled Meds: . levothyroxine  50 mcg Oral QODAY  . levothyroxine  75 mcg Oral QODAY  . mouth rinse  15 mL Mouth Rinse BID  . potassium chloride  40 mEq Oral Daily  . sertraline  50 mg Oral QHS  . sodium chloride flush  3 mL Intravenous Q12H  . vitamin B-12  1,000 mcg Oral Daily   Continuous Infusions: . sodium chloride    . heparin 1,250 Units/hr (05/08/19 0500)   PRN Meds: sodium chloride, acetaminophen, ALPRAZolam, nitroGLYCERIN, ondansetron (ZOFRAN) IV, sodium chloride flush, zolpidem   Vital Signs    Vitals:   05/07/19 2242 05/08/19 0600 05/08/19 0700 05/08/19 0733  BP: (!) 104/33   110/60  Pulse: 98 (!) 47  99  Resp: 18  19   Temp: 97.9 F (36.6 C)   (!) 97.5 F (36.4 C)  TempSrc: Oral   Oral  SpO2: 99%   98%  Weight:    77.1 kg  Height:        Intake/Output Summary (Last 24 hours) at 05/08/2019 0919 Last data filed at 05/08/2019 0500 Gross per 24 hour  Intake 455.43 ml  Output 500 ml  Net -44.57 ml   Filed Weights   05/06/19 1839 05/07/19 0500 05/08/19 0733  Weight: 76.4 kg 75.6 kg 77.1 kg    Physical Exam    GEN- The patient is well appearing, alert and oriented x 3 today.   Head- normocephalic, atraumatic Eyes-  Sclera clear, conjunctiva pink Ears- hearing intact Oropharynx- clear Neck- supple Lungs- Clear to ausculation bilaterally, normal work of  breathing Heart- Regular rate and rhythm, no murmurs, rubs or gallops GI- soft, NT, ND, + BS Extremities- no clubbing, cyanosis, or edema Skin- no rash or lesion Psych- euthymic mood, full affect Neuro- strength and sensation are intact  Labs    CBC Recent Labs    05/06/19 1053 05/07/19 0434 05/08/19 0443  WBC 8.1 5.9 6.7  NEUTROABS 6.7  --   --   HGB 10.7* 10.2* 10.6*  HCT 34.6* 31.4* 33.0*  MCV 93.0 90.0 89.7  PLT 279 247 [id number]   Basic Metabolic Panel Recent Labs    05/06/19 1053  05/06/19 1840 05/07/19 0434  NA 136  --   --  137  K 4.2  --   --  3.4*  CL 100  --   --  99  CO2 26  --   --  26  GLUCOSE 98  --   --  72  BUN 16  --   --  17  CREATININE 0.93   < > 0.83 0.91  CALCIUM 8.4*  --   --  8.3*  MG 1.9  --   --   --    < > = values in this interval not displayed.   Liver Function Tests Recent Labs  05/07/19 0434  AST 11*  ALT 10  ALKPHOS 62  BILITOT 0.5  PROT 5.3*  ALBUMIN 2.6*   No results for input(s): LIPASE, AMYLASE in the last 72 hours. Cardiac Enzymes No results for input(s): CKTOTAL, CKMB, CKMBINDEX, TROPONINI in the last 72 hours. BNP Invalid input(s): POCBNP D-Dimer No results for input(s): DDIMER in the last 72 hours. Hemoglobin A1C No results for input(s): HGBA1C in the last 72 hours. Fasting Lipid Panel No results for input(s): CHOL, HDL, LDLCALC, TRIG, CHOLHDL, LDLDIRECT in the last 72 hours. Thyroid Function Tests No results for input(s): TSH, T4TOTAL, T3FREE, THYROIDAB in the last 72 hours.  Invalid input(s): FREET3  Telemetry     Sinus bradycardia mostly in 40-50s with first degree AVB. Occasional ? PACs and occasional Mobitz I physiology (personally reviewed)  EKG    EKG this am shows sinus brady at 45 bpm with 1st degree AV block and mobitz 1 PR interval ~300 ms, QRS 106 ms, personally reviewed.  Radiology    Ct Head Wo Contrast  Result Date: 05/06/2019 CLINICAL DATA:  Fall.  On anticoagulation. EXAM: CT HEAD  WITHOUT CONTRAST TECHNIQUE: Contiguous axial images were obtained from the base of the skull through the vertex without intravenous contrast. COMPARISON:  None. FINDINGS: Brain: Extra-axial CSF fluid collection on the left measures 8 mm in thickness. This could be acute or chronic hygroma. No high density blood products are present. Mild midline shift to the right approximately 3 mm. Generalized atrophy. No acute infarct or hemorrhage. 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 Vascular: Negative for hyperdense vessel Skull: Negative Sinuses/Orbits: Mild mucosal edema paranasal sinuses. Bilateral cataract surgery. Other: None IMPRESSION: 8 mm thick subdural hygroma on the left with mild midline shift to the right. This could be due to acute or chronic injury. No high density blood products are present. Electronically Signed   By: Franchot Gallo M.D.   On: 05/06/2019 11:36   Dg Chest Portable 1 View  Result Date: 05/06/2019 CLINICAL DATA:  Shortness of breath EXAM: PORTABLE CHEST 1 VIEW COMPARISON:  04/29/2019 FINDINGS: Mild cardiomegaly. Aortic calcified and tortuous. Mild pulmonary vascular congestion with thickened interstitial markings. Probable small bilateral pleural effusions. No pneumothorax. IMPRESSION: Findings suggestive of CHF with interstitial edema and small bilateral pleural effusions. Electronically Signed   By: Davina Poke M.D.   On: 05/06/2019 11:16     Patient Profile     85 y.o. female with history of chronic diastolic CHF, PAF on amio & Eliquis, HTN, DDD, mild AS admitted 08/31 with dizziness and bradycardia. She has had recurrent falls over the last few years, including on 05/03/19. She had noted her HR was in the 40s so came to the ER. Dr. Caryl Comes personally reviewed her EKG which he felt represented Sinus @ 55 with interpolated PACs-conducted with suggestion of MBZ physiology, Sinus @ 55 with blocked PACs giving rise to a functional bradycardia, and telemetry with  sinus @ 50-55 or so with freq PACs-- many blocked and short runs of non conducted atrial tach. Dr. Caryl Comes did not necessarily think her bradycardia was the etiology of her dizziness and falling and recommended neurology evaluation. She has also been diuresed for presumed acute on chronic diastolic CHF. Echo updated as above.  Assessment & Plan    1. Bradycardia (in context of PAF, atrial tachycardia, blocked PACs) Dr Caryl Comes saw 05/06/2019 and stopped amiodarone. Noted that HR in 40s but SBP in 150s would be unlikely to cause dizziness  or weakness.  Pt noted to have intermittent bradycardia into the 30s, especially overnight and in the setting of blocked PACs. Dr. Meda Coffee requested second look to see if we think pt would benefit from a device. Will discuss with MD. With her co-morbidities and generalized weakness, it is very difficult to determine how contributive her bradycardia is to her functional status, and if pacing her would bring her any improvement.   2. Dizziness, weakness, and falls Neurology suspects mechanical due to chronic deconditioning, age, and generalized weakness.  Awaiting MRI to assess posterior circulation.   3. Paroxysmal atrial fibrillation CHA2DS2/VASc is at least 5 (CHF, HTN, Age, Female)  Eliquis currently on hold due to falls. Currently on heparin.  Noted to have subdural hygroma on head CT; per notes, can at times be suggestive of chronic subdural hematomas.     For questions or updates, please contact [place] Please consult [email or web address] for contact info under Cardiology/STEMI.  Signed, Shirley Friar, PA-C  05/08/2019, 9:19 AM     I have seen, examined the patient, and reviewed the above assessment and plan.  Changes to above are made where necessary.  On exam, elderly and frail. NAD, RRR.  Though the patient has sinus bradycardia and mobitz I second degree AV block, I agree with Dr Caryl Comes that this is unlikely the cause for her dizziness and  weakness.  I do not think that pacing would improve her overall quality of life.  She and discussed at length.  She is clear that she would like to avoid pacing. EP would therefore continue to advise a conservative approach at this time.  Electrophysiology team to see as needed while here. Please call with questions.   Co Sign: Thompson Grayer, MD 05/08/2019

## 2019-05-08 NOTE — Progress Notes (Addendum)
Progress Note  Patient Name: NYOMII SOWADA Date of Encounter: 05/08/2019  Primary Cardiologist: Kirk Ruths, MD  Subjective   Still feeling swimmy headed this AM. HR in the 40s currently with occasional drops to 30s but also hanging in the 50s as well.  Inpatient Medications    Scheduled Meds: . furosemide  40 mg Intravenous BID  . levothyroxine  50 mcg Oral QODAY  . levothyroxine  75 mcg Oral QODAY  . mouth rinse  15 mL Mouth Rinse BID  . potassium chloride  40 mEq Oral Daily  . sertraline  50 mg Oral QHS  . sodium chloride flush  3 mL Intravenous Q12H  . vitamin B-12  1,000 mcg Oral Daily   Continuous Infusions: . sodium chloride    . heparin 1,250 Units/hr (05/08/19 0500)   PRN Meds: sodium chloride, acetaminophen, ALPRAZolam, nitroGLYCERIN, ondansetron (ZOFRAN) IV, sodium chloride flush, zolpidem   Vital Signs    Vitals:   05/07/19 2242 05/08/19 0600 05/08/19 0700 05/08/19 0733  BP: (!) 104/33   110/60  Pulse: 98 (!) 47  99  Resp: 18  19   Temp: 97.9 F (36.6 C)   (!) 97.5 F (36.4 C)  TempSrc: Oral   Oral  SpO2: 99%   98%  Weight:    77.1 kg  Height:        Intake/Output Summary (Last 24 hours) at 05/08/2019 0756 Last data filed at 05/08/2019 0500 Gross per 24 hour  Intake 455.43 ml  Output 500 ml  Net -44.57 ml   Last 3 Weights 05/08/2019 05/07/2019 05/06/2019  Weight (lbs) 169 lb 15.6 oz 166 lb 11.2 oz 168 lb 6.9 oz  Weight (kg) 77.1 kg 75.615 kg 76.4 kg     Telemetry    Sinus bradycardia with first degree AVB, occasional PACs, brief run of nonconducted atrial tach, also Mobitz type 1 - Personally Reviewed  Physical Exam   GEN: No acute distress, elderly, frail appearing HEENT: Normocephalic, atraumatic, sclera non-icteric. Neck: No JVD or bruits. Cardiac: Irregular HR, minimal SEM, no rubs or gallops.  Radials/DP/PT 1+ and equal bilaterally.  Respiratory: Decreased BS as bases, Breathing is unlabored. GI: Soft, nontender, non-distended, BS  +x 4. MS: no deformity. Extremities: No clubbing or cyanosis. No edema, thickened skin on legs. Distal pedal pulses are 2+ and equal bilaterally. Neuro:  AAOx3. Follows commands. Psych:  Responds to questions appropriately with a normal affect.  Labs    High Sensitivity Troponin:   Recent Labs  Lab 05/06/19 1053 05/06/19 1629 05/06/19 1840  TROPONINIHS 85* 106* 105*      Cardiac EnzymesNo results for input(s): TROPONINI in the last 168 hours. No results for input(s): TROPIPOC in the last 168 hours.   Chemistry Recent Labs  Lab 05/06/19 1053 05/06/19 1629 05/06/19 1840 05/07/19 0434  NA 136  --   --  137  K 4.2  --   --  3.4*  CL 100  --   --  99  CO2 26  --   --  26  GLUCOSE 98  --   --  72  BUN 16  --   --  17  CREATININE 0.93 0.95 0.83 0.91  CALCIUM 8.4*  --   --  8.3*  PROT  --   --   --  5.3*  ALBUMIN  --   --   --  2.6*  AST  --   --   --  11*  ALT  --   --   --  10  ALKPHOS  --   --   --  55  BILITOT  --   --   --  0.5  GFRNONAA 54* 52* >60 55*  GFRAA >60 >60 >60 >60  ANIONGAP 10  --   --  12     Hematology Recent Labs  Lab 05/06/19 1053 05/07/19 0434 05/08/19 0443  WBC 8.1 5.9 6.7  RBC 3.72* 3.49* 3.68*  HGB 10.7* 10.2* 10.6*  HCT 34.6* 31.4* 33.0*  MCV 93.0 90.0 89.7  MCH 28.8 29.2 28.8  MCHC 30.9 32.5 32.1  RDW 13.7 13.7 13.7  PLT 279 247 263    BNP Recent Labs  Lab 05/06/19 1053  BNP 1,032.4*     DDimer No results for input(s): DDIMER in the last 168 hours.   Radiology    Ct Head Wo Contrast  Result Date: 05/06/2019 CLINICAL DATA:  Fall.  On anticoagulation. EXAM: CT HEAD WITHOUT CONTRAST TECHNIQUE: Contiguous axial images were obtained from the base of the skull through the vertex without intravenous contrast. COMPARISON:  None. FINDINGS: Brain: Extra-axial CSF fluid collection on the left measures 8 mm in thickness. This could be acute or chronic hygroma. No high density blood products are present. Mild midline shift to the right  approximately 3 mm. Generalized atrophy. No acute infarct or hemorrhage. 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 Vascular: Negative for hyperdense vessel Skull: Negative Sinuses/Orbits: Mild mucosal edema paranasal sinuses. Bilateral cataract surgery. Other: None IMPRESSION: 8 mm thick subdural hygroma on the left with mild midline shift to the right. This could be due to acute or chronic injury. No high density blood products are present. Electronically Signed   By: Franchot Gallo M.D.   On: 05/06/2019 11:36   Dg Chest Portable 1 View  Result Date: 05/06/2019 CLINICAL DATA:  Shortness of breath EXAM: PORTABLE CHEST 1 VIEW COMPARISON:  04/29/2019 FINDINGS: Mild cardiomegaly. Aortic calcified and tortuous. Mild pulmonary vascular congestion with thickened interstitial markings. Probable small bilateral pleural effusions. No pneumothorax. IMPRESSION: Findings suggestive of CHF with interstitial edema and small bilateral pleural effusions. Electronically Signed   By: Davina Poke M.D.   On: 05/06/2019 11:16    Cardiac Studies   2D echo 05/07/19 IMPRESSIONS    1. The left ventricle has hyperdynamic systolic function, with an ejection fraction of >65%. The cavity size was normal. There is mild concentric left ventricular hypertrophy. Left ventricular diastolic Doppler parameters are consistent with  pseudonormalization. Elevated mean left atrial pressure No evidence of left ventricular regional wall motion abnormalities.  2. The right ventricle has normal systolic function. The cavity was normal. There is no increase in right ventricular wall thickness. Right ventricular systolic pressure is mildly elevated with an estimated pressure of 45.5 mmHg.  3. Left atrial size was severely dilated.  4. The mitral valve is degenerative. There is moderate mitral annular calcification present. Mitral valve regurgitation is moderate by color flow Doppler. The MR jet is centrally-directed. No  evidence of mitral valve stenosis.  5. The tricuspid valve is grossly normal.  6. The aortic valve is tricuspid. Mild thickening of the aortic valve. Sclerosis without any evidence of stenosis of the aortic valve. Aortic valve regurgitation is mild by color flow Doppler.  7. The aorta is normal unless otherwise noted.  Patient Profile     85 y.o. female with history of chronic diastolic CHF, PAF on amio & Eliquis, HTN, DDD, mild AS admitted 08/31 with dizziness and bradycardia. She  has had recurrent falls over the last few years, including on 05/03/19. She had noted her HR was in the 40s so came to the ER. Dr. Caryl Comes personally reviewed her EKG which he felt represented Sinus @ 55 with interpolated PACs-conducted with suggestion of MBZ physiology, Sinus @ 55 with blocked PACs giving rise to a functional bradycardia, and telemetry with  sinus @ 50-55 or so with freq PACs-- many blocked and short runs of non conducted atrial tach. Dr. Caryl Comes did not necessarily think her bradycardia was the etiology of her dizziness and falling and recommended neurology evaluation. She has also been diuresed for presumed acute on chronic diastolic CHF. Echo updated as above.   Assessment & Plan    1. Dizziness, weakness, and falling - see #2. Neurology has been involved and feels falls are likely mechanical due to chronic deconditioning, age and generalized weakness, possibly made worse with current medical conditions such as acute on chronic CHF, bradycardia. MRI of the brain to exclude posterior circulation issue is pending.  2. Bradycardia (in context of hx of PAF, atrial tachycardia, blocked PACs)- per notes, pt seen by Dr Caryl Comes this admission, he notes that HR 40s with SBP 150s should not cause dizziness and weakness, although patient also intermittently dropping into the 30s. He recommended that her amiodarone be held. Orthostatics were negative but patient had trouble standing with balance, so PT has been involved  suggesting need for SNF. Remains on heparin drip - await input from MD about final decision about resuming oral Eliquis (vs remaining off due to fall risk) - has subdural hygroma on CT as well (some subdural hygromas are believed to be derived from chronic subdural hematomas). Per d/w Dr. Meda Coffee, will ask for EP to see again today given continued episodic HR dropping into 30s-40s.  3. Acute on chronic diastolic CHF - down -1L, weights inconsistent in Epic. Can likely change to oral diuretic, will review with Dr. Meda Coffee who has been following patient this week. BP has been trending towards the softer side, so will go ahead and discontinue Lasix (already got today's dose).  4. History of mild AS - only sclerosis noted on echo, not clinically significant at this time. No other significant valvular stenosis or leakage.  5. Elevated hsTroponin - flat trend, unlikely related to acute issues. Recommend conservative management given lack of angina and preserved LV function.  6. Hypokalemia - check K with labs this AM, no BMET ordered. Recently had hyperkalemia as OP so exercise caution.  7. Chronic appearing anemia - generally in the 10-11 range, although was 12-13 briefly in 03/2018. No bleeding reported. Will get anemia panel. Will need to f/u PCP for this.   8. Hypothyroidism - recent TSH elevated, will add free T4. ? Whether this could be compounding issues. If FT4 is low may need levothyroxine adjustment.  Dispo: SNF recommended, social work has launched bed search but unclear if patient will agree to going.  For questions or updates, please contact [facility] Please consult [email or web address] for contact info under Cardiology/STEMI.  Signed, Charlie Pitter, PA-C 05/08/2019, 7:56 AM    The patient was seen, examined and discussed with Melina Copa, PA-C and I agree with the above.   HRs improved now in 40-60', with some drops to 30', some blocked PACs, neuro awaiting MRI showed no acute changes. EP saw  but doesn't believe that she is a candidate for a PM implantation given her age and character of her symptoms. She appears euvolemic, I  would switch to lasix 40 mg po daily. Continue physical therapy,plan for a transfer to SNF or a rehab if no new abnormalities on MRI, we will consider discontinuation of Eliquis based on MRI results (CT showed hygroma = result of a prior fall and SDH)  Ena Dawley, MD 05/08/2019

## 2019-05-08 NOTE — Progress Notes (Addendum)
NEUROLOGY PROGRESS NOTE  Subjective: At this point patient has no complaints about dizziness however, after returning from MRI apparently the pure wick fell out and she is very concerned about getting cleaned up.  Exam: Vitals:   05/08/19 0733 05/08/19 1408  BP: 110/60 (!) 145/58  Pulse: 99 60  Resp:  20  Temp: (!) 97.5 F (36.4 C)   SpO2: 98% 100%    Physical Exam   HEENT-  Normocephalic, no lesions, without obvious abnormality.  Normal external eye and conjunctiva.   Extremities- Warm, dry and intact Musculoskeletal-no joint tenderness, deformity or swelling Skin-warm and dry, no hyperpigmentation, vitiligo, or suspicious lesions    Neuro:  Mental Status: Alert, oriented, thought content appropriate.  Speech fluent without evidence of aphasia.  Able to follow 3 step commands without difficulty. Cranial Nerves: II:  Visual fields grossly normal,  III,IV, VI: ptosis not present, extra-ocular motions intact bilaterally pupils equal, round, reactive to light and accommodation V,VII: smile symmetric, facial light touch sensation normal bilaterally VIII: hearing normal bilaterally IX,X: Palate rises midline XI: bilateral shoulder shrug XII: midline tongue extension Motor: Right : Upper extremity   5/5    Left:     Upper extremity   5/5  Lower extremity   5/5     Lower extremity   5/5 Tone and bulk:normal tone throughout; no atrophy noted Sensory: Decreased sensation to light touch and cold temperature up to midcalf Deep Tendon Reflexes: 2+ and symmetric throughout upper extremities Plantars: Right: downgoing   Left: downgoing Cerebellar: normal finger-to-nose, normal rapid alternating movements and normal heel-to-shin test Gait: Shuffled steps, fenestrated turn, tends to lean backwards when walking and drift slightly to the right.    Medications:  Scheduled: . [START ON 05/09/2019] furosemide  40 mg Oral Daily  . levothyroxine  50 mcg Oral QODAY  . levothyroxine  75 mcg  Oral QODAY  . mouth rinse  15 mL Mouth Rinse BID  . potassium chloride  40 mEq Oral Daily  . sertraline  50 mg Oral QHS  . sodium chloride flush  3 mL Intravenous Q12H  . vitamin B-12  1,000 mcg Oral Daily   Continuous: . 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 Fluid is predominately CSF however there are scattered areas of hemorrhage which could be subacute or chronic within the subdural fluid. Mild midline shift to the right. Electronically Signed   By: Franchot Gallo M.D.   On: 05/08/2019 15:39   Echocardiogram shows left ventricular has hyperdynamic systolic function, with ejection fraction greater than 65%.  The cavity size was normal.  Assessment: 85 year old female presenting to the hospital secondary to feeling off balance and feeling weakness in her legs.  Post MRI it reveals that she does have acute infarcts in the right pons, right cerebellum, left putamen and the right convexity.  These strokes are in bilateral aspects of the brain thus likely cardioembolic.  We do know that she has symptomatic bradycardia.  Recommend #MRA Head and neck  #Start or continue Atorvastatin 80 mg/other high intensity statin # BP goal: Normotensive blood pressure # HBAIC and Lipid profile # Telemetry monitoring # Frequent neuro checks # NPO until passes stroke swallow screen # please page stroke NP  Or  PA  Or MD from 8am -4 pm  as  this patient from this time will be  followed by the stroke.   You can look them up on [email or web address]  Password [email or web address]  Etta Quill PA-C [facility] [phone]  I have seen the patient reviewed the above note.  She has a 2-week history of difficulty walking.  She does have a fluid collection which had the concern raised for possible acute hemorrhage  on MRI, will hold heparin for now and repeat CT in the morning.  I suspect that this is likely cardioembolic disease secondary to her atrial fibrillation.  I do wonder if she missed some doses of Eliquis, but in any case I am not certain that I would favor changing.  Stroke team to follow  05/08/2019, 4:25 PM

## 2019-05-08 NOTE — Progress Notes (Addendum)
MRI resulted, showed:  IMPRESSION: Acute infarcts in the right pons, right cerebellum, left putamen, and right convexity cortex  7 mm thick left hemispheric fluid collection. Fluid is predominately CSF however there are scattered areas of hemorrhage which could be subacute or chronic within the subdural fluid. Mild midline shift to the right.  Discussed with neurology - likely now have explanation for symptoms and they will help [place] work up. Given the acute stroke and areas of hemorrhage it was recommended to discontinue anticoagulation acute so have discontinued heparin. Gave nurse update and also spoke with patient as well. Appreciate neuro's assistance.  Addendum: Spoke with Dr. Lorin Mercy with hospitalist team. Given that patient will still require post-stroke care but no further cardiac workup needed at this time, it's appropriate at this time to transfer to IM service - she is agreeable and will pick up patient in AM. Appreciate their help!  Dayna Dunn PA-C

## 2019-05-08 NOTE — Progress Notes (Signed)
[place] for Heparin Indication: atrial fibrillation  Allergies  Allergen Reactions  . Flagyl [Metronidazole] Hives  . Lisinopril Cough    Patient Measurements: Height: 5\' 3"  (160 cm) Weight: 169 lb 15.6 oz (77.1 kg) IBW/kg (Calculated) : 52.4  Heparin dosing weight: 69.9 kg   Vital Signs: Temp: 97.5 F (36.4 C) (09/02 0733) Temp Source: Oral (09/02 0733) BP: 110/60 (09/02 0733) Pulse Rate: 99 (09/02 0733)  Labs: Recent Labs    05/06/19 1053 05/06/19 1629 05/06/19 1840  05/07/19 0121 05/07/19 0434 05/07/19 2030 05/08/19 0443 05/08/19 0708  HGB 10.7*  --   --   --   --  10.2*  --  10.6*  --   HCT 34.6*  --   --   --   --  31.4*  --  33.0*  --   PLT 279  --   --   --   --  247  --  263  --   APTT  --   --   --    < > 68* 46* 51*  --  92*  HEPARINUNFRC  --   --   --   --  1.44*  --   --   --  0.89*  CREATININE 0.93 0.95 0.83  --   --  0.91  --   --   --   TROPONINIHS 85* 106* 105*  --   --   --   --   --   --    < > = values in this interval not displayed.    Estimated Creatinine Clearance: 39.6 mL/min (by C-G formula based on SCr of 0.91 mg/dL).   Assessment: 33 yoF admitted for symptomatic bradycardia. On Eliquis 5 mg BID PTA for Afib, last dose 8/30 @2100 . Pharmacy consulted to dose heparin.  PTT therapeutic this morning   Goal of Therapy:  Heparin level 0.3-0.7 units/ml aPTT 66-102 seconds Monitor platelets by anticoagulation protocol: Yes   Plan:  Continue heparin at 1250 units / hr Monitor daily heparin level, aPTT, CBC, and s/sx of bleeding  Thank you Anette Guarneri, PharmD [phone]

## 2019-05-09 ENCOUNTER — Inpatient Hospital Stay (HOSPITAL_COMMUNITY): Payer: Medicare Other

## 2019-05-09 DIAGNOSIS — D649 Anemia, unspecified: Secondary | ICD-10-CM

## 2019-05-09 DIAGNOSIS — E034 Atrophy of thyroid (acquired): Secondary | ICD-10-CM

## 2019-05-09 DIAGNOSIS — I639 Cerebral infarction, unspecified: Secondary | ICD-10-CM | POA: Diagnosis present

## 2019-05-09 DIAGNOSIS — Z8673 Personal history of transient ischemic attack (TIA), and cerebral infarction without residual deficits: Secondary | ICD-10-CM | POA: Diagnosis present

## 2019-05-09 LAB — LIPID PANEL
Cholesterol: 127 mg/dL (ref 0–200)
HDL: 40 mg/dL — ABNORMAL LOW (ref >40)
LDL Cholesterol: 65 mg/dL (ref 0–99)
Total CHOL/HDL Ratio: 3.2 RATIO
Triglycerides: 110 mg/dL (ref <150)
VLDL: 22 mg/dL (ref 0–40)

## 2019-05-09 LAB — BASIC METABOLIC PANEL
Anion gap: 13 (ref 5–15)
BUN: 26 mg/dL — ABNORMAL HIGH (ref 8–23)
CO2: 26 mmol/L (ref 22–32)
Calcium: 8.7 mg/dL — ABNORMAL LOW (ref 8.9–10.3)
Chloride: 97 mmol/L — ABNORMAL LOW (ref 98–111)
Creatinine, Ser: 1.03 mg/dL — ABNORMAL HIGH (ref 0.44–1.00)
GFR calc Af Amer: 55 mL/min — ABNORMAL LOW (ref >60)
GFR calc non Af Amer: 47 mL/min — ABNORMAL LOW (ref >60)
Glucose, Bld: 85 mg/dL (ref 70–99)
Potassium: 4.6 mmol/L (ref 3.5–5.1)
Sodium: 136 mmol/L (ref 135–145)

## 2019-05-09 LAB — CBC
HCT: 33.9 % — ABNORMAL LOW (ref 36.0–46.0)
Hemoglobin: 10.7 g/dL — ABNORMAL LOW (ref 12.0–15.0)
MCH: 29 pg (ref 26.0–34.0)
MCHC: 31.6 g/dL (ref 30.0–36.0)
MCV: 91.9 fL (ref 80.0–100.0)
Platelets: 249 10*3/uL (ref 150–400)
RBC: 3.69 MIL/uL — ABNORMAL LOW (ref 3.87–5.11)
RDW: 13.5 % (ref 11.5–15.5)
WBC: 5.9 10*3/uL (ref 4.0–10.5)
nRBC: 0 % (ref 0.0–0.2)

## 2019-05-09 LAB — HEMOGLOBIN A1C
Hgb A1c MFr Bld: 5.6 % (ref 4.8–5.6)
Mean Plasma Glucose: 114.02 mg/dL

## 2019-05-09 LAB — GLUCOSE, CAPILLARY
Glucose-Capillary: 85 mg/dL (ref 70–99)
Glucose-Capillary: 107 mg/dL — ABNORMAL HIGH (ref 70–99)

## 2019-05-09 LAB — APTT: aPTT: 29 seconds (ref 24–36)

## 2019-05-09 MED ORDER — APIXABAN 5 MG PO TABS
5.0000 mg | ORAL_TABLET | Freq: Two times a day (BID) | ORAL | Status: DC
  Administered 2019-05-09 – 2019-05-11 (×5): 5 mg via ORAL
  Filled 2019-05-09 (×5): qty 1

## 2019-05-09 MED ORDER — ATORVASTATIN CALCIUM 40 MG PO TABS
40.0000 mg | ORAL_TABLET | Freq: Every day | ORAL | Status: DC
  Administered 2019-05-09 – 2019-05-10 (×2): 40 mg via ORAL
  Filled 2019-05-09 (×2): qty 1

## 2019-05-09 NOTE — Progress Notes (Signed)
Physical Therapy Treatment Patient Details Name: Rebecca Norman MRN: [id number] DOB: [birth year] Today's Date: 05/09/2019    History of Present Illness 85 y.o. female w/ hx  D-CHF, PAF on amio & Eliquis, HTN, DDD, EF 65-70% w/ mild AS (mean gradient 11 mmHg) by echo 11/2017 was admitted 08/31 with dizziness and bradycardia. MRI reveals acute infarcts in the right pons, right cerebellum, left putamen and the right convexity 7 mm thick left hemispheric fluid collection combination of CSF and hemmoragic origin.     PT Comments    Pt sitting up at EoB finishing breakfast on entry agreeable to therapy. MRI results revealed acute infarcts which were likely the source of the dizziness and weakness contributing to her fall on her L side prior to hospitalization. No evidence of continued weakness or sensation change during session. Pt requires min A for transfers and ambulation of 15 feet with RW. However, pt requires 3x standing rest breaks with ambulation and is visibly fatigued at end of walk. PT continues to recommend SNF level rehab at discharge to improve strength and endurance prior to going home. Pt reports going to [facility] and [facility] during prior SNF stays and reports she definitely does not want to go back to either of them. PT will continue to follow acutely.    Follow Up Recommendations  SNF;Supervision/Assistance - 24 hour     Equipment Recommendations  None recommended by PT       Precautions / Restrictions Precautions Precautions: Fall Restrictions Weight Bearing Restrictions: No    Mobility  Bed Mobility               General bed mobility comments: sitting EOB at start of session. Per nursing, pt with increased time and effort to get to EOB  Transfers Overall transfer level: Needs assistance Equipment used: Rolling walker (2 wheeled) Transfers: Sit to/from Stand Sit to Stand: Min assist;+2 physical assistance         General transfer comment: assist to  steady and power up. Cues for hand placement. Increased time and effort. Cues to fully reach chair prior to sitting down. assist to control descent  Ambulation/Gait Ambulation/Gait assistance: Min assist Gait Distance (Feet): 15 Feet Assistive device: Rolling walker (2 wheeled) Gait Pattern/deviations: Step-through pattern;Decreased step length - right;Decreased step length - left;Shuffle;Trunk flexed Gait velocity: slowed Gait velocity interpretation: <1.31 ft/sec, indicative of household ambulator General Gait Details: minA for steadying with slow, shuffling gait from bed to door, pt requires 3x standing rest breaks during ambulation and is visibly fatigued when sitting back in recliner following behind her       Balance Overall balance assessment: Needs assistance Sitting-balance support: No upper extremity supported;Feet supported Sitting balance-Leahy Scale: Fair Sitting balance - Comments: received EOB and sat several minutes with therapy and MD present   Standing balance support: Bilateral upper extremity supported;During functional activity Standing balance-Leahy Scale: Poor Standing balance comment: relies on UE support for balance.                             Cognition Arousal/Alertness: Awake/alert Behavior During Therapy: WFL for tasks assessed/performed Overall Cognitive Status: Within Functional Limits for tasks assessed                                           General Comments General comments (skin  integrity, edema, etc.): Pt reporting feeling "ok" while OOB. Utilized 6L O2 to ambulate, returned to 4.5 at end of session SaO2 >90%O2 throughout      Pertinent Vitals/Pain Pain Assessment: Faces Faces Pain Scale: Hurts little more Pain Location: neck, back, shoulders Pain Descriptors / Indicators: Aching;Sore Pain Intervention(s): Limited activity within patient's tolerance;Monitored during session;Repositioned    Home Living  Family/patient expects to be discharged to:: Private residence Living Arrangements: Alone Available Help at Discharge: Family;Available PRN/intermittently Type of Home: House Home Access: Level entry   Home Layout: One level Home Equipment: Walker - 2 wheels;Cane - single point;Bedside commode;Hand held shower head;Shower seat;Grab bars - tub/shower;Wheelchair - manual      Prior Function Level of Independence: Needs assistance  Gait / Transfers Assistance Needed: uses rW at all times ADL's / Homemaking Assistance Needed: sponge bathes Comments: daughter to help with cleaning and cooking, fell last Friday   PT Goals (current goals can now be found in the care plan section) Acute Rehab PT Goals Patient Stated Goal: reluctantly agreeable to SNF, agrees she can't go home alone at this level PT Goal Formulation: With patient Time For Goal Achievement: 05/21/19 Potential to Achieve Goals: Good Progress towards PT goals: Progressing toward goals    Frequency    Min 2X/week      PT Plan Current plan remains appropriate    Co-evaluation PT/OT/SLP Co-Evaluation/Treatment: Yes Reason for Co-Treatment: For patient/therapist safety PT goals addressed during session: Mobility/safety with mobility;Balance;Proper use of DME OT goals addressed during session: ADL's and self-care;Proper use of Adaptive equipment and DME      AM-PAC PT "6 Clicks" Mobility   Outcome Measure  Help needed turning from your back to your side while in a flat bed without using bedrails?: A Little Help needed moving from lying on your back to sitting on the side of a flat bed without using bedrails?: A Little Help needed moving to and from a bed to a chair (including a wheelchair)?: A Little Help needed standing up from a chair using your arms (e.g., wheelchair or bedside chair)?: A Little Help needed to walk in hospital room?: A Little Help needed climbing 3-5 steps with a railing? : Total 6 Click Score:  16    End of Session Equipment Utilized During Treatment: Gait belt;Oxygen Activity Tolerance: Patient limited by fatigue Patient left: in chair;with call bell/phone within reach Nurse Communication: Mobility status PT Visit Diagnosis: Unsteadiness on feet (R26.81);Muscle weakness (generalized) (M62.81)     Time: [id number] PT Time Calculation (min) (ACUTE ONLY): 40 min  Charges:  $Gait Training: 8-22 mins                     Neythan Kozlov B. Migdalia Dk PT, DPT Acute Rehabilitation Services Pager [phone] Office [phone]    [place] 05/09/2019, 1:43 PM

## 2019-05-09 NOTE — Care Management Important Message (Signed)
Important Message  Patient Details  Name: Rebecca Norman MRN: [id number] Date of Birth: 1924-10-30   Medicare Important Message Given:  Yes Gave the IM Letter to Nurse Purcell.was bathing.     [name] 05/09/2019, 11:42 AM

## 2019-05-09 NOTE — Evaluation (Signed)
Occupational Therapy Evaluation Patient Details Name: Rebecca Norman MRN: [id number] DOB: 05-Oct-1924 Today's Date: 05/09/2019    History of Present Illness 85 y.o. female w/ hx  D-CHF, PAF on amio & Eliquis, HTN, DDD, EF 65-70% w/ mild AS (mean gradient 11 mmHg) by echo 11/2017 was admitted 08/31 with dizziness and bradycardia. MRI reveals acute infarcts in the right pons, right cerebellum, left putamen and the right convexity 7 mm thick left hemispheric fluid collection combination of CSF and hemmoragic origin.    Clinical Impression   Pt admitted with the above diagnoses and presents with below problem list. Pt will benefit from continued acute OT to address the below listed deficits and maximize independence with basic ADLs prior to d/c to venue below. PTA pt was mod I with ADLs, sponge bathes in lieu of shower transfer, assist from family with IADLs. Pt presents with generalized weakness, decreased activity tolerance, and decreased balance. Pt is currently min A with UB ADLs, mod A for LB ADLs, +2 min A for functional transfers, min A +2 for safety (chair follow) for short distance ambulation in the room (to the door). Pt has experienced a decline in functional status with ADLs, needing more assist that her baseline. Recommending ST SNF stay prior to, hopefully, returning home.      Follow Up Recommendations  SNF    Equipment Recommendations  Other (comment)(defer to next venue)    Recommendations for Other Services       Precautions / Restrictions Precautions Precautions: Fall Restrictions Weight Bearing Restrictions: No      Mobility Bed Mobility               General bed mobility comments: sitting EOB at start of session. Per nursing, pt with increased time and effort to get to EOB  Transfers Overall transfer level: Needs assistance Equipment used: Rolling walker (2 wheeled) Transfers: Sit to/from Stand Sit to Stand: Min assist;+2 physical assistance          General transfer comment: assist to steady and power up. Cues for hand placement. Increased time and effort. Cues to fully reach chair prior to sitting down. assist to control descent    Balance Overall balance assessment: Needs assistance Sitting-balance support: No upper extremity supported;Feet supported Sitting balance-Leahy Scale: Fair Sitting balance - Comments: received EOB and sat several minutes with therapy and MD present   Standing balance support: Bilateral upper extremity supported;During functional activity Standing balance-Leahy Scale: Poor Standing balance comment: relies on UE support for balance.                            ADL either performed or assessed with clinical judgement   ADL Overall ADL's : Needs assistance/impaired Eating/Feeding: Set up;Sitting   Grooming: Set up;Sitting   Upper Body Bathing: Minimal assistance;Sitting   Lower Body Bathing: Moderate assistance;Sit to/from stand   Upper Body Dressing : Set up;Minimal assistance;Sitting   Lower Body Dressing: Moderate assistance;Sit to/from stand   Toilet Transfer: Minimal assistance;+2 for safety/equipment;Ambulation;Comfort height toilet;RW   Toileting- Clothing Manipulation and Hygiene: Moderate assistance;Sit to/from stand Toileting - Clothing Manipulation Details (indicate cue type and reason): Pt stood with rw, therapist completed pericare. Difficulty with dynamic tasks in standing position   Tub/Shower Transfer Details (indicate cue type and reason): sponge bathes at baseline Functional mobility during ADLs: Minimal assistance;+2 for safety/equipment;Rolling walker(chair follow) General ADL Comments: Pt sat EOB several minutes. walked length of room, chair utilized to  return pt to center of room.     Vision Baseline Vision/History: Wears glasses Wears Glasses: At all times Patient Visual Report: ("the lines seem to move when I'm reading") Vision Assessment?: Vision impaired- to  be further tested in functional context     Perception     Praxis      Pertinent Vitals/Pain Pain Assessment: Faces Faces Pain Scale: Hurts little more Pain Location: neck, back, shoulders Pain Descriptors / Indicators: Aching;Sore Pain Intervention(s): Limited activity within patient's tolerance;Monitored during session;Repositioned     Hand Dominance Right   Extremity/Trunk Assessment Upper Extremity Assessment Upper Extremity Assessment: Generalized weakness;LUE deficits/detail LUE Deficits / Details: limited ROM at shoulder level due to pain "I fell on it last week"  LUE: Unable to fully assess due to pain LUE Coordination: WNL   Lower Extremity Assessment Lower Extremity Assessment: Defer to PT evaluation;Generalized weakness   Cervical / Trunk Assessment Cervical / Trunk Assessment: Kyphotic   Communication Communication Communication: HOH   Cognition Arousal/Alertness: Awake/alert Behavior During Therapy: WFL for tasks assessed/performed Overall Cognitive Status: Within Functional Limits for tasks assessed                                     General Comments  Pt reporting feeling "ok" while OOB. Utilized 6L O2 to ambulate, returned to 4.5 at end of session    Exercises     Shoulder Instructions      Home Living Family/patient expects to be discharged to:: Private residence Living Arrangements: Alone Available Help at Discharge: Family;Available PRN/intermittently Type of Home: House Home Access: Level entry     Home Layout: One level     Bathroom Shower/Tub: Occupational psychologist: Handicapped height     Home Equipment: Environmental consultant - 2 wheels;Cane - single point;Bedside commode;Hand held shower head;Shower seat;Grab bars - tub/shower;Wheelchair - manual          Prior Functioning/Environment Level of Independence: Needs assistance  Gait / Transfers Assistance Needed: uses rW at all times ADL's / Homemaking Assistance  Needed: sponge bathes   Comments: daughter to help with cleaning and cooking, fell last Friday        OT Problem List: Impaired balance (sitting and/or standing);Decreased activity tolerance;Decreased knowledge of use of DME or AE;Decreased knowledge of precautions;Cardiopulmonary status limiting activity;Pain      OT Treatment/Interventions: Self-care/ADL training;Therapeutic exercise;Energy conservation;DME and/or AE instruction;Therapeutic activities;Patient/family education;Balance training    OT Goals(Current goals can be found in the care plan section) Acute Rehab OT Goals Patient Stated Goal: reluctantly agreeable to SNF, agrees she can't go home alone at this level OT Goal Formulation: With patient Time For Goal Achievement: 05/23/19 Potential to Achieve Goals: Good ADL Goals Pt Will Perform Upper Body Bathing: sitting;with set-up Pt Will Perform Lower Body Bathing: sit to/from stand;with min assist Pt Will Perform Upper Body Dressing: (P) with set-up;sitting Pt Will Perform Lower Body Dressing: with min assist;sit to/from stand Pt Will Transfer to Toilet: with min assist;ambulating Pt Will Perform Toileting - Clothing Manipulation and hygiene: with min assist;sit to/from stand  OT Frequency: Min 2X/week   Barriers to D/C:            Co-evaluation PT/OT/SLP Co-Evaluation/Treatment: Yes Reason for Co-Treatment: For patient/therapist safety;To address functional/ADL transfers   OT goals addressed during session: ADL's and self-care;Proper use of Adaptive equipment and DME      AM-PAC OT "6 Clicks" Daily  Activity     Outcome Measure Help from another person eating meals?: A Little Help from another person taking care of personal grooming?: A Lot Help from another person toileting, which includes using toliet, bedpan, or urinal?: A Lot Help from another person bathing (including washing, rinsing, drying)?: A Lot Help from another person to put on and taking off regular  upper body clothing?: A Little Help from another person to put on and taking off regular lower body clothing?: A Lot 6 Click Score: 14   End of Session Equipment Utilized During Treatment: Rolling walker;Oxygen;Gait belt Nurse Communication: Mobility status;Other (comment)(notified NT of need for clean up- urinary incontinence)  Activity Tolerance: Patient limited by fatigue;Patient tolerated treatment well Patient left: in chair;with call bell/phone within reach;with nursing/sitter in room  OT Visit Diagnosis: Unsteadiness on feet (R26.81);Muscle weakness (generalized) (M62.81);History of falling (Z91.81);Pain Pain - Right/Left: Left Pain - part of body: Shoulder                Time: [id number] OT Time Calculation (min): 43 min Charges:  OT General Charges $OT Visit: 1 Visit OT Evaluation $OT Eval Low Complexity: 1 Low OT Treatments $Self Care/Home Management : 8-22 mins  Tyrone Schimke, OT Acute Rehabilitation Services Pager: [phone] Office: [phone]   Hortencia Pilar 05/09/2019, 12:13 PM

## 2019-05-09 NOTE — Evaluation (Signed)
Speech Language Pathology Evaluation Patient Details Name: Rebecca Norman MRN: [id number] DOB: 1925/07/08 Today's Date: 05/09/2019 Time: [id number] SLP Time Calculation (min) (ACUTE ONLY): 16 min  Problem List:  Patient Active Problem List   Diagnosis Date Noted  . Acute embolic stroke ([place]) [id number]  . Dizziness 05/08/2019  . Falls 05/08/2019  . Hypokalemia 05/08/2019  . Aortic valve sclerosis 05/08/2019  . PAT (paroxysmal atrial tachycardia) ([place]) 05/08/2019  . Blocked premature atrial contraction 05/08/2019  . Bradycardia 05/06/2019  . Coagulation disorder ([place]) 04/10/2019  . Acute on chronic respiratory failure with hypoxia ([place]) 03/25/2018  . Leukocytosis 03/25/2018  . Acute on chronic diastolic CHF (congestive heart failure) ([place]) 03/25/2018  . B12 deficiency 02/18/2018  . Acute lower UTI 02/07/2018  . Acute on chronic diastolic (congestive) heart failure ([place]) 11/21/2017  . Paroxysmal atrial fibrillation ([place]) 11/21/2017  . Cardiorenal syndrome with renal failure 11/15/2017  . Anemia, unspecified 11/12/2017  . Chronic diastolic CHF (congestive heart failure) ([place]) 08/25/2017  . Current use of long term anticoagulation 08/11/2017  . Hypothyroid 06/13/2017  . Palpitations 06/13/2017  . Heart murmur 06/13/2017  . S/P laminectomy 07/10/2013  . HTN (hypertension) 07/10/2013  . BLADDER CANCER 12/17/2008  . DIVERTICULOSIS OF COLON 12/12/2008  . COLONIC POLYPS, HYPERPLASTIC, HX OF 12/12/2008   Past Medical History:  Past Medical History:  Diagnosis Date  . DDD (degenerative disc disease), cervical    severe/  auto fusion c2-c5  . Diverticulosis   . Hemorrhoids   . History of adenomatous polyp of colon    1995  adenomatous polyp/  1997 & 2003  hyperplastic polyp's  . History of bladder cancer    s/p  TURBT's, urologist-  Dr [name]  . History of cancer of ureter   . Hypertension   . Hypothyroid   . Nocturia   . Osteoarthritis   . Osteopenia   . Symptomatic  bradycardia 05/06/2019   Past Surgical History:  Past Surgical History:  Procedure Laterality Date  . BACK SURGERY    . CARDIOVERSION N/A 08/09/2017   Procedure: CARDIOVERSION;  Surgeon: Larey Dresser, MD;  Location[facility] ENDOSCOPY;  Service: Cardiovascular;  Laterality: N/A;  . CATARACT EXTRACTION W/ INTRAOCULAR LENS  IMPLANT, BILATERAL Bilateral   . CYSTOSCOPY W/ RETROGRADES  10/03/2011   Procedure: CYSTOSCOPY WITH RETROGRADE PYELOGRAM;  Surgeon: Molli Hazard, MD;  Location: [facility];  Service: Urology;;  . Consuela Mimes W/ RETROGRADES Bilateral 06/17/2016   Procedure: CYSTOSCOPY WITH RETROGRADE PYELOGRAM;  Surgeon: Alexis Frock, MD;  Location: [facility];  Service: Urology;  Laterality: Bilateral;  . CYSTOSCOPY WITH BIOPSY N/A 06/17/2016   Procedure: CYSTOSCOPY WITH BIOPSY AND FULGERATION;  Surgeon: Alexis Frock, MD;  Location: [facility];  Service: Urology;  Laterality: N/A;  . LAMINECTOMY  2009   L3 - 4  . ORIF ANKLE FRACTURE Left 08/28/2017   Procedure: OPEN REDUCTION INTERNAL FIXATION (ORIF) ANKLE FRACTURE;  Surgeon: Shona Needles, MD;  Location: [facility] ORS;  Service: Orthopedics;  Laterality: Left;  . TRANSURETHRAL RESECTION OF BLADDER  x3 --  2009; 2010; 06-29-2010  . TRANSURETHRAL RESECTION OF BLADDER TUMOR  10/03/2011   Procedure: TRANSURETHRAL RESECTION OF BLADDER TUMOR (TURBT);  Surgeon: Molli Hazard, MD;  Location: [facility];  Service: Urology;  Laterality: N/A;   HPI:  Pt is a 85 y.o. female w/ hx  D-CHF, PAF on amio & Eliquis, HTN, DDD, EF 65-70% w/ mild AS (mean gradient 11 mmHg) by echo 11/2017  was admitted 08/31 with dizziness and bradycardia. MRI reveals acute infarcts in the right pons, right cerebellum, left putamen and the right convexity 7 mm thick left hemispheric fluid collection combination of CSF and hemmoragic origin.    Assessment / Plan / Recommendation Clinical Impression  Pt  reported that she was living independently prior to admission. She denied any baseline deficits in speech or language but stated that she has some difficulty with memory and therefore uses external memory aids to compensate for this. She denied any acute changes in speech, language or cognition. Her speech and language skills are currently within normal limits. Her cognitive-linguistic skills appeared within functional limits but some difficulty was noted with memory as she reported. Further skilled SLP services are not clinically indicated at this time. Pt was educated regarding results and recommendation and she verbalized understanding as well as agreement with plan of care.    SLP Assessment  SLP Recommendation/Assessment: Patient does not need any further Speech Lanaguage Pathology Services SLP Visit Diagnosis: Cognitive communication deficit (R41.841)    Follow Up Recommendations  None    Frequency and Duration           SLP Evaluation Cognition  Overall Cognitive Status: Within Functional Limits for tasks assessed Arousal/Alertness: Awake/alert Orientation Level: Oriented X4 Attention: Focused;Sustained Focused Attention: Appears intact Sustained Attention: Appears intact Memory: Impaired Memory Impairment: Storage deficit;Retrieval deficit;Decreased recall of new information(Immediate: 3/3; delayed: 2/3; with cues: 1/1) Awareness: Appears intact Problem Solving: Appears intact       Comprehension  Auditory Comprehension Overall Auditory Comprehension: Appears within functional limits for tasks assessed Yes/No Questions: Within Functional Limits Basic Biographical Questions: (5/5) Complex Questions: (5/5) Commands: Within Functional Limits(2 & 3-step: 4/4) Conversation: Complex Visual Recognition/Discrimination Discrimination: Within Function Limits Reading Comprehension Reading Status: Within funtional limits    Expression Expression Primary Mode of Expression:  Verbal Verbal Expression Overall Verbal Expression: Appears within functional limits for tasks assessed Initiation: No impairment Automatic Speech: Day of week;Month of year(WNL) Level of Generative/Spontaneous Verbalization: Conversation Repetition: No impairment(5/5) Naming: No impairment Responsive: (5/5) Confrontation: Within functional limits(10/10) Pragmatics: No impairment   Oral / Pharmacist, community Speech Overall Motor Speech: Appears within functional limits for tasks assessed Respiration: Within functional limits Phonation: Normal Resonance: Within functional limits Intelligibility: Intelligible Motor Planning: Witnin functional limits Motor Speech Errors: Not applicable   Elbridge Magowan I. Hardin Negus, [place], [place] Office number [phone] Pager [place] 05/09/2019, 5:47 PM

## 2019-05-09 NOTE — Progress Notes (Signed)
PROGRESS NOTE    Rebecca Norman  [id number] DOB: 03-24-1925 DOA: 05/06/2019 PCP: [name], MD    Brief Narrative:  85 y/o female admitted to the hospital with dizziness, falls and generalized weakness. Found to be bradycardic on admission. Admitted to hospital under cardiology service for cardiac work up. Echo unrevealing. Seen by EP who did not feel that her bradycardia was causing her symptoms. MRI brain done for dizziness that showed bilateral infarct that appeared embolic. Neurology/stroke team following and further stroke work up has been ordered.   Assessment & Plan:   Principal Problem:   Bradycardia Active Problems:   HTN (hypertension)   Hypothyroid   Anemia, unspecified   Paroxysmal atrial fibrillation (HCC)   Acute on chronic diastolic CHF (congestive heart failure) (HCC)   Dizziness   Falls   Hypokalemia   Aortic valve sclerosis   PAT (paroxysmal atrial tachycardia) (HCC)   Blocked premature atrial contraction   Acute embolic stroke ([place])   1. Acute stroke, likely embolic infarcts with small fluid collection, possibly hygroma from prior SDH with 58mm midline shift. Neurology following. Will order MRA of head and neck. Echo has already been completed. A1c 5.6. Start on lipitor pending lipid panel. She is currently not on anti platelet agents due to concern for blood in fluid collection. Will defer to neuro. Seen by physical therapy with recommendations for SNF 2. Dizziness with falls. Likely related to #1. Continue with physical therapy 3. Bradycardia( in context of PAF, atrial tachycardia). Noted to be in Mobitz type 1 block. Seen by electrophysiology and not felt to be causing patient's dizziness. Recommendations to avoid AV nodal blocking agents. Amiodarone has been discontinued. They did not feel that a pacer device would improve the patient's quality of life. Eliquis currently on hold pending stroke team evaluation. 4. Acute on chronic diastolic CHF. She  was diuresed with intravenous lasix and appears to be euvolemic. Currently transitioned back to her home dose of lasix once daily. 5. Chronic respiratory failure with hypoxia due to chronic CHF. She is chronically on supplemental oxygen at home. She reports being on 4L for the last several months.   6. Hypothyroidism. TSH recently 4.5, which would be in normal range for patient's age. Free T4, unremarkable 7. Anemia, chronic. No signs of bleeding. For outpatient follow up   DVT prophylaxis: SCDs Code Status: DNR Family Communication: none present Disposition Plan: SNF facility on discharge once neurologic work up is complete   Consultants:   Cardiology/electrophysiology  Neurology  Procedures:     Antimicrobials:       Subjective: Denies any shortness of breath. No chest pain. Still feeling dizzy  Objective: Vitals:   05/08/19 2226 05/09/19 0500 05/09/19 0921 05/09/19 1115  BP: (!) 99/51  (!) 175/62 (!) 117/58  Pulse:  (!) 49  (!) 55  Resp:  16 16 13   Temp: 98.3 F (36.8 C) 97.8 F (36.6 C)    TempSrc: Oral Oral  Oral  SpO2:    99%  Weight:  77.1 kg    Height:        Intake/Output Summary (Last 24 hours) at 05/09/2019 1139 Last data filed at 05/09/2019 1116 Gross per 24 hour  Intake 60 ml  Output -  Net 60 ml   Filed Weights   05/07/19 0500 05/08/19 0733 05/09/19 0500  Weight: 75.6 kg 77.1 kg 77.1 kg    Examination:  General exam: Appears calm and comfortable  Respiratory system: Clear to auscultation. Respiratory effort normal.  Cardiovascular system: S1 & S2 heard, RRR. No JVD, murmurs, rubs, gallops or clicks.  Gastrointestinal system: Abdomen is nondistended, soft and nontender. No organomegaly or masses felt. Normal bowel sounds heard. Central nervous system: Alert and oriented. No focal neurological deficits. Extremities: trace edema bilaterally. Skin: No rashes, lesions or ulcers Psychiatry: Judgement and insight appear normal. Mood & affect  appropriate.     Data Reviewed: I have personally reviewed following labs and imaging studies  CBC: Recent Labs  Lab 05/06/19 1053 05/07/19 0434 05/08/19 0443 05/09/19 0352  WBC 8.1 5.9 6.7 5.9  NEUTROABS 6.7  --   --   --   HGB 10.7* 10.2* 10.6* 10.7*  HCT 34.6* 31.4* 33.0* 33.9*  MCV 93.0 90.0 89.7 91.9  PLT 279 247 263 0000000   Basic Metabolic Panel: Recent Labs  Lab 05/06/19 1053 05/06/19 1629 05/06/19 1840 05/07/19 0434 05/08/19 0855 05/09/19 0352  NA 136  --   --  137 136 136  K 4.2  --   --  3.4* 4.0 4.6  CL 100  --   --  99 95* 97*  CO2 26  --   --  26 29 26   GLUCOSE 98  --   --  72 127* 85  BUN 16  --   --  17 17 26*  CREATININE 0.93 0.95 0.83 0.91 1.09* 1.03*  CALCIUM 8.4*  --   --  8.3* 8.9 8.7*  MG 1.9  --   --   --   --   --    GFR: Estimated Creatinine Clearance: 35 mL/min (A) (by C-G formula based on SCr of 1.03 mg/dL (H)). Liver Function Tests: Recent Labs  Lab 05/07/19 0434  AST 11*  ALT 10  ALKPHOS 62  BILITOT 0.5  PROT 5.3*  ALBUMIN 2.6*   No results for input(s): LIPASE, AMYLASE in the last 168 hours. No results for input(s): AMMONIA in the last 168 hours. Coagulation Profile: No results for input(s): INR, PROTIME in the last 168 hours. Cardiac Enzymes: No results for input(s): CKTOTAL, CKMB, CKMBINDEX, TROPONINI in the last 168 hours. BNP (last 3 results) No results for input(s): PROBNP in the last 8760 hours. HbA1C: Recent Labs    05/09/19 0352  HGBA1C 5.6   CBG: Recent Labs  Lab 05/07/19 1152  GLUCAP 237*   Lipid Profile: Recent Labs    05/09/19 0352  CHOL 127  HDL 40*  LDLCALC 65  TRIG 110  CHOLHDL 3.2   Thyroid Function Tests: Recent Labs    05/08/19 0855  FREET4 1.10   Anemia Panel: Recent Labs    05/08/19 0855  VITAMINB12 2,022*  FOLATE 10.0  FERRITIN 55  TIBC 294  IRON 56  RETICCTPCT 2.3   Sepsis Labs: No results for input(s): PROCALCITON, LATICACIDVEN in the last 168 hours.  Recent Results  (from the past 240 hour(s))  SARS CORONAVIRUS 2 (TAT 6-24 HRS) Nasopharyngeal Nasopharyngeal Swab     Status: None   Collection Time: 05/06/19 12:31 PM   Specimen: Nasopharyngeal Swab  Result Value Ref Range Status   SARS Coronavirus 2 NEGATIVE NEGATIVE Final    Comment: (NOTE) SARS-CoV-2 target nucleic acids are NOT DETECTED. The SARS-CoV-2 RNA is generally detectable in upper and lower respiratory specimens during the acute phase of infection. Negative results do not preclude SARS-CoV-2 infection, do not rule out co-infections with other pathogens, and should not be used as the sole basis for treatment or other patient management decisions. Negative results must be  combined with clinical observations, patient history, and epidemiological information. The expected result is Negative. Fact Sheet for Patients: SugarRoll.be Fact Sheet for Healthcare Providers: https://www.woods-mathews.com/ This test is not yet approved or cleared by the [place] FDA and  has been authorized for detection and/or diagnosis of SARS-CoV-2 by FDA under an Emergency Use Authorization (EUA). This EUA will remain  in effect (meaning this test can be used) for the duration of the COVID-19 declaration under Section 56 4(b)(1) of the Act, 21 U.S.C. section 360bbb-3(b)(1), unless the authorization is terminated or revoked sooner. Performed at [facility], [place] [address], [place], [place] [address]          Radiology Studies: Ct Head Wo Contrast  Result Date: 05/09/2019 CLINICAL DATA:  Stroke follow-up EXAM: CT HEAD WITHOUT CONTRAST TECHNIQUE: Contiguous axial images were obtained from the base of the skull through the vertex without intravenous contrast. COMPARISON:  CT head May 06, 2019 FINDINGS: Brain: Redemonstration of the hypoattenuating extra-axial CSF fluid collection measuring only 6 mm in maximal thickness over the left frontal convexity. Could  reflect chronic subdural or acute or chronic hygroma. No high attenuation blood products are present. Mild left-to-right midline shift approximately 3 mm is grossly stable from comparison study. Stable lacune in the right caudate additional lacune versus prominent perivascular spaces seen in the right putamen. No evidence of acute infarction, hemorrhage, hydrocephalus, extra-axial collection or mass lesion/mass effect. Symmetric prominence of the ventricles, cisterns and sulci compatible with parenchymal volume loss. Patchy areas of white matter hypoattenuation are most compatible with chronic microvascular angiopathy. Vascular: Atherosclerotic calcification of the carotid siphons and intradural vertebral arteries. No hyperdense vessel or dural venous sinus. Skull: No calvarial fracture or suspicious osseous lesion. No scalp swelling or hematoma. Sinuses/Orbits: Paranasal sinuses and mastoid air cells are predominantly clear. Included orbital structures are unremarkable. Other: None IMPRESSION: 1. Unchanged hypoattenuating extra-axial CSF fluid collection over the left frontal convexity, which may reflect chronic subdural versus hygroma. No high attenuation blood products are present. 2. Unchanged mild left-to-right midline shift approximately 3 mm. 3. No acute intracranial abnormality. Electronically Signed   By: Lovena Le M.D.   On: 05/09/2019 05:14   Mr Brain Wo Contrast  Result Date: 05/08/2019 CLINICAL DATA:  Stroke. EXAM: MRI HEAD WITHOUT CONTRAST TECHNIQUE: Multiplanar, multiecho pulse sequences of the brain and surrounding structures were obtained without intravenous contrast. COMPARISON:  CT head 05/06/2019 FINDINGS: Brain: Acute infarct right paramedian pons Small acute infarct right cerebellum Small acute infarct left posterior putamen Punctate infarct in the right convexity cortex which may be in the postcentral gyrus. Generalized atrophy. Mild chronic microvascular ischemic change in the white  matter 7 mm left convexity fluid collection. Fluid is predominately CSF however there are small scattered areas of blood in the fluid. This is causing mass-effect and mild midline shift to the right of 3 mm. Vascular: Normal arterial flow void Skull and upper cervical spine: Negative Sinuses/Orbits: Negative Other: None IMPRESSION: Acute infarcts in the right pons, right cerebellum, left putamen, and right convexity cortex 7 mm thick left hemispheric fluid collection. Fluid is predominately CSF however there are scattered areas of hemorrhage which could be subacute or chronic within the subdural fluid. Mild midline shift to the right. Electronically Signed   By: Franchot Gallo M.D.   On: 05/08/2019 15:39        Scheduled Meds: . furosemide  40 mg Oral Daily  . levothyroxine  50 mcg Oral QODAY  . levothyroxine  75  mcg Oral QODAY  . mouth rinse  15 mL Mouth Rinse BID  . sertraline  50 mg Oral QHS  . sodium chloride flush  3 mL Intravenous Q12H  . vitamin B-12  1,000 mcg Oral Daily   Continuous Infusions: . sodium chloride       LOS: 3 days    Time spent: 31mins    Kathie Dike, MD [facility]   If 7PM-7AM, please contact night-coverage [email or web address]  05/09/2019, 11:39 AM

## 2019-05-09 NOTE — Progress Notes (Addendum)
   See afternoon note yesterday - strokes felt likely the cause of her dizziness. Still with brief intermittent periods of bradycardia to 20s-30s around 8-8:30 this AM. Otherwise largely appears NSR in the 50s - amiodarone will likely take time to wash out. Per EP note, " I do not think that pacing would improve her overall quality of life.  She and discussed at length.  She is clear that she would like to avoid pacing. EP would therefore continue to advise a conservative approach at this time." Dr. Meda Coffee will review with EP for final input but if no further procedures planned, anticipate [facility] will sign off.  General Cardiology Medication Recommendations:   - Oral Lasix resumed today at 40mg  once daily (patient was taking it this way at home) - Avoid AVN blocking agents - Keep off amiodarone due to bradycardia - Will stop potassium now that she is no longer on IV Lasix (has uptrending K and had hyperkalemia as outpatient) - Anticoagulation at the direction of neurology   Other recommendations (labs, testing, etc):  Needs primary care f/u for anemia Follow up as an outpatient:  Scheduled 9/25 with Kerin Ransom, PA-C on Dr. Jacalyn Lefevre care team.  Melina Copa PA-C

## 2019-05-09 NOTE — Progress Notes (Signed)
STROKE TEAM PROGRESS NOTE   INTERVAL HISTORY I have personally reviewed history of presenting illness with the patient, electronic medical records as well as imaging films in PACS.  She presented with dizziness and generalized weakness and MRI scan of the brain shows multiple infarcts involving right pons, right cerebellum, left basal ganglia and right parietal cortex likely of embolic etiology from atrial fibrillation despite being on anticoagulation with Eliquis.  Patient states she has been compliant with Eliquis and not missed any doses.  Vitals:   05/08/19 0733 05/08/19 1408 05/08/19 2226 05/09/19 0500  BP: 110/60 (!) 145/58 (!) 99/51   Pulse: 99 60  (!) 49  Resp:  20  16  Temp: (!) 97.5 F (36.4 C)  98.3 F (36.8 C) 97.8 F (36.6 C)  TempSrc: Oral  Oral Oral  SpO2: 98% 100%    Weight: 77.1 kg   77.1 kg  Height:        CBC:  Recent Labs  Lab 05/06/19 1053  05/08/19 0443 05/09/19 0352  WBC 8.1   < > 6.7 5.9  NEUTROABS 6.7  --   --   --   HGB 10.7*   < > 10.6* 10.7*  HCT 34.6*   < > 33.0* 33.9*  MCV 93.0   < > 89.7 91.9  PLT 279   < > 263 249   < > = values in this interval not displayed.    Basic Metabolic Panel:  Recent Labs  Lab 05/06/19 1053  05/08/19 0855 05/09/19 0352  NA 136   < > 136 136  K 4.2   < > 4.0 4.6  CL 100   < > 95* 97*  CO2 26   < > 29 26  GLUCOSE 98   < > 127* 85  BUN 16   < > 17 26*  CREATININE 0.93   < > 1.09* 1.03*  CALCIUM 8.4*   < > 8.9 8.7*  MG 1.9  --   --   --    < > = values in this interval not displayed.   Lipid Panel:     Component Value Date/Time   CHOL 127 05/09/2019 0352   TRIG 110 05/09/2019 0352   HDL 40 (L) 05/09/2019 0352   CHOLHDL 3.2 05/09/2019 0352   VLDL 22 05/09/2019 0352   LDLCALC 65 05/09/2019 0352   HgbA1c:  Lab Results  Component Value Date   HGBA1C 5.6 05/09/2019   Urine Drug Screen: No results found for: LABOPIA, COCAINSCRNUR, LABBENZ, AMPHETMU, THCU, LABBARB  Alcohol Level No results found for:  [place] elderly lady not in distress.  She is hard of hearing. . Afebrile. Head is nontraumatic. Neck is supple without bruit.    Cardiac exam no murmur or gallop. Lungs are clear to auscultation. Distal pulses are well felt. Neurological Exam ;  Awake  Alert oriented x 3.  Diminished attention, registration and recall.  Normal speech and language.eye movements full without nystagmus.fundi were not visualized. Vision acuity and fields appear normal. Hearing is mildly diminished bilaterally.  Palatal movements are normal. Face asymmetric with mild left nasolabial fold weakness.. Tongue midline. Normal strength, tone, reflexes and coordination. Normal sensation. Gait deferred.  ASSESSMENT/PLAN Ms. LLULIANA FERRELL is a 85 y.o. female with history of symptomatic bradycardia, osteopenia, osteoarthritis, hypertension, cancer of uterus, bladder cancer, hemorrhoids, diverticulitis, and degenerative disc disease presenting with generalized weakness and feeling off balance .   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  MRI  R pons, R cerebellum, L putamen, R convexity cortical infarcts. L hemispheric fluid 15mm thick SDH, mostly CSF w/ scattered hemorrhage throughout. R midline shift   CT head 9/3 unchanged L frontal SDH. Unchanged midline shift.     MRA head and neck pending   2D Echo EF >65%. LA severely dilated. No source of embolus   LDL 65  HgbA1c 5.6  Resume Eliquis for VTE prophylaxis  Eliquis (apixaban) daily prior to admission, now on No antithrombotic. Likely d/c on Eliquis but having care management assess cost of Pradaxa. May change. Ok to resume eliquis. Ordered.  Therapy recommendations:  SNF  Disposition:  pending   Paroxysmal Atrial Fibrillation  Home anticoagulation:  Eliquis (apixaban) daily   On amiodarone  Last INR  1.32 . Continue Eliquis (apixaban) daily, consider change to Pradaxa at discharge, case manager checking price  Orthostatic hypotension Hypertension  Variable from 99-175 . From stroke perspective, permissive hypertension (OK if < 220/120) but gradually normalize in 5-7 days . Long-term BP goal normotensive  Other Stroke Risk Factors  Advanced age  Former Cigarette smoker, quit 13 yrs ago  ETOH use, alcohol level No results found,  advised to drink no more than 1 drink(s) a day  Obesity, Body mass index is 30.11 kg/m., recommend weight loss, diet and exercise as appropriate   Acute on chronic congestive heart failure   Hx mild AS  Other Active Problems  Elevated troponin  Hypokalemia  Chronic appearing anemia  Hypothyroidism   Symptomatic bradycardia  Hx bladder cancer  Hospital day # 3  I have personally obtained history,examined this patient, reviewed notes, independently viewed imaging studies, participated in medical decision making and plan of care.ROS completed by me personally and pertinent positives fully documented  I have made any additions or clarifications directly to the above note.  She presented with dizziness and generalized weakness secondary to by cerebral small embolic infarcts likely from atrial fibrillation despite being on anticoagulation with Eliquis and being compliant.  I had a long discussion with the patient with available alternatives to Eliquis and lack of definitive data demonstrating superiority of 1 agent over the other.  She may consider switching to Pradaxa because it has a slightly different mechanism of action but her insurance premium for co-pay may go up and she may not want to do so.  Similarly addition of aspirin has not been shown to give any definite proven added benefit but will increase the bleeding risk.  Patient may want to stay on Eliquis unless switching to Pradaxa involves no extra increase in cost to her..  Discussed with  patient and Dr. Roderic Palau and answered questions.  Greater than 50% time during this 35-minute visit was spent on counseling and coordination of care about her embolic stroke and discussion about anticoagulation options and planning treatment and answering questions  Antony Contras, MD Medical Director [place] Pager: [phone] 05/09/2019 5:10 PM   To contact Stroke Continuity provider, please refer to [email or web address] After hours, contact General Neurology

## 2019-05-09 NOTE — TOC Benefit Eligibility Note (Signed)
Transition of Care [facility]) Benefit Eligibility Note    Patient Details  Name: Rebecca Norman MRN: [id number] Date of Birth: 1925-08-22   Medication/Dose: ELIQUIS   5 MG BID  Covered?: Yes  Tier: (NO TIER)  Prescription Coverage [facility]erred [facility]  Spoke with Person/Company/Phone Number:: STAR  @ [facility] # [phone]  Co-Pay: $112.87  Prior Approval: Yes([phone])  Deductible: (NO DEDUCTIBLE   OUT-OF-POCKET-NOT MET)  Additional Notes: DABIGATRAN 150 MG BID- NOT COVER, P/A-YES # [phone]    Memory Argue Phone Number: 05/09/2019, 1:49 PM

## 2019-05-09 NOTE — Progress Notes (Signed)
Met patient on unit rounds.  Attempted to establish a relationship of care and support.  Patient awake and alert but hard of hearing. Provided prayer.  Will revisit at  Next unit rounds.

## 2019-05-10 ENCOUNTER — Encounter (HOSPITAL_COMMUNITY): Payer: Self-pay | Admitting: *Deleted

## 2019-05-10 DIAGNOSIS — R42 Dizziness and giddiness: Secondary | ICD-10-CM

## 2019-05-10 DIAGNOSIS — I10 Essential (primary) hypertension: Secondary | ICD-10-CM

## 2019-05-10 DIAGNOSIS — I471 Supraventricular tachycardia: Secondary | ICD-10-CM

## 2019-05-10 DIAGNOSIS — E876 Hypokalemia: Secondary | ICD-10-CM

## 2019-05-10 LAB — CBC
HCT: 34.4 % — ABNORMAL LOW (ref 36.0–46.0)
Hemoglobin: 11 g/dL — ABNORMAL LOW (ref 12.0–15.0)
MCH: 28.9 pg (ref 26.0–34.0)
MCHC: 32 g/dL (ref 30.0–36.0)
MCV: 90.5 fL (ref 80.0–100.0)
Platelets: 251 10*3/uL (ref 150–400)
RBC: 3.8 MIL/uL — ABNORMAL LOW (ref 3.87–5.11)
RDW: 13.5 % (ref 11.5–15.5)
WBC: 6.9 10*3/uL (ref 4.0–10.5)
nRBC: 0 % (ref 0.0–0.2)

## 2019-05-10 LAB — BASIC METABOLIC PANEL
Anion gap: 8 (ref 5–15)
BUN: 24 mg/dL — ABNORMAL HIGH (ref 8–23)
CO2: 30 mmol/L (ref 22–32)
Calcium: 9 mg/dL (ref 8.9–10.3)
Chloride: 97 mmol/L — ABNORMAL LOW (ref 98–111)
Creatinine, Ser: 0.93 mg/dL (ref 0.44–1.00)
GFR calc Af Amer: >60 mL/min (ref >60)
GFR calc non Af Amer: 54 mL/min — ABNORMAL LOW (ref >60)
Glucose, Bld: 87 mg/dL (ref 70–99)
Potassium: 4.8 mmol/L (ref 3.5–5.1)
Sodium: 135 mmol/L (ref 135–145)

## 2019-05-10 LAB — GLUCOSE, CAPILLARY: Glucose-Capillary: 111 mg/dL — ABNORMAL HIGH (ref 70–99)

## 2019-05-10 LAB — SARS CORONAVIRUS 2 (TAT 6-24 HRS): SARS Coronavirus 2: NEGATIVE

## 2019-05-10 LAB — APTT: aPTT: 34 seconds (ref 24–36)

## 2019-05-10 NOTE — TOC Progression Note (Signed)
Transition of Care [facility]) - Progression Note    Patient Details  Name: Rebecca Norman MRN: [id number] Date of Birth: 09/18/1924  Transition of Care [facility]) CM/SW Contact  Eileen Stanford, LCSW Phone Number: 05/10/2019, 11:45 AM  Clinical Narrative:   Pt has decided on [facility]. [facility] can take pt once covid comes back.    Expected Discharge Plan: [place] Barriers to Discharge: Continued Medical Work up  Expected Discharge Plan and Services Expected Discharge Plan: [place] In-house Referral: NA   Post Acute Care Choice: NA Living arrangements for the past 2 months: Single Family Home                                       Social Determinants of Health (SDOH) Interventions    Readmission Risk Interventions No flowsheet data found.

## 2019-05-10 NOTE — Progress Notes (Signed)
To piggyback on s/o note yesterday, Dr. Meda Coffee d/w Dr. Rayann Heman and no plans for pacer. Cardiology s/o yesterday - please call with questions. Jaelah Hauth PA-C

## 2019-05-10 NOTE — Discharge Instructions (Signed)

## 2019-05-10 NOTE — Progress Notes (Signed)
STROKE TEAM PROGRESS NOTE   INTERVAL HISTORY Patient is sitting up in a bedside chair sleeping.  She can be easily aroused.  She follows commands well.  The patient's insurance will cover Eliquis but not Pradaxa hence it would be too expensive to switch and recommend continuing Eliquis.  Patient is likely need rehabilitation in the skilled nursing setting.  Vitals:   05/09/19 1115 05/09/19 2053 05/10/19 0426 05/10/19 1300  BP: (!) 117/58 (!) 134/48  (!) 125/47  Pulse: (!) 55 66  (!) 54  Resp: 13 18  18   Temp:  97.8 F (36.6 C) 98.5 F (36.9 C) 98 F (36.7 C)  TempSrc: Oral Oral Oral Oral  SpO2: 99% 98%  99%  Weight:   70.9 kg   Height:        CBC:  Recent Labs  Lab 05/06/19 1053  05/09/19 0352 05/10/19 0415  WBC 8.1   < > 5.9 6.9  NEUTROABS 6.7  --   --   --   HGB 10.7*   < > 10.7* 11.0*  HCT 34.6*   < > 33.9* 34.4*  MCV 93.0   < > 91.9 90.5  PLT 279   < > 249 251   < > = values in this interval not displayed.    Basic Metabolic Panel:  Recent Labs  Lab 05/06/19 1053  05/09/19 0352 05/10/19 0415  NA 136   < > 136 135  K 4.2   < > 4.6 4.8  CL 100   < > 97* 97*  CO2 26   < > 26 30  GLUCOSE 98   < > 85 87  BUN 16   < > 26* 24*  CREATININE 0.93   < > 1.03* 0.93  CALCIUM 8.4*   < > 8.7* 9.0  MG 1.9  --   --   --    < > = values in this interval not displayed.   Lipid Panel:     Component Value Date/Time   CHOL 127 05/09/2019 0352   TRIG 110 05/09/2019 0352   HDL 40 (L) 05/09/2019 0352   CHOLHDL 3.2 05/09/2019 0352   VLDL 22 05/09/2019 0352   LDLCALC 65 05/09/2019 0352   HgbA1c:  Lab Results  Component Value Date   HGBA1C 5.6 05/09/2019   Urine Drug Screen: No results found for: LABOPIA, COCAINSCRNUR, LABBENZ, AMPHETMU, THCU, LABBARB  Alcohol Level No results found for: [place] elderly lady not in distress.  She is hard of hearing. . Afebrile. Head is nontraumatic. Neck is supple without bruit.    Cardiac exam no murmur  or gallop. Lungs are clear to auscultation. Distal pulses are well felt. Neurological Exam ;  Awake  Alert oriented x 3.  Diminished attention, registration and recall.  Normal speech and language.eye movements full without nystagmus.fundi were not visualized. Vision acuity and fields appear normal. Hearing is mildly diminished bilaterally.  Palatal movements are normal. Face asymmetric with mild left nasolabial fold weakness.. Tongue midline. Normal strength, tone, reflexes and coordination. Normal sensation. Gait deferred.  ASSESSMENT/PLAN Ms. JULIONA BRIGHTMAN is a 85 y.o. female with history of symptomatic bradycardia, osteopenia, osteoarthritis, hypertension, cancer of uterus, bladder cancer, hemorrhoids, diverticulitis, and degenerative disc disease presenting with generalized weakness and feeling off balance .   Stroke:   Scattered bilateral infarcts embolic secondary to known AF on Eliquis Chronic L SDH secondary to fall   CT head 8/31 61mm L SDH w/  mild midline shift.  MRI  R pons, R cerebellum, L putamen, R convexity cortical infarcts. L hemispheric fluid 66mm thick SDH, mostly CSF w/ scattered hemorrhage throughout. R midline shift   CT head 9/3 unchanged L frontal SDH. Unchanged midline shift.    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 Large posterior communicating arteries allow supply to the  distal basilar branches.  2D Echo EF >65%. LA severely dilated. No source of embolus   LDL 65  HgbA1c 5.6  Resume Eliquis for VTE prophylaxis  Eliquis (apixaban) daily prior to admission, now on No antithrombotic. Likely d/c on Eliquis but having care management assess cost of Pradaxa. May change. Ok to resume eliquis. Ordered.  Therapy recommendations:  SNF  Disposition:  pending   Paroxysmal Atrial Fibrillation  Home  anticoagulation:  Eliquis (apixaban) daily   On amiodarone  Last INR 1.32 . Continue Eliquis (apixaban) daily, consider change to Pradaxa at discharge, case manager checking price  Orthostatic hypotension Hypertension  Variable from 99-175 . From stroke perspective, permissive hypertension (OK if < 220/120) but gradually normalize in 5-7 days . Long-term BP goal normotensive  Other Stroke Risk Factors  Advanced age  Former Cigarette smoker, quit 13 yrs ago  ETOH use, alcohol level No results found,  advised to drink no more than 1 drink(s) a day  Obesity, Body mass index is 27.71 kg/m., recommend weight loss, diet and exercise as appropriate   Acute on chronic congestive heart failure   Hx mild AS  Other Active Problems  Elevated troponin  Hypokalemia  Chronic appearing anemia  Hypothyroidism   Symptomatic bradycardia  Hx bladder cancer  Hospital day # 4   She presented with dizziness and generalized weakness secondary to by cerebral small embolic infarcts likely from atrial fibrillation despite being on anticoagulation with Eliquis and being compliant.  I had a long discussion with the patient with available alternatives to Eliquis and lack of definitive data demonstrating superiority of 1 agent over the other.  She may consider switching to Pradaxa because it has a slightly different mechanism of action but her insurance premium for co-pay may go up and she may not want to do so.  Similarly addition of aspirin has not been shown to give any definite proven added benefit but will increase the bleeding risk.  Patient may want to stay on Eliquis as pradaxa is not covered by her insurance and a lot more expensive for her..  Discussed with patient and Dr. Ree Kida and answered questions.  Stroke service will sign off.Call for questions.  Antony Contras, MD Medical Director [facility] Pager: [phone] 05/10/2019 2:39 PM   To contact Stroke Continuity  provider, please refer to [email or web address] After hours, contact General Neurology

## 2019-05-10 NOTE — Progress Notes (Signed)
PROGRESS NOTE    Rebecca Norman  [id number] DOB: 10/20/1924 DOA: 05/06/2019 PCP: [name], MD   Brief Narrative:  HPI On 05/06/2019 by Dr. Ena Dawley, Cardiology Rebecca Norman fell about a week ago.  She did not lose consciousness.  She had gotten up to the bathroom, and then had gone into the kitchen.  This was about 4 AM.  As she headed back to the gym to sit in the recliner, she said her legs got weak and she ended up on the floor.  That is her only recent fall.  Ever since then, she has felt weak.  She has no complaints of chest pain.  She feels her breathing is at baseline.  She feels the Lasix is taking care of the edema.  She may have a little orthopnea, that is chronic with no recent change.  She denies PND.  Feels her dyspnea on exertion is at baseline.  Today, when she got up, she felt lightheaded.  She was a little dizzy.  She had an episode of vomiting.  EMS was called and transported her to the emergency room.  She was initially hypertensive.  To the ER nurse, she reported walking 3 days ago with a walker and her legs went out, striking the left side of her head.  It is unclear if that is the same fall that she described to me in the middle of the night which was a week ago.  She has not had palpitations.  If she has been in atrial fibrillation, she is not aware of it.  She is compliant with her medications including amiodarone and Eliquis.  She had a telemedicine visit with Dr. [name] on 7/30.  No blood pressure or heart rate are recorded from that day.  At that visit, labs were ordered which were done 8/25.  Her potassium was 5.4.  Her K. Dur was discontinued.  She was to get a repeat BMET in 1 week.  Currently in the emergency room, she is resting comfortably.  On telemetry, she has been in Mobitz 1 heart block, with a heart rate that drops into the 30s at times.  She denies feeling lightheaded or dizzy since she has been in the ER, but has not been out of  bed.  Interim history Admitted by cardiology for bradycardia. EP was also consulted.  Found to have balance issues/dizziness, MRI showed Acute CVA. Neurology consulted and TRH asked to assume care. Pending SNF placement.  Assessment & Plan   Acute CVA -Patient went dizziness -MRI brain: Acute infarcts in the right pons and cerebellum, left putamen, right convexity cortex -MRA head and neck: No neck vessel abnormality or significance on noncontrast neck MRI.  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  Large posterior communicating arteries allow supply to distal basilar branches. -Echocardiogram obtained showing an EF> 65%.  Mild concentric LV hypertrophy.  LV diastolic Doppler parameters consistent with pseudonormalization.  No regional wall motion abnormalities.  Mitral valve is degenerative.  Moderate mitral annular calcification present.  Mitral valve regurgitation is moderate.  Valve sclerosis without stenosis. -Hemoglobin A1c 5.6, LDL 65 -PT and OT recommending SNF -Neurology consulted and appreciated, recommended continuing Eliquis.  Pradaxa may be a possibility however insurance co-pay may be too much.  Adding aspirin with did not show any benefit and may increase risk of bleeding.  Patient to follow-up with neurology. -Continue Eliquis, statin  Dizziness with falls -Likely related to acute CVA -  As above, PT and OT recommending SNF -Social work consulted  Bradycardia/ paroxysmal atrial fibrillation and atrial tachycardia -Noted to be in Mobitz type I AV block -Cardiology initially admitted patient, electrophysiology was consulted and appreciated -Not feel that bradycardia was leading to her dizziness and recommended to avoid AV nodal blocking agents.  Midodrin was also discontinued.  Pacemaker placement not recommended given that it would not improve patient's quality of life. -Continue Eliquis  Acute on  chronic diastolic heart failure -Patient was diuresed with IV Lasix and appears to be euvolemic at this time -Transition to oral Lasix, home dose 40 mg daily -Continue to monitor intake and output, daily weights  Chronic respiratory failure with hypoxia -Suspect secondary to heart failure -Patient is on chronic oxygen supplementation at home, and reports being on 4 L over the last several months  Hypothyroidism -TSH 4.530, FT4 1.10 -Continue Synthroid  Chronic normocytic anemia -hemoglobin currently stable, continue monitor CBC  Depression -Continue Zoloft  DVT Prophylaxis  Eliquis  Code Status: DNR  Family Communication: none at bedside  Disposition Plan: Admitted. Pending SNF placement. Pending COVID test.   Consultants Cardiology/EP Neurology  Procedures  Echocardiogram  Antibiotics   Anti-infectives (From admission, onward)   None      Subjective:   Rebecca Norman seen and examined today.  Patient with no complaints this morning.  Denies current chest pain, shortness of breath, abdominal pain, nausea or vomiting, diarrhea or constipation, headache.  Objective:   Vitals:   05/09/19 0921 05/09/19 1115 05/09/19 2053 05/10/19 0426  BP: (!) 175/62 (!) 117/58 (!) 134/48   Pulse:  (!) 55 66   Resp: 16 13 18    Temp:   97.8 F (36.6 C) 98.5 F (36.9 C)  TempSrc:  Oral Oral Oral  SpO2:  99% 98%   Weight:    70.9 kg  Height:        Intake/Output Summary (Last 24 hours) at 05/10/2019 0957 Last data filed at 05/10/2019 0912 Gross per 24 hour  Intake 543 ml  Output 400 ml  Net 143 ml   Filed Weights   05/08/19 0733 05/09/19 0500 05/10/19 0426  Weight: 77.1 kg 77.1 kg 70.9 kg    Exam  General: Well developed, well nourished, elderly, NAD  HEENT: NCAT, mucous membranes moist.   Cardiovascular: S1 S2 auscultated, RRR, no murmur  Respiratory: Clear to auscultation bilaterally   Abdomen: Soft, nontender, nondistended, + bowel sounds  Extremities: warm  dry without cyanosis clubbing. LE trace edema  Neuro: AAOx3, mild RLE weakness, hard of hearing, otherwise nonfocal  Psych: Pleasant, appropriate mood and affect   Data Reviewed: I have personally reviewed following labs and imaging studies  CBC: Recent Labs  Lab 05/06/19 1053 05/07/19 0434 05/08/19 0443 05/09/19 0352 05/10/19 0415  WBC 8.1 5.9 6.7 5.9 6.9  NEUTROABS 6.7  --   --   --   --   HGB 10.7* 10.2* 10.6* 10.7* 11.0*  HCT 34.6* 31.4* 33.0* 33.9* 34.4*  MCV 93.0 90.0 89.7 91.9 90.5  PLT 279 247 263 249 [id number]   Basic Metabolic Panel: Recent Labs  Lab 05/06/19 1053  05/06/19 1840 05/07/19 0434 05/08/19 0855 05/09/19 0352 05/10/19 0415  NA 136  --   --  137 136 136 135  K 4.2  --   --  3.4* 4.0 4.6 4.8  CL 100  --   --  99 95* 97* 97*  CO2 26  --   --  26 29 26  30  GLUCOSE 98  --   --  72 127* 85 87  BUN 16  --   --  17 17 26* 24*  CREATININE 0.93   < > 0.83 0.91 1.09* 1.03* 0.93  CALCIUM 8.4*  --   --  8.3* 8.9 8.7* 9.0  MG 1.9  --   --   --   --   --   --    < > = values in this interval not displayed.   GFR: Estimated Creatinine Clearance: 37.2 mL/min (by C-G formula based on SCr of 0.93 mg/dL). Liver Function Tests: Recent Labs  Lab 05/07/19 0434  AST 11*  ALT 10  ALKPHOS 62  BILITOT 0.5  PROT 5.3*  ALBUMIN 2.6*   No results for input(s): LIPASE, AMYLASE in the last 168 hours. No results for input(s): AMMONIA in the last 168 hours. Coagulation Profile: No results for input(s): INR, PROTIME in the last 168 hours. Cardiac Enzymes: No results for input(s): CKTOTAL, CKMB, CKMBINDEX, TROPONINI in the last 168 hours. BNP (last 3 results) No results for input(s): PROBNP in the last 8760 hours. HbA1C: Recent Labs    05/09/19 0352  HGBA1C 5.6   CBG: Recent Labs  Lab 05/07/19 1152 05/09/19 1634 05/09/19 2142  GLUCAP 237* 85 107*   Lipid Profile: Recent Labs    05/09/19 0352  CHOL 127  HDL 40*  LDLCALC 65  TRIG 110  CHOLHDL 3.2    Thyroid Function Tests: Recent Labs    05/08/19 0855  FREET4 1.10   Anemia Panel: Recent Labs    05/08/19 0855  VITAMINB12 2,022*  FOLATE 10.0  FERRITIN 55  TIBC 294  IRON 56  RETICCTPCT 2.3   Urine analysis:    Component Value Date/Time   COLORURINE COLORLESS (A) 03/25/2018 0440   APPEARANCEUR CLEAR 03/25/2018 0440   LABSPEC 1.005 03/25/2018 0440   PHURINE 5.0 03/25/2018 0440   GLUCOSEU NEGATIVE 03/25/2018 0440   HGBUR NEGATIVE 03/25/2018 0440   BILIRUBINUR NEGATIVE 03/25/2018 0440   KETONESUR NEGATIVE 03/25/2018 0440   PROTEINUR NEGATIVE 03/25/2018 0440   UROBILINOGEN 0.2 12/30/2008 1535   NITRITE NEGATIVE 03/25/2018 0440   LEUKOCYTESUR TRACE (A) 03/25/2018 0440   Sepsis Labs: @LABRCNTIP (procalcitonin:4,lacticidven:4)  ) Recent Results (from the past 240 hour(s))  SARS CORONAVIRUS 2 (TAT 6-24 HRS) Nasopharyngeal Nasopharyngeal Swab     Status: None   Collection Time: 05/06/19 12:31 PM   Specimen: Nasopharyngeal Swab  Result Value Ref Range Status   SARS Coronavirus 2 NEGATIVE NEGATIVE Final    Comment: (NOTE) SARS-CoV-2 target nucleic acids are NOT DETECTED. The SARS-CoV-2 RNA is generally detectable in upper and lower respiratory specimens during the acute phase of infection. Negative results do not preclude SARS-CoV-2 infection, do not rule out co-infections with other pathogens, and should not be used as the sole basis for treatment or other patient management decisions. Negative results must be combined with clinical observations, patient history, and epidemiological information. The expected result is Negative. Fact Sheet for Patients: SugarRoll.be Fact Sheet for Healthcare Providers: https://www.woods-mathews.com/ This test is not yet approved or cleared by the [place] FDA and  has been authorized for detection and/or diagnosis of SARS-CoV-2 by FDA under an Emergency Use Authorization (EUA). This EUA  will remain  in effect (meaning this test can be used) for the duration of the COVID-19 declaration under Section 56 4(b)(1) of the Act, 21 U.S.C. section 360bbb-3(b)(1), unless the authorization is terminated or revoked sooner. Performed at [facility], [address] [place], [place] [address]       Radiology Studies: Ct Head Wo Contrast  Result Date: 05/09/2019 CLINICAL DATA:  Stroke follow-up EXAM: CT HEAD WITHOUT CONTRAST TECHNIQUE: Contiguous axial images were obtained from the base of the skull through the vertex without intravenous contrast. COMPARISON:  CT head May 06, 2019 FINDINGS: Brain: Redemonstration of the hypoattenuating extra-axial CSF fluid collection measuring only 6 mm in maximal thickness over the left frontal convexity. Could reflect chronic subdural or acute or chronic hygroma. No high attenuation blood products are present. Mild left-to-right midline shift approximately 3 mm is grossly stable from comparison study. Stable lacune in the right caudate additional lacune versus prominent perivascular spaces seen in the right putamen. No evidence of acute infarction, hemorrhage, hydrocephalus, extra-axial collection or mass lesion/mass effect. Symmetric prominence of the ventricles, cisterns and sulci compatible with parenchymal volume loss. Patchy areas of white matter hypoattenuation are most compatible with chronic microvascular angiopathy. Vascular: Atherosclerotic calcification of the carotid siphons and intradural vertebral arteries. No hyperdense vessel or dural venous sinus. Skull: No calvarial fracture or suspicious osseous lesion. No scalp swelling or hematoma. Sinuses/Orbits: Paranasal sinuses and mastoid air cells are predominantly clear. Included orbital structures are unremarkable. Other: None IMPRESSION: 1. Unchanged hypoattenuating extra-axial CSF fluid collection over the left frontal convexity, which may reflect chronic subdural versus hygroma. No high  attenuation blood products are present. 2. Unchanged mild left-to-right midline shift approximately 3 mm. 3. No acute intracranial abnormality. Electronically Signed   By: Lovena Le M.D.   On: 05/09/2019 05:14   Mr Angio Head Wo Contrast  Result Date: 05/09/2019 CLINICAL DATA:  Dizziness, falling and weakness. EXAM: MRA NECK WITHOUT  CONTRAST MRA HEAD WITHOUT CONTRAST TECHNIQUE: Multiplanar and multiecho pulse sequences of the neck were obtained without and with intravenous contrast. Angiographic images of the neck were obtained using MRA technique without and with intravenous contast.; Angiographic images of the Circle of Willis were obtained using MRA technique without intravenous contrast. COMPARISON:  MRI yesterday.  CT today. FINDINGS: MRA NECK FINDINGS Both common carotid arteries are patent to the bifurcation. The right common carotid artery is markedly tortuous. Both carotid bifurcations appear widely patent. Cervical internal carotid arteries appear patent. Antegrade flow is seen in both vertebral arteries. The right vertebral artery is dominant. MRA HEAD FINDINGS Both internal carotid arteries are patent through the skull base and siphon regions. The anterior and middle cerebral vessels are patent without proximal stenosis, aneurysm or vascular malformation. Both vertebral arteries are patent at the foramen magnum level. The right vertebral artery is dominant. Flow is seen in both posteroinferior cerebellar arteries. Both vertebral arteries reach basilar. Proximal basilar is patent with flow to both anterior inferior cerebellar arteries. There is occlusion of the midportion of the basilar. There is reconstituted flow in the distal basilar due to large posterior communicating arteries. Both superior cerebellar arteries and posterior cerebral arteries show flow. IMPRESSION: No neck vessel abnormality of significance on this noncontrast neck MRA. Noncontrast study does not assess the brachiocephalic  vessel origins. 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 Large posterior communicating arteries allow supply to the distal basilar branches. Electronically Signed   By: Nelson Chimes M.D.   On: 05/09/2019 20:32   Mr Angio Neck Wo Contrast  Result Date: 05/09/2019 CLINICAL DATA:  Dizziness, falling and weakness. EXAM: MRA NECK WITHOUT  CONTRAST MRA HEAD WITHOUT CONTRAST TECHNIQUE: Multiplanar and multiecho pulse sequences of  the neck were obtained without and with intravenous contrast. Angiographic images of the neck were obtained using MRA technique without and with intravenous contast.; Angiographic images of the Circle of Willis were obtained using MRA technique without intravenous contrast. COMPARISON:  MRI yesterday.  CT today. FINDINGS: MRA NECK FINDINGS Both common carotid arteries are patent to the bifurcation. The right common carotid artery is markedly tortuous. Both carotid bifurcations appear widely patent. Cervical internal carotid arteries appear patent. Antegrade flow is seen in both vertebral arteries. The right vertebral artery is dominant. MRA HEAD FINDINGS Both internal carotid arteries are patent through the skull base and siphon regions. The anterior and middle cerebral vessels are patent without proximal stenosis, aneurysm or vascular malformation. Both vertebral arteries are patent at the foramen magnum level. The right vertebral artery is dominant. Flow is seen in both posteroinferior cerebellar arteries. Both vertebral arteries reach basilar. Proximal basilar is patent with flow to both anterior inferior cerebellar arteries. There is occlusion of the midportion of the basilar. There is reconstituted flow in the distal basilar due to large posterior communicating arteries. Both superior cerebellar arteries and posterior cerebral arteries show flow. IMPRESSION: No neck vessel abnormality of  significance on this noncontrast neck MRA. Noncontrast study does not assess the brachiocephalic vessel origins. 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 Large posterior communicating arteries allow supply to the distal basilar branches. Electronically Signed   By: Nelson Chimes M.D.   On: 05/09/2019 20:32   Mr Brain Wo Contrast  Result Date: 05/08/2019 CLINICAL DATA:  Stroke. EXAM: MRI HEAD WITHOUT CONTRAST TECHNIQUE: Multiplanar, multiecho pulse sequences of the brain and surrounding structures were obtained without intravenous contrast. COMPARISON:  CT head 05/06/2019 FINDINGS: Brain: Acute infarct right paramedian pons Small acute infarct right cerebellum Small acute infarct left posterior putamen Punctate infarct in the right convexity cortex which may be in the postcentral gyrus. Generalized atrophy. Mild chronic microvascular ischemic change in the white matter 7 mm left convexity fluid collection. Fluid is predominately CSF however there are small scattered areas of blood in the fluid. This is causing mass-effect and mild midline shift to the right of 3 mm. Vascular: Normal arterial flow void Skull and upper cervical spine: Negative Sinuses/Orbits: Negative Other: None IMPRESSION: Acute infarcts in the right pons, right cerebellum, left putamen, and right convexity cortex 7 mm thick left hemispheric fluid collection. Fluid is predominately CSF however there are scattered areas of hemorrhage which could be subacute or chronic within the subdural fluid. Mild midline shift to the right. Electronically Signed   By: Franchot Gallo M.D.   On: 05/08/2019 15:39     Scheduled Meds:  apixaban  5 mg Oral BID   atorvastatin  40 mg Oral q1800   furosemide  40 mg Oral Daily   levothyroxine  50 mcg Oral QODAY   levothyroxine  75 mcg Oral QODAY   mouth rinse  15 mL Mouth Rinse BID   sertraline  50 mg Oral QHS    sodium chloride flush  3 mL Intravenous Q12H   vitamin B-12  1,000 mcg Oral Daily   Continuous Infusions:  sodium chloride       LOS: 4 days   Time Spent in minutes   30 minutes  Suprena Travaglini D.O. on 05/10/2019 at 9:57 AM  Between 7am to 7pm - Please see pager noted on [email or web address]  After 7pm go to [email or web address]  And look for the night  coverage person covering for me after hours  [facility] Office  [phone]

## 2019-05-11 DIAGNOSIS — E034 Atrophy of thyroid (acquired): Secondary | ICD-10-CM | POA: Diagnosis not present

## 2019-05-11 DIAGNOSIS — I358 Other nonrheumatic aortic valve disorders: Secondary | ICD-10-CM | POA: Diagnosis not present

## 2019-05-11 DIAGNOSIS — R197 Diarrhea, unspecified: Secondary | ICD-10-CM | POA: Diagnosis not present

## 2019-05-11 DIAGNOSIS — Z111 Encounter for screening for respiratory tuberculosis: Secondary | ICD-10-CM | POA: Diagnosis not present

## 2019-05-11 DIAGNOSIS — D649 Anemia, unspecified: Secondary | ICD-10-CM | POA: Diagnosis not present

## 2019-05-11 DIAGNOSIS — R296 Repeated falls: Secondary | ICD-10-CM | POA: Diagnosis not present

## 2019-05-11 DIAGNOSIS — J9611 Chronic respiratory failure with hypoxia: Secondary | ICD-10-CM | POA: Diagnosis not present

## 2019-05-11 DIAGNOSIS — Z9181 History of falling: Secondary | ICD-10-CM | POA: Diagnosis not present

## 2019-05-11 DIAGNOSIS — R001 Bradycardia, unspecified: Secondary | ICD-10-CM | POA: Diagnosis not present

## 2019-05-11 DIAGNOSIS — I4891 Unspecified atrial fibrillation: Secondary | ICD-10-CM | POA: Diagnosis not present

## 2019-05-11 DIAGNOSIS — I471 Supraventricular tachycardia: Secondary | ICD-10-CM | POA: Diagnosis not present

## 2019-05-11 DIAGNOSIS — W19XXXD Unspecified fall, subsequent encounter: Secondary | ICD-10-CM | POA: Diagnosis not present

## 2019-05-11 DIAGNOSIS — G4700 Insomnia, unspecified: Secondary | ICD-10-CM | POA: Diagnosis not present

## 2019-05-11 DIAGNOSIS — Z7401 Bed confinement status: Secondary | ICD-10-CM | POA: Diagnosis not present

## 2019-05-11 DIAGNOSIS — I69398 Other sequelae of cerebral infarction: Secondary | ICD-10-CM | POA: Diagnosis not present

## 2019-05-11 DIAGNOSIS — I480 Paroxysmal atrial fibrillation: Secondary | ICD-10-CM | POA: Diagnosis not present

## 2019-05-11 DIAGNOSIS — I10 Essential (primary) hypertension: Secondary | ICD-10-CM | POA: Diagnosis not present

## 2019-05-11 DIAGNOSIS — R42 Dizziness and giddiness: Secondary | ICD-10-CM | POA: Diagnosis not present

## 2019-05-11 DIAGNOSIS — I639 Cerebral infarction, unspecified: Secondary | ICD-10-CM | POA: Diagnosis not present

## 2019-05-11 DIAGNOSIS — M2550 Pain in unspecified joint: Secondary | ICD-10-CM | POA: Diagnosis not present

## 2019-05-11 DIAGNOSIS — E876 Hypokalemia: Secondary | ICD-10-CM | POA: Diagnosis not present

## 2019-05-11 DIAGNOSIS — R32 Unspecified urinary incontinence: Secondary | ICD-10-CM | POA: Diagnosis not present

## 2019-05-11 DIAGNOSIS — R2689 Other abnormalities of gait and mobility: Secondary | ICD-10-CM | POA: Diagnosis not present

## 2019-05-11 DIAGNOSIS — I491 Atrial premature depolarization: Secondary | ICD-10-CM | POA: Diagnosis not present

## 2019-05-11 DIAGNOSIS — I959 Hypotension, unspecified: Secondary | ICD-10-CM | POA: Diagnosis not present

## 2019-05-11 DIAGNOSIS — F329 Major depressive disorder, single episode, unspecified: Secondary | ICD-10-CM | POA: Diagnosis not present

## 2019-05-11 DIAGNOSIS — M6281 Muscle weakness (generalized): Secondary | ICD-10-CM | POA: Diagnosis not present

## 2019-05-11 DIAGNOSIS — Z1159 Encounter for screening for other viral diseases: Secondary | ICD-10-CM | POA: Diagnosis not present

## 2019-05-11 DIAGNOSIS — I441 Atrioventricular block, second degree: Secondary | ICD-10-CM | POA: Diagnosis not present

## 2019-05-11 DIAGNOSIS — E039 Hypothyroidism, unspecified: Secondary | ICD-10-CM | POA: Diagnosis not present

## 2019-05-11 DIAGNOSIS — E559 Vitamin D deficiency, unspecified: Secondary | ICD-10-CM | POA: Diagnosis not present

## 2019-05-11 DIAGNOSIS — I5033 Acute on chronic diastolic (congestive) heart failure: Secondary | ICD-10-CM | POA: Diagnosis not present

## 2019-05-11 DIAGNOSIS — R52 Pain, unspecified: Secondary | ICD-10-CM | POA: Diagnosis not present

## 2019-05-11 DIAGNOSIS — K5900 Constipation, unspecified: Secondary | ICD-10-CM | POA: Diagnosis not present

## 2019-05-11 LAB — CBC
HCT: 33.8 % — ABNORMAL LOW (ref 36.0–46.0)
Hemoglobin: 10.8 g/dL — ABNORMAL LOW (ref 12.0–15.0)
MCH: 28.6 pg (ref 26.0–34.0)
MCHC: 32 g/dL (ref 30.0–36.0)
MCV: 89.7 fL (ref 80.0–100.0)
Platelets: 266 10*3/uL (ref 150–400)
RBC: 3.77 MIL/uL — ABNORMAL LOW (ref 3.87–5.11)
RDW: 13.6 % (ref 11.5–15.5)
WBC: 7.8 10*3/uL (ref 4.0–10.5)
nRBC: 0 % (ref 0.0–0.2)

## 2019-05-11 LAB — GLUCOSE, CAPILLARY: Glucose-Capillary: 80 mg/dL (ref 70–99)

## 2019-05-11 LAB — APTT: aPTT: 37 seconds — ABNORMAL HIGH (ref 24–36)

## 2019-05-11 NOTE — Discharge Summary (Signed)
Physician Discharge Summary  Rebecca Norman [id number] DOB: 10/22/1924 DOA: 05/06/2019  PCP: [name], MD  Admit date: 05/06/2019 Discharge date: 05/11/2019  Time spent: 45 minutes  Recommendations for Outpatient Follow-up:  Patient will be discharged to skilled nursing facility, continue physical and occupational therapy.  Patient will need to follow up with primary care provider within one week of discharge.  Follow up with cardiology and neurology. Patient should continue medications as prescribed.  Patient should follow a heart healthy diet.   Discharge Diagnoses:  Acute CVA Dizziness with falls Bradycardia/ paroxysmal atrial fibrillation and atrial tachycardia Acute on chronic diastolic heart failure Chronic respiratory failure with hypoxia Hypothyroidism Chronic normocytic anemia Depression  Discharge Condition: Stable  Diet recommendation: heart healthy  Filed Weights   05/09/19 0500 05/10/19 0426 05/11/19 0641  Weight: 77.1 kg 70.9 kg 74.8 kg    History of present illness:  On 05/06/2019 by Dr. Ena Dawley, Cardiology Ms.[place]fell about a week ago. She did not lose consciousness. She had gotten up to the bathroom, and then had gone into the kitchen. This was about 4 AM. As she headed back to the gym to sit in the recliner, she said her legs got weak and she ended up on the floor. That is her only recent fall.  Ever since then, she has felt weak. She has no complaints of chest pain. She feels her breathing is at baseline. She feels the Lasix is taking care of the edema. She may have a little orthopnea, that is chronic with no recent change. She denies PND. Feels her dyspnea on exertion is at baseline.  Today, when she got up, she felt lightheaded. She was a little dizzy. She had an episode of vomiting. EMS was called and transported her to the emergency room. She was initially hypertensive. To the ER nurse, she reported walking 3 days ago  with a walker and her legs went out, striking the left side of her head. It is unclear if that is the same fall that she described to me in the middle of the night which was a week ago.  She has not had palpitations. If she has been in atrial fibrillation, she is not aware of it. She is compliant with her medications including amiodarone and Eliquis.  She had a telemedicine visit with Dr. [name] on 7/30. No blood pressure or heart rate are recorded from that day. At that visit, labs were ordered which were done 8/25. Her potassium was 5.4. Her K. Dur was discontinued. She was to get a repeat BMET in 1 week.  Currently in the emergency room, she is resting comfortably. On telemetry, she has been in Mobitz 1 heart block, with a heart rate that drops into the 30s at times. She denies feeling lightheaded or dizzy since she has been in the ER, but has not been out of bed.   Hospital Course:  Acute CVA -Patient went dizziness -MRI brain: Acute infarcts in the right pons and cerebellum, left putamen, right convexity cortex -MRA head and neck: No neck vessel abnormality or significance on noncontrast neck MRI.  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  Large posterior communicating arteries allow supply to distal basilar branches. -Echocardiogram obtained showing an EF> 65%.  Mild concentric LV hypertrophy.  LV diastolic Doppler parameters consistent with pseudonormalization.  No regional wall motion abnormalities.  Mitral valve is degenerative.  Moderate mitral annular calcification present.  Mitral valve regurgitation is moderate.  Valve sclerosis without stenosis. -Hemoglobin A1c 5.6, LDL 65 (goal of 70) -PT and OT recommending SNF -Neurology consulted and appreciated, recommended continuing Eliquis.  Pradaxa may be a possibility however insurance co-pay may be too much.  Adding aspirin with did not show any  benefit and may increase risk of bleeding.  Patient to follow-up with neurology. -Continue Eliquis -given LDL 46 and age- will hold off on prescribing statin  Dizziness with falls -Likely related to acute CVA -As above, PT and OT recommending SNF -Social work consulted  Bradycardia/ paroxysmal atrial fibrillation and atrial tachycardia -Noted to be in Mobitz type I AV block -Cardiology initially admitted patient, electrophysiology was consulted and appreciated -Not feel that bradycardia was leading to her dizziness and recommended to avoid AV nodal blocking agents.  Amiodarone was also discontinued.  Pacemaker placement not recommended given that it would not improve patient's quality of life. -Continue Eliquis  Acute on chronic diastolic heart failure -Patient was diuresed with IV Lasix and appears to be euvolemic at this time -Transitioned to oral Lasix, home dose 40 mg daily -Continue to monitor intake and output, daily weights  Chronic respiratory failure with hypoxia -Suspect secondary to heart failure -Patient is on chronic oxygen supplementation at home, and reports being on 4 L over the last several months  Hypothyroidism -TSH 4.530, FT4 1.10 -Continue Synthroid  Chronic normocytic anemia -hemoglobin currently stable -anemia panel obtained, WNL -B12 >2000, hold additional supplementation  Depression -Continue Zoloft  Consultants Cardiology/EP Neurology  Procedures  Echocardiogram  Code status: DNR  Discharge Exam: Vitals:   05/10/19 2006 05/11/19 0641  BP: 95/79 121/76  Pulse: (!) 57 (!) 53  Resp: 18 17  Temp: (!) 97.5 F (36.4 C) 97.7 F (36.5 C)  SpO2: 96% 97%     General: Well developed, well nourished, elderly, NAD  HEENT: NCAT, mucous membranes moist.  Cardiovascular: S1 S2 auscultated, RRR  Respiratory: Clear to auscultation bilaterally   Abdomen: Soft, nontender, nondistended, + bowel sounds  Extremities: warm dry without  cyanosis clubbing  Neuro: AAOx3, mild RLE weakness, hard of heard hearing, otherwise nonfocal  Psych: Normal affect and demeanor    Discharge Instructions Discharge Instructions    Discharge instructions   Complete by: As directed    Patient will be discharged to skilled nursing facility, continue physical and occupational therapy.  Patient will need to follow up with primary care provider within one week of discharge.  Follow up with cardiology and neurology. Patient should continue medications as prescribed.  Patient should follow a heart healthy diet.     Allergies as of 05/11/2019      Reactions   Flagyl [metronidazole] Hives   Lisinopril Cough      Medication List    STOP taking these medications   amiodarone 200 MG tablet Commonly known as: PACERONE   vitamin B-12 1000 MCG tablet Commonly known as: CYANOCOBALAMIN     TAKE these medications   acetaminophen 500 MG tablet Commonly known as: TYLENOL Take 500 mg by mouth every 6 (six) hours as needed for headache (pain).   Eliquis 5 MG Tabs tablet Generic drug: apixaban TAKE 1 TABLET TWICE A DAY What changed: how much to take   furosemide 20 MG tablet Commonly known as: LASIX Take 40 mg (2 tabs) in a.m. And take 20mg  (1 tab) in the PM-PRN What changed:   how much to take  how to take this  when to take this  additional instructions   levothyroxine 50 MCG  tablet Commonly known as: SYNTHROID Take 50-75 mcg by mouth See admin instructions. Take every other alternating between 47mcg and 42mcg.   OXYGEN Inhale 4.5 L into the lungs continuous.   sertraline 50 MG tablet Commonly known as: ZOLOFT Take 50 mg by mouth at bedtime.      Allergies  Allergen Reactions   Flagyl [Metronidazole] Hives   Lisinopril Cough   Follow-up Information    Erlene Quan, PA-C Follow up.   Specialties: Cardiology, Radiology Why: [facility] - see appointment information below for 05/31/19. Lurena Joiner is one of the PAs that  works with Dr. [name]. Contact information: [address] [place] [address] [phone]            The results of significant diagnostics from this hospitalization (including imaging, microbiology, ancillary and laboratory) are listed below for reference.    Significant Diagnostic Studies: Dg Chest 2 View  Result Date: 04/29/2019 CLINICAL DATA:  Amiodarone surveillance EXAM: CHEST - 2 VIEW COMPARISON:  09/21/2018 FINDINGS: Cardiomegaly. Scarring in the lung bases. No acute confluent airspace opacities. No effusions. No acute bony abnormality. Aortic atherosclerosis. IMPRESSION: Cardiomegaly, bibasilar scarring.  No change since prior study. Electronically Signed   By: Rolm Baptise M.D.   On: 04/29/2019 18:18   Ct Head Wo Contrast  Result Date: 05/09/2019 CLINICAL DATA:  Stroke follow-up EXAM: CT HEAD WITHOUT CONTRAST TECHNIQUE: Contiguous axial images were obtained from the base of the skull through the vertex without intravenous contrast. COMPARISON:  CT head May 06, 2019 FINDINGS: Brain: Redemonstration of the hypoattenuating extra-axial CSF fluid collection measuring only 6 mm in maximal thickness over the left frontal convexity. Could reflect chronic subdural or acute or chronic hygroma. No high attenuation blood products are present. Mild left-to-right midline shift approximately 3 mm is grossly stable from comparison study. Stable lacune in the right caudate additional lacune versus prominent perivascular spaces seen in the right putamen. No evidence of acute infarction, hemorrhage, hydrocephalus, extra-axial collection or mass lesion/mass effect. Symmetric prominence of the ventricles, cisterns and sulci compatible with parenchymal volume loss. Patchy areas of white matter hypoattenuation are most compatible with chronic microvascular angiopathy. Vascular: Atherosclerotic calcification of the carotid siphons and intradural vertebral arteries. No hyperdense vessel or  dural venous sinus. Skull: No calvarial fracture or suspicious osseous lesion. No scalp swelling or hematoma. Sinuses/Orbits: Paranasal sinuses and mastoid air cells are predominantly clear. Included orbital structures are unremarkable. Other: None IMPRESSION: 1. Unchanged hypoattenuating extra-axial CSF fluid collection over the left frontal convexity, which may reflect chronic subdural versus hygroma. No high attenuation blood products are present. 2. Unchanged mild left-to-right midline shift approximately 3 mm. 3. No acute intracranial abnormality. Electronically Signed   By: Lovena Le M.D.   On: 05/09/2019 05:14   Ct Head Wo Contrast  Result Date: 05/06/2019 CLINICAL DATA:  Fall.  On anticoagulation. EXAM: CT HEAD WITHOUT CONTRAST TECHNIQUE: Contiguous axial images were obtained from the base of the skull through the vertex without intravenous contrast. COMPARISON:  None. FINDINGS: Brain: Extra-axial CSF fluid collection on the left measures 8 mm in thickness. This could be acute or chronic hygroma. No high density blood products are present. Mild midline shift to the right approximately 3 mm. Generalized atrophy. No acute infarct or hemorrhage. 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 Vascular: Negative for hyperdense vessel Skull: Negative Sinuses/Orbits: Mild mucosal edema paranasal sinuses. Bilateral cataract surgery. Other: None IMPRESSION: 8 mm thick subdural hygroma on the left with  mild midline shift to the right. This could be due to acute or chronic injury. No high density blood products are present. Electronically Signed   By: Franchot Gallo M.D.   On: 05/06/2019 11:36   Mr Angio Head Wo Contrast  Result Date: 05/09/2019 CLINICAL DATA:  Dizziness, falling and weakness. EXAM: MRA NECK WITHOUT  CONTRAST MRA HEAD WITHOUT CONTRAST TECHNIQUE: Multiplanar and multiecho pulse sequences of the neck were obtained without and with intravenous contrast. Angiographic images of the neck  were obtained using MRA technique without and with intravenous contast.; Angiographic images of the Circle of Willis were obtained using MRA technique without intravenous contrast. COMPARISON:  MRI yesterday.  CT today. FINDINGS: MRA NECK FINDINGS Both common carotid arteries are patent to the bifurcation. The right common carotid artery is markedly tortuous. Both carotid bifurcations appear widely patent. Cervical internal carotid arteries appear patent. Antegrade flow is seen in both vertebral arteries. The right vertebral artery is dominant. MRA HEAD FINDINGS Both internal carotid arteries are patent through the skull base and siphon regions. The anterior and middle cerebral vessels are patent without proximal stenosis, aneurysm or vascular malformation. Both vertebral arteries are patent at the foramen magnum level. The right vertebral artery is dominant. Flow is seen in both posteroinferior cerebellar arteries. Both vertebral arteries reach basilar. Proximal basilar is patent with flow to both anterior inferior cerebellar arteries. There is occlusion of the midportion of the basilar. There is reconstituted flow in the distal basilar due to large posterior communicating arteries. Both superior cerebellar arteries and posterior cerebral arteries show flow. IMPRESSION: No neck vessel abnormality of significance on this noncontrast neck MRA. Noncontrast study does not assess the brachiocephalic vessel origins. 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 Large posterior communicating arteries allow supply to the distal basilar branches. Electronically Signed   By: Nelson Chimes M.D.   On: 05/09/2019 20:32   Mr Angio Neck Wo Contrast  Result Date: 05/09/2019 CLINICAL DATA:  Dizziness, falling and weakness. EXAM: MRA NECK WITHOUT  CONTRAST MRA HEAD WITHOUT CONTRAST TECHNIQUE: Multiplanar and multiecho pulse sequences of the  neck were obtained without and with intravenous contrast. Angiographic images of the neck were obtained using MRA technique without and with intravenous contast.; Angiographic images of the Circle of Willis were obtained using MRA technique without intravenous contrast. COMPARISON:  MRI yesterday.  CT today. FINDINGS: MRA NECK FINDINGS Both common carotid arteries are patent to the bifurcation. The right common carotid artery is markedly tortuous. Both carotid bifurcations appear widely patent. Cervical internal carotid arteries appear patent. Antegrade flow is seen in both vertebral arteries. The right vertebral artery is dominant. MRA HEAD FINDINGS Both internal carotid arteries are patent through the skull base and siphon regions. The anterior and middle cerebral vessels are patent without proximal stenosis, aneurysm or vascular malformation. Both vertebral arteries are patent at the foramen magnum level. The right vertebral artery is dominant. Flow is seen in both posteroinferior cerebellar arteries. Both vertebral arteries reach basilar. Proximal basilar is patent with flow to both anterior inferior cerebellar arteries. There is occlusion of the midportion of the basilar. There is reconstituted flow in the distal basilar due to large posterior communicating arteries. Both superior cerebellar arteries and posterior cerebral arteries show flow. IMPRESSION: No neck vessel abnormality of significance on this noncontrast neck MRA. Noncontrast study does not assess the brachiocephalic vessel origins. 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 Large posterior communicating arteries allow supply to the distal basilar branches. Electronically Signed   By: Nelson Chimes M.D.   On: 05/09/2019 20:32   Mr Brain Wo Contrast  Result Date: 05/08/2019 CLINICAL DATA:  Stroke. EXAM: MRI HEAD WITHOUT CONTRAST TECHNIQUE: Multiplanar,  multiecho pulse sequences of the brain and surrounding structures were obtained without intravenous contrast. COMPARISON:  CT head 05/06/2019 FINDINGS: Brain: Acute infarct right paramedian pons Small acute infarct right cerebellum Small acute infarct left posterior putamen Punctate infarct in the right convexity cortex which may be in the postcentral gyrus. Generalized atrophy. Mild chronic microvascular ischemic change in the white matter 7 mm left convexity fluid collection. Fluid is predominately CSF however there are small scattered areas of blood in the fluid. This is causing mass-effect and mild midline shift to the right of 3 mm. Vascular: Normal arterial flow void Skull and upper cervical spine: Negative Sinuses/Orbits: Negative Other: None IMPRESSION: Acute infarcts in the right pons, right cerebellum, left putamen, and right convexity cortex 7 mm thick left hemispheric fluid collection. Fluid is predominately CSF however there are scattered areas of hemorrhage which could be subacute or chronic within the subdural fluid. Mild midline shift to the right. Electronically Signed   By: Franchot Gallo M.D.   On: 05/08/2019 15:39   Dg Chest Portable 1 View  Result Date: 05/06/2019 CLINICAL DATA:  Shortness of breath EXAM: PORTABLE CHEST 1 VIEW COMPARISON:  04/29/2019 FINDINGS: Mild cardiomegaly. Aortic calcified and tortuous. Mild pulmonary vascular congestion with thickened interstitial markings. Probable small bilateral pleural effusions. No pneumothorax. IMPRESSION: Findings suggestive of CHF with interstitial edema and small bilateral pleural effusions. Electronically Signed   By: Davina Poke M.D.   On: 05/06/2019 11:16    Microbiology: Recent Results (from the past 240 hour(s))  SARS CORONAVIRUS 2 (TAT 6-24 HRS) Nasopharyngeal Nasopharyngeal Swab     Status: None   Collection Time: 05/06/19 12:31 PM   Specimen: Nasopharyngeal Swab  Result Value Ref Range Status   SARS Coronavirus 2  NEGATIVE NEGATIVE Final    Comment: (NOTE) SARS-CoV-2 target nucleic acids are NOT DETECTED. The SARS-CoV-2 RNA is generally detectable in upper and lower respiratory specimens during the acute phase of infection. Negative results do not preclude SARS-CoV-2 infection, do not rule out co-infections with other pathogens, and should not be used as the sole basis for treatment or other patient management decisions. Negative results must be combined with clinical observations, patient history, and epidemiological information. The expected result is Negative. Fact Sheet for Patients: SugarRoll.be Fact Sheet for Healthcare Providers: https://www.woods-mathews.com/ This test is not yet approved or cleared by the [place] FDA and  has been authorized for detection and/or diagnosis of SARS-CoV-2 by FDA under an Emergency Use Authorization (EUA). This EUA will remain  in effect (meaning this test can be used) for the duration of the COVID-19 declaration under Section 56 4(b)(1) of the Act, 21 U.S.C. section 360bbb-3(b)(1), unless the authorization is terminated or revoked sooner. Performed at [facility], [place] [address] [place], [place] [address]   SARS CORONAVIRUS 2 (TAT 6-24 HRS)     Status: None   Collection Time: 05/10/19 11:04 AM  Result Value Ref Range Status   SARS Coronavirus 2 NEGATIVE NEGATIVE Final    Comment: (NOTE) SARS-CoV-2 target nucleic acids are NOT DETECTED. The SARS-CoV-2 RNA is generally detectable in upper and lower respiratory specimens during the acute phase of infection. Negative results do not preclude SARS-CoV-2  infection, do not rule out co-infections with other pathogens, and should not be used as the sole basis for treatment or other patient management decisions. Negative results must be combined with clinical observations, patient history, and epidemiological information. The expected result is  Negative. Fact Sheet for Patients: SugarRoll.be Fact Sheet for Healthcare Providers: https://www.woods-mathews.com/ This test is not yet approved or cleared by the [place] FDA and  has been authorized for detection and/or diagnosis of SARS-CoV-2 by FDA under an Emergency Use Authorization (EUA). This EUA will remain  in effect (meaning this test can be used) for the duration of the COVID-19 declaration under Section 56 4(b)(1) of the Act, 21 U.S.C. section 360bbb-3(b)(1), unless the authorization is terminated or revoked sooner. Performed at [facility], [place] [address] [place], [place] [address]      Labs: Basic Metabolic Panel: Recent Labs  Lab 05/06/19 1053  05/06/19 1840 05/07/19 0434 05/08/19 0855 05/09/19 0352 05/10/19 0415  NA 136  --   --  137 136 136 135  K 4.2  --   --  3.4* 4.0 4.6 4.8  CL 100  --   --  99 95* 97* 97*  CO2 26  --   --  26 29 26 30   GLUCOSE 98  --   --  72 127* 85 87  BUN 16  --   --  17 17 26* 24*  CREATININE 0.93   < > 0.83 0.91 1.09* 1.03* 0.93  CALCIUM 8.4*  --   --  8.3* 8.9 8.7* 9.0  MG 1.9  --   --   --   --   --   --    < > = values in this interval not displayed.   Liver Function Tests: Recent Labs  Lab 05/07/19 0434  AST 11*  ALT 10  ALKPHOS 62  BILITOT 0.5  PROT 5.3*  ALBUMIN 2.6*   No results for input(s): LIPASE, AMYLASE in the last 168 hours. No results for input(s): AMMONIA in the last 168 hours. CBC: Recent Labs  Lab 05/06/19 1053 05/07/19 0434 05/08/19 0443 05/09/19 0352 05/10/19 0415 05/11/19 0458  WBC 8.1 5.9 6.7 5.9 6.9 7.8  NEUTROABS 6.7  --   --   --   --   --   HGB 10.7* 10.2* 10.6* 10.7* 11.0* 10.8*  HCT 34.6* 31.4* 33.0* 33.9* 34.4* 33.8*  MCV 93.0 90.0 89.7 91.9 90.5 89.7  PLT 279 247 263 249 251 266   Cardiac Enzymes: No results for input(s): CKTOTAL, CKMB, CKMBINDEX, TROPONINI in the last 168 hours. BNP: BNP (last 3 results) Recent Labs     05/06/19 1053  BNP 1,032.4*    ProBNP (last 3 results) No results for input(s): PROBNP in the last 8760 hours.  CBG: Recent Labs  Lab 05/07/19 1152 05/09/19 1634 05/09/19 2142 05/10/19 2102 05/11/19 0745  GLUCAP 237* 85 107* 111* 80       Signed:  Manha Amato  [facility] 05/11/2019, 8:52 AM

## 2019-05-11 NOTE — TOC Transition Note (Signed)
Transition of Care [facility]) - CM/SW Discharge Note   Patient Details  Name: Rebecca Norman MRN: [id number] Date of Birth: August 22, 1925  [facility] of [facility]) CM/SW Contact:  Wende Neighbors, LCSW Phone Number: 05/11/2019, 10:40 AM   Clinical Narrative:   Patient will discharge to [facility] after lunch at the rehab request. Patients daughter aware of discharge to rehab. RN to please call [phone] (rm# [id number]) for report. [facility] has been called and transport set for 12:30pm pick up     Final next level of care: [place] Barriers to Discharge: No Barriers Identified   Patient Goals and CMS Choice Patient states their goals for this hospitalization and ongoing recovery are:: to get better      Discharge Placement              Patient chooses bed at: [facility] Patient to be transferred to facility by: [facility] Name of family member notified: spoke with daughter [name] Patient and family notified of of transfer: 05/11/19  Discharge Plan and Services In-house Referral: NA   Post Acute Care Choice: NA                               Social Determinants of Health (SDOH) Interventions     Readmission Risk Interventions No flowsheet data found.

## 2019-05-13 DIAGNOSIS — I69398 Other sequelae of cerebral infarction: Secondary | ICD-10-CM | POA: Diagnosis not present

## 2019-05-13 DIAGNOSIS — R296 Repeated falls: Secondary | ICD-10-CM | POA: Diagnosis not present

## 2019-05-13 DIAGNOSIS — I4891 Unspecified atrial fibrillation: Secondary | ICD-10-CM | POA: Diagnosis not present

## 2019-05-13 DIAGNOSIS — I5033 Acute on chronic diastolic (congestive) heart failure: Secondary | ICD-10-CM | POA: Diagnosis not present

## 2019-05-13 DIAGNOSIS — R32 Unspecified urinary incontinence: Secondary | ICD-10-CM | POA: Diagnosis not present

## 2019-05-20 DIAGNOSIS — I5033 Acute on chronic diastolic (congestive) heart failure: Secondary | ICD-10-CM | POA: Diagnosis not present

## 2019-05-20 DIAGNOSIS — I4891 Unspecified atrial fibrillation: Secondary | ICD-10-CM | POA: Diagnosis not present

## 2019-05-20 DIAGNOSIS — I69398 Other sequelae of cerebral infarction: Secondary | ICD-10-CM | POA: Diagnosis not present

## 2019-05-20 DIAGNOSIS — R296 Repeated falls: Secondary | ICD-10-CM | POA: Diagnosis not present

## 2019-05-20 DIAGNOSIS — R32 Unspecified urinary incontinence: Secondary | ICD-10-CM | POA: Diagnosis not present

## 2019-05-22 DIAGNOSIS — I69398 Other sequelae of cerebral infarction: Secondary | ICD-10-CM | POA: Diagnosis not present

## 2019-05-22 DIAGNOSIS — I4891 Unspecified atrial fibrillation: Secondary | ICD-10-CM | POA: Diagnosis not present

## 2019-05-22 DIAGNOSIS — I5033 Acute on chronic diastolic (congestive) heart failure: Secondary | ICD-10-CM | POA: Diagnosis not present

## 2019-05-22 DIAGNOSIS — R296 Repeated falls: Secondary | ICD-10-CM | POA: Diagnosis not present

## 2019-05-22 DIAGNOSIS — R32 Unspecified urinary incontinence: Secondary | ICD-10-CM | POA: Diagnosis not present

## 2019-05-24 DIAGNOSIS — G4700 Insomnia, unspecified: Secondary | ICD-10-CM | POA: Diagnosis not present

## 2019-05-24 DIAGNOSIS — I5033 Acute on chronic diastolic (congestive) heart failure: Secondary | ICD-10-CM | POA: Diagnosis not present

## 2019-05-24 DIAGNOSIS — R32 Unspecified urinary incontinence: Secondary | ICD-10-CM | POA: Diagnosis not present

## 2019-05-24 DIAGNOSIS — R197 Diarrhea, unspecified: Secondary | ICD-10-CM | POA: Diagnosis not present

## 2019-05-24 DIAGNOSIS — I4891 Unspecified atrial fibrillation: Secondary | ICD-10-CM | POA: Diagnosis not present

## 2019-05-24 DIAGNOSIS — I69398 Other sequelae of cerebral infarction: Secondary | ICD-10-CM | POA: Diagnosis not present

## 2019-05-24 DIAGNOSIS — R296 Repeated falls: Secondary | ICD-10-CM | POA: Diagnosis not present

## 2019-05-27 DIAGNOSIS — G4700 Insomnia, unspecified: Secondary | ICD-10-CM | POA: Diagnosis not present

## 2019-05-27 DIAGNOSIS — I69398 Other sequelae of cerebral infarction: Secondary | ICD-10-CM | POA: Diagnosis not present

## 2019-05-27 DIAGNOSIS — R32 Unspecified urinary incontinence: Secondary | ICD-10-CM | POA: Diagnosis not present

## 2019-05-27 DIAGNOSIS — I4891 Unspecified atrial fibrillation: Secondary | ICD-10-CM | POA: Diagnosis not present

## 2019-05-27 DIAGNOSIS — I5033 Acute on chronic diastolic (congestive) heart failure: Secondary | ICD-10-CM | POA: Diagnosis not present

## 2019-05-27 DIAGNOSIS — R296 Repeated falls: Secondary | ICD-10-CM | POA: Diagnosis not present

## 2019-05-27 DIAGNOSIS — R197 Diarrhea, unspecified: Secondary | ICD-10-CM | POA: Diagnosis not present

## 2019-05-31 ENCOUNTER — Ambulatory Visit (INDEPENDENT_AMBULATORY_CARE_PROVIDER_SITE_OTHER): Payer: Medicare Other | Admitting: Cardiology

## 2019-05-31 ENCOUNTER — Other Ambulatory Visit: Payer: Self-pay

## 2019-05-31 ENCOUNTER — Encounter: Payer: Self-pay | Admitting: Cardiology

## 2019-05-31 DIAGNOSIS — R001 Bradycardia, unspecified: Secondary | ICD-10-CM

## 2019-05-31 DIAGNOSIS — I10 Essential (primary) hypertension: Secondary | ICD-10-CM | POA: Diagnosis not present

## 2019-05-31 DIAGNOSIS — Z7901 Long term (current) use of anticoagulants: Secondary | ICD-10-CM

## 2019-05-31 DIAGNOSIS — I639 Cerebral infarction, unspecified: Secondary | ICD-10-CM | POA: Diagnosis not present

## 2019-05-31 DIAGNOSIS — I480 Paroxysmal atrial fibrillation: Secondary | ICD-10-CM

## 2019-05-31 DIAGNOSIS — I5032 Chronic diastolic (congestive) heart failure: Secondary | ICD-10-CM

## 2019-05-31 MED ORDER — FUROSEMIDE 20 MG PO TABS: 20.0000 mg | ORAL_TABLET | Freq: Every day | ORAL | 0 refills | Status: DC

## 2019-05-31 NOTE — Assessment & Plan Note (Signed)
CHADS VASC=5- on Eliquis 5 mg BID

## 2019-05-31 NOTE — Progress Notes (Signed)
Cardiology Office Note:    Date:  05/31/2019   ID:  Rebecca Norman, DOB 08/18/1925, MRN [id number]  PCP:  [name], MD  Cardiologist:  Kirk Ruths, MD  Electrophysiologist:  None   Referring MD: [name], MD   Chief Complaint  Patient presents with  . Follow-up    Post hospital.    History of Present Illness:    Rebecca Norman is a 85 y.o. female with a hx of PAF, previously maintained on amiodarone and Eliquis.  She was admitted 05/08/2019 with generalized weakness and a history of falls.  On admission her heart rate was thought to be in the 40s, on review it was felt that her heart rhythm was normal sinus rhythm in the 50s with PACs and blocked PACs.  It was not felt that that was the cause of her weakness and was recommended a neurology consult be obtained.  An MRI was done which did show acute infarcts in the right pons, right cerebellum, right cortex and left putamen.  There were scattered areas of hemorrhage which could be subacute versus chronic.  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 The patient was monitored on telemetry and continued to have bradycardia.  It was ultimately decided to stop the patient's amiodarone.  She was discharged to [facility].  She is not made significant improvement since discharge, she still complains of weakness.  Her daughter accompanied her, her daughter is a former Therapist, sports.  Past Medical History:  Diagnosis Date  . DDD (degenerative disc disease), cervical    severe/  auto fusion c2-c5  . Diverticulosis   . Hemorrhoids   . History of adenomatous polyp of colon    1995  adenomatous polyp/  1997 & 2003  hyperplastic polyp's  . History of bladder cancer    s/p  TURBT's, urologist-  Dr [name]  . History of cancer of ureter   . Hypertension   . Hypothyroid   . Nocturia   . Osteoarthritis   . Osteopenia   . Symptomatic bradycardia 05/06/2019    Past Surgical History:  Procedure Laterality Date  . BACK SURGERY    .  CARDIOVERSION N/A 08/09/2017   Procedure: CARDIOVERSION;  Surgeon: Larey Dresser, MD;  Location: [facility] ENDOSCOPY;  Service: Cardiovascular;  Laterality: N/A;  . CATARACT EXTRACTION W/ INTRAOCULAR LENS  IMPLANT, BILATERAL Bilateral   . CYSTOSCOPY W/ RETROGRADES  10/03/2011   Procedure: CYSTOSCOPY WITH RETROGRADE PYELOGRAM;  Surgeon: Molli Hazard, MD;  Location: [facility];  Service: Urology;;  . Consuela Mimes W/ RETROGRADES Bilateral 06/17/2016   Procedure: CYSTOSCOPY WITH RETROGRADE PYELOGRAM;  Surgeon: Alexis Frock, MD;  Location: [facility];  Service: Urology;  Laterality: Bilateral;  . CYSTOSCOPY WITH BIOPSY N/A 06/17/2016   Procedure: CYSTOSCOPY WITH BIOPSY AND FULGERATION;  Surgeon: Alexis Frock, MD;  Location[facility];  Service: Urology;  Laterality: N/A;  . LAMINECTOMY  2009   L3 - 4  . ORIF ANKLE FRACTURE Left 08/28/2017   Procedure: OPEN REDUCTION INTERNAL FIXATION (ORIF) ANKLE FRACTURE;  Surgeon: Shona Needles, MD;  Location: [facility];  Service: Orthopedics;  Laterality: Left;  . TRANSURETHRAL RESECTION OF BLADDER  x3 --  2009; 2010; 06-29-2010  . TRANSURETHRAL RESECTION OF BLADDER TUMOR  10/03/2011   Procedure: TRANSURETHRAL RESECTION OF BLADDER TUMOR (TURBT);  Surgeon: Molli Hazard, MD;  Location: [facility];  Service: Urology;  Laterality: N/A;    Current Medications: Current Meds  Medication  Sig  . acetaminophen (TYLENOL) 500 MG tablet Take 500 mg by mouth every 6 (six) hours as needed for headache (pain).   Marland Kitchen ELIQUIS 5 MG TABS tablet TAKE 1 TABLET TWICE A DAY (Patient taking differently: Take 5 mg by mouth 2 (two) times daily. )  . furosemide (LASIX) 20 MG tablet Take 1 tablet (20 mg total) by mouth daily.  Marland Kitchen levothyroxine (SYNTHROID, LEVOTHROID) 50 MCG tablet Take 50-75 mcg by mouth See admin instructions. Take every other alternating between 37mcg and 67mcg.  . OXYGEN Inhale 4.5 L into the  lungs continuous.   . sertraline (ZOLOFT) 50 MG tablet Take 50 mg by mouth at bedtime.   . [DISCONTINUED] furosemide (LASIX) 20 MG tablet Take 40 mg (2 tabs) in a.m. And take 20mg  (1 tab) in the PM-PRN (Patient taking differently: Take 40 mg by mouth every morning. )     Allergies:   Flagyl [metronidazole] and Lisinopril   Social History   Socioeconomic History  . Marital status: Widowed    Spouse name: Not on file  . Number of children: Not on file  . Years of education: Not on file  . Highest education level: Not on file  Occupational History  . Occupation: Retired  Scientific laboratory technician  . Financial resource strain: Not on file  . Food insecurity    Worry: Not on file    Inability: Not on file  . Transportation needs    Medical: Not on file    Non-medical: Not on file  Tobacco Use  . Smoking status: Former Smoker    Years: 10.00    Types: Cigarettes    Quit date: 09/25/2005    Years since quitting: 13.6  . Smokeless tobacco: Never Used  Substance and Sexual Activity  . Alcohol use: Yes  . Drug use: No  . Sexual activity: Not on file  Lifestyle  . Physical activity    Days per week: Not on file    Minutes per session: Not on file  . Stress: Not on file  Relationships  . Social Herbalist on phone: Not on file    Gets together: Not on file    Attends religious service: Not on file    Active member of club or organization: Not on file    Attends meetings of clubs or organizations: Not on file    Relationship status: Not on file  Other Topics Concern  . Not on file  Social History Narrative   She lives alone in [place].  Daughter helps in her care.     Family History: The patient's family history includes Breast cancer in her sister; Hernia (age of onset: 28) in her sister; Stomach cancer in her sister. There is no history of Colon cancer.  ROS:   Please see the history of present illness.     All other systems reviewed and are  negative.  EKGs/Labs/Other Studies Reviewed:    The following studies were reviewed today: MRI 05/18/19  EKG:  EKG is ordered today.  The ekg ordered today demonstrates NSR 56  Recent Labs: 04/29/2019: TSH 4.530 05/06/2019: B Natriuretic Peptide 1,032.4; Magnesium 1.9 05/07/2019: ALT 10 05/10/2019: BUN 24; Creatinine, Ser 0.93; Potassium 4.8; Sodium 135 05/11/2019: Hemoglobin 10.8; Platelets 266  Recent Lipid Panel    Component Value Date/Time   CHOL 127 May 18, 2019 0352   TRIG 110 2019-05-18 0352   HDL 40 (L) 05-18-2019 0352   CHOLHDL 3.2 May 18, 2019 0352   VLDL 22 2019/05/18  0352   LDLCALC 65 05/09/2019 0352    Physical Exam:    VS:  BP 132/60 (BP Location: Left Arm, Patient Position: Sitting, Cuff Size: Normal)   Pulse (!) 56   Temp (!) 97.2 F (36.2 C)   Ht 5\' 3"  (1.6 m)   Wt 166 lb (75.3 kg)   BMI 29.41 kg/m     Wt Readings from Last 3 Encounters:  05/31/19 166 lb (75.3 kg)  05/11/19 164 lb 12.8 oz (74.8 kg)  04/04/19 168 lb (76.2 kg)     GEN: Elderly female in wheel chair, in no acute distress, on portable O2 HEENT: Normal NECK: No JVD; No carotid bruits LYMPHATICS: No lymphadenopathy CARDIAC: RRR, no murmurs, rubs, gallops RESPIRATORY:  Clear to auscultation without rales, wheezing or rhonchi  ABDOMEN: Soft, non-tender, non-distended MUSCULOSKELETAL:  No edema; No deformity  SKIN: Warm and dry NEUROLOGIC:  Alert and oriented x 3 PSYCHIATRIC:  Normal affect   ASSESSMENT:    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  Amiodarone was discontinued. HR 56-NSR today  Chronic diastolic CHF (congestive heart failure) (HCC) Stable-she is only taking Lasix 20 mg daily- BMP drawn by [facility]   Paroxysmal atrial fibrillation (HCC) Previously on Amiodarone-holding NSR so far  Current use of long term  anticoagulation CHADS VASC=5- on Eliquis 5 mg BID  PLAN:    No change in Rx- f/u with EKG in two months.  I will defer renal function f/u to staff at [facility] who drew her BMP last week.    Medication Adjustments/Labs and Tests Ordered: Current medicines are reviewed at length with the patient today.  Concerns regarding medicines are outlined above.  Orders Placed This Encounter  Procedures  . EKG 12-Lead   Meds ordered this encounter  Medications  . furosemide (LASIX) 20 MG tablet    Sig: Take 1 tablet (20 mg total) by mouth daily.    Dispense:  90 tablet    Refill:  0    Patient Instructions  Medication Instructions:  Your physician recommends that you continue on your current medications as directed. Please refer to the Current Medication list given to you today. If you need a refill on your cardiac medications before your next appointment, please call your pharmacy.   Lab work: None  If you have labs (blood work) drawn today and your tests are completely normal, you will receive your results only by: Marland Kitchen MyChart Message (if you have MyChart) OR . A paper copy in the mail If you have any lab test that is abnormal or we need to change your treatment, we will call you to review the results.  Testing/Procedures: None   Follow-Up: At [facility], you and your health needs are our priority.  As part of our continuing mission to provide you with exceptional heart care, we have created designated Provider Care Teams.  These Care Teams include your primary Cardiologist (physician) and Advanced Practice Providers (APPs -  Physician Assistants and Nurse Practitioners) who all work together to provide you with the care you need, when you need it.  Your physician recommends that you schedule a follow-up appointment in: 2 months with Kerin Ransom, PA-C  Any Other Special Instructions Will Be Listed Below (If Applicable).      Angelena Form, PA-C  05/31/2019 3:55 PM     [facility] 

## 2019-05-31 NOTE — Assessment & Plan Note (Signed)
Noted bradycardia on admission 05/08/2019- not felt to be severe enough for pacemaker.  Amiodarone was discontinued. HR 56-NSR today

## 2019-05-31 NOTE — Patient Instructions (Signed)
Medication Instructions:  Your physician recommends that you continue on your current medications as directed. Please refer to the Current Medication list given to you today. If you need a refill on your cardiac medications before your next appointment, please call your pharmacy.   Lab work: None  If you have labs (blood work) drawn today and your tests are completely normal, you will receive your results only by: Marland Kitchen MyChart Message (if you have MyChart) OR . A paper copy in the mail If you have any lab test that is abnormal or we need to change your treatment, we will call you to review the results.  Testing/Procedures: None   Follow-Up: At [facility], you and your health needs are our priority.  As part of our continuing mission to provide you with exceptional heart care, we have created designated Provider Care Teams.  These Care Teams include your primary Cardiologist (physician) and Advanced Practice Providers (APPs -  Physician Assistants and Nurse Practitioners) who all work together to provide you with the care you need, when you need it.  Your physician recommends that you schedule a follow-up appointment in: 2 months with Kerin Ransom, PA-C  Any Other Special Instructions Will Be Listed Below (If Applicable).

## 2019-05-31 NOTE — Assessment & Plan Note (Signed)
Previously on Amiodarone-holding NSR so far

## 2019-05-31 NOTE — Assessment & Plan Note (Addendum)
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

## 2019-05-31 NOTE — Assessment & Plan Note (Signed)
Stable-she is only taking Lasix 20 mg daily- BMP drawn by [facility]

## 2019-06-04 DIAGNOSIS — I350 Nonrheumatic aortic (valve) stenosis: Secondary | ICD-10-CM | POA: Diagnosis not present

## 2019-06-04 DIAGNOSIS — F329 Major depressive disorder, single episode, unspecified: Secondary | ICD-10-CM | POA: Diagnosis not present

## 2019-06-04 DIAGNOSIS — M5030 Other cervical disc degeneration, unspecified cervical region: Secondary | ICD-10-CM | POA: Diagnosis not present

## 2019-06-04 DIAGNOSIS — R29898 Other symptoms and signs involving the musculoskeletal system: Secondary | ICD-10-CM | POA: Diagnosis not present

## 2019-06-04 DIAGNOSIS — M8580 Other specified disorders of bone density and structure, unspecified site: Secondary | ICD-10-CM | POA: Diagnosis not present

## 2019-06-04 DIAGNOSIS — I110 Hypertensive heart disease with heart failure: Secondary | ICD-10-CM | POA: Diagnosis not present

## 2019-06-04 DIAGNOSIS — Z7901 Long term (current) use of anticoagulants: Secondary | ICD-10-CM | POA: Diagnosis not present

## 2019-06-04 DIAGNOSIS — Z86010 Personal history of colonic polyps: Secondary | ICD-10-CM | POA: Diagnosis not present

## 2019-06-04 DIAGNOSIS — D649 Anemia, unspecified: Secondary | ICD-10-CM | POA: Diagnosis not present

## 2019-06-04 DIAGNOSIS — Z9181 History of falling: Secondary | ICD-10-CM | POA: Diagnosis not present

## 2019-06-04 DIAGNOSIS — Z87891 Personal history of nicotine dependence: Secondary | ICD-10-CM | POA: Diagnosis not present

## 2019-06-04 DIAGNOSIS — E039 Hypothyroidism, unspecified: Secondary | ICD-10-CM | POA: Diagnosis not present

## 2019-06-04 DIAGNOSIS — I441 Atrioventricular block, second degree: Secondary | ICD-10-CM | POA: Diagnosis not present

## 2019-06-04 DIAGNOSIS — F419 Anxiety disorder, unspecified: Secondary | ICD-10-CM | POA: Diagnosis not present

## 2019-06-04 DIAGNOSIS — I69398 Other sequelae of cerebral infarction: Secondary | ICD-10-CM | POA: Diagnosis not present

## 2019-06-04 DIAGNOSIS — I480 Paroxysmal atrial fibrillation: Secondary | ICD-10-CM | POA: Diagnosis not present

## 2019-06-04 DIAGNOSIS — G8929 Other chronic pain: Secondary | ICD-10-CM | POA: Diagnosis not present

## 2019-06-04 DIAGNOSIS — Z8551 Personal history of malignant neoplasm of bladder: Secondary | ICD-10-CM | POA: Diagnosis not present

## 2019-06-04 DIAGNOSIS — Z8554 Personal history of malignant neoplasm of ureter: Secondary | ICD-10-CM | POA: Diagnosis not present

## 2019-06-04 DIAGNOSIS — I5033 Acute on chronic diastolic (congestive) heart failure: Secondary | ICD-10-CM | POA: Diagnosis not present

## 2019-06-04 DIAGNOSIS — Z9981 Dependence on supplemental oxygen: Secondary | ICD-10-CM | POA: Diagnosis not present

## 2019-06-05 DIAGNOSIS — Z23 Encounter for immunization: Secondary | ICD-10-CM | POA: Diagnosis not present

## 2019-06-10 DIAGNOSIS — I5030 Unspecified diastolic (congestive) heart failure: Secondary | ICD-10-CM | POA: Diagnosis not present

## 2019-06-10 DIAGNOSIS — D6869 Other thrombophilia: Secondary | ICD-10-CM | POA: Diagnosis not present

## 2019-06-10 DIAGNOSIS — Z7189 Other specified counseling: Secondary | ICD-10-CM | POA: Diagnosis not present

## 2019-06-10 DIAGNOSIS — F322 Major depressive disorder, single episode, severe without psychotic features: Secondary | ICD-10-CM | POA: Diagnosis not present

## 2019-06-10 DIAGNOSIS — I639 Cerebral infarction, unspecified: Secondary | ICD-10-CM | POA: Diagnosis not present

## 2019-06-10 DIAGNOSIS — R32 Unspecified urinary incontinence: Secondary | ICD-10-CM | POA: Diagnosis not present

## 2019-06-10 DIAGNOSIS — I4891 Unspecified atrial fibrillation: Secondary | ICD-10-CM | POA: Diagnosis not present

## 2019-06-11 DIAGNOSIS — R29898 Other symptoms and signs involving the musculoskeletal system: Secondary | ICD-10-CM | POA: Diagnosis not present

## 2019-06-11 DIAGNOSIS — I480 Paroxysmal atrial fibrillation: Secondary | ICD-10-CM | POA: Diagnosis not present

## 2019-06-11 DIAGNOSIS — I441 Atrioventricular block, second degree: Secondary | ICD-10-CM | POA: Diagnosis not present

## 2019-06-11 DIAGNOSIS — I69398 Other sequelae of cerebral infarction: Secondary | ICD-10-CM | POA: Diagnosis not present

## 2019-06-11 DIAGNOSIS — I5033 Acute on chronic diastolic (congestive) heart failure: Secondary | ICD-10-CM | POA: Diagnosis not present

## 2019-06-11 DIAGNOSIS — I110 Hypertensive heart disease with heart failure: Secondary | ICD-10-CM | POA: Diagnosis not present

## 2019-06-12 DIAGNOSIS — I69398 Other sequelae of cerebral infarction: Secondary | ICD-10-CM | POA: Diagnosis not present

## 2019-06-12 DIAGNOSIS — R29898 Other symptoms and signs involving the musculoskeletal system: Secondary | ICD-10-CM | POA: Diagnosis not present

## 2019-06-12 DIAGNOSIS — I110 Hypertensive heart disease with heart failure: Secondary | ICD-10-CM | POA: Diagnosis not present

## 2019-06-12 DIAGNOSIS — I480 Paroxysmal atrial fibrillation: Secondary | ICD-10-CM | POA: Diagnosis not present

## 2019-06-12 DIAGNOSIS — I5033 Acute on chronic diastolic (congestive) heart failure: Secondary | ICD-10-CM | POA: Diagnosis not present

## 2019-06-12 DIAGNOSIS — I441 Atrioventricular block, second degree: Secondary | ICD-10-CM | POA: Diagnosis not present

## 2019-06-14 DIAGNOSIS — I5033 Acute on chronic diastolic (congestive) heart failure: Secondary | ICD-10-CM | POA: Diagnosis not present

## 2019-06-14 DIAGNOSIS — I69398 Other sequelae of cerebral infarction: Secondary | ICD-10-CM | POA: Diagnosis not present

## 2019-06-14 DIAGNOSIS — I480 Paroxysmal atrial fibrillation: Secondary | ICD-10-CM | POA: Diagnosis not present

## 2019-06-14 DIAGNOSIS — I110 Hypertensive heart disease with heart failure: Secondary | ICD-10-CM | POA: Diagnosis not present

## 2019-06-14 DIAGNOSIS — R29898 Other symptoms and signs involving the musculoskeletal system: Secondary | ICD-10-CM | POA: Diagnosis not present

## 2019-06-14 DIAGNOSIS — I441 Atrioventricular block, second degree: Secondary | ICD-10-CM | POA: Diagnosis not present

## 2019-06-15 DIAGNOSIS — I69398 Other sequelae of cerebral infarction: Secondary | ICD-10-CM | POA: Diagnosis not present

## 2019-06-15 DIAGNOSIS — I4891 Unspecified atrial fibrillation: Secondary | ICD-10-CM | POA: Diagnosis not present

## 2019-06-15 DIAGNOSIS — I441 Atrioventricular block, second degree: Secondary | ICD-10-CM | POA: Diagnosis not present

## 2019-06-15 DIAGNOSIS — I5030 Unspecified diastolic (congestive) heart failure: Secondary | ICD-10-CM | POA: Diagnosis not present

## 2019-06-15 DIAGNOSIS — R29898 Other symptoms and signs involving the musculoskeletal system: Secondary | ICD-10-CM | POA: Diagnosis not present

## 2019-06-15 DIAGNOSIS — I480 Paroxysmal atrial fibrillation: Secondary | ICD-10-CM | POA: Diagnosis not present

## 2019-06-15 DIAGNOSIS — I5033 Acute on chronic diastolic (congestive) heart failure: Secondary | ICD-10-CM | POA: Diagnosis not present

## 2019-06-15 DIAGNOSIS — I110 Hypertensive heart disease with heart failure: Secondary | ICD-10-CM | POA: Diagnosis not present

## 2019-06-18 DIAGNOSIS — I69398 Other sequelae of cerebral infarction: Secondary | ICD-10-CM | POA: Diagnosis not present

## 2019-06-18 DIAGNOSIS — I480 Paroxysmal atrial fibrillation: Secondary | ICD-10-CM | POA: Diagnosis not present

## 2019-06-18 DIAGNOSIS — I5033 Acute on chronic diastolic (congestive) heart failure: Secondary | ICD-10-CM | POA: Diagnosis not present

## 2019-06-18 DIAGNOSIS — R29898 Other symptoms and signs involving the musculoskeletal system: Secondary | ICD-10-CM | POA: Diagnosis not present

## 2019-06-18 DIAGNOSIS — I441 Atrioventricular block, second degree: Secondary | ICD-10-CM | POA: Diagnosis not present

## 2019-06-18 DIAGNOSIS — I110 Hypertensive heart disease with heart failure: Secondary | ICD-10-CM | POA: Diagnosis not present

## 2019-06-19 DIAGNOSIS — I110 Hypertensive heart disease with heart failure: Secondary | ICD-10-CM | POA: Diagnosis not present

## 2019-06-19 DIAGNOSIS — I441 Atrioventricular block, second degree: Secondary | ICD-10-CM | POA: Diagnosis not present

## 2019-06-19 DIAGNOSIS — R29898 Other symptoms and signs involving the musculoskeletal system: Secondary | ICD-10-CM | POA: Diagnosis not present

## 2019-06-19 DIAGNOSIS — I5033 Acute on chronic diastolic (congestive) heart failure: Secondary | ICD-10-CM | POA: Diagnosis not present

## 2019-06-19 DIAGNOSIS — I69398 Other sequelae of cerebral infarction: Secondary | ICD-10-CM | POA: Diagnosis not present

## 2019-06-19 DIAGNOSIS — I480 Paroxysmal atrial fibrillation: Secondary | ICD-10-CM | POA: Diagnosis not present

## 2019-06-20 DIAGNOSIS — R29898 Other symptoms and signs involving the musculoskeletal system: Secondary | ICD-10-CM | POA: Diagnosis not present

## 2019-06-20 DIAGNOSIS — I110 Hypertensive heart disease with heart failure: Secondary | ICD-10-CM | POA: Diagnosis not present

## 2019-06-20 DIAGNOSIS — I441 Atrioventricular block, second degree: Secondary | ICD-10-CM | POA: Diagnosis not present

## 2019-06-20 DIAGNOSIS — I480 Paroxysmal atrial fibrillation: Secondary | ICD-10-CM | POA: Diagnosis not present

## 2019-06-20 DIAGNOSIS — I69398 Other sequelae of cerebral infarction: Secondary | ICD-10-CM | POA: Diagnosis not present

## 2019-06-20 DIAGNOSIS — I5033 Acute on chronic diastolic (congestive) heart failure: Secondary | ICD-10-CM | POA: Diagnosis not present

## 2019-06-21 DIAGNOSIS — R29898 Other symptoms and signs involving the musculoskeletal system: Secondary | ICD-10-CM | POA: Diagnosis not present

## 2019-06-21 DIAGNOSIS — I480 Paroxysmal atrial fibrillation: Secondary | ICD-10-CM | POA: Diagnosis not present

## 2019-06-21 DIAGNOSIS — I69398 Other sequelae of cerebral infarction: Secondary | ICD-10-CM | POA: Diagnosis not present

## 2019-06-21 DIAGNOSIS — I110 Hypertensive heart disease with heart failure: Secondary | ICD-10-CM | POA: Diagnosis not present

## 2019-06-21 DIAGNOSIS — I5033 Acute on chronic diastolic (congestive) heart failure: Secondary | ICD-10-CM | POA: Diagnosis not present

## 2019-06-21 DIAGNOSIS — I441 Atrioventricular block, second degree: Secondary | ICD-10-CM | POA: Diagnosis not present

## 2019-06-24 DIAGNOSIS — I5033 Acute on chronic diastolic (congestive) heart failure: Secondary | ICD-10-CM | POA: Diagnosis not present

## 2019-06-24 DIAGNOSIS — I110 Hypertensive heart disease with heart failure: Secondary | ICD-10-CM | POA: Diagnosis not present

## 2019-06-24 DIAGNOSIS — R29898 Other symptoms and signs involving the musculoskeletal system: Secondary | ICD-10-CM | POA: Diagnosis not present

## 2019-06-24 DIAGNOSIS — I480 Paroxysmal atrial fibrillation: Secondary | ICD-10-CM | POA: Diagnosis not present

## 2019-06-24 DIAGNOSIS — I69398 Other sequelae of cerebral infarction: Secondary | ICD-10-CM | POA: Diagnosis not present

## 2019-06-24 DIAGNOSIS — I441 Atrioventricular block, second degree: Secondary | ICD-10-CM | POA: Diagnosis not present

## 2019-06-25 DIAGNOSIS — I480 Paroxysmal atrial fibrillation: Secondary | ICD-10-CM | POA: Diagnosis not present

## 2019-06-25 DIAGNOSIS — I110 Hypertensive heart disease with heart failure: Secondary | ICD-10-CM | POA: Diagnosis not present

## 2019-06-25 DIAGNOSIS — I69398 Other sequelae of cerebral infarction: Secondary | ICD-10-CM | POA: Diagnosis not present

## 2019-06-25 DIAGNOSIS — I5033 Acute on chronic diastolic (congestive) heart failure: Secondary | ICD-10-CM | POA: Diagnosis not present

## 2019-06-25 DIAGNOSIS — R29898 Other symptoms and signs involving the musculoskeletal system: Secondary | ICD-10-CM | POA: Diagnosis not present

## 2019-06-25 DIAGNOSIS — I441 Atrioventricular block, second degree: Secondary | ICD-10-CM | POA: Diagnosis not present

## 2019-06-26 DIAGNOSIS — R29898 Other symptoms and signs involving the musculoskeletal system: Secondary | ICD-10-CM | POA: Diagnosis not present

## 2019-06-26 DIAGNOSIS — I110 Hypertensive heart disease with heart failure: Secondary | ICD-10-CM | POA: Diagnosis not present

## 2019-06-26 DIAGNOSIS — I480 Paroxysmal atrial fibrillation: Secondary | ICD-10-CM | POA: Diagnosis not present

## 2019-06-26 DIAGNOSIS — I5033 Acute on chronic diastolic (congestive) heart failure: Secondary | ICD-10-CM | POA: Diagnosis not present

## 2019-06-26 DIAGNOSIS — I441 Atrioventricular block, second degree: Secondary | ICD-10-CM | POA: Diagnosis not present

## 2019-06-26 DIAGNOSIS — I69398 Other sequelae of cerebral infarction: Secondary | ICD-10-CM | POA: Diagnosis not present

## 2019-06-30 ENCOUNTER — Other Ambulatory Visit: Payer: Self-pay | Admitting: Cardiology

## 2019-07-01 DIAGNOSIS — I480 Paroxysmal atrial fibrillation: Secondary | ICD-10-CM | POA: Diagnosis not present

## 2019-07-01 DIAGNOSIS — I5033 Acute on chronic diastolic (congestive) heart failure: Secondary | ICD-10-CM | POA: Diagnosis not present

## 2019-07-01 DIAGNOSIS — I69398 Other sequelae of cerebral infarction: Secondary | ICD-10-CM | POA: Diagnosis not present

## 2019-07-01 DIAGNOSIS — I441 Atrioventricular block, second degree: Secondary | ICD-10-CM | POA: Diagnosis not present

## 2019-07-01 DIAGNOSIS — I110 Hypertensive heart disease with heart failure: Secondary | ICD-10-CM | POA: Diagnosis not present

## 2019-07-01 DIAGNOSIS — R29898 Other symptoms and signs involving the musculoskeletal system: Secondary | ICD-10-CM | POA: Diagnosis not present

## 2019-07-02 DIAGNOSIS — I110 Hypertensive heart disease with heart failure: Secondary | ICD-10-CM | POA: Diagnosis not present

## 2019-07-02 DIAGNOSIS — I69398 Other sequelae of cerebral infarction: Secondary | ICD-10-CM | POA: Diagnosis not present

## 2019-07-02 DIAGNOSIS — I5033 Acute on chronic diastolic (congestive) heart failure: Secondary | ICD-10-CM | POA: Diagnosis not present

## 2019-07-02 DIAGNOSIS — R29898 Other symptoms and signs involving the musculoskeletal system: Secondary | ICD-10-CM | POA: Diagnosis not present

## 2019-07-02 DIAGNOSIS — I480 Paroxysmal atrial fibrillation: Secondary | ICD-10-CM | POA: Diagnosis not present

## 2019-07-02 DIAGNOSIS — I441 Atrioventricular block, second degree: Secondary | ICD-10-CM | POA: Diagnosis not present

## 2019-07-03 DIAGNOSIS — I5033 Acute on chronic diastolic (congestive) heart failure: Secondary | ICD-10-CM | POA: Diagnosis not present

## 2019-07-03 DIAGNOSIS — I441 Atrioventricular block, second degree: Secondary | ICD-10-CM | POA: Diagnosis not present

## 2019-07-03 DIAGNOSIS — R29898 Other symptoms and signs involving the musculoskeletal system: Secondary | ICD-10-CM | POA: Diagnosis not present

## 2019-07-03 DIAGNOSIS — I480 Paroxysmal atrial fibrillation: Secondary | ICD-10-CM | POA: Diagnosis not present

## 2019-07-03 DIAGNOSIS — I110 Hypertensive heart disease with heart failure: Secondary | ICD-10-CM | POA: Diagnosis not present

## 2019-07-03 DIAGNOSIS — I69398 Other sequelae of cerebral infarction: Secondary | ICD-10-CM | POA: Diagnosis not present

## 2019-07-04 DIAGNOSIS — Z9181 History of falling: Secondary | ICD-10-CM | POA: Diagnosis not present

## 2019-07-04 DIAGNOSIS — Z86010 Personal history of colonic polyps: Secondary | ICD-10-CM | POA: Diagnosis not present

## 2019-07-04 DIAGNOSIS — M5030 Other cervical disc degeneration, unspecified cervical region: Secondary | ICD-10-CM | POA: Diagnosis not present

## 2019-07-04 DIAGNOSIS — Z8554 Personal history of malignant neoplasm of ureter: Secondary | ICD-10-CM | POA: Diagnosis not present

## 2019-07-04 DIAGNOSIS — D649 Anemia, unspecified: Secondary | ICD-10-CM | POA: Diagnosis not present

## 2019-07-04 DIAGNOSIS — I5033 Acute on chronic diastolic (congestive) heart failure: Secondary | ICD-10-CM | POA: Diagnosis not present

## 2019-07-04 DIAGNOSIS — F419 Anxiety disorder, unspecified: Secondary | ICD-10-CM | POA: Diagnosis not present

## 2019-07-04 DIAGNOSIS — Z7901 Long term (current) use of anticoagulants: Secondary | ICD-10-CM | POA: Diagnosis not present

## 2019-07-04 DIAGNOSIS — I350 Nonrheumatic aortic (valve) stenosis: Secondary | ICD-10-CM | POA: Diagnosis not present

## 2019-07-04 DIAGNOSIS — Z8551 Personal history of malignant neoplasm of bladder: Secondary | ICD-10-CM | POA: Diagnosis not present

## 2019-07-04 DIAGNOSIS — F329 Major depressive disorder, single episode, unspecified: Secondary | ICD-10-CM | POA: Diagnosis not present

## 2019-07-04 DIAGNOSIS — I441 Atrioventricular block, second degree: Secondary | ICD-10-CM | POA: Diagnosis not present

## 2019-07-04 DIAGNOSIS — M8580 Other specified disorders of bone density and structure, unspecified site: Secondary | ICD-10-CM | POA: Diagnosis not present

## 2019-07-04 DIAGNOSIS — R29898 Other symptoms and signs involving the musculoskeletal system: Secondary | ICD-10-CM | POA: Diagnosis not present

## 2019-07-04 DIAGNOSIS — I69398 Other sequelae of cerebral infarction: Secondary | ICD-10-CM | POA: Diagnosis not present

## 2019-07-04 DIAGNOSIS — E039 Hypothyroidism, unspecified: Secondary | ICD-10-CM | POA: Diagnosis not present

## 2019-07-04 DIAGNOSIS — G8929 Other chronic pain: Secondary | ICD-10-CM | POA: Diagnosis not present

## 2019-07-04 DIAGNOSIS — I480 Paroxysmal atrial fibrillation: Secondary | ICD-10-CM | POA: Diagnosis not present

## 2019-07-04 DIAGNOSIS — I110 Hypertensive heart disease with heart failure: Secondary | ICD-10-CM | POA: Diagnosis not present

## 2019-07-04 DIAGNOSIS — Z87891 Personal history of nicotine dependence: Secondary | ICD-10-CM | POA: Diagnosis not present

## 2019-07-04 DIAGNOSIS — Z9981 Dependence on supplemental oxygen: Secondary | ICD-10-CM | POA: Diagnosis not present

## 2019-07-08 DIAGNOSIS — I480 Paroxysmal atrial fibrillation: Secondary | ICD-10-CM | POA: Diagnosis not present

## 2019-07-08 DIAGNOSIS — I110 Hypertensive heart disease with heart failure: Secondary | ICD-10-CM | POA: Diagnosis not present

## 2019-07-08 DIAGNOSIS — I69398 Other sequelae of cerebral infarction: Secondary | ICD-10-CM | POA: Diagnosis not present

## 2019-07-08 DIAGNOSIS — I5033 Acute on chronic diastolic (congestive) heart failure: Secondary | ICD-10-CM | POA: Diagnosis not present

## 2019-07-08 DIAGNOSIS — I441 Atrioventricular block, second degree: Secondary | ICD-10-CM | POA: Diagnosis not present

## 2019-07-08 DIAGNOSIS — R29898 Other symptoms and signs involving the musculoskeletal system: Secondary | ICD-10-CM | POA: Diagnosis not present

## 2019-07-09 DIAGNOSIS — I480 Paroxysmal atrial fibrillation: Secondary | ICD-10-CM | POA: Diagnosis not present

## 2019-07-09 DIAGNOSIS — I441 Atrioventricular block, second degree: Secondary | ICD-10-CM | POA: Diagnosis not present

## 2019-07-09 DIAGNOSIS — R29898 Other symptoms and signs involving the musculoskeletal system: Secondary | ICD-10-CM | POA: Diagnosis not present

## 2019-07-09 DIAGNOSIS — I69398 Other sequelae of cerebral infarction: Secondary | ICD-10-CM | POA: Diagnosis not present

## 2019-07-09 DIAGNOSIS — I5033 Acute on chronic diastolic (congestive) heart failure: Secondary | ICD-10-CM | POA: Diagnosis not present

## 2019-07-09 DIAGNOSIS — I110 Hypertensive heart disease with heart failure: Secondary | ICD-10-CM | POA: Diagnosis not present

## 2019-07-10 ENCOUNTER — Ambulatory Visit: Payer: Medicare Other | Admitting: Podiatry

## 2019-07-10 DIAGNOSIS — I69398 Other sequelae of cerebral infarction: Secondary | ICD-10-CM | POA: Diagnosis not present

## 2019-07-10 DIAGNOSIS — I5033 Acute on chronic diastolic (congestive) heart failure: Secondary | ICD-10-CM | POA: Diagnosis not present

## 2019-07-10 DIAGNOSIS — I110 Hypertensive heart disease with heart failure: Secondary | ICD-10-CM | POA: Diagnosis not present

## 2019-07-10 DIAGNOSIS — R29898 Other symptoms and signs involving the musculoskeletal system: Secondary | ICD-10-CM | POA: Diagnosis not present

## 2019-07-10 DIAGNOSIS — I441 Atrioventricular block, second degree: Secondary | ICD-10-CM | POA: Diagnosis not present

## 2019-07-10 DIAGNOSIS — I480 Paroxysmal atrial fibrillation: Secondary | ICD-10-CM | POA: Diagnosis not present

## 2019-07-15 DIAGNOSIS — I441 Atrioventricular block, second degree: Secondary | ICD-10-CM | POA: Diagnosis not present

## 2019-07-15 DIAGNOSIS — R29898 Other symptoms and signs involving the musculoskeletal system: Secondary | ICD-10-CM | POA: Diagnosis not present

## 2019-07-15 DIAGNOSIS — I69398 Other sequelae of cerebral infarction: Secondary | ICD-10-CM | POA: Diagnosis not present

## 2019-07-15 DIAGNOSIS — I480 Paroxysmal atrial fibrillation: Secondary | ICD-10-CM | POA: Diagnosis not present

## 2019-07-15 DIAGNOSIS — I5033 Acute on chronic diastolic (congestive) heart failure: Secondary | ICD-10-CM | POA: Diagnosis not present

## 2019-07-15 DIAGNOSIS — I110 Hypertensive heart disease with heart failure: Secondary | ICD-10-CM | POA: Diagnosis not present

## 2019-07-17 DIAGNOSIS — I110 Hypertensive heart disease with heart failure: Secondary | ICD-10-CM | POA: Diagnosis not present

## 2019-07-17 DIAGNOSIS — I69398 Other sequelae of cerebral infarction: Secondary | ICD-10-CM | POA: Diagnosis not present

## 2019-07-17 DIAGNOSIS — I5033 Acute on chronic diastolic (congestive) heart failure: Secondary | ICD-10-CM | POA: Diagnosis not present

## 2019-07-17 DIAGNOSIS — I441 Atrioventricular block, second degree: Secondary | ICD-10-CM | POA: Diagnosis not present

## 2019-07-17 DIAGNOSIS — I480 Paroxysmal atrial fibrillation: Secondary | ICD-10-CM | POA: Diagnosis not present

## 2019-07-17 DIAGNOSIS — R29898 Other symptoms and signs involving the musculoskeletal system: Secondary | ICD-10-CM | POA: Diagnosis not present

## 2019-07-22 ENCOUNTER — Telehealth: Payer: Self-pay | Admitting: Cardiology

## 2019-07-22 DIAGNOSIS — I480 Paroxysmal atrial fibrillation: Secondary | ICD-10-CM | POA: Diagnosis not present

## 2019-07-22 DIAGNOSIS — I441 Atrioventricular block, second degree: Secondary | ICD-10-CM | POA: Diagnosis not present

## 2019-07-22 DIAGNOSIS — I110 Hypertensive heart disease with heart failure: Secondary | ICD-10-CM | POA: Diagnosis not present

## 2019-07-22 DIAGNOSIS — R29898 Other symptoms and signs involving the musculoskeletal system: Secondary | ICD-10-CM | POA: Diagnosis not present

## 2019-07-22 DIAGNOSIS — I5033 Acute on chronic diastolic (congestive) heart failure: Secondary | ICD-10-CM | POA: Diagnosis not present

## 2019-07-22 DIAGNOSIS — I69398 Other sequelae of cerebral infarction: Secondary | ICD-10-CM | POA: Diagnosis not present

## 2019-07-22 NOTE — Telephone Encounter (Signed)
Daughter of patient called. The daughter keeps getting calls from [facility], who bought out [employer]. Apparently the two companies have different standards for continuing home oxygen orders. The daughter was under the impression that [facility] got everything they needed already. The daughter is unsure as to why [employer] keeps calling her.   Please confirm with [place] orders so that her mom can continue receiving oxygen at home.

## 2019-07-22 NOTE — Telephone Encounter (Signed)
Spoke with pt daughter, aware the oxygen does not come from [place]. It was started by the hospitalist while she was in the hospital. The daughter will call the medical doctor.

## 2019-07-22 NOTE — Telephone Encounter (Signed)
Any idea on this? For [employer] for her oxygen?

## 2019-07-23 ENCOUNTER — Telehealth: Payer: Self-pay

## 2019-07-23 NOTE — Telephone Encounter (Signed)

## 2019-07-24 DIAGNOSIS — I5033 Acute on chronic diastolic (congestive) heart failure: Secondary | ICD-10-CM | POA: Diagnosis not present

## 2019-07-24 DIAGNOSIS — R29898 Other symptoms and signs involving the musculoskeletal system: Secondary | ICD-10-CM | POA: Diagnosis not present

## 2019-07-24 DIAGNOSIS — I110 Hypertensive heart disease with heart failure: Secondary | ICD-10-CM | POA: Diagnosis not present

## 2019-07-24 DIAGNOSIS — I441 Atrioventricular block, second degree: Secondary | ICD-10-CM | POA: Diagnosis not present

## 2019-07-24 DIAGNOSIS — I69398 Other sequelae of cerebral infarction: Secondary | ICD-10-CM | POA: Diagnosis not present

## 2019-07-24 DIAGNOSIS — I480 Paroxysmal atrial fibrillation: Secondary | ICD-10-CM | POA: Diagnosis not present

## 2019-07-25 DIAGNOSIS — R350 Frequency of micturition: Secondary | ICD-10-CM | POA: Diagnosis not present

## 2019-07-26 DIAGNOSIS — I5033 Acute on chronic diastolic (congestive) heart failure: Secondary | ICD-10-CM | POA: Diagnosis not present

## 2019-07-26 DIAGNOSIS — I5030 Unspecified diastolic (congestive) heart failure: Secondary | ICD-10-CM | POA: Diagnosis not present

## 2019-07-26 DIAGNOSIS — I639 Cerebral infarction, unspecified: Secondary | ICD-10-CM | POA: Diagnosis not present

## 2019-07-26 DIAGNOSIS — I69398 Other sequelae of cerebral infarction: Secondary | ICD-10-CM | POA: Diagnosis not present

## 2019-07-26 DIAGNOSIS — I110 Hypertensive heart disease with heart failure: Secondary | ICD-10-CM | POA: Diagnosis not present

## 2019-07-26 DIAGNOSIS — J9691 Respiratory failure, unspecified with hypoxia: Secondary | ICD-10-CM | POA: Diagnosis not present

## 2019-07-26 DIAGNOSIS — R29898 Other symptoms and signs involving the musculoskeletal system: Secondary | ICD-10-CM | POA: Diagnosis not present

## 2019-07-26 DIAGNOSIS — I4891 Unspecified atrial fibrillation: Secondary | ICD-10-CM | POA: Diagnosis not present

## 2019-07-26 DIAGNOSIS — E039 Hypothyroidism, unspecified: Secondary | ICD-10-CM | POA: Diagnosis not present

## 2019-07-26 DIAGNOSIS — D6869 Other thrombophilia: Secondary | ICD-10-CM | POA: Diagnosis not present

## 2019-07-26 DIAGNOSIS — I480 Paroxysmal atrial fibrillation: Secondary | ICD-10-CM | POA: Diagnosis not present

## 2019-07-26 DIAGNOSIS — F322 Major depressive disorder, single episode, severe without psychotic features: Secondary | ICD-10-CM | POA: Diagnosis not present

## 2019-07-26 DIAGNOSIS — I441 Atrioventricular block, second degree: Secondary | ICD-10-CM | POA: Diagnosis not present

## 2019-07-26 DIAGNOSIS — Z7189 Other specified counseling: Secondary | ICD-10-CM | POA: Diagnosis not present

## 2019-07-29 DIAGNOSIS — I69398 Other sequelae of cerebral infarction: Secondary | ICD-10-CM | POA: Diagnosis not present

## 2019-07-29 DIAGNOSIS — I110 Hypertensive heart disease with heart failure: Secondary | ICD-10-CM | POA: Diagnosis not present

## 2019-07-29 DIAGNOSIS — I441 Atrioventricular block, second degree: Secondary | ICD-10-CM | POA: Diagnosis not present

## 2019-07-29 DIAGNOSIS — R29898 Other symptoms and signs involving the musculoskeletal system: Secondary | ICD-10-CM | POA: Diagnosis not present

## 2019-07-29 DIAGNOSIS — I480 Paroxysmal atrial fibrillation: Secondary | ICD-10-CM | POA: Diagnosis not present

## 2019-07-29 DIAGNOSIS — I5033 Acute on chronic diastolic (congestive) heart failure: Secondary | ICD-10-CM | POA: Diagnosis not present

## 2019-07-30 ENCOUNTER — Encounter: Payer: Self-pay | Admitting: Cardiology

## 2019-07-30 ENCOUNTER — Telehealth (INDEPENDENT_AMBULATORY_CARE_PROVIDER_SITE_OTHER): Payer: Medicare Other | Admitting: Cardiology

## 2019-07-30 ENCOUNTER — Telehealth: Payer: Self-pay

## 2019-07-30 VITALS — BP 177/88 | HR 64 | Temp 98.7°F | Ht 64.0 in | Wt 167.0 lb

## 2019-07-30 DIAGNOSIS — Z8673 Personal history of transient ischemic attack (TIA), and cerebral infarction without residual deficits: Secondary | ICD-10-CM

## 2019-07-30 DIAGNOSIS — I480 Paroxysmal atrial fibrillation: Secondary | ICD-10-CM | POA: Diagnosis not present

## 2019-07-30 DIAGNOSIS — I69398 Other sequelae of cerebral infarction: Secondary | ICD-10-CM | POA: Diagnosis not present

## 2019-07-30 DIAGNOSIS — R001 Bradycardia, unspecified: Secondary | ICD-10-CM

## 2019-07-30 DIAGNOSIS — I10 Essential (primary) hypertension: Secondary | ICD-10-CM | POA: Diagnosis not present

## 2019-07-30 DIAGNOSIS — I110 Hypertensive heart disease with heart failure: Secondary | ICD-10-CM | POA: Diagnosis not present

## 2019-07-30 DIAGNOSIS — R29898 Other symptoms and signs involving the musculoskeletal system: Secondary | ICD-10-CM | POA: Diagnosis not present

## 2019-07-30 DIAGNOSIS — I441 Atrioventricular block, second degree: Secondary | ICD-10-CM | POA: Diagnosis not present

## 2019-07-30 DIAGNOSIS — Z7901 Long term (current) use of anticoagulants: Secondary | ICD-10-CM

## 2019-07-30 DIAGNOSIS — I5033 Acute on chronic diastolic (congestive) heart failure: Secondary | ICD-10-CM | POA: Diagnosis not present

## 2019-07-30 NOTE — Patient Instructions (Signed)
Medication Instructions:  Your physician recommends that you continue on your current medications as directed. Please refer to the Current Medication list given to you today. *If you need a refill on your cardiac medications before your next appointment, please call your pharmacy*  Lab Work: None  If you have labs (blood work) drawn today and your tests are completely normal, you will receive your results only by: Marland Kitchen MyChart Message (if you have MyChart) OR . A paper copy in the mail If you have any lab test that is abnormal or we need to change your treatment, we will call you to review the results.  Testing/Procedures: None   Follow-Up: At [facility], you and your health needs are our priority.  As part of our continuing mission to provide you with exceptional heart care, we have created designated Provider Care Teams.  These Care Teams include your primary Cardiologist (physician) and Advanced Practice Providers (APPs -  Physician Assistants and Nurse Practitioners) who all work together to provide you with the care you need, when you need it.  Your next appointment:   6 month(s)  The format for your next appointment:   In Person  Provider:   Kirk Ruths, MD  Other Instructions

## 2019-07-30 NOTE — Telephone Encounter (Signed)
Contacted patient to discuss AVS Instructions. Gave patient Luke's recommendations from today's virtual office visit. Informed patient that someone from the scheduling dept will be in contact with them to schedule their follow up appt. Patient voiced understanding and AVS mailed.    

## 2019-07-30 NOTE — Progress Notes (Signed)
Virtual Visit via Video Note   This visit type was conducted due to national recommendations for restrictions regarding the COVID-19 Pandemic (e.g. social distancing) in an effort to limit this patient's exposure and mitigate transmission in our community.  Due to her co-morbid illnesses, this patient is at least at moderate risk for complications without adequate follow up.  This format is felt to be most appropriate for this patient at this time.  All issues noted in this document were discussed and addressed.  A limited physical exam was performed with this format.  Please refer to the patient's chart for her consent to telehealth for [facility].   Date:  07/30/2019   ID:  Rebecca Norman, DOB 1925/02/08, MRN [id number]  Patient Location: Home Provider Location: Home  PCP:  [name], MD  Cardiologist:  Kirk Ruths, MD  Electrophysiologist:  None   Evaluation Performed:  Follow-Up Visit  Chief Complaint:  none  History of Present Illness:    Rebecca Norman is a pleasant 85 y.o. female with a hx of PAF, previously maintained on amiodarone.  She was admitted 05/08/2019 with generalized weakness and a history of falls.  On admission her heart rate was thought to be in the 40s, on review it was felt that her heart rhythm was normal sinus rhythm in the 50s with PACs and blocked PACs.  It was not felt that that was the cause of her weakness and  It was recommended a neurology consult be obtained.  An MRI was done which did show acute infarcts in the right pons, right cerebellum, right cortex and left putamen.  There were scattered areas of hemorrhage which could be subacute versus chronic.  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 The patient was monitored on telemetry and continued to have bradycardia.  It was ultimately decided to stop the patient's amiodarone.  She was discharged to [facility].  I saw her in the office 05/31/2019. She was contacted today via  video. Her daughter Rebecca Norman was present, her daughter is a former Therapist, sports. The patient has since returned to her home.  She has help in the mornings and her son lives close by. She is generally weak, on home O2.  She had one fall since we saw her last- no serious injury.  She denies any tachycardia.  Her HR is usually in the 55-60 range per her daughter.  Her B/P runs [id number] systolic.   The patient does not have symptoms concerning for COVID-19 infection (fever, chills, cough, or new shortness of breath).    Past Medical History:  Diagnosis Date  . DDD (degenerative disc disease), cervical    severe/  auto fusion c2-c5  . Diverticulosis   . Hemorrhoids   . History of adenomatous polyp of colon    1995  adenomatous polyp/  1997 & 2003  hyperplastic polyp's  . History of bladder cancer    s/p  TURBT's, urologist-  Dr [name]  . History of cancer of ureter   . Hypertension   . Hypothyroid   . Nocturia   . Osteoarthritis   . Osteopenia   . Symptomatic bradycardia 05/06/2019   Past Surgical History:  Procedure Laterality Date  . BACK SURGERY    . CARDIOVERSION N/A 08/09/2017   Procedure: CARDIOVERSION;  Surgeon: Larey Dresser, MD;  Location: [facility] ENDOSCOPY;  Service: Cardiovascular;  Laterality: N/A;  . CATARACT EXTRACTION W/ INTRAOCULAR LENS  IMPLANT, BILATERAL Bilateral   . CYSTOSCOPY W/ RETROGRADES  10/03/2011   Procedure: CYSTOSCOPY WITH RETROGRADE PYELOGRAM;  Surgeon: Molli Hazard, MD;  Location: [facility];  Service: Urology;;  . Consuela Mimes W/ RETROGRADES Bilateral 06/17/2016   Procedure: CYSTOSCOPY WITH RETROGRADE PYELOGRAM;  Surgeon: Alexis Frock, MD;  Location: [facility];  Service: Urology;  Laterality: Bilateral;  . CYSTOSCOPY WITH BIOPSY N/A 06/17/2016   Procedure: CYSTOSCOPY WITH BIOPSY AND FULGERATION;  Surgeon: Alexis Frock, MD;  Location: [facility];  Service: Urology;  Laterality: N/A;  . LAMINECTOMY  2009   L3 - 4  .  ORIF ANKLE FRACTURE Left 08/28/2017   Procedure: OPEN REDUCTION INTERNAL FIXATION (ORIF) ANKLE FRACTURE;  Surgeon: Shona Needles, MD;  Location: [facility];  Service: Orthopedics;  Laterality: Left;  . TRANSURETHRAL RESECTION OF BLADDER  x3 --  2009; 2010; 06-29-2010  . TRANSURETHRAL RESECTION OF BLADDER TUMOR  10/03/2011   Procedure: TRANSURETHRAL RESECTION OF BLADDER TUMOR (TURBT);  Surgeon: Molli Hazard, MD;  Location: [facility];  Service: Urology;  Laterality: N/A;     Current Meds  Medication Sig  . acetaminophen (TYLENOL) 500 MG tablet Take 500 mg by mouth every 6 (six) hours as needed for headache (pain).   . ciprofloxacin (CIPRO) 250 MG tablet Take 1 tablet by mouth every 12 (twelve) hours.  Marland Kitchen ELIQUIS 5 MG TABS tablet TAKE 1 TABLET TWICE A DAY  . furosemide (LASIX) 20 MG tablet Take 1 tablet (20 mg total) by mouth daily.  Marland Kitchen levothyroxine (SYNTHROID, LEVOTHROID) 50 MCG tablet Take 50-75 mcg by mouth See admin instructions. Take every other alternating between 29mcg and 5mcg.  Marland Kitchen oxybutynin (DITROPAN) 5 MG tablet Take 5 mg by mouth at bedtime as needed.  . OXYGEN Inhale 4.5 L into the lungs continuous.   . sertraline (ZOLOFT) 50 MG tablet Take 50 mg by mouth at bedtime.      Allergies:   Flagyl [metronidazole] and Lisinopril   Social History   Tobacco Use  . Smoking status: Former Smoker    Years: 10.00    Types: Cigarettes    Quit date: 09/25/2005    Years since quitting: 13.8  . Smokeless tobacco: Never Used  Substance Use Topics  . Alcohol use: Yes  . Drug use: No     Family Hx: The patient's family history includes Breast cancer in her sister; Hernia (age of onset: 34) in her sister; Stomach cancer in her sister. There is no history of Colon cancer.  ROS:   Please see the history of present illness.    All other systems reviewed and are negative.   Prior CV studies:   The following studies were reviewed today: Echo 05/07/2019  Labs/Other  Tests and Data Reviewed:    EKG:  No ECG reviewed.  Recent Labs: 04/29/2019: TSH 4.530 05/06/2019: B Natriuretic Peptide 1,032.4; Magnesium 1.9 05/07/2019: ALT 10 05/10/2019: BUN 24; Creatinine, Ser 0.93; Potassium 4.8; Sodium 135 05/11/2019: Hemoglobin 10.8; Platelets 266   Recent Lipid Panel Lab Results  Component Value Date/Time   CHOL 127 05/09/2019 03:52 AM   TRIG 110 05/09/2019 03:52 AM   HDL 40 (L) 05/09/2019 03:52 AM   CHOLHDL 3.2 05/09/2019 03:52 AM   LDLCALC 65 05/09/2019 03:52 AM    Wt Readings from Last 3 Encounters:  07/30/19 167 lb (75.8 kg)  05/31/19 166 lb (75.3 kg)  05/11/19 164 lb 12.8 oz (74.8 kg)     Objective:    Vital Signs:  BP (!) 177/88   Pulse  64   Temp 98.7 F (37.1 C) (Oral)   Ht 5\' 4"  (1.626 m)   Wt 167 lb (75.8 kg)   SpO2 99%   BMI 28.67 kg/m    VITAL SIGNS:  reviewed  ASSESSMENT & PLAN:    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  Amiodarone was discontinued. HR [id number]  Chronic diastolic CHF (congestive heart failure) (HCC) Stable-she is only taking Lasix 20 mg daily- BMP drawn by [facility]    Paroxysmal atrial fibrillation (HCC) Previously on Amiodarone-holding NSR so far  Current use of long term anticoagulation CHADS VASC=5- on Eliquis 5 mg BID based on her SCr and weight  COVID-19 Education: The signs and symptoms of COVID-19 were discussed with the patient and how to seek care for testing (follow up with PCP or arrange E-visit).  The importance of social distancing was discussed today.  Time:   Today, I have spent 20 minutes with the patient with telehealth technology discussing the above problems.     Medication Adjustments/Labs and Tests Ordered: Current medicines are reviewed at length with the patient today.  Concerns regarding medicines are outlined above.    Tests Ordered: No orders of the defined types were placed in this encounter.   Medication Changes: No orders of the defined types were placed in this encounter.   Follow Up:  In Person Dr [name] in May  Signed, Aarsh Fristoe, Hershal Coria  07/30/2019 11:24 AM    [place]

## 2019-07-31 ENCOUNTER — Ambulatory Visit: Payer: Medicare Other | Admitting: Cardiology

## 2019-08-05 ENCOUNTER — Other Ambulatory Visit: Payer: Self-pay

## 2019-08-05 MED ORDER — FUROSEMIDE 20 MG PO TABS: 20.0000 mg | ORAL_TABLET | Freq: Every day | ORAL | 3 refills | Status: DC

## 2019-08-06 DIAGNOSIS — I110 Hypertensive heart disease with heart failure: Secondary | ICD-10-CM | POA: Diagnosis not present

## 2019-08-06 DIAGNOSIS — I69398 Other sequelae of cerebral infarction: Secondary | ICD-10-CM | POA: Diagnosis not present

## 2019-08-06 DIAGNOSIS — R29898 Other symptoms and signs involving the musculoskeletal system: Secondary | ICD-10-CM | POA: Diagnosis not present

## 2019-08-06 DIAGNOSIS — Z87891 Personal history of nicotine dependence: Secondary | ICD-10-CM | POA: Diagnosis not present

## 2019-08-06 DIAGNOSIS — E039 Hypothyroidism, unspecified: Secondary | ICD-10-CM | POA: Diagnosis not present

## 2019-08-06 DIAGNOSIS — G8929 Other chronic pain: Secondary | ICD-10-CM | POA: Diagnosis not present

## 2019-08-06 DIAGNOSIS — N390 Urinary tract infection, site not specified: Secondary | ICD-10-CM | POA: Diagnosis not present

## 2019-08-06 DIAGNOSIS — Z7901 Long term (current) use of anticoagulants: Secondary | ICD-10-CM | POA: Diagnosis not present

## 2019-08-06 DIAGNOSIS — H9193 Unspecified hearing loss, bilateral: Secondary | ICD-10-CM | POA: Diagnosis not present

## 2019-08-06 DIAGNOSIS — M8580 Other specified disorders of bone density and structure, unspecified site: Secondary | ICD-10-CM | POA: Diagnosis not present

## 2019-08-06 DIAGNOSIS — I480 Paroxysmal atrial fibrillation: Secondary | ICD-10-CM | POA: Diagnosis not present

## 2019-08-06 DIAGNOSIS — Z9181 History of falling: Secondary | ICD-10-CM | POA: Diagnosis not present

## 2019-08-06 DIAGNOSIS — I5033 Acute on chronic diastolic (congestive) heart failure: Secondary | ICD-10-CM | POA: Diagnosis not present

## 2019-08-06 DIAGNOSIS — Z86010 Personal history of colonic polyps: Secondary | ICD-10-CM | POA: Diagnosis not present

## 2019-08-06 DIAGNOSIS — M5030 Other cervical disc degeneration, unspecified cervical region: Secondary | ICD-10-CM | POA: Diagnosis not present

## 2019-08-06 DIAGNOSIS — M1991 Primary osteoarthritis, unspecified site: Secondary | ICD-10-CM | POA: Diagnosis not present

## 2019-08-06 DIAGNOSIS — Z9981 Dependence on supplemental oxygen: Secondary | ICD-10-CM | POA: Diagnosis not present

## 2019-08-06 DIAGNOSIS — Z8551 Personal history of malignant neoplasm of bladder: Secondary | ICD-10-CM | POA: Diagnosis not present

## 2019-08-06 DIAGNOSIS — I441 Atrioventricular block, second degree: Secondary | ICD-10-CM | POA: Diagnosis not present

## 2019-08-06 DIAGNOSIS — I5022 Chronic systolic (congestive) heart failure: Secondary | ICD-10-CM | POA: Diagnosis not present

## 2019-08-06 DIAGNOSIS — F329 Major depressive disorder, single episode, unspecified: Secondary | ICD-10-CM | POA: Diagnosis not present

## 2019-08-06 DIAGNOSIS — J9611 Chronic respiratory failure with hypoxia: Secondary | ICD-10-CM | POA: Diagnosis not present

## 2019-08-06 DIAGNOSIS — D649 Anemia, unspecified: Secondary | ICD-10-CM | POA: Diagnosis not present

## 2019-08-06 DIAGNOSIS — R32 Unspecified urinary incontinence: Secondary | ICD-10-CM | POA: Diagnosis not present

## 2019-08-07 ENCOUNTER — Encounter: Payer: Self-pay | Admitting: Podiatry

## 2019-08-07 ENCOUNTER — Ambulatory Visit (INDEPENDENT_AMBULATORY_CARE_PROVIDER_SITE_OTHER): Payer: Medicare Other | Admitting: Podiatry

## 2019-08-07 ENCOUNTER — Other Ambulatory Visit: Payer: Self-pay

## 2019-08-07 DIAGNOSIS — D689 Coagulation defect, unspecified: Secondary | ICD-10-CM

## 2019-08-07 DIAGNOSIS — B351 Tinea unguium: Secondary | ICD-10-CM

## 2019-08-07 DIAGNOSIS — M79674 Pain in right toe(s): Secondary | ICD-10-CM | POA: Diagnosis not present

## 2019-08-07 DIAGNOSIS — M79675 Pain in left toe(s): Secondary | ICD-10-CM

## 2019-08-07 MED ORDER — FUROSEMIDE 20 MG PO TABS: 20.0000 mg | ORAL_TABLET | Freq: Every day | ORAL | 3 refills | Status: DC

## 2019-08-07 NOTE — Progress Notes (Signed)
Complaint:  Visit Type: Patient returns to my office for continued preventative foot care services. Complaint: Patient states" my nails have grown long and thick and become painful to walk and wear shoes".  Patient presents in a wheelchair with her daughter. . The patient presents for preventative foot care services. No changes to ROS.    Podiatric Exam: Vascular: dorsalis pedis are palpable bilateral.  Posterior tibial pulses are non palpable. Capillary return is immediate. Temperature gradient is WNL. Skin turgor WNL  Sensorium: Normal Semmes Weinstein monofilament test. Normal tactile sensation bilaterally. Nail Exam: Pt has thick disfigured discolored nails with subungual debris noted bilateral entire nail hallux through fifth toenails Ulcer Exam: There is no evidence of ulcer or pre-ulcerative changes or infection. Orthopedic Exam: Muscle tone and strength are WNL. No limitations in general ROM. No crepitus or effusions noted. Foot type and digits show no abnormalities  HAV  B/L.  Overlapping second digitB/L. Skin: No Porokeratosis. No infection or ulcers  Diagnosis:  Onychomycosis, , Pain in right toe, pain in left toes  Treatment & Plan Procedures and Treatment: Consent by patient was obtained for treatment procedures.   Debridement of mycotic and hypertrophic toenails, 1 through 5 bilateral and clearing of subungual debris. No ulceration, no infection noted.  Return Visit-Office Procedure: Patient instructed to return to the office for a follow up visit 3 months for continued evaluation and treatment.    Gardiner Barefoot DPM

## 2019-08-08 ENCOUNTER — Other Ambulatory Visit: Payer: Self-pay

## 2019-08-08 NOTE — Telephone Encounter (Signed)
REFILL 

## 2019-08-09 ENCOUNTER — Other Ambulatory Visit: Payer: Self-pay | Admitting: Cardiology

## 2019-08-09 DIAGNOSIS — M5030 Other cervical disc degeneration, unspecified cervical region: Secondary | ICD-10-CM | POA: Diagnosis not present

## 2019-08-09 DIAGNOSIS — I69398 Other sequelae of cerebral infarction: Secondary | ICD-10-CM | POA: Diagnosis not present

## 2019-08-09 DIAGNOSIS — I5033 Acute on chronic diastolic (congestive) heart failure: Secondary | ICD-10-CM | POA: Diagnosis not present

## 2019-08-09 DIAGNOSIS — I110 Hypertensive heart disease with heart failure: Secondary | ICD-10-CM | POA: Diagnosis not present

## 2019-08-09 DIAGNOSIS — I480 Paroxysmal atrial fibrillation: Secondary | ICD-10-CM | POA: Diagnosis not present

## 2019-08-09 DIAGNOSIS — R29898 Other symptoms and signs involving the musculoskeletal system: Secondary | ICD-10-CM | POA: Diagnosis not present

## 2019-08-09 MED ORDER — FUROSEMIDE 20 MG PO TABS: 20.0000 mg | ORAL_TABLET | Freq: Every day | ORAL | 3 refills | Status: AC

## 2019-08-12 DIAGNOSIS — M5030 Other cervical disc degeneration, unspecified cervical region: Secondary | ICD-10-CM | POA: Diagnosis not present

## 2019-08-12 DIAGNOSIS — I480 Paroxysmal atrial fibrillation: Secondary | ICD-10-CM | POA: Diagnosis not present

## 2019-08-12 DIAGNOSIS — R29898 Other symptoms and signs involving the musculoskeletal system: Secondary | ICD-10-CM | POA: Diagnosis not present

## 2019-08-12 DIAGNOSIS — I69398 Other sequelae of cerebral infarction: Secondary | ICD-10-CM | POA: Diagnosis not present

## 2019-08-12 DIAGNOSIS — I5033 Acute on chronic diastolic (congestive) heart failure: Secondary | ICD-10-CM | POA: Diagnosis not present

## 2019-08-12 DIAGNOSIS — I110 Hypertensive heart disease with heart failure: Secondary | ICD-10-CM | POA: Diagnosis not present

## 2019-08-13 DIAGNOSIS — I5033 Acute on chronic diastolic (congestive) heart failure: Secondary | ICD-10-CM | POA: Diagnosis not present

## 2019-08-13 DIAGNOSIS — M5030 Other cervical disc degeneration, unspecified cervical region: Secondary | ICD-10-CM | POA: Diagnosis not present

## 2019-08-13 DIAGNOSIS — I69398 Other sequelae of cerebral infarction: Secondary | ICD-10-CM | POA: Diagnosis not present

## 2019-08-13 DIAGNOSIS — I110 Hypertensive heart disease with heart failure: Secondary | ICD-10-CM | POA: Diagnosis not present

## 2019-08-13 DIAGNOSIS — R29898 Other symptoms and signs involving the musculoskeletal system: Secondary | ICD-10-CM | POA: Diagnosis not present

## 2019-08-13 DIAGNOSIS — I480 Paroxysmal atrial fibrillation: Secondary | ICD-10-CM | POA: Diagnosis not present

## 2019-08-14 DIAGNOSIS — R29898 Other symptoms and signs involving the musculoskeletal system: Secondary | ICD-10-CM | POA: Diagnosis not present

## 2019-08-14 DIAGNOSIS — I69398 Other sequelae of cerebral infarction: Secondary | ICD-10-CM | POA: Diagnosis not present

## 2019-08-14 DIAGNOSIS — M5030 Other cervical disc degeneration, unspecified cervical region: Secondary | ICD-10-CM | POA: Diagnosis not present

## 2019-08-14 DIAGNOSIS — I480 Paroxysmal atrial fibrillation: Secondary | ICD-10-CM | POA: Diagnosis not present

## 2019-08-14 DIAGNOSIS — I5033 Acute on chronic diastolic (congestive) heart failure: Secondary | ICD-10-CM | POA: Diagnosis not present

## 2019-08-14 DIAGNOSIS — I110 Hypertensive heart disease with heart failure: Secondary | ICD-10-CM | POA: Diagnosis not present

## 2019-08-15 DIAGNOSIS — I110 Hypertensive heart disease with heart failure: Secondary | ICD-10-CM | POA: Diagnosis not present

## 2019-08-15 DIAGNOSIS — I5033 Acute on chronic diastolic (congestive) heart failure: Secondary | ICD-10-CM | POA: Diagnosis not present

## 2019-08-15 DIAGNOSIS — I480 Paroxysmal atrial fibrillation: Secondary | ICD-10-CM | POA: Diagnosis not present

## 2019-08-15 DIAGNOSIS — R29898 Other symptoms and signs involving the musculoskeletal system: Secondary | ICD-10-CM | POA: Diagnosis not present

## 2019-08-15 DIAGNOSIS — I69398 Other sequelae of cerebral infarction: Secondary | ICD-10-CM | POA: Diagnosis not present

## 2019-08-15 DIAGNOSIS — M5030 Other cervical disc degeneration, unspecified cervical region: Secondary | ICD-10-CM | POA: Diagnosis not present

## 2019-08-20 DIAGNOSIS — I110 Hypertensive heart disease with heart failure: Secondary | ICD-10-CM | POA: Diagnosis not present

## 2019-08-20 DIAGNOSIS — I69398 Other sequelae of cerebral infarction: Secondary | ICD-10-CM | POA: Diagnosis not present

## 2019-08-20 DIAGNOSIS — R29898 Other symptoms and signs involving the musculoskeletal system: Secondary | ICD-10-CM | POA: Diagnosis not present

## 2019-08-20 DIAGNOSIS — I5033 Acute on chronic diastolic (congestive) heart failure: Secondary | ICD-10-CM | POA: Diagnosis not present

## 2019-08-20 DIAGNOSIS — M5030 Other cervical disc degeneration, unspecified cervical region: Secondary | ICD-10-CM | POA: Diagnosis not present

## 2019-08-20 DIAGNOSIS — I480 Paroxysmal atrial fibrillation: Secondary | ICD-10-CM | POA: Diagnosis not present

## 2019-08-21 DIAGNOSIS — I5033 Acute on chronic diastolic (congestive) heart failure: Secondary | ICD-10-CM | POA: Diagnosis not present

## 2019-08-21 DIAGNOSIS — I110 Hypertensive heart disease with heart failure: Secondary | ICD-10-CM | POA: Diagnosis not present

## 2019-08-21 DIAGNOSIS — I480 Paroxysmal atrial fibrillation: Secondary | ICD-10-CM | POA: Diagnosis not present

## 2019-08-21 DIAGNOSIS — R29898 Other symptoms and signs involving the musculoskeletal system: Secondary | ICD-10-CM | POA: Diagnosis not present

## 2019-08-21 DIAGNOSIS — I69398 Other sequelae of cerebral infarction: Secondary | ICD-10-CM | POA: Diagnosis not present

## 2019-08-21 DIAGNOSIS — M5030 Other cervical disc degeneration, unspecified cervical region: Secondary | ICD-10-CM | POA: Diagnosis not present

## 2019-08-22 DIAGNOSIS — I69398 Other sequelae of cerebral infarction: Secondary | ICD-10-CM | POA: Diagnosis not present

## 2019-08-22 DIAGNOSIS — M5030 Other cervical disc degeneration, unspecified cervical region: Secondary | ICD-10-CM | POA: Diagnosis not present

## 2019-08-22 DIAGNOSIS — I110 Hypertensive heart disease with heart failure: Secondary | ICD-10-CM | POA: Diagnosis not present

## 2019-08-22 DIAGNOSIS — I480 Paroxysmal atrial fibrillation: Secondary | ICD-10-CM | POA: Diagnosis not present

## 2019-08-22 DIAGNOSIS — R29898 Other symptoms and signs involving the musculoskeletal system: Secondary | ICD-10-CM | POA: Diagnosis not present

## 2019-08-22 DIAGNOSIS — I5033 Acute on chronic diastolic (congestive) heart failure: Secondary | ICD-10-CM | POA: Diagnosis not present

## 2019-08-23 DIAGNOSIS — I110 Hypertensive heart disease with heart failure: Secondary | ICD-10-CM | POA: Diagnosis not present

## 2019-08-23 DIAGNOSIS — I69398 Other sequelae of cerebral infarction: Secondary | ICD-10-CM | POA: Diagnosis not present

## 2019-08-23 DIAGNOSIS — M5030 Other cervical disc degeneration, unspecified cervical region: Secondary | ICD-10-CM | POA: Diagnosis not present

## 2019-08-23 DIAGNOSIS — I480 Paroxysmal atrial fibrillation: Secondary | ICD-10-CM | POA: Diagnosis not present

## 2019-08-23 DIAGNOSIS — I5033 Acute on chronic diastolic (congestive) heart failure: Secondary | ICD-10-CM | POA: Diagnosis not present

## 2019-08-23 DIAGNOSIS — R29898 Other symptoms and signs involving the musculoskeletal system: Secondary | ICD-10-CM | POA: Diagnosis not present

## 2019-08-26 DIAGNOSIS — I5033 Acute on chronic diastolic (congestive) heart failure: Secondary | ICD-10-CM | POA: Diagnosis not present

## 2019-08-26 DIAGNOSIS — I480 Paroxysmal atrial fibrillation: Secondary | ICD-10-CM | POA: Diagnosis not present

## 2019-08-26 DIAGNOSIS — R29898 Other symptoms and signs involving the musculoskeletal system: Secondary | ICD-10-CM | POA: Diagnosis not present

## 2019-08-26 DIAGNOSIS — I110 Hypertensive heart disease with heart failure: Secondary | ICD-10-CM | POA: Diagnosis not present

## 2019-08-26 DIAGNOSIS — I69398 Other sequelae of cerebral infarction: Secondary | ICD-10-CM | POA: Diagnosis not present

## 2019-08-26 DIAGNOSIS — M5030 Other cervical disc degeneration, unspecified cervical region: Secondary | ICD-10-CM | POA: Diagnosis not present

## 2019-08-27 DIAGNOSIS — I5033 Acute on chronic diastolic (congestive) heart failure: Secondary | ICD-10-CM | POA: Diagnosis not present

## 2019-08-27 DIAGNOSIS — I480 Paroxysmal atrial fibrillation: Secondary | ICD-10-CM | POA: Diagnosis not present

## 2019-08-27 DIAGNOSIS — I110 Hypertensive heart disease with heart failure: Secondary | ICD-10-CM | POA: Diagnosis not present

## 2019-08-27 DIAGNOSIS — R29898 Other symptoms and signs involving the musculoskeletal system: Secondary | ICD-10-CM | POA: Diagnosis not present

## 2019-08-27 DIAGNOSIS — M5030 Other cervical disc degeneration, unspecified cervical region: Secondary | ICD-10-CM | POA: Diagnosis not present

## 2019-08-27 DIAGNOSIS — I69398 Other sequelae of cerebral infarction: Secondary | ICD-10-CM | POA: Diagnosis not present

## 2019-09-03 DIAGNOSIS — I480 Paroxysmal atrial fibrillation: Secondary | ICD-10-CM | POA: Diagnosis not present

## 2019-09-03 DIAGNOSIS — I5033 Acute on chronic diastolic (congestive) heart failure: Secondary | ICD-10-CM | POA: Diagnosis not present

## 2019-09-03 DIAGNOSIS — M5030 Other cervical disc degeneration, unspecified cervical region: Secondary | ICD-10-CM | POA: Diagnosis not present

## 2019-09-03 DIAGNOSIS — I110 Hypertensive heart disease with heart failure: Secondary | ICD-10-CM | POA: Diagnosis not present

## 2019-09-03 DIAGNOSIS — I69398 Other sequelae of cerebral infarction: Secondary | ICD-10-CM | POA: Diagnosis not present

## 2019-09-03 DIAGNOSIS — R29898 Other symptoms and signs involving the musculoskeletal system: Secondary | ICD-10-CM | POA: Diagnosis not present

## 2019-09-04 DIAGNOSIS — I480 Paroxysmal atrial fibrillation: Secondary | ICD-10-CM | POA: Diagnosis not present

## 2019-09-04 DIAGNOSIS — R29898 Other symptoms and signs involving the musculoskeletal system: Secondary | ICD-10-CM | POA: Diagnosis not present

## 2019-09-04 DIAGNOSIS — M5030 Other cervical disc degeneration, unspecified cervical region: Secondary | ICD-10-CM | POA: Diagnosis not present

## 2019-09-04 DIAGNOSIS — I110 Hypertensive heart disease with heart failure: Secondary | ICD-10-CM | POA: Diagnosis not present

## 2019-09-04 DIAGNOSIS — I5033 Acute on chronic diastolic (congestive) heart failure: Secondary | ICD-10-CM | POA: Diagnosis not present

## 2019-09-04 DIAGNOSIS — I69398 Other sequelae of cerebral infarction: Secondary | ICD-10-CM | POA: Diagnosis not present

## 2019-09-05 DIAGNOSIS — M1991 Primary osteoarthritis, unspecified site: Secondary | ICD-10-CM | POA: Diagnosis not present

## 2019-09-05 DIAGNOSIS — I110 Hypertensive heart disease with heart failure: Secondary | ICD-10-CM | POA: Diagnosis not present

## 2019-09-05 DIAGNOSIS — R29898 Other symptoms and signs involving the musculoskeletal system: Secondary | ICD-10-CM | POA: Diagnosis not present

## 2019-09-05 DIAGNOSIS — Z7901 Long term (current) use of anticoagulants: Secondary | ICD-10-CM | POA: Diagnosis not present

## 2019-09-05 DIAGNOSIS — J9611 Chronic respiratory failure with hypoxia: Secondary | ICD-10-CM | POA: Diagnosis not present

## 2019-09-05 DIAGNOSIS — I441 Atrioventricular block, second degree: Secondary | ICD-10-CM | POA: Diagnosis not present

## 2019-09-05 DIAGNOSIS — M8580 Other specified disorders of bone density and structure, unspecified site: Secondary | ICD-10-CM | POA: Diagnosis not present

## 2019-09-05 DIAGNOSIS — H9193 Unspecified hearing loss, bilateral: Secondary | ICD-10-CM | POA: Diagnosis not present

## 2019-09-05 DIAGNOSIS — M5030 Other cervical disc degeneration, unspecified cervical region: Secondary | ICD-10-CM | POA: Diagnosis not present

## 2019-09-05 DIAGNOSIS — R32 Unspecified urinary incontinence: Secondary | ICD-10-CM | POA: Diagnosis not present

## 2019-09-05 DIAGNOSIS — N390 Urinary tract infection, site not specified: Secondary | ICD-10-CM | POA: Diagnosis not present

## 2019-09-05 DIAGNOSIS — Z87891 Personal history of nicotine dependence: Secondary | ICD-10-CM | POA: Diagnosis not present

## 2019-09-05 DIAGNOSIS — Z86010 Personal history of colonic polyps: Secondary | ICD-10-CM | POA: Diagnosis not present

## 2019-09-05 DIAGNOSIS — I69398 Other sequelae of cerebral infarction: Secondary | ICD-10-CM | POA: Diagnosis not present

## 2019-09-05 DIAGNOSIS — Z9981 Dependence on supplemental oxygen: Secondary | ICD-10-CM | POA: Diagnosis not present

## 2019-09-05 DIAGNOSIS — F329 Major depressive disorder, single episode, unspecified: Secondary | ICD-10-CM | POA: Diagnosis not present

## 2019-09-05 DIAGNOSIS — Z8551 Personal history of malignant neoplasm of bladder: Secondary | ICD-10-CM | POA: Diagnosis not present

## 2019-09-05 DIAGNOSIS — I5033 Acute on chronic diastolic (congestive) heart failure: Secondary | ICD-10-CM | POA: Diagnosis not present

## 2019-09-05 DIAGNOSIS — Z9181 History of falling: Secondary | ICD-10-CM | POA: Diagnosis not present

## 2019-09-05 DIAGNOSIS — E039 Hypothyroidism, unspecified: Secondary | ICD-10-CM | POA: Diagnosis not present

## 2019-09-05 DIAGNOSIS — I480 Paroxysmal atrial fibrillation: Secondary | ICD-10-CM | POA: Diagnosis not present

## 2019-09-05 DIAGNOSIS — D649 Anemia, unspecified: Secondary | ICD-10-CM | POA: Diagnosis not present

## 2019-09-05 DIAGNOSIS — G8929 Other chronic pain: Secondary | ICD-10-CM | POA: Diagnosis not present

## 2019-09-10 DIAGNOSIS — I480 Paroxysmal atrial fibrillation: Secondary | ICD-10-CM | POA: Diagnosis not present

## 2019-09-10 DIAGNOSIS — I69398 Other sequelae of cerebral infarction: Secondary | ICD-10-CM | POA: Diagnosis not present

## 2019-09-10 DIAGNOSIS — M5030 Other cervical disc degeneration, unspecified cervical region: Secondary | ICD-10-CM | POA: Diagnosis not present

## 2019-09-10 DIAGNOSIS — I110 Hypertensive heart disease with heart failure: Secondary | ICD-10-CM | POA: Diagnosis not present

## 2019-09-10 DIAGNOSIS — R29898 Other symptoms and signs involving the musculoskeletal system: Secondary | ICD-10-CM | POA: Diagnosis not present

## 2019-09-10 DIAGNOSIS — I5033 Acute on chronic diastolic (congestive) heart failure: Secondary | ICD-10-CM | POA: Diagnosis not present

## 2019-09-11 DIAGNOSIS — I110 Hypertensive heart disease with heart failure: Secondary | ICD-10-CM | POA: Diagnosis not present

## 2019-09-11 DIAGNOSIS — R29898 Other symptoms and signs involving the musculoskeletal system: Secondary | ICD-10-CM | POA: Diagnosis not present

## 2019-09-11 DIAGNOSIS — M5030 Other cervical disc degeneration, unspecified cervical region: Secondary | ICD-10-CM | POA: Diagnosis not present

## 2019-09-11 DIAGNOSIS — I69398 Other sequelae of cerebral infarction: Secondary | ICD-10-CM | POA: Diagnosis not present

## 2019-09-11 DIAGNOSIS — I480 Paroxysmal atrial fibrillation: Secondary | ICD-10-CM | POA: Diagnosis not present

## 2019-09-11 DIAGNOSIS — I5033 Acute on chronic diastolic (congestive) heart failure: Secondary | ICD-10-CM | POA: Diagnosis not present

## 2019-09-12 DIAGNOSIS — I480 Paroxysmal atrial fibrillation: Secondary | ICD-10-CM | POA: Diagnosis not present

## 2019-09-12 DIAGNOSIS — M5030 Other cervical disc degeneration, unspecified cervical region: Secondary | ICD-10-CM | POA: Diagnosis not present

## 2019-09-12 DIAGNOSIS — I69398 Other sequelae of cerebral infarction: Secondary | ICD-10-CM | POA: Diagnosis not present

## 2019-09-12 DIAGNOSIS — I110 Hypertensive heart disease with heart failure: Secondary | ICD-10-CM | POA: Diagnosis not present

## 2019-09-12 DIAGNOSIS — R29898 Other symptoms and signs involving the musculoskeletal system: Secondary | ICD-10-CM | POA: Diagnosis not present

## 2019-09-12 DIAGNOSIS — I5033 Acute on chronic diastolic (congestive) heart failure: Secondary | ICD-10-CM | POA: Diagnosis not present

## 2019-09-18 DIAGNOSIS — I480 Paroxysmal atrial fibrillation: Secondary | ICD-10-CM | POA: Diagnosis not present

## 2019-09-18 DIAGNOSIS — I69398 Other sequelae of cerebral infarction: Secondary | ICD-10-CM | POA: Diagnosis not present

## 2019-09-18 DIAGNOSIS — I5033 Acute on chronic diastolic (congestive) heart failure: Secondary | ICD-10-CM | POA: Diagnosis not present

## 2019-09-18 DIAGNOSIS — M5030 Other cervical disc degeneration, unspecified cervical region: Secondary | ICD-10-CM | POA: Diagnosis not present

## 2019-09-18 DIAGNOSIS — I110 Hypertensive heart disease with heart failure: Secondary | ICD-10-CM | POA: Diagnosis not present

## 2019-09-18 DIAGNOSIS — R29898 Other symptoms and signs involving the musculoskeletal system: Secondary | ICD-10-CM | POA: Diagnosis not present

## 2019-09-19 DIAGNOSIS — I5033 Acute on chronic diastolic (congestive) heart failure: Secondary | ICD-10-CM | POA: Diagnosis not present

## 2019-09-19 DIAGNOSIS — M5030 Other cervical disc degeneration, unspecified cervical region: Secondary | ICD-10-CM | POA: Diagnosis not present

## 2019-09-19 DIAGNOSIS — I110 Hypertensive heart disease with heart failure: Secondary | ICD-10-CM | POA: Diagnosis not present

## 2019-09-19 DIAGNOSIS — R29898 Other symptoms and signs involving the musculoskeletal system: Secondary | ICD-10-CM | POA: Diagnosis not present

## 2019-09-19 DIAGNOSIS — I480 Paroxysmal atrial fibrillation: Secondary | ICD-10-CM | POA: Diagnosis not present

## 2019-09-19 DIAGNOSIS — I69398 Other sequelae of cerebral infarction: Secondary | ICD-10-CM | POA: Diagnosis not present

## 2019-09-24 DIAGNOSIS — I110 Hypertensive heart disease with heart failure: Secondary | ICD-10-CM | POA: Diagnosis not present

## 2019-09-24 DIAGNOSIS — I480 Paroxysmal atrial fibrillation: Secondary | ICD-10-CM | POA: Diagnosis not present

## 2019-09-24 DIAGNOSIS — M5030 Other cervical disc degeneration, unspecified cervical region: Secondary | ICD-10-CM | POA: Diagnosis not present

## 2019-09-24 DIAGNOSIS — I5033 Acute on chronic diastolic (congestive) heart failure: Secondary | ICD-10-CM | POA: Diagnosis not present

## 2019-09-24 DIAGNOSIS — I69398 Other sequelae of cerebral infarction: Secondary | ICD-10-CM | POA: Diagnosis not present

## 2019-09-24 DIAGNOSIS — R29898 Other symptoms and signs involving the musculoskeletal system: Secondary | ICD-10-CM | POA: Diagnosis not present

## 2019-09-30 DIAGNOSIS — I5033 Acute on chronic diastolic (congestive) heart failure: Secondary | ICD-10-CM | POA: Diagnosis not present

## 2019-09-30 DIAGNOSIS — I480 Paroxysmal atrial fibrillation: Secondary | ICD-10-CM | POA: Diagnosis not present

## 2019-09-30 DIAGNOSIS — I69398 Other sequelae of cerebral infarction: Secondary | ICD-10-CM | POA: Diagnosis not present

## 2019-09-30 DIAGNOSIS — I110 Hypertensive heart disease with heart failure: Secondary | ICD-10-CM | POA: Diagnosis not present

## 2019-09-30 DIAGNOSIS — R29898 Other symptoms and signs involving the musculoskeletal system: Secondary | ICD-10-CM | POA: Diagnosis not present

## 2019-09-30 DIAGNOSIS — M5030 Other cervical disc degeneration, unspecified cervical region: Secondary | ICD-10-CM | POA: Diagnosis not present

## 2019-10-01 ENCOUNTER — Telehealth: Payer: Self-pay | Admitting: *Deleted

## 2019-10-01 DIAGNOSIS — I110 Hypertensive heart disease with heart failure: Secondary | ICD-10-CM | POA: Diagnosis not present

## 2019-10-01 DIAGNOSIS — I5033 Acute on chronic diastolic (congestive) heart failure: Secondary | ICD-10-CM | POA: Diagnosis not present

## 2019-10-01 DIAGNOSIS — I69398 Other sequelae of cerebral infarction: Secondary | ICD-10-CM | POA: Diagnosis not present

## 2019-10-01 DIAGNOSIS — R29898 Other symptoms and signs involving the musculoskeletal system: Secondary | ICD-10-CM | POA: Diagnosis not present

## 2019-10-01 DIAGNOSIS — M5030 Other cervical disc degeneration, unspecified cervical region: Secondary | ICD-10-CM | POA: Diagnosis not present

## 2019-10-01 DIAGNOSIS — I480 Paroxysmal atrial fibrillation: Secondary | ICD-10-CM | POA: Diagnosis not present

## 2019-10-01 NOTE — Telephone Encounter (Signed)
A message was left, re: her follow up visit. 

## 2019-10-07 ENCOUNTER — Telehealth: Payer: Self-pay

## 2019-10-07 ENCOUNTER — Telehealth (INDEPENDENT_AMBULATORY_CARE_PROVIDER_SITE_OTHER): Payer: Medicare Other | Admitting: Cardiology

## 2019-10-07 ENCOUNTER — Encounter: Payer: Self-pay | Admitting: Cardiology

## 2019-10-07 VITALS — HR 70 | Ht 63.0 in | Wt 169.0 lb

## 2019-10-07 DIAGNOSIS — E876 Hypokalemia: Secondary | ICD-10-CM

## 2019-10-07 DIAGNOSIS — Z7901 Long term (current) use of anticoagulants: Secondary | ICD-10-CM

## 2019-10-07 DIAGNOSIS — I1310 Hypertensive heart and chronic kidney disease without heart failure, with stage 1 through stage 4 chronic kidney disease, or unspecified chronic kidney disease: Secondary | ICD-10-CM

## 2019-10-07 DIAGNOSIS — R001 Bradycardia, unspecified: Secondary | ICD-10-CM

## 2019-10-07 DIAGNOSIS — Z8673 Personal history of transient ischemic attack (TIA), and cerebral infarction without residual deficits: Secondary | ICD-10-CM

## 2019-10-07 DIAGNOSIS — I480 Paroxysmal atrial fibrillation: Secondary | ICD-10-CM

## 2019-10-07 NOTE — Progress Notes (Signed)
Virtual Visit via Telephone Note   This visit type was conducted due to national recommendations for restrictions regarding the COVID-19 Pandemic (e.g. social distancing) in an effort to limit this patient's exposure and mitigate transmission in our community.  Due to her co-morbid illnesses, this patient is at least at moderate risk for complications without adequate follow up.  This format is felt to be most appropriate for this patient at this time.  The patient did not have access to video technology/had technical difficulties with video requiring transitioning to audio format only (telephone).  All issues noted in this document were discussed and addressed.  No physical exam could be performed with this format.  Please refer to the patient's chart for her  consent to telehealth for [facility].   Date:  10/07/2019   ID:  Rebecca Norman, DOB [birth year], MRN [id number]  Patient Location: Home Provider Location: Home  PCP:  [name], MD  Cardiologist:  Kirk Ruths, MD  Electrophysiologist:  None   Evaluation Performed:  Follow-Up Visit  Chief Complaint:  weakness  History of Present Illness:    Rebecca Norman is a pleasant 86 y.o. female with a hx of PAF, previously maintained on amiodarone. She was admitted 05/08/2019 with generalized weakness and a history of falls. On admission her heart rate was thought to be in the 40s, on review it was felt that her heart rhythm was normal sinus rhythm in the 50s with PACs and blocked PACs. It was not felt that that was the cause of her weakness and  It was recommended a neurology consult be obtained. An MRI was done which did show acute infarcts in the right pons, right cerebellum, right cortex and left putamen. There were scattered areas of hemorrhage which could be subacute versus chronic. 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  The patient was monitored on telemetry and continued to have bradycardia. It was  ultimately decided to stop the patient's amiodarone. She was discharged to [facility] for rehab and then discharged home.  She lives alone but her son lives close by and she help that comes in in the mornings.  She complains of weakness and is afraid of falling.  I contacted her today for a f/u.  She feels basically unchanged.  Her daughter is an Therapist, sports but was unable to be present during this call for medical reasons.   The patient does not have symptoms concerning for COVID-19 infection (fever, chills, cough, or new shortness of breath).    Past Medical History:  Diagnosis Date  . DDD (degenerative disc disease), cervical    severe/  auto fusion c2-c5  . Diverticulosis   . Hemorrhoids   . History of adenomatous polyp of colon    1995  adenomatous polyp/  1997 & 2003  hyperplastic polyp's  . History of bladder cancer    s/p  TURBT's, urologist-  Dr [name]  . History of cancer of ureter   . Hypertension   . Hypothyroid   . Nocturia   . Osteoarthritis   . Osteopenia   . Symptomatic bradycardia 05/06/2019   Past Surgical History:  Procedure Laterality Date  . BACK SURGERY    . CARDIOVERSION N/A 08/09/2017   Procedure: CARDIOVERSION;  Surgeon: Larey Dresser, MD;  Location: [facility];  Service: Cardiovascular;  Laterality: N/A;  . CATARACT EXTRACTION W/ INTRAOCULAR LENS  IMPLANT, BILATERAL Bilateral   . CYSTOSCOPY W/ RETROGRADES  10/03/2011   Procedure: CYSTOSCOPY WITH RETROGRADE  [email or web address];  Surgeon: Molli Hazard, MD;  Location: [facility];  Service: Urology;;  . Consuela Mimes W/ RETROGRADES Bilateral 06/17/2016   Procedure: CYSTOSCOPY WITH RETROGRADE PYELOGRAM;  Surgeon: Alexis Frock, MD;  Location: [facility];  Service: Urology;  Laterality: Bilateral;  . CYSTOSCOPY WITH BIOPSY N/A 06/17/2016   Procedure: CYSTOSCOPY WITH BIOPSY AND FULGERATION;  Surgeon: Alexis Frock, MD;  Location: [facility];  Service: Urology;   Laterality: N/A;  . LAMINECTOMY  2009   L3 - 4  . ORIF ANKLE FRACTURE Left 08/28/2017   Procedure: OPEN REDUCTION INTERNAL FIXATION (ORIF) ANKLE FRACTURE;  Surgeon: Shona Needles, MD;  Location: [facility];  Service: Orthopedics;  Laterality: Left;  . TRANSURETHRAL RESECTION OF BLADDER  x3 --  2009; 2010; 06-29-2010  . TRANSURETHRAL RESECTION OF BLADDER TUMOR  10/03/2011   Procedure: TRANSURETHRAL RESECTION OF BLADDER TUMOR (TURBT);  Surgeon: Molli Hazard, MD;  Location: [facility];  Service: Urology;  Laterality: N/A;     Current Meds  Medication Sig  . acetaminophen (TYLENOL) 500 MG tablet Take 500 mg by mouth every 6 (six) hours as needed for headache (pain).   . furosemide (LASIX) 20 MG tablet Take 1 tablet (20 mg total) by mouth daily.  Marland Kitchen levothyroxine (SYNTHROID, LEVOTHROID) 50 MCG tablet Take 50-75 mcg by mouth See admin instructions. Take every other alternating between 61mcg and 41mcg.  Marland Kitchen oxybutynin (DITROPAN) 5 MG tablet Take 5 mg by mouth at bedtime as needed.  . OXYGEN Inhale 4.5 L into the lungs continuous.   . sertraline (ZOLOFT) 50 MG tablet Take 50 mg by mouth at bedtime.      Allergies:   Flagyl [metronidazole] and Lisinopril   Social History   Tobacco Use  . Smoking status: Former Smoker    Years: 10.00    Types: Cigarettes    Quit date: 09/25/2005    Years since quitting: 14.0  . Smokeless tobacco: Never Used  Substance Use Topics  . Alcohol use: Yes  . Drug use: No     Family Hx: The patient's family history includes Breast cancer in her sister; Hernia (age of onset: 39) in her sister; Stomach cancer in her sister. There is no history of Colon cancer.  ROS:   Please see the history of present illness.    All other systems reviewed and are negative.   Prior CV studies:   The following studies were reviewed today: Echo 05/07/2019  Labs/Other Tests and Data Reviewed:    EKG:  No ECG reviewed.  Recent Labs: 04/29/2019: TSH 4.530  05/06/2019: B Natriuretic Peptide 1,032.4; Magnesium 1.9 05/07/2019: ALT 10 05/10/2019: BUN 24; Creatinine, Ser 0.93; Potassium 4.8; Sodium 135 05/11/2019: Hemoglobin 10.8; Platelets 266   Recent Lipid Panel Lab Results  Component Value Date/Time   CHOL 127 05/09/2019 03:52 AM   TRIG 110 05/09/2019 03:52 AM   HDL 40 (L) 05/09/2019 03:52 AM   CHOLHDL 3.2 05/09/2019 03:52 AM   LDLCALC 65 05/09/2019 03:52 AM    Wt Readings from Last 3 Encounters:  10/07/19 169 lb (76.7 kg)  07/30/19 167 lb (75.8 kg)  05/31/19 166 lb (75.3 kg)     Objective:    Vital Signs:  Pulse 70   Ht 5\' 3"  (1.6 m)   Wt 169 lb (76.7 kg)   SpO2 95%   BMI 29.94 kg/m    VITAL SIGNS:  reviewed  ASSESSMENT & PLAN:    History of embolic stroke ([place])  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  Amiodarone was discontinued.HR now 70  Chronic diastolic CHF (congestive heart failure) (HCC) Stable-she is only taking Lasix 20 mg daily  Paroxysmal atrial fibrillation (HCC) Previously on Amiodarone-  Current use of long term anticoagulation CHADS VASC=5- on Eliquis 5 mg BID based on her SCr and weight   COVID-19 Education: The signs and symptoms of COVID-19 were discussed with the patient and how to seek care for testing (follow up with PCP or arrange E-visit).  The importance of social distancing was discussed today.  Time:   Today, I have spent 10 minutes with the patient with telehealth technology discussing the above problems.     Medication Adjustments/Labs and Tests Ordered: Current medicines are reviewed at length with the patient today.  Concerns regarding medicines are outlined above.   Tests Ordered: No orders of the defined types were placed in this encounter.   Medication Changes: No orders of the defined types were placed in this encounter.   Follow Up:   Either In Person or Virtual 6 months with me or Dr [name]  Signed, Kerin Ransom, PA-C  10/07/2019 2:23 PM    [place]

## 2019-10-07 NOTE — Patient Instructions (Signed)
Medication Instructions:  Your physician recommends that you continue on your current medications as directed. Please refer to the Current Medication list given to you today. *If you need a refill on your cardiac medications before your next appointment, please call your pharmacy*  Lab Work: Your physician recommends that you return for lab work in: [place] WEEK-BMET If you have labs (blood work) drawn today and your tests are completely normal, you will receive your results only by: Marland Kitchen MyChart Message (if you have MyChart) OR . A paper copy in the mail If you have any lab test that is abnormal or we need to change your treatment, we will call you to review the results.  Testing/Procedures: None   Follow-Up: At [facility], you and your health needs are our priority.  As part of our continuing mission to provide you with exceptional heart care, we have created designated Provider Care Teams.  These Care Teams include your primary Cardiologist (physician) and Advanced Practice Providers (APPs -  Physician Assistants and Nurse Practitioners) who all work together to provide you with the care you need, when you need it.  Your next appointment:   6 month(s)  The format for your next appointment:   Virtual Visit   Provider:   You may see Kirk Ruths, MD or one of the following Advanced Practice Providers on your designated Care Team:    Kerin Ransom, PA-C  [place], [place]  Coletta Memos, [place]   Other Instructions

## 2019-10-07 NOTE — Telephone Encounter (Signed)
Contacted patient to discuss AVS Instructions. Gave patient Luke's recommendations from today's virtual office visit. Informed patient that someone from the scheduling dept will be in contact with them to schedule their follow up appt. Patient voiced understanding; AVS printed and mailed to patient.    

## 2019-10-14 ENCOUNTER — Telehealth: Payer: Self-pay | Admitting: Cardiology

## 2019-10-14 ENCOUNTER — Telehealth: Payer: Self-pay | Admitting: *Deleted

## 2019-10-14 NOTE — Telephone Encounter (Signed)
New Message  Patient's daughter [name] is calling in to get an order for a home health nurse to come out to draw labs on patient. Please call back to discuss.

## 2019-10-14 NOTE — Telephone Encounter (Signed)
Routed to primary nurse, however BMET was ordered by [place] PA

## 2019-10-14 NOTE — Telephone Encounter (Signed)
[place] Visit Initial Request  Date of Request ([place]):  October 14, 2019  Requesting Provider:  DR. Kirk Ruths    Agency Requested:    [facility] Contact:  Joen Laura, [place], [place], [place], [place]  [address] [email or web address]  Phone #: [phone] Fax #: [phone]  Patient Demographic Information: Name:  DANIEL DESJARDIN Age:  86 y.o.   DOB:  [birth year]  MRN:  [id number]   Address:   [address] [place] [address]   Phone Numbers:   Home Phone [phone]  Mobile [phone]     Emergency Contact Information on File:   Contact Information    Name Relation Home Work [address]   [place], [place]   [phone]   [name] Daughter [phone]  [phone]      The above family members may be contacted for information on this patient (review DPR on file):  Yes    Patient Clinical Information:  Primary Care Provider:  [name], MD  Primary Cardiologist:  Kirk Ruths, MD  Primary Electrophysiologist:  None   Past Medical Hx: Ms. Kesinger  has a past medical history of DDD (degenerative disc disease), cervical, Diverticulosis, Hemorrhoids, History of adenomatous polyp of colon, History of bladder cancer, History of cancer of ureter, Hypertension, Hypothyroid, Nocturia, Osteoarthritis, Osteopenia, and Symptomatic bradycardia (05/06/2019).   Allergies: She is allergic to flagyl [metronidazole] and lisinopril.   Medications: Current Outpatient Medications on File Prior to Visit  Medication Sig  . acetaminophen (TYLENOL) 500 MG tablet Take 500 mg by mouth every 6 (six) hours as needed for headache (pain).   Marland Kitchen ELIQUIS 5 MG TABS tablet TAKE 1 TABLET TWICE A DAY  . furosemide (LASIX) 20 MG tablet Take 1 tablet (20 mg total) by mouth daily.  Marland Kitchen levothyroxine (SYNTHROID, LEVOTHROID) 50 MCG tablet Take 50-75 mcg by mouth See admin instructions. Take every other  alternating between 46mcg and 60mcg.  Marland Kitchen oxybutynin (DITROPAN) 5 MG tablet Take 5 mg by mouth at bedtime as needed.  . OXYGEN Inhale 4.5 L into the lungs continuous.   . sertraline (ZOLOFT) 50 MG tablet Take 50 mg by mouth at bedtime.    No current facility-administered medications on file prior to visit.     Social Hx: She  reports that she quit smoking about 14 years ago. Her smoking use included cigarettes. She quit after 10.00 years of use. She has never used smokeless tobacco. She reports current alcohol use. She reports that she does not use drugs.    Diagnosis/Reason for Visit:   CARDIORENAL SYNDROME WITH RENAL FAILURE LAB WORK TO BE THIS WEEK (BMP)  Services Requested:  Vital Signs (BP, Pulse, O2, Weight)  Physical Exam  Medication Reconciliation  Labs:  BMP  # of Visits Needed/Frequency per Week: 5 VISITS

## 2019-10-14 NOTE — Telephone Encounter (Signed)
Spoke with pt daughter, the patient was recently discharged by [facility].

## 2019-10-15 NOTE — Telephone Encounter (Signed)
Arrangements made for labs work to be drawn.

## 2019-10-16 ENCOUNTER — Telehealth: Payer: Self-pay | Admitting: Cardiology

## 2019-10-16 NOTE — Telephone Encounter (Signed)
Follow Up:    Rebecca Norman from [facility] called. She wanted Dr [name] to know that they will not be able to start the At [place] until 10-18-19.

## 2019-10-16 NOTE — Telephone Encounter (Signed)
Spoke with maggie, aware okay for labs to be done on the 12th.

## 2019-10-18 ENCOUNTER — Telehealth: Payer: Self-pay | Admitting: Cardiology

## 2019-10-18 DIAGNOSIS — M1991 Primary osteoarthritis, unspecified site: Secondary | ICD-10-CM | POA: Diagnosis not present

## 2019-10-18 DIAGNOSIS — Z8554 Personal history of malignant neoplasm of ureter: Secondary | ICD-10-CM | POA: Diagnosis not present

## 2019-10-18 DIAGNOSIS — M5030 Other cervical disc degeneration, unspecified cervical region: Secondary | ICD-10-CM | POA: Diagnosis not present

## 2019-10-18 DIAGNOSIS — Z9981 Dependence on supplemental oxygen: Secondary | ICD-10-CM | POA: Diagnosis not present

## 2019-10-18 DIAGNOSIS — I480 Paroxysmal atrial fibrillation: Secondary | ICD-10-CM | POA: Diagnosis not present

## 2019-10-18 DIAGNOSIS — J449 Chronic obstructive pulmonary disease, unspecified: Secondary | ICD-10-CM | POA: Diagnosis not present

## 2019-10-18 DIAGNOSIS — I5032 Chronic diastolic (congestive) heart failure: Secondary | ICD-10-CM | POA: Diagnosis not present

## 2019-10-18 DIAGNOSIS — R351 Nocturia: Secondary | ICD-10-CM | POA: Diagnosis not present

## 2019-10-18 DIAGNOSIS — I10 Essential (primary) hypertension: Secondary | ICD-10-CM | POA: Diagnosis not present

## 2019-10-18 DIAGNOSIS — Z86010 Personal history of colonic polyps: Secondary | ICD-10-CM | POA: Diagnosis not present

## 2019-10-18 DIAGNOSIS — Z8673 Personal history of transient ischemic attack (TIA), and cerebral infarction without residual deficits: Secondary | ICD-10-CM | POA: Diagnosis not present

## 2019-10-18 DIAGNOSIS — I110 Hypertensive heart disease with heart failure: Secondary | ICD-10-CM | POA: Diagnosis not present

## 2019-10-18 DIAGNOSIS — Z87891 Personal history of nicotine dependence: Secondary | ICD-10-CM | POA: Diagnosis not present

## 2019-10-18 DIAGNOSIS — Z23 Encounter for immunization: Secondary | ICD-10-CM | POA: Diagnosis not present

## 2019-10-18 DIAGNOSIS — E039 Hypothyroidism, unspecified: Secondary | ICD-10-CM | POA: Diagnosis not present

## 2019-10-18 DIAGNOSIS — Z8551 Personal history of malignant neoplasm of bladder: Secondary | ICD-10-CM | POA: Diagnosis not present

## 2019-10-18 DIAGNOSIS — Z7901 Long term (current) use of anticoagulants: Secondary | ICD-10-CM | POA: Diagnosis not present

## 2019-10-18 DIAGNOSIS — M8580 Other specified disorders of bone density and structure, unspecified site: Secondary | ICD-10-CM | POA: Diagnosis not present

## 2019-10-18 DIAGNOSIS — K5790 Diverticulosis of intestine, part unspecified, without perforation or abscess without bleeding: Secondary | ICD-10-CM | POA: Diagnosis not present

## 2019-10-18 DIAGNOSIS — Z9181 History of falling: Secondary | ICD-10-CM | POA: Diagnosis not present

## 2019-10-18 NOTE — Telephone Encounter (Signed)
Rebecca Norman is calling for nursing orders of CHF management.

## 2019-10-23 DIAGNOSIS — J449 Chronic obstructive pulmonary disease, unspecified: Secondary | ICD-10-CM | POA: Diagnosis not present

## 2019-10-23 DIAGNOSIS — I5032 Chronic diastolic (congestive) heart failure: Secondary | ICD-10-CM | POA: Diagnosis not present

## 2019-10-23 DIAGNOSIS — M5030 Other cervical disc degeneration, unspecified cervical region: Secondary | ICD-10-CM | POA: Diagnosis not present

## 2019-10-23 DIAGNOSIS — I110 Hypertensive heart disease with heart failure: Secondary | ICD-10-CM | POA: Diagnosis not present

## 2019-10-23 DIAGNOSIS — I480 Paroxysmal atrial fibrillation: Secondary | ICD-10-CM | POA: Diagnosis not present

## 2019-10-23 DIAGNOSIS — K5790 Diverticulosis of intestine, part unspecified, without perforation or abscess without bleeding: Secondary | ICD-10-CM | POA: Diagnosis not present

## 2019-10-29 DIAGNOSIS — E039 Hypothyroidism, unspecified: Secondary | ICD-10-CM | POA: Diagnosis not present

## 2019-10-29 DIAGNOSIS — Z8673 Personal history of transient ischemic attack (TIA), and cerebral infarction without residual deficits: Secondary | ICD-10-CM | POA: Diagnosis not present

## 2019-10-29 DIAGNOSIS — F322 Major depressive disorder, single episode, severe without psychotic features: Secondary | ICD-10-CM | POA: Diagnosis not present

## 2019-10-29 DIAGNOSIS — N3281 Overactive bladder: Secondary | ICD-10-CM | POA: Diagnosis not present

## 2019-10-29 DIAGNOSIS — I5030 Unspecified diastolic (congestive) heart failure: Secondary | ICD-10-CM | POA: Diagnosis not present

## 2019-10-29 DIAGNOSIS — R1310 Dysphagia, unspecified: Secondary | ICD-10-CM | POA: Diagnosis not present

## 2019-10-29 DIAGNOSIS — D6869 Other thrombophilia: Secondary | ICD-10-CM | POA: Diagnosis not present

## 2019-10-29 DIAGNOSIS — D519 Vitamin B12 deficiency anemia, unspecified: Secondary | ICD-10-CM | POA: Diagnosis not present

## 2019-10-29 DIAGNOSIS — Z7189 Other specified counseling: Secondary | ICD-10-CM | POA: Diagnosis not present

## 2019-10-29 DIAGNOSIS — I4891 Unspecified atrial fibrillation: Secondary | ICD-10-CM | POA: Diagnosis not present

## 2019-10-29 DIAGNOSIS — I110 Hypertensive heart disease with heart failure: Secondary | ICD-10-CM | POA: Diagnosis not present

## 2019-10-30 ENCOUNTER — Telehealth: Payer: Self-pay | Admitting: Cardiology

## 2019-10-30 NOTE — Telephone Encounter (Signed)
Spoke with maggie at [facility], she will fax lab results

## 2019-10-30 NOTE — Telephone Encounter (Signed)
Amy from Dr. Joaquin Music office calling to get patient's lab results faxed over whenever the results come in.   Fax number: [phone]

## 2019-11-01 DIAGNOSIS — I110 Hypertensive heart disease with heart failure: Secondary | ICD-10-CM | POA: Diagnosis not present

## 2019-11-01 DIAGNOSIS — K5790 Diverticulosis of intestine, part unspecified, without perforation or abscess without bleeding: Secondary | ICD-10-CM | POA: Diagnosis not present

## 2019-11-01 DIAGNOSIS — M5030 Other cervical disc degeneration, unspecified cervical region: Secondary | ICD-10-CM | POA: Diagnosis not present

## 2019-11-01 DIAGNOSIS — I480 Paroxysmal atrial fibrillation: Secondary | ICD-10-CM | POA: Diagnosis not present

## 2019-11-01 DIAGNOSIS — I5032 Chronic diastolic (congestive) heart failure: Secondary | ICD-10-CM | POA: Diagnosis not present

## 2019-11-01 DIAGNOSIS — J449 Chronic obstructive pulmonary disease, unspecified: Secondary | ICD-10-CM | POA: Diagnosis not present

## 2019-11-06 ENCOUNTER — Encounter: Payer: Self-pay | Admitting: Podiatry

## 2019-11-06 ENCOUNTER — Other Ambulatory Visit: Payer: Self-pay

## 2019-11-06 ENCOUNTER — Ambulatory Visit (INDEPENDENT_AMBULATORY_CARE_PROVIDER_SITE_OTHER): Payer: Medicare Other | Admitting: Podiatry

## 2019-11-06 DIAGNOSIS — M79674 Pain in right toe(s): Secondary | ICD-10-CM | POA: Diagnosis not present

## 2019-11-06 DIAGNOSIS — M79675 Pain in left toe(s): Secondary | ICD-10-CM

## 2019-11-06 DIAGNOSIS — D689 Coagulation defect, unspecified: Secondary | ICD-10-CM

## 2019-11-06 DIAGNOSIS — B351 Tinea unguium: Secondary | ICD-10-CM | POA: Diagnosis not present

## 2019-11-06 DIAGNOSIS — Q828 Other specified congenital malformations of skin: Secondary | ICD-10-CM | POA: Insufficient documentation

## 2019-11-06 NOTE — Progress Notes (Signed)
Complaint:  Visit Type: Patient returns to my office for continued preventative foot care services. Complaint: Patient states" my nails have grown long and thick and become painful to walk and wear shoes".  Patient presents in a wheelchair with her daughter. . The patient presents for preventative foot care services. No changes to ROS.    Podiatric Exam: Vascular: dorsalis pedis are palpable bilateral.  Posterior tibial pulses are non palpable. Capillary return is immediate. Temperature gradient is WNL. Skin turgor WNL  Sensorium: Normal Semmes Weinstein monofilament test. Normal tactile sensation bilaterally. Nail Exam: Pt has thick disfigured discolored nails with subungual debris noted bilateral entire nail hallux through fifth toenails Ulcer Exam: There is no evidence of ulcer or pre-ulcerative changes or infection. Orthopedic Exam: Muscle tone and strength are WNL. No limitations in general ROM. No crepitus or effusions noted. Foot type and digits show no abnormalities  HAV  B/L.  Overlapping second digitB/L. Skin: No Porokeratosis. No infection or ulcers.  Porokeratosis sub 3 left foot.  Diagnosis:  Onychomycosis, , Pain in right toe, pain in left toes,  Porokeratosis  Treatment & Plan Procedures and Treatment: Consent by patient was obtained for treatment procedures.   Debridement of mycotic and hypertrophic toenails, 1 through 5 bilateral and clearing of subungual debris. No ulceration, no infection noted. Debride porokeratosis. Return Visit-Office Procedure: Patient instructed to return to the office for a follow up visit 3 months for continued evaluation and treatment.    Gardiner Barefoot DPM

## 2019-11-06 NOTE — Telephone Encounter (Signed)
Labs received and faxed to the number provided.

## 2019-11-14 ENCOUNTER — Telehealth: Payer: Self-pay | Admitting: Cardiology

## 2019-11-14 NOTE — Telephone Encounter (Signed)
Spoke with pt son, aware no reason for the call as far as we are concerned.

## 2019-11-14 NOTE — Telephone Encounter (Signed)
Patient's son calling in, states he received a call but not sure why or what it was in regards to. His mother is the only one who is a patient of ours. Not sure what the call was in regards to.

## 2019-11-15 DIAGNOSIS — Z23 Encounter for immunization: Secondary | ICD-10-CM | POA: Diagnosis not present

## 2019-11-17 DIAGNOSIS — I10 Essential (primary) hypertension: Secondary | ICD-10-CM | POA: Diagnosis not present

## 2019-12-03 DIAGNOSIS — H5010 Unspecified exotropia: Secondary | ICD-10-CM | POA: Diagnosis not present

## 2019-12-03 DIAGNOSIS — H532 Diplopia: Secondary | ICD-10-CM | POA: Diagnosis not present

## 2019-12-11 ENCOUNTER — Telehealth: Payer: Self-pay | Admitting: Cardiology

## 2019-12-11 NOTE — Telephone Encounter (Signed)
Called and spoke with Joycelyn Schmid from [facility], she states she received a call from Rebecca Norman yesterday stated that she felt she would benefit from cardiac skilled nursing.  Joycelyn Schmid spoke with her supervisor and they thought it would be best for them to d/c her current chart and get new orders for a new start of care from [facility]. Notified that Dr.Crenshaw is out of office at the moment but would route the message to make him aware.  Joycelyn Schmid stated she could take a verbal order over the phone at [phone] or orders could be faxed to [phone]  Will route message to Dr.Crenshaw.

## 2019-12-11 NOTE — Telephone Encounter (Signed)
New message   Per Joycelyn Schmid is calling new orders for the patient on starter care. Please call to discuss.

## 2019-12-11 NOTE — Telephone Encounter (Signed)
Not sure what skilled cardiac care is. Can continue present care from my standpoint Kirk Ruths

## 2019-12-16 NOTE — Telephone Encounter (Signed)
Spoke with Rebecca Norman and she is wanting orders from Dr [name] to restart home care with CHF education, she is unsure who started patient with home care. Patients HHN left abruptly and didn't tell anyone patient needed renewal in orders  Will forward to Dr [name] for review

## 2019-12-16 NOTE — Telephone Encounter (Signed)
Verbal order given to Margaret 

## 2019-12-16 NOTE — Telephone Encounter (Signed)
[place] for orders as needed. Kirk Ruths

## 2019-12-21 DIAGNOSIS — I110 Hypertensive heart disease with heart failure: Secondary | ICD-10-CM | POA: Diagnosis not present

## 2019-12-21 DIAGNOSIS — M5030 Other cervical disc degeneration, unspecified cervical region: Secondary | ICD-10-CM | POA: Diagnosis not present

## 2019-12-21 DIAGNOSIS — Z8554 Personal history of malignant neoplasm of ureter: Secondary | ICD-10-CM | POA: Diagnosis not present

## 2019-12-21 DIAGNOSIS — M8580 Other specified disorders of bone density and structure, unspecified site: Secondary | ICD-10-CM | POA: Diagnosis not present

## 2019-12-21 DIAGNOSIS — J449 Chronic obstructive pulmonary disease, unspecified: Secondary | ICD-10-CM | POA: Diagnosis not present

## 2019-12-21 DIAGNOSIS — K5790 Diverticulosis of intestine, part unspecified, without perforation or abscess without bleeding: Secondary | ICD-10-CM | POA: Diagnosis not present

## 2019-12-21 DIAGNOSIS — E039 Hypothyroidism, unspecified: Secondary | ICD-10-CM | POA: Diagnosis not present

## 2019-12-21 DIAGNOSIS — Z7901 Long term (current) use of anticoagulants: Secondary | ICD-10-CM | POA: Diagnosis not present

## 2019-12-21 DIAGNOSIS — Z86010 Personal history of colonic polyps: Secondary | ICD-10-CM | POA: Diagnosis not present

## 2019-12-21 DIAGNOSIS — R351 Nocturia: Secondary | ICD-10-CM | POA: Diagnosis not present

## 2019-12-21 DIAGNOSIS — Z8551 Personal history of malignant neoplasm of bladder: Secondary | ICD-10-CM | POA: Diagnosis not present

## 2019-12-21 DIAGNOSIS — Z9981 Dependence on supplemental oxygen: Secondary | ICD-10-CM | POA: Diagnosis not present

## 2019-12-21 DIAGNOSIS — I5032 Chronic diastolic (congestive) heart failure: Secondary | ICD-10-CM | POA: Diagnosis not present

## 2019-12-21 DIAGNOSIS — Z8673 Personal history of transient ischemic attack (TIA), and cerebral infarction without residual deficits: Secondary | ICD-10-CM | POA: Diagnosis not present

## 2019-12-21 DIAGNOSIS — Z9181 History of falling: Secondary | ICD-10-CM | POA: Diagnosis not present

## 2019-12-21 DIAGNOSIS — M1991 Primary osteoarthritis, unspecified site: Secondary | ICD-10-CM | POA: Diagnosis not present

## 2019-12-21 DIAGNOSIS — I480 Paroxysmal atrial fibrillation: Secondary | ICD-10-CM | POA: Diagnosis not present

## 2019-12-21 DIAGNOSIS — Z87891 Personal history of nicotine dependence: Secondary | ICD-10-CM | POA: Diagnosis not present

## 2019-12-23 NOTE — Telephone Encounter (Signed)
Relayed verbal order to [place]. [facility] is working with patient on meds and CHF

## 2019-12-23 NOTE — Telephone Encounter (Signed)
Rebecca Norman from [facility] is calling for verbal approval for continuance of care. She states if approval is given it will consist of nursing once a week for 1 week, twice a week for 2 weeks, & then once a week for 1 week. Please advise.

## 2019-12-24 DIAGNOSIS — N8111 Cystocele, midline: Secondary | ICD-10-CM | POA: Diagnosis not present

## 2019-12-24 DIAGNOSIS — C670 Malignant neoplasm of trigone of bladder: Secondary | ICD-10-CM | POA: Diagnosis not present

## 2019-12-26 ENCOUNTER — Telehealth: Payer: Self-pay | Admitting: Cardiology

## 2019-12-26 DIAGNOSIS — J449 Chronic obstructive pulmonary disease, unspecified: Secondary | ICD-10-CM | POA: Diagnosis not present

## 2019-12-26 DIAGNOSIS — I110 Hypertensive heart disease with heart failure: Secondary | ICD-10-CM | POA: Diagnosis not present

## 2019-12-26 DIAGNOSIS — M1991 Primary osteoarthritis, unspecified site: Secondary | ICD-10-CM | POA: Diagnosis not present

## 2019-12-26 DIAGNOSIS — I480 Paroxysmal atrial fibrillation: Secondary | ICD-10-CM | POA: Diagnosis not present

## 2019-12-26 DIAGNOSIS — M5030 Other cervical disc degeneration, unspecified cervical region: Secondary | ICD-10-CM | POA: Diagnosis not present

## 2019-12-26 DIAGNOSIS — I5032 Chronic diastolic (congestive) heart failure: Secondary | ICD-10-CM | POA: Diagnosis not present

## 2019-12-26 NOTE — Telephone Encounter (Signed)
[facility] with [facility], verbal order given.

## 2019-12-26 NOTE — Telephone Encounter (Signed)
New message   Per [facility] need a verbal order for physical. Please call to discuss.

## 2019-12-28 DIAGNOSIS — J449 Chronic obstructive pulmonary disease, unspecified: Secondary | ICD-10-CM | POA: Diagnosis not present

## 2019-12-28 DIAGNOSIS — M1991 Primary osteoarthritis, unspecified site: Secondary | ICD-10-CM | POA: Diagnosis not present

## 2019-12-28 DIAGNOSIS — M5030 Other cervical disc degeneration, unspecified cervical region: Secondary | ICD-10-CM | POA: Diagnosis not present

## 2019-12-28 DIAGNOSIS — I480 Paroxysmal atrial fibrillation: Secondary | ICD-10-CM | POA: Diagnosis not present

## 2019-12-28 DIAGNOSIS — I5032 Chronic diastolic (congestive) heart failure: Secondary | ICD-10-CM | POA: Diagnosis not present

## 2019-12-28 DIAGNOSIS — I110 Hypertensive heart disease with heart failure: Secondary | ICD-10-CM | POA: Diagnosis not present

## 2019-12-31 DIAGNOSIS — M5030 Other cervical disc degeneration, unspecified cervical region: Secondary | ICD-10-CM | POA: Diagnosis not present

## 2019-12-31 DIAGNOSIS — M1991 Primary osteoarthritis, unspecified site: Secondary | ICD-10-CM | POA: Diagnosis not present

## 2019-12-31 DIAGNOSIS — I110 Hypertensive heart disease with heart failure: Secondary | ICD-10-CM | POA: Diagnosis not present

## 2019-12-31 DIAGNOSIS — J449 Chronic obstructive pulmonary disease, unspecified: Secondary | ICD-10-CM | POA: Diagnosis not present

## 2019-12-31 DIAGNOSIS — I480 Paroxysmal atrial fibrillation: Secondary | ICD-10-CM | POA: Diagnosis not present

## 2019-12-31 DIAGNOSIS — I5032 Chronic diastolic (congestive) heart failure: Secondary | ICD-10-CM | POA: Diagnosis not present

## 2020-01-01 DIAGNOSIS — M1991 Primary osteoarthritis, unspecified site: Secondary | ICD-10-CM | POA: Diagnosis not present

## 2020-01-01 DIAGNOSIS — I480 Paroxysmal atrial fibrillation: Secondary | ICD-10-CM | POA: Diagnosis not present

## 2020-01-01 DIAGNOSIS — I5032 Chronic diastolic (congestive) heart failure: Secondary | ICD-10-CM | POA: Diagnosis not present

## 2020-01-01 DIAGNOSIS — M5030 Other cervical disc degeneration, unspecified cervical region: Secondary | ICD-10-CM | POA: Diagnosis not present

## 2020-01-01 DIAGNOSIS — I110 Hypertensive heart disease with heart failure: Secondary | ICD-10-CM | POA: Diagnosis not present

## 2020-01-01 DIAGNOSIS — J449 Chronic obstructive pulmonary disease, unspecified: Secondary | ICD-10-CM | POA: Diagnosis not present

## 2020-01-02 DIAGNOSIS — M5030 Other cervical disc degeneration, unspecified cervical region: Secondary | ICD-10-CM | POA: Diagnosis not present

## 2020-01-02 DIAGNOSIS — M1991 Primary osteoarthritis, unspecified site: Secondary | ICD-10-CM | POA: Diagnosis not present

## 2020-01-02 DIAGNOSIS — J449 Chronic obstructive pulmonary disease, unspecified: Secondary | ICD-10-CM | POA: Diagnosis not present

## 2020-01-02 DIAGNOSIS — I480 Paroxysmal atrial fibrillation: Secondary | ICD-10-CM | POA: Diagnosis not present

## 2020-01-02 DIAGNOSIS — I5032 Chronic diastolic (congestive) heart failure: Secondary | ICD-10-CM | POA: Diagnosis not present

## 2020-01-02 DIAGNOSIS — I110 Hypertensive heart disease with heart failure: Secondary | ICD-10-CM | POA: Diagnosis not present

## 2020-01-04 DIAGNOSIS — M1991 Primary osteoarthritis, unspecified site: Secondary | ICD-10-CM | POA: Diagnosis not present

## 2020-01-04 DIAGNOSIS — I480 Paroxysmal atrial fibrillation: Secondary | ICD-10-CM | POA: Diagnosis not present

## 2020-01-04 DIAGNOSIS — M5030 Other cervical disc degeneration, unspecified cervical region: Secondary | ICD-10-CM | POA: Diagnosis not present

## 2020-01-04 DIAGNOSIS — I110 Hypertensive heart disease with heart failure: Secondary | ICD-10-CM | POA: Diagnosis not present

## 2020-01-04 DIAGNOSIS — I5032 Chronic diastolic (congestive) heart failure: Secondary | ICD-10-CM | POA: Diagnosis not present

## 2020-01-04 DIAGNOSIS — J449 Chronic obstructive pulmonary disease, unspecified: Secondary | ICD-10-CM | POA: Diagnosis not present

## 2020-01-07 DIAGNOSIS — I110 Hypertensive heart disease with heart failure: Secondary | ICD-10-CM | POA: Diagnosis not present

## 2020-01-07 DIAGNOSIS — M5030 Other cervical disc degeneration, unspecified cervical region: Secondary | ICD-10-CM | POA: Diagnosis not present

## 2020-01-07 DIAGNOSIS — I5032 Chronic diastolic (congestive) heart failure: Secondary | ICD-10-CM | POA: Diagnosis not present

## 2020-01-07 DIAGNOSIS — J449 Chronic obstructive pulmonary disease, unspecified: Secondary | ICD-10-CM | POA: Diagnosis not present

## 2020-01-07 DIAGNOSIS — I480 Paroxysmal atrial fibrillation: Secondary | ICD-10-CM | POA: Diagnosis not present

## 2020-01-07 DIAGNOSIS — M1991 Primary osteoarthritis, unspecified site: Secondary | ICD-10-CM | POA: Diagnosis not present

## 2020-01-09 DIAGNOSIS — I480 Paroxysmal atrial fibrillation: Secondary | ICD-10-CM | POA: Diagnosis not present

## 2020-01-09 DIAGNOSIS — I5032 Chronic diastolic (congestive) heart failure: Secondary | ICD-10-CM | POA: Diagnosis not present

## 2020-01-09 DIAGNOSIS — J449 Chronic obstructive pulmonary disease, unspecified: Secondary | ICD-10-CM | POA: Diagnosis not present

## 2020-01-09 DIAGNOSIS — M1991 Primary osteoarthritis, unspecified site: Secondary | ICD-10-CM | POA: Diagnosis not present

## 2020-01-09 DIAGNOSIS — M5030 Other cervical disc degeneration, unspecified cervical region: Secondary | ICD-10-CM | POA: Diagnosis not present

## 2020-01-09 DIAGNOSIS — I110 Hypertensive heart disease with heart failure: Secondary | ICD-10-CM | POA: Diagnosis not present

## 2020-01-10 DIAGNOSIS — Z0000 Encounter for general adult medical examination without abnormal findings: Secondary | ICD-10-CM | POA: Diagnosis not present

## 2020-01-10 DIAGNOSIS — K219 Gastro-esophageal reflux disease without esophagitis: Secondary | ICD-10-CM | POA: Diagnosis not present

## 2020-01-10 DIAGNOSIS — M150 Primary generalized (osteo)arthritis: Secondary | ICD-10-CM | POA: Diagnosis not present

## 2020-01-10 DIAGNOSIS — J309 Allergic rhinitis, unspecified: Secondary | ICD-10-CM | POA: Diagnosis not present

## 2020-01-10 DIAGNOSIS — E039 Hypothyroidism, unspecified: Secondary | ICD-10-CM | POA: Diagnosis not present

## 2020-01-10 DIAGNOSIS — M8580 Other specified disorders of bone density and structure, unspecified site: Secondary | ICD-10-CM | POA: Diagnosis not present

## 2020-01-10 DIAGNOSIS — F322 Major depressive disorder, single episode, severe without psychotic features: Secondary | ICD-10-CM | POA: Diagnosis not present

## 2020-01-10 DIAGNOSIS — I5030 Unspecified diastolic (congestive) heart failure: Secondary | ICD-10-CM | POA: Diagnosis not present

## 2020-01-10 DIAGNOSIS — I4891 Unspecified atrial fibrillation: Secondary | ICD-10-CM | POA: Diagnosis not present

## 2020-01-10 DIAGNOSIS — N3281 Overactive bladder: Secondary | ICD-10-CM | POA: Diagnosis not present

## 2020-01-10 DIAGNOSIS — I350 Nonrheumatic aortic (valve) stenosis: Secondary | ICD-10-CM | POA: Diagnosis not present

## 2020-01-10 DIAGNOSIS — I110 Hypertensive heart disease with heart failure: Secondary | ICD-10-CM | POA: Diagnosis not present

## 2020-01-14 DIAGNOSIS — I5032 Chronic diastolic (congestive) heart failure: Secondary | ICD-10-CM | POA: Diagnosis not present

## 2020-01-14 DIAGNOSIS — I480 Paroxysmal atrial fibrillation: Secondary | ICD-10-CM | POA: Diagnosis not present

## 2020-01-14 DIAGNOSIS — M5030 Other cervical disc degeneration, unspecified cervical region: Secondary | ICD-10-CM | POA: Diagnosis not present

## 2020-01-14 DIAGNOSIS — I110 Hypertensive heart disease with heart failure: Secondary | ICD-10-CM | POA: Diagnosis not present

## 2020-01-14 DIAGNOSIS — J449 Chronic obstructive pulmonary disease, unspecified: Secondary | ICD-10-CM | POA: Diagnosis not present

## 2020-01-14 DIAGNOSIS — M1991 Primary osteoarthritis, unspecified site: Secondary | ICD-10-CM | POA: Diagnosis not present

## 2020-01-16 DIAGNOSIS — I5032 Chronic diastolic (congestive) heart failure: Secondary | ICD-10-CM | POA: Diagnosis not present

## 2020-01-16 DIAGNOSIS — J449 Chronic obstructive pulmonary disease, unspecified: Secondary | ICD-10-CM | POA: Diagnosis not present

## 2020-01-16 DIAGNOSIS — M1991 Primary osteoarthritis, unspecified site: Secondary | ICD-10-CM | POA: Diagnosis not present

## 2020-01-16 DIAGNOSIS — R300 Dysuria: Secondary | ICD-10-CM | POA: Diagnosis not present

## 2020-01-16 DIAGNOSIS — M5030 Other cervical disc degeneration, unspecified cervical region: Secondary | ICD-10-CM | POA: Diagnosis not present

## 2020-01-16 DIAGNOSIS — I480 Paroxysmal atrial fibrillation: Secondary | ICD-10-CM | POA: Diagnosis not present

## 2020-01-16 DIAGNOSIS — I110 Hypertensive heart disease with heart failure: Secondary | ICD-10-CM | POA: Diagnosis not present

## 2020-01-20 DIAGNOSIS — Z9181 History of falling: Secondary | ICD-10-CM | POA: Diagnosis not present

## 2020-01-20 DIAGNOSIS — Z8554 Personal history of malignant neoplasm of ureter: Secondary | ICD-10-CM | POA: Diagnosis not present

## 2020-01-20 DIAGNOSIS — M5030 Other cervical disc degeneration, unspecified cervical region: Secondary | ICD-10-CM | POA: Diagnosis not present

## 2020-01-20 DIAGNOSIS — E039 Hypothyroidism, unspecified: Secondary | ICD-10-CM | POA: Diagnosis not present

## 2020-01-20 DIAGNOSIS — R351 Nocturia: Secondary | ICD-10-CM | POA: Diagnosis not present

## 2020-01-20 DIAGNOSIS — Z7901 Long term (current) use of anticoagulants: Secondary | ICD-10-CM | POA: Diagnosis not present

## 2020-01-20 DIAGNOSIS — K5790 Diverticulosis of intestine, part unspecified, without perforation or abscess without bleeding: Secondary | ICD-10-CM | POA: Diagnosis not present

## 2020-01-20 DIAGNOSIS — Z9981 Dependence on supplemental oxygen: Secondary | ICD-10-CM | POA: Diagnosis not present

## 2020-01-20 DIAGNOSIS — Z8673 Personal history of transient ischemic attack (TIA), and cerebral infarction without residual deficits: Secondary | ICD-10-CM | POA: Diagnosis not present

## 2020-01-20 DIAGNOSIS — I5032 Chronic diastolic (congestive) heart failure: Secondary | ICD-10-CM | POA: Diagnosis not present

## 2020-01-20 DIAGNOSIS — I110 Hypertensive heart disease with heart failure: Secondary | ICD-10-CM | POA: Diagnosis not present

## 2020-01-20 DIAGNOSIS — M1991 Primary osteoarthritis, unspecified site: Secondary | ICD-10-CM | POA: Diagnosis not present

## 2020-01-20 DIAGNOSIS — Z86010 Personal history of colonic polyps: Secondary | ICD-10-CM | POA: Diagnosis not present

## 2020-01-20 DIAGNOSIS — M8580 Other specified disorders of bone density and structure, unspecified site: Secondary | ICD-10-CM | POA: Diagnosis not present

## 2020-01-20 DIAGNOSIS — J449 Chronic obstructive pulmonary disease, unspecified: Secondary | ICD-10-CM | POA: Diagnosis not present

## 2020-01-20 DIAGNOSIS — Z8551 Personal history of malignant neoplasm of bladder: Secondary | ICD-10-CM | POA: Diagnosis not present

## 2020-01-20 DIAGNOSIS — Z87891 Personal history of nicotine dependence: Secondary | ICD-10-CM | POA: Diagnosis not present

## 2020-01-20 DIAGNOSIS — I480 Paroxysmal atrial fibrillation: Secondary | ICD-10-CM | POA: Diagnosis not present

## 2020-01-21 DIAGNOSIS — I110 Hypertensive heart disease with heart failure: Secondary | ICD-10-CM | POA: Diagnosis not present

## 2020-01-21 DIAGNOSIS — M5030 Other cervical disc degeneration, unspecified cervical region: Secondary | ICD-10-CM | POA: Diagnosis not present

## 2020-01-21 DIAGNOSIS — I5032 Chronic diastolic (congestive) heart failure: Secondary | ICD-10-CM | POA: Diagnosis not present

## 2020-01-21 DIAGNOSIS — M1991 Primary osteoarthritis, unspecified site: Secondary | ICD-10-CM | POA: Diagnosis not present

## 2020-01-21 DIAGNOSIS — I480 Paroxysmal atrial fibrillation: Secondary | ICD-10-CM | POA: Diagnosis not present

## 2020-01-21 DIAGNOSIS — J449 Chronic obstructive pulmonary disease, unspecified: Secondary | ICD-10-CM | POA: Diagnosis not present

## 2020-01-23 DIAGNOSIS — M5030 Other cervical disc degeneration, unspecified cervical region: Secondary | ICD-10-CM | POA: Diagnosis not present

## 2020-01-23 DIAGNOSIS — J449 Chronic obstructive pulmonary disease, unspecified: Secondary | ICD-10-CM | POA: Diagnosis not present

## 2020-01-23 DIAGNOSIS — I110 Hypertensive heart disease with heart failure: Secondary | ICD-10-CM | POA: Diagnosis not present

## 2020-01-23 DIAGNOSIS — I480 Paroxysmal atrial fibrillation: Secondary | ICD-10-CM | POA: Diagnosis not present

## 2020-01-23 DIAGNOSIS — I5032 Chronic diastolic (congestive) heart failure: Secondary | ICD-10-CM | POA: Diagnosis not present

## 2020-01-23 DIAGNOSIS — M1991 Primary osteoarthritis, unspecified site: Secondary | ICD-10-CM | POA: Diagnosis not present

## 2020-01-28 DIAGNOSIS — J449 Chronic obstructive pulmonary disease, unspecified: Secondary | ICD-10-CM | POA: Diagnosis not present

## 2020-01-28 DIAGNOSIS — M1991 Primary osteoarthritis, unspecified site: Secondary | ICD-10-CM | POA: Diagnosis not present

## 2020-01-28 DIAGNOSIS — I480 Paroxysmal atrial fibrillation: Secondary | ICD-10-CM | POA: Diagnosis not present

## 2020-01-28 DIAGNOSIS — I110 Hypertensive heart disease with heart failure: Secondary | ICD-10-CM | POA: Diagnosis not present

## 2020-01-28 DIAGNOSIS — I5032 Chronic diastolic (congestive) heart failure: Secondary | ICD-10-CM | POA: Diagnosis not present

## 2020-01-28 DIAGNOSIS — M5030 Other cervical disc degeneration, unspecified cervical region: Secondary | ICD-10-CM | POA: Diagnosis not present

## 2020-02-04 DIAGNOSIS — I110 Hypertensive heart disease with heart failure: Secondary | ICD-10-CM | POA: Diagnosis not present

## 2020-02-04 DIAGNOSIS — I5032 Chronic diastolic (congestive) heart failure: Secondary | ICD-10-CM | POA: Diagnosis not present

## 2020-02-04 DIAGNOSIS — I480 Paroxysmal atrial fibrillation: Secondary | ICD-10-CM | POA: Diagnosis not present

## 2020-02-04 DIAGNOSIS — J449 Chronic obstructive pulmonary disease, unspecified: Secondary | ICD-10-CM | POA: Diagnosis not present

## 2020-02-04 DIAGNOSIS — M5030 Other cervical disc degeneration, unspecified cervical region: Secondary | ICD-10-CM | POA: Diagnosis not present

## 2020-02-04 DIAGNOSIS — M1991 Primary osteoarthritis, unspecified site: Secondary | ICD-10-CM | POA: Diagnosis not present

## 2020-02-06 DIAGNOSIS — M1991 Primary osteoarthritis, unspecified site: Secondary | ICD-10-CM | POA: Diagnosis not present

## 2020-02-06 DIAGNOSIS — I5032 Chronic diastolic (congestive) heart failure: Secondary | ICD-10-CM | POA: Diagnosis not present

## 2020-02-06 DIAGNOSIS — J449 Chronic obstructive pulmonary disease, unspecified: Secondary | ICD-10-CM | POA: Diagnosis not present

## 2020-02-06 DIAGNOSIS — I110 Hypertensive heart disease with heart failure: Secondary | ICD-10-CM | POA: Diagnosis not present

## 2020-02-06 DIAGNOSIS — M5030 Other cervical disc degeneration, unspecified cervical region: Secondary | ICD-10-CM | POA: Diagnosis not present

## 2020-02-06 DIAGNOSIS — I480 Paroxysmal atrial fibrillation: Secondary | ICD-10-CM | POA: Diagnosis not present

## 2020-02-11 ENCOUNTER — Other Ambulatory Visit: Payer: Self-pay

## 2020-02-11 ENCOUNTER — Ambulatory Visit (INDEPENDENT_AMBULATORY_CARE_PROVIDER_SITE_OTHER): Payer: Medicare Other | Admitting: Podiatry

## 2020-02-11 ENCOUNTER — Encounter: Payer: Self-pay | Admitting: Podiatry

## 2020-02-11 DIAGNOSIS — Q828 Other specified congenital malformations of skin: Secondary | ICD-10-CM | POA: Diagnosis not present

## 2020-02-11 DIAGNOSIS — M79675 Pain in left toe(s): Secondary | ICD-10-CM | POA: Diagnosis not present

## 2020-02-11 DIAGNOSIS — M79674 Pain in right toe(s): Secondary | ICD-10-CM | POA: Diagnosis not present

## 2020-02-11 DIAGNOSIS — D689 Coagulation defect, unspecified: Secondary | ICD-10-CM | POA: Diagnosis not present

## 2020-02-11 DIAGNOSIS — B351 Tinea unguium: Secondary | ICD-10-CM | POA: Diagnosis not present

## 2020-02-11 NOTE — Progress Notes (Signed)
This patient returns to my office for at risk foot care.  This patient requires this care by a professional since this patient will be at risk due to having coagulation disorder.  Patient is taking eliquis.This patient presents to the office in a wheelchair with her daughter This patient is unable to cut nails himself since the patient cannot reach his nails.These nails are painful walking and wearing shoes.  This patient presents for at risk foot care today.  General Appearance  Alert, conversant and in no acute stress.  Vascular  Dorsalis pedis   pulses are palpable  bilaterally. Posterior tibial pulses are not palpable  B/L. Capillary return is within normal limits  bilaterally. Temperature is within normal limits  bilaterally.  Neurologic  Senn-Weinstein monofilament wire test within normal limits  bilaterally. Muscle power within normal limits bilaterally.  Nails Thick disfigured discolored nails with subungual debris  from hallux to fifth toes bilaterally. No evidence of bacterial infection or drainage bilaterally.  Orthopedic  No limitations of motion  feet .  No crepitus or effusions noted.  HAV with overlapping second toe  B/L.  Plantar flexed met second left foot.    Skin  normotropic skin with no porokeratosis noted bilaterally.  No signs of infections or ulcers noted.     Onychomycosis  Pain in right toes  Pain in left toes  Consent was obtained for treatment procedures.   Mechanical debridement of nails 1-5  bilaterally performed with a nail nipper.  Filed with dremel without incident. Debridement of porokeratosis with # 15 blade.   Return office visit   3 months                   Told patient to return for periodic foot care and evaluation due to potential at risk complications.   Gardiner Barefoot DPM

## 2020-02-13 DIAGNOSIS — M1991 Primary osteoarthritis, unspecified site: Secondary | ICD-10-CM | POA: Diagnosis not present

## 2020-02-13 DIAGNOSIS — J449 Chronic obstructive pulmonary disease, unspecified: Secondary | ICD-10-CM | POA: Diagnosis not present

## 2020-02-13 DIAGNOSIS — M5030 Other cervical disc degeneration, unspecified cervical region: Secondary | ICD-10-CM | POA: Diagnosis not present

## 2020-02-13 DIAGNOSIS — I480 Paroxysmal atrial fibrillation: Secondary | ICD-10-CM | POA: Diagnosis not present

## 2020-02-13 DIAGNOSIS — I5032 Chronic diastolic (congestive) heart failure: Secondary | ICD-10-CM | POA: Diagnosis not present

## 2020-02-13 DIAGNOSIS — I110 Hypertensive heart disease with heart failure: Secondary | ICD-10-CM | POA: Diagnosis not present

## 2020-02-14 ENCOUNTER — Telehealth: Payer: Self-pay | Admitting: Cardiology

## 2020-02-14 DIAGNOSIS — I110 Hypertensive heart disease with heart failure: Secondary | ICD-10-CM | POA: Diagnosis not present

## 2020-02-14 DIAGNOSIS — I5032 Chronic diastolic (congestive) heart failure: Secondary | ICD-10-CM | POA: Diagnosis not present

## 2020-02-14 DIAGNOSIS — J449 Chronic obstructive pulmonary disease, unspecified: Secondary | ICD-10-CM | POA: Diagnosis not present

## 2020-02-14 DIAGNOSIS — I480 Paroxysmal atrial fibrillation: Secondary | ICD-10-CM | POA: Diagnosis not present

## 2020-02-14 DIAGNOSIS — M1991 Primary osteoarthritis, unspecified site: Secondary | ICD-10-CM | POA: Diagnosis not present

## 2020-02-14 DIAGNOSIS — M5030 Other cervical disc degeneration, unspecified cervical region: Secondary | ICD-10-CM | POA: Diagnosis not present

## 2020-02-14 NOTE — Telephone Encounter (Signed)
Left message for Rebecca Norman, verbal order given.

## 2020-02-14 NOTE — Telephone Encounter (Signed)
New Message:     Rebecca Norman needs a verbal order for PT I time a week for 4 weeks.

## 2020-02-18 DIAGNOSIS — M1991 Primary osteoarthritis, unspecified site: Secondary | ICD-10-CM | POA: Diagnosis not present

## 2020-02-18 DIAGNOSIS — J449 Chronic obstructive pulmonary disease, unspecified: Secondary | ICD-10-CM | POA: Diagnosis not present

## 2020-02-18 DIAGNOSIS — I5032 Chronic diastolic (congestive) heart failure: Secondary | ICD-10-CM | POA: Diagnosis not present

## 2020-02-18 DIAGNOSIS — I110 Hypertensive heart disease with heart failure: Secondary | ICD-10-CM | POA: Diagnosis not present

## 2020-02-18 DIAGNOSIS — M5030 Other cervical disc degeneration, unspecified cervical region: Secondary | ICD-10-CM | POA: Diagnosis not present

## 2020-02-18 DIAGNOSIS — I480 Paroxysmal atrial fibrillation: Secondary | ICD-10-CM | POA: Diagnosis not present

## 2020-02-19 DIAGNOSIS — I110 Hypertensive heart disease with heart failure: Secondary | ICD-10-CM | POA: Diagnosis not present

## 2020-03-16 ENCOUNTER — Emergency Department (HOSPITAL_COMMUNITY): Payer: Medicare Other

## 2020-03-16 ENCOUNTER — Encounter (HOSPITAL_COMMUNITY): Payer: Self-pay

## 2020-03-16 ENCOUNTER — Other Ambulatory Visit: Payer: Self-pay

## 2020-03-16 ENCOUNTER — Emergency Department (HOSPITAL_COMMUNITY)
Admission: EM | Admit: 2020-03-16 | Discharge: 2020-03-17 | Disposition: A | Discharge status: Home / Self Care | Payer: Medicare Other | Attending: Emergency Medicine | Admitting: Emergency Medicine

## 2020-03-16 DIAGNOSIS — R2242 Localized swelling, mass and lump, left lower limb: Secondary | ICD-10-CM | POA: Diagnosis not present

## 2020-03-16 DIAGNOSIS — N3001 Acute cystitis with hematuria: Secondary | ICD-10-CM | POA: Diagnosis not present

## 2020-03-16 DIAGNOSIS — Z20822 Contact with and (suspected) exposure to covid-19: Secondary | ICD-10-CM | POA: Insufficient documentation

## 2020-03-16 DIAGNOSIS — Z87891 Personal history of nicotine dependence: Secondary | ICD-10-CM | POA: Diagnosis not present

## 2020-03-16 DIAGNOSIS — I4891 Unspecified atrial fibrillation: Secondary | ICD-10-CM | POA: Insufficient documentation

## 2020-03-16 DIAGNOSIS — R509 Fever, unspecified: Secondary | ICD-10-CM | POA: Insufficient documentation

## 2020-03-16 DIAGNOSIS — Z79899 Other long term (current) drug therapy: Secondary | ICD-10-CM | POA: Diagnosis not present

## 2020-03-16 DIAGNOSIS — R531 Weakness: Secondary | ICD-10-CM | POA: Diagnosis not present

## 2020-03-16 DIAGNOSIS — Z7901 Long term (current) use of anticoagulants: Secondary | ICD-10-CM | POA: Diagnosis not present

## 2020-03-16 DIAGNOSIS — I110 Hypertensive heart disease with heart failure: Secondary | ICD-10-CM | POA: Diagnosis not present

## 2020-03-16 DIAGNOSIS — R9431 Abnormal electrocardiogram [ECG] [EKG]: Secondary | ICD-10-CM | POA: Diagnosis not present

## 2020-03-16 DIAGNOSIS — I5032 Chronic diastolic (congestive) heart failure: Secondary | ICD-10-CM | POA: Diagnosis not present

## 2020-03-16 NOTE — ED Triage Notes (Signed)
Pt from home via EMS for eval of weakness/"slight edema" in BLE. Per EMS, pt had fever of 101.7 on their arrival, 1000mg  tylenol given 2210, temp 99.3 on arrival. Pt on 5L [place], 4-5L/min home O2 use. Pt reports being taken off potassium supplement "last week," and being recently diagnosed with hyperthyroidism. No additional complaints at this time, EMS VSS.

## 2020-03-16 NOTE — ED Provider Notes (Signed)
[facility] Provider Note   CSN: [id number] Arrival date & time: 03/16/20  2221     History Chief Complaint  Patient presents with  . Weakness    [facility] is a 86 y.o. female.  The history is provided by the patient and medical records.   86 y.o. F with hx of DDD, HTN, hypothyroidism, osteopenia, symptomatic bradycardia, b12 deficiency, CHF, PAT on eliquis, presenting to the ED for generalized weakness and fever.  Patient state she just started feeling poorly over the past 2 days.  States her energy is low and she is tired.  Daughter apparently noticed some swelling of LE, patient denies any issues with this.  No noted leg pain or difficulty walking.  Patient states fever began today-- did not know she had a fever until EMS picked her up.  She states very mild cough, more so just to clear her throat.  She denies vomiting or diarrhea.  She has been eating/drinking well.  She had BM earlier today.  Past Medical History:  Diagnosis Date  . DDD (degenerative disc disease), cervical    severe/  auto fusion c2-c5  . Diverticulosis   . Hemorrhoids   . History of adenomatous polyp of colon    1995  adenomatous polyp/  1997 & 2003  hyperplastic polyp's  . History of bladder cancer    s/p  TURBT's, urologist-  Dr [name]  . History of cancer of ureter   . Hypertension   . Hypothyroid   . Nocturia   . Osteoarthritis   . Osteopenia   . Symptomatic bradycardia 05/06/2019    Patient Active Problem List   Diagnosis Date Noted  . Porokeratosis 11/06/2019  . History of embolic stroke 83/66/2947  . Dizziness 05/08/2019  . Falls 05/08/2019  . Hypokalemia 05/08/2019  . Aortic valve sclerosis 05/08/2019  . PAT (paroxysmal atrial tachycardia) ([place]) 05/08/2019  . Blocked premature atrial contraction 05/08/2019  . Bradycardia 05/06/2019  . Coagulation disorder ([place]) 04/10/2019  . Acute on chronic respiratory failure with hypoxia ([place]) 03/25/2018    . Leukocytosis 03/25/2018  . B12 deficiency 02/18/2018  . Acute lower UTI 02/07/2018  . Paroxysmal atrial fibrillation ([place]) 11/21/2017  . Cardiorenal syndrome with renal failure 11/15/2017  . Anemia, unspecified 11/12/2017  . Chronic diastolic CHF (congestive heart failure) ([place]) 08/25/2017  . Current use of long term anticoagulation 08/11/2017  . Hypothyroid 06/13/2017  . Palpitations 06/13/2017  . Heart murmur 06/13/2017  . S/P laminectomy 07/10/2013  . Essential hypertension 07/10/2013  . BLADDER CANCER 12/17/2008  . DIVERTICULOSIS OF COLON 12/12/2008  . COLONIC POLYPS, HYPERPLASTIC, HX OF 12/12/2008    Past Surgical History:  Procedure Laterality Date  . BACK SURGERY    . CARDIOVERSION N/A 08/09/2017   Procedure: CARDIOVERSION;  Surgeon: Larey Dresser, MD;  Location: [facility];  Service: Cardiovascular;  Laterality: N/A;  . CATARACT EXTRACTION W/ INTRAOCULAR LENS  IMPLANT, BILATERAL Bilateral   . CYSTOSCOPY W/ RETROGRADES  10/03/2011   Procedure: CYSTOSCOPY WITH RETROGRADE PYELOGRAM;  Surgeon: Molli Hazard, MD;  Location: [facility];  Service: Urology;;  . Consuela Mimes W/ RETROGRADES Bilateral 06/17/2016   Procedure: CYSTOSCOPY WITH RETROGRADE PYELOGRAM;  Surgeon: Alexis Frock, MD;  Location: [facility];  Service: Urology;  Laterality: Bilateral;  . CYSTOSCOPY WITH BIOPSY N/A 06/17/2016   Procedure: CYSTOSCOPY WITH BIOPSY AND FULGERATION;  Surgeon: Alexis Frock, MD;  Location: [facility];  Service: Urology;  Laterality: N/A;  .  LAMINECTOMY  2009   L3 - 4  . ORIF ANKLE FRACTURE Left 08/28/2017   Procedure: OPEN REDUCTION INTERNAL FIXATION (ORIF) ANKLE FRACTURE;  Surgeon: Shona Needles, MD;  Location: [facility];  Service: Orthopedics;  Laterality: Left;  . TRANSURETHRAL RESECTION OF BLADDER  x3 --  2009; 2010; 06-29-2010  . TRANSURETHRAL RESECTION OF BLADDER TUMOR  10/03/2011   Procedure: TRANSURETHRAL RESECTION OF  BLADDER TUMOR (TURBT);  Surgeon: Molli Hazard, MD;  Location: [facility];  Service: Urology;  Laterality: N/A;     OB History   No obstetric history on file.     Family History  Problem Relation Age of Onset  . Breast cancer Sister   . Stomach cancer Sister   . Hernia Sister 65       HERNIA SURGERY COMPLICATIONS  . Colon cancer Neg Hx     Social History   Tobacco Use  . Smoking status: Former Smoker    Years: 10.00    Types: Cigarettes    Quit date: 09/25/2005    Years since quitting: 14.4  . Smokeless tobacco: Never Used  Vaping Use  . Vaping Use: Never used  Substance Use Topics  . Alcohol use: Yes  . Drug use: No    Home Medications Prior to Admission medications   Medication Sig Start Date End Date Taking? Authorizing Provider  acetaminophen (TYLENOL) 500 MG tablet Take 500 mg by mouth every 6 (six) hours as needed for headache (pain).     [provider]  ciprofloxacin (CIPRO) 250 MG tablet SMARTSIG:1 Tablet(s) By Mouth Every 12 Hours 01/16/20   [provider]  ELIQUIS 5 MG TABS tablet TAKE 1 TABLET TWICE A DAY 11/06/18   Lelon Perla, MD  furosemide (LASIX) 20 MG tablet Take 1 tablet (20 mg total) by mouth daily. 08/09/19   Lendon Colonel, NP  levothyroxine (SYNTHROID, LEVOTHROID) 50 MCG tablet Take 50-75 mcg by mouth See admin instructions. Take every other alternating between 60mcg and 64mcg.    [provider]  oxybutynin (DITROPAN) 5 MG tablet Take 5 mg by mouth at bedtime as needed. 06/26/19   [provider]  OXYGEN Inhale 4.5 L into the lungs continuous.     [provider]  sertraline (ZOLOFT) 50 MG tablet Take 50 mg by mouth at bedtime.  09/22/18   [provider]    Allergies    Flagyl [metronidazole] and Lisinopril  Review of Systems   Review of Systems  Constitutional: Positive for fever.  All other systems reviewed and are negative.   Physical Exam Updated  Vital Signs BP (!) 111/54 (BP Location: Right Arm)   Pulse 77   Temp 99.3 F (37.4 C) (Oral)   Resp 16   Ht 5\' 4"  (1.626 m)   Wt 73.9 kg   SpO2 97%   BMI 27.98 kg/m   Physical Exam Vitals and nursing note reviewed.  Constitutional:      Appearance: She is well-developed.     Comments: Elderly, NAD  HENT:     Head: Normocephalic and atraumatic.  Eyes:     Conjunctiva/sclera: Conjunctivae normal.     Pupils: Pupils are equal, round, and reactive to light.  Cardiovascular:     Rate and Rhythm: Normal rate and regular rhythm.     Heart sounds: Normal heart sounds.  Pulmonary:     Effort: Pulmonary effort is normal.     Breath sounds: Normal breath sounds. No stridor. No wheezing  or rhonchi.     Comments: Very minor cough noted, on 4L [place] (baseline), no labored breathing, no signs of distress Abdominal:     General: Bowel sounds are normal.     Palpations: Abdomen is soft.     Tenderness: There is no abdominal tenderness. There is no rebound.  Musculoskeletal:        General: Normal range of motion.     Cervical back: Normal range of motion.     Comments: No significant edema of BLE; no calf asymmetry, tenderness, palpable cords, or overlying skin changes noted, DP pulses intact bilaterally  Skin:    General: Skin is warm and dry.  Neurological:     Mental Status: She is alert and oriented to person, place, and time.     ED Results / Procedures / Treatments   Labs (all labs ordered are listed, but only abnormal results are displayed) Labs Reviewed  CBC WITH DIFFERENTIAL/PLATELET - Abnormal; Notable for the following components:      Result Value   Hemoglobin 10.8 (*)    HCT 34.5 (*)    All other components within normal limits  COMPREHENSIVE METABOLIC PANEL - Abnormal; Notable for the following components:   Sodium 134 (*)    Glucose, Bld 109 (*)    All other components within normal limits  BRAIN NATRIURETIC PEPTIDE - Abnormal; Notable for the following components:    B Natriuretic Peptide 662.9 (*)    All other components within normal limits  URINALYSIS, ROUTINE W REFLEX MICROSCOPIC - Abnormal; Notable for the following components:   Color, Urine STRAW (*)    Hgb urine dipstick MODERATE (*)    Leukocytes,Ua LARGE (*)    WBC, UA >50 (*)    Bacteria, UA FEW (*)    All other components within normal limits  TROPONIN I (HIGH SENSITIVITY) - Abnormal; Notable for the following components:   Troponin I (High Sensitivity) 21 (*)    All other components within normal limits  SARS CORONAVIRUS 2 BY RT PCR ([facility] ORDER, [place] [facility])  CULTURE, BLOOD (ROUTINE X 2)  CULTURE, BLOOD (ROUTINE X 2)  URINE CULTURE  LACTIC ACID, PLASMA    EKG EKG Interpretation  Date/Time:  Monday March 16 2020 22:38:39 EDT Ventricular Rate:  78 PR Interval:    QRS Duration: 100 QT Interval:  399 QTC Calculation: 455 R Axis:   6 Text Interpretation: Sinus rhythm Atrial premature complex Abnormal R-wave progression, early transition LVH with secondary repolarization abnormality Minimal ST elevation, inferior leads No significant change since last tracing Confirmed by Pryor Curia [phone]) on 03/17/2020 2:46:45 AM   Radiology DG Chest Port 1 View  Result Date: 03/16/2020 CLINICAL DATA:  Fever, lower extremity edema, weakness EXAM: PORTABLE CHEST 1 VIEW COMPARISON:  05/06/2019 FINDINGS: Lungs are clear. No pneumothorax or pleural effusion. Cardiac size is mildly enlarged, unchanged. The thoracic aorta is tortuous, likely extension weighted by rotation on this examination. Interstitial thickening has slightly improved on this examination and the pulmonary vascularity is normal. IMPRESSION: No active disease. Electronically Signed   By: Fidela Salisbury MD   On: 03/16/2020 23:06    Procedures Procedures (including critical care time)  Medications Ordered in ED Medications  cefTRIAXone (ROCEPHIN) 1 g in sodium chloride 0.9 % 100 mL IVPB (0 g  Intravenous Stopped 03/17/20 0354)    ED Course  I have reviewed the triage vital signs and the nursing notes.  Pertinent labs & imaging results that were available  during my care of the patient were reviewed by me and considered in my medical decision making (see chart for details).    MDM Rules/Calculators/A&P  86 year old female presenting to the ED with fever and generalized weakness.  States she has been feeling poorly over the past few days with low energy and fatigue.  She has not had any falls or syncope.  Fever just began today, was unaware of this prior to EMS arrival.  She is febrile to 100.42F rectally here but non-toxic in appearance.  She is on 4L nasal cannula which is baseline for her.  She does not have any signs of respiratory distress.  I do not appreciate any significant edema of her legs.  Given her fever, labs pending along with blood and urine cultures, chest x-ray.  Tylenol has been given for fever by EMS just prior to arrival.  Labs as above-- normal WBC count, normal lactate.  Mildly elevated trop and BNP but much lower than previous.  Denies chest pain, no acute ischemic changes on EKG.  CXR without signs of pulmonary edema.  Clinically she does not appear fluid overloaded.  Awaiting UA.  Patient is incontinent so will in and out cath.  2:50 AM UA appears infectious with >50 WBC, large leuks.  Culture pending.  Last culture with insufficient growth, prior to that with culture grew multiple species.  Patient remains awake and alert, oriented to her baseline.  Remains hemodynamically stable.  At this point, no clinical findings suggestive of sepsis.  I do feel it is reasonable to treat with dose of IV antibiotic here and discharge home with oral.  Patient and daughter at bedside are agreeable.  4:19 AM IV abx finished. Patient able to ambulate with walker here which is baseline.  She feels comfortable going home.  Will plan to discharge home with keflex pending urine  culture.  Will have her continue tylenol/motrin for fever.  Close follow-up with PCP.  Return here for any new/acute changes.  Shared visit with attending physician, Dr. Leonides Schanz, who evaluated patient and agrees with plan of care.  Final Clinical Impression(s) / ED Diagnoses Final diagnoses:  Acute cystitis with hematuria  Fever, unspecified fever cause    Rx / DC Orders ED Discharge Orders         Ordered    cephALEXin (KEFLEX) 500 MG capsule  2 times daily     Discontinue  Reprint     03/17/20 Bladen, Kierah Goatley M, PA-C 03/17/20 0425    Ward, Delice Bison, DO 03/17/20 [phone]

## 2020-03-17 DIAGNOSIS — N3001 Acute cystitis with hematuria: Secondary | ICD-10-CM | POA: Diagnosis not present

## 2020-03-17 LAB — URINALYSIS, ROUTINE W REFLEX MICROSCOPIC
Bilirubin Urine: NEGATIVE
Glucose, UA: NEGATIVE mg/dL
Ketones, ur: NEGATIVE mg/dL
Nitrite: NEGATIVE
Protein, ur: NEGATIVE mg/dL
Specific Gravity, Urine: 1.005 (ref 1.005–1.030)
WBC, UA: >50 WBC/hpf — ABNORMAL HIGH (ref 0–5)
pH: 7 (ref 5.0–8.0)

## 2020-03-17 LAB — CBC WITH DIFFERENTIAL/PLATELET
Abs Immature Granulocytes: 0.03 10*3/uL (ref 0.00–0.07)
Basophils Absolute: 0.1 10*3/uL (ref 0.0–0.1)
Basophils Relative: 1 %
Eosinophils Absolute: 0.1 10*3/uL (ref 0.0–0.5)
Eosinophils Relative: 1 %
HCT: 34.5 % — ABNORMAL LOW (ref 36.0–46.0)
Hemoglobin: 10.8 g/dL — ABNORMAL LOW (ref 12.0–15.0)
Immature Granulocytes: 0 %
Lymphocytes Relative: 9 %
Lymphs Abs: 0.8 10*3/uL (ref 0.7–4.0)
MCH: 27.8 pg (ref 26.0–34.0)
MCHC: 31.3 g/dL (ref 30.0–36.0)
MCV: 88.7 fL (ref 80.0–100.0)
Monocytes Absolute: 0.9 10*3/uL (ref 0.1–1.0)
Monocytes Relative: 10 %
Neutro Abs: 6.9 10*3/uL (ref 1.7–7.7)
Neutrophils Relative %: 79 %
Platelets: 189 10*3/uL (ref 150–400)
RBC: 3.89 MIL/uL (ref 3.87–5.11)
RDW: 13.9 % (ref 11.5–15.5)
WBC: 8.7 10*3/uL (ref 4.0–10.5)
nRBC: 0 % (ref 0.0–0.2)

## 2020-03-17 LAB — COMPREHENSIVE METABOLIC PANEL
ALT: 10 U/L (ref 0–44)
AST: 15 U/L (ref 15–41)
Albumin: 3.5 g/dL (ref 3.5–5.0)
Alkaline Phosphatase: 67 U/L (ref 38–126)
Anion gap: 10 (ref 5–15)
BUN: 18 mg/dL (ref 8–23)
CO2: 25 mmol/L (ref 22–32)
Calcium: 9 mg/dL (ref 8.9–10.3)
Chloride: 99 mmol/L (ref 98–111)
Creatinine, Ser: 0.84 mg/dL (ref 0.44–1.00)
GFR calc Af Amer: >60 mL/min (ref >60)
GFR calc non Af Amer: >60 mL/min (ref >60)
Glucose, Bld: 109 mg/dL — ABNORMAL HIGH (ref 70–99)
Potassium: 4.1 mmol/L (ref 3.5–5.1)
Sodium: 134 mmol/L — ABNORMAL LOW (ref 135–145)
Total Bilirubin: 0.6 mg/dL (ref 0.3–1.2)
Total Protein: 6.7 g/dL (ref 6.5–8.1)

## 2020-03-17 LAB — TROPONIN I (HIGH SENSITIVITY): Troponin I (High Sensitivity): 21 ng/L — ABNORMAL HIGH (ref <18)

## 2020-03-17 LAB — SARS CORONAVIRUS 2 BY RT PCR (HOSPITAL ORDER, PERFORMED IN ~~LOC~~ HOSPITAL LAB): SARS Coronavirus 2: NEGATIVE

## 2020-03-17 LAB — LACTIC ACID, PLASMA: Lactic Acid, Venous: 1.6 mmol/L (ref 0.5–1.9)

## 2020-03-17 LAB — BRAIN NATRIURETIC PEPTIDE: B Natriuretic Peptide: 662.9 pg/mL — ABNORMAL HIGH (ref 0.0–100.0)

## 2020-03-17 MED ORDER — CEPHALEXIN 500 MG PO CAPS
500.0000 mg | ORAL_CAPSULE | Freq: Two times a day (BID) | ORAL | 0 refills | Status: DC
Start: 2020-03-17 — End: 2020-04-17

## 2020-03-17 MED ORDER — SODIUM CHLORIDE 0.9 % IV SOLN
1.0000 g | Freq: Once | INTRAVENOUS | Status: AC
  Administered 2020-03-17: 1 g via INTRAVENOUS
  Filled 2020-03-17: qty 10

## 2020-03-17 NOTE — Discharge Instructions (Addendum)
Take the prescribed medication as directed.  Can continue tylenol or motrin as needed for fever.   Follow-up with your primary care doctor. Return to the ED for new or worsening symptoms.

## 2020-03-17 NOTE — ED Notes (Signed)
Patient verbalizes understanding of discharge instructions. Opportunity for questioning and answers were provided. Armband removed by staff, pt discharged from ED stable & ambulatory with family

## 2020-03-17 NOTE — ED Notes (Signed)
Pt ambulated with home O2 using walker, no assistance from staff needed. Pt voiced confidence, saying she "feels so much better" than she did on arrival. NAD noted while ambulating, gait shuffled but steady with walker. Daughter at bedside, also pleased with pt ambulation.

## 2020-03-18 LAB — URINE CULTURE: Culture: NO GROWTH

## 2020-03-20 DIAGNOSIS — I110 Hypertensive heart disease with heart failure: Secondary | ICD-10-CM | POA: Diagnosis not present

## 2020-03-21 LAB — CULTURE, BLOOD (ROUTINE X 2)
Culture: NO GROWTH
Culture: NO GROWTH
Special Requests: ADEQUATE
Special Requests: ADEQUATE

## 2020-03-25 ENCOUNTER — Other Ambulatory Visit (HOSPITAL_COMMUNITY): Payer: Self-pay | Admitting: Nurse Practitioner

## 2020-03-25 NOTE — Telephone Encounter (Signed)
Rx has been sent to the pharmacy electronically. ° °

## 2020-04-08 DIAGNOSIS — H5010 Unspecified exotropia: Secondary | ICD-10-CM | POA: Diagnosis not present

## 2020-04-14 ENCOUNTER — Telehealth: Payer: Self-pay | Admitting: Cardiology

## 2020-04-14 NOTE — Telephone Encounter (Signed)
Patient c/o Palpitations:  High priority if patient c/o lightheadedness, shortness of breath, or chest pain  1) How long have you had palpitations/irregular HR/ Afib? Are you having the symptoms now?   Patient's daughter, Rebecca [facility], states the patient went into afib this morning. Per [facility], the patient is also feeling jittery and weak. Patient denies additional symptoms.  2) Are you currently experiencing lightheadedness, SOB or CP? No  3) Do you have a history of afib (atrial fibrillation) or irregular heart rhythm? Yes  4) Have you checked your BP or HR? (document readings if available):  Patient's daughter states patient's HR has been in the 70's. BP was around 137/80 (estimate) this morning. 5) Are you experiencing any other symptoms? Weakness, jitters

## 2020-04-14 NOTE — Telephone Encounter (Signed)
Schedule appt in atrial fibrillation clinic [facility]

## 2020-04-14 NOTE — Telephone Encounter (Signed)
Spoke with patient's daughter Rebecca Norman. Patient went into AFib this morning, per daughter who is a Marine scientist. She listened to her heart and lungs, ausculted irregularity. HR is maintaining in 70s. Pulse ox 93-96%. BP 137/80 this morning - generally a little lower than this. If she gets anxious, BP will increase. She is having jittery feeling in her chest. She has not had an AFib episode in a while. She has been off amiodarone for over 1 year.  Patient had UTI 1 month ago also. No other complaints  Advised will notify MD for advice - has routine appointment in October

## 2020-04-14 NOTE — Telephone Encounter (Signed)
Spoke with pt daughter, aware of appointment 04/17/20 @ 11 am in the atrial fib clinic.

## 2020-04-17 ENCOUNTER — Other Ambulatory Visit: Payer: Self-pay

## 2020-04-17 ENCOUNTER — Ambulatory Visit (HOSPITAL_COMMUNITY)
Admission: RE | Admit: 2020-04-17 | Discharge: 2020-04-17 | Disposition: A | Discharge status: Home / Self Care | Payer: Medicare Other | Source: Ambulatory Visit | Attending: Nurse Practitioner | Admitting: Nurse Practitioner

## 2020-04-17 ENCOUNTER — Encounter (HOSPITAL_COMMUNITY): Payer: Self-pay | Admitting: Nurse Practitioner

## 2020-04-17 VITALS — BP 146/68 | HR 70 | Ht 64.0 in | Wt 160.0 lb

## 2020-04-17 DIAGNOSIS — I480 Paroxysmal atrial fibrillation: Secondary | ICD-10-CM | POA: Diagnosis not present

## 2020-04-17 DIAGNOSIS — Z87891 Personal history of nicotine dependence: Secondary | ICD-10-CM | POA: Diagnosis not present

## 2020-04-17 DIAGNOSIS — I10 Essential (primary) hypertension: Secondary | ICD-10-CM | POA: Diagnosis not present

## 2020-04-17 DIAGNOSIS — D6869 Other thrombophilia: Secondary | ICD-10-CM | POA: Diagnosis not present

## 2020-04-17 DIAGNOSIS — Z7901 Long term (current) use of anticoagulants: Secondary | ICD-10-CM | POA: Insufficient documentation

## 2020-04-17 DIAGNOSIS — Z79899 Other long term (current) drug therapy: Secondary | ICD-10-CM | POA: Insufficient documentation

## 2020-04-17 DIAGNOSIS — I4891 Unspecified atrial fibrillation: Secondary | ICD-10-CM | POA: Insufficient documentation

## 2020-04-17 DIAGNOSIS — E039 Hypothyroidism, unspecified: Secondary | ICD-10-CM | POA: Insufficient documentation

## 2020-04-17 DIAGNOSIS — M5031 Other cervical disc degeneration,  high cervical region: Secondary | ICD-10-CM | POA: Diagnosis not present

## 2020-04-17 NOTE — Progress Notes (Signed)
Primary Care Physician: [name], MD Referring Physician: Dr. Wilburn Cornelia [facility] is a 86 y.o. female with a h/o paroxysmal afib that is in the afib clinic as her daughter, who  is a retired Marine scientist, heard an irregular heart beat. Pt is in her usual state of health. She has some morning weakness but after her meds, breakfast and coffee she feels better thru the day. She was on amiodarone but this was stopped  05/2019 for bradycardia. Pt does voice any concerns today. She is not aware of her heart beat. She continue on eliquis 5 mg bid for a CHA2DS2VASc score of 5.   Today, she denies symptoms of palpitations, chest pain, shortness of breath, orthopnea, PND, lower extremity edema, dizziness, presyncope, syncope, or neurologic sequela. The patient is tolerating medications without difficulties and is otherwise without complaint today.   Past Medical History:  Diagnosis Date   DDD (degenerative disc disease), cervical    severe/  auto fusion c2-c5   Diverticulosis    Hemorrhoids    History of adenomatous polyp of colon    1995  adenomatous polyp/  1997 & 2003  hyperplastic polyp's   History of bladder cancer    s/p  TURBT's, urologist-  Dr [name]   History of cancer of ureter    Hypertension    Hypothyroid    Nocturia    Osteoarthritis    Osteopenia    Symptomatic bradycardia 05/06/2019   Past Surgical History:  Procedure Laterality Date   BACK SURGERY     CARDIOVERSION N/A 08/09/2017   Procedure: CARDIOVERSION;  Surgeon: Larey Dresser, MD;  Location: [place];  Service: Cardiovascular;  Laterality: N/A;   CATARACT EXTRACTION W/ INTRAOCULAR LENS  IMPLANT, BILATERAL Bilateral    CYSTOSCOPY W/ RETROGRADES  10/03/2011   Procedure: CYSTOSCOPY WITH RETROGRADE PYELOGRAM;  Surgeon: Molli Hazard, MD;  Location: [facility];  Service: Urology;;   CYSTOSCOPY W/ RETROGRADES Bilateral 06/17/2016   Procedure: CYSTOSCOPY WITH RETROGRADE  PYELOGRAM;  Surgeon: Alexis Frock, MD;  [facility];  Service: Urology;  Laterality: Bilateral;   CYSTOSCOPY WITH BIOPSY N/A 06/17/2016   Procedure: CYSTOSCOPY WITH BIOPSY AND FULGERATION;  Surgeon: Alexis Frock, MD;  Location: [facility];  Service: Urology;  Laterality: N/A;   LAMINECTOMY  2009   L3 - 4   ORIF ANKLE FRACTURE Left 08/28/2017   Procedure: OPEN REDUCTION INTERNAL FIXATION (ORIF) ANKLE FRACTURE;  Surgeon: Shona Needles, MD;  Location: [facility];  Service: Orthopedics;  Laterality: Left;   TRANSURETHRAL RESECTION OF BLADDER  x3 --  2009; 2010; 06-29-2010   TRANSURETHRAL RESECTION OF BLADDER TUMOR  10/03/2011   Procedure: TRANSURETHRAL RESECTION OF BLADDER TUMOR (TURBT);  Surgeon: Molli Hazard, MD;  Location: [facility];  Service: Urology;  Laterality: N/A;    Current Outpatient Medications  Medication Sig Dispense Refill   acetaminophen (TYLENOL) 500 MG tablet Take 500 mg by mouth every 6 (six) hours as needed for headache (pain).      apixaban (ELIQUIS) 5 MG TABS tablet Take 1 tablet (5 mg total) by mouth 2 (two) times daily. 180 tablet 2   furosemide (LASIX) 20 MG tablet Take 1 tablet (20 mg total) by mouth daily. 90 tablet 3   levothyroxine (SYNTHROID, LEVOTHROID) 50 MCG tablet Take 50-75 mcg by mouth See admin instructions. Alternate taking 1 tablet on day then take 1 and 1/2 tablets the next day  oxybutynin (DITROPAN) 5 MG tablet Take 5 mg by mouth at bedtime as needed for bladder spasms.      OXYGEN Inhale 4.5 L into the lungs continuous.      sertraline (ZOLOFT) 50 MG tablet Take 50 mg by mouth at bedtime.      No current facility-administered medications for this encounter.    Allergies  Allergen Reactions   Flagyl [Metronidazole] Hives   Lisinopril Cough    Social History   Socioeconomic History   Marital status: Widowed    Spouse name: Not on file   Number of children: Not  on file   Years of education: Not on file   Highest education level: Not on file  Occupational History   Occupation: Retired  Tobacco Use   Smoking status: Former Smoker    Years: 10.00    Types: Cigarettes    Quit date: 09/25/2005    Years since quitting: 14.5   Smokeless tobacco: Never Used  Vaping Use   Vaping Use: Never used  Substance and Sexual Activity   Alcohol use: Yes   Drug use: No   Sexual activity: Not on file  Other Topics Concern   Not on file  Social History Narrative   She lives alone in [place].  Daughter helps in her care.   Social Determinants of Health   Financial Resource Strain:    Difficulty of Paying Living Expenses:   Food Insecurity:    Worried About Charity fundraiser in the Last Year:    Arboriculturist in the Last Year:   Transportation Needs:    Film/video editor (Medical):    Lack of Transportation (Non-Medical):   Physical Activity:    Days of Exercise per Week:    Minutes of Exercise per Session:   Stress:    Feeling of Stress :   Social Connections:    Frequency of Communication with Friends and Family:    Frequency of Social Gatherings with Friends and Family:    Attends Religious Services:    Active Member of Clubs or Organizations:    Attends Music therapist:    Marital Status:   Intimate Partner Violence:    Fear of Current or Ex-Partner:    Emotionally Abused:    Physically Abused:    Sexually Abused:     Family History  Problem Relation Age of Onset   Breast cancer Sister    Stomach cancer Sister    Hernia Sister 82       HERNIA SURGERY COMPLICATIONS   Colon cancer Neg Hx     ROS- All systems are reviewed and negative except as per the HPI above  Physical Exam: Vitals:   04/17/20 1108  BP: (!) 146/68  Pulse: 70  SpO2: 100%  Weight: 72.6 kg  Height: 5\' 4"  (1.626 m)   Wt Readings from Last 3 Encounters:  04/17/20 72.6 kg  03/16/20 73.9 kg  10/07/19  76.7 kg    Labs: Lab Results  Component Value Date   NA 134 (L) 03/16/2020   K 4.1 03/16/2020   CL 99 03/16/2020   CO2 25 03/16/2020   GLUCOSE 109 (H) 03/16/2020   BUN 18 03/16/2020   CREATININE 0.84 03/16/2020   CALCIUM 9.0 03/16/2020   PHOS 3.4 02/08/2018   MG 1.9 05/06/2019   Lab Results  Component Value Date   INR 1.32 03/25/2018   Lab Results  Component Value Date   CHOL 127 05/09/2019  HDL 40 (L) 05/09/2019   LDLCALC 65 05/09/2019   TRIG 110 05/09/2019     GEN- The patient is well appearing, alert and oriented x 3 today.   Head- normocephalic, atraumatic Eyes-  Sclera clear, conjunctiva pink Ears- hearing intact Oropharynx- clear Neck- supple, no JVP Lymph- no cervical lymphadenopathy Lungs- Clear to ausculation bilaterally, normal work of breathing Heart- Regular rate and rhythm, no murmurs, rubs or gallops, PMI not laterally displaced GI- soft, NT, ND, + BS Extremities- no clubbing, cyanosis, or edema MS- no significant deformity or atrophy Skin- no rash or lesion Psych- euthymic mood, full affect Neuro- strength and sensation are intact  EKG-SR with PAC's pr int 152 ms, qrs int 92, qtc 467 ms    Assessment and Plan: 1. AFib Pt is  in clinic as her daughter auscultated an irregular heart rhythm  This is consistent with PAC's on ekg Pt does not feel different from her baseline.  AV nodal agents have been avoided in the past 2/2 bradycardia, amiodarone was stopped for this reason as well  A Zio patch was offered but pt declined Therefore no change in treatment  2. CHA2DS2VASc  Score of 5 Continue  eliquis 5 mg bid   F/u with Dr. [name] 10/27  Geroge Baseman. Tabetha Haraway, [facility] [address] [place], [place] [address] [phone]

## 2020-05-06 DIAGNOSIS — R634 Abnormal weight loss: Secondary | ICD-10-CM | POA: Diagnosis not present

## 2020-05-06 DIAGNOSIS — E039 Hypothyroidism, unspecified: Secondary | ICD-10-CM | POA: Diagnosis not present

## 2020-05-06 DIAGNOSIS — R5383 Other fatigue: Secondary | ICD-10-CM | POA: Diagnosis not present

## 2020-05-12 ENCOUNTER — Other Ambulatory Visit: Payer: Self-pay

## 2020-05-12 ENCOUNTER — Encounter: Payer: Self-pay | Admitting: Podiatry

## 2020-05-12 ENCOUNTER — Ambulatory Visit (INDEPENDENT_AMBULATORY_CARE_PROVIDER_SITE_OTHER): Payer: Medicare Other | Admitting: Podiatry

## 2020-05-12 DIAGNOSIS — B351 Tinea unguium: Secondary | ICD-10-CM

## 2020-05-12 DIAGNOSIS — M79674 Pain in right toe(s): Secondary | ICD-10-CM | POA: Diagnosis not present

## 2020-05-12 DIAGNOSIS — D689 Coagulation defect, unspecified: Secondary | ICD-10-CM

## 2020-05-12 DIAGNOSIS — M79675 Pain in left toe(s): Secondary | ICD-10-CM | POA: Diagnosis not present

## 2020-05-12 NOTE — Progress Notes (Signed)
This patient returns to my office for at risk foot care.  This patient requires this care by a professional since this patient will be at risk due to having coagulation disorder.  Patient is taking eliquis.This patient presents to the office in a wheelchair with her daughter This patient is unable to cut nails herself since the patient cannot reach her nails.These nails are painful walking and wearing shoes.  This patient presents for at risk foot care today.  General Appearance  Alert, conversant and in no acute stress.  Vascular  Dorsalis pedis   pulses are palpable  bilaterally. Posterior tibial pulses are not palpable  B/L. Capillary return is within normal limits  bilaterally. Temperature is within normal limits  bilaterally.  Neurologic  Senn-Weinstein monofilament wire test within normal limits  bilaterally. Muscle power within normal limits bilaterally.  Nails Thick disfigured discolored nails with subungual debris  from hallux to fifth toes bilaterally. No evidence of bacterial infection or drainage bilaterally.  Orthopedic  No limitations of motion  feet .  No crepitus or effusions noted.  HAV with overlapping second toe  B/L.  Plantar flexed met second left foot.    Skin  normotropic skin with no porokeratosis noted bilaterally.  No signs of infections or ulcers noted.     Onychomycosis  Pain in right toes  Pain in left toes  Consent was obtained for treatment procedures.   Mechanical debridement of nails 1-5  bilaterally performed with a nail nipper.  Filed with dremel without incident.  Told to use pad with tape.   Return office visit   3 months                   Told patient to return for periodic foot care and evaluation due to potential at risk complications.   Gardiner Barefoot DPM

## 2020-06-11 DIAGNOSIS — Z23 Encounter for immunization: Secondary | ICD-10-CM | POA: Diagnosis not present

## 2020-06-29 DIAGNOSIS — C670 Malignant neoplasm of trigone of bladder: Secondary | ICD-10-CM | POA: Diagnosis not present

## 2020-06-29 DIAGNOSIS — N815 Vaginal enterocele: Secondary | ICD-10-CM | POA: Diagnosis not present

## 2020-06-29 DIAGNOSIS — N8111 Cystocele, midline: Secondary | ICD-10-CM | POA: Diagnosis not present

## 2020-07-01 ENCOUNTER — Ambulatory Visit: Payer: Medicare Other | Admitting: Cardiology

## 2020-07-13 DIAGNOSIS — Z23 Encounter for immunization: Secondary | ICD-10-CM | POA: Diagnosis not present

## 2020-07-29 ENCOUNTER — Ambulatory Visit: Payer: Medicare Other | Admitting: General Practice

## 2020-08-11 ENCOUNTER — Ambulatory Visit: Payer: Medicare Other | Admitting: Podiatry

## 2020-08-18 ENCOUNTER — Other Ambulatory Visit: Payer: Self-pay

## 2020-08-18 ENCOUNTER — Encounter: Payer: Self-pay | Admitting: Podiatry

## 2020-08-18 ENCOUNTER — Ambulatory Visit (INDEPENDENT_AMBULATORY_CARE_PROVIDER_SITE_OTHER): Payer: Medicare Other | Admitting: Podiatry

## 2020-08-18 DIAGNOSIS — D689 Coagulation defect, unspecified: Secondary | ICD-10-CM

## 2020-08-18 DIAGNOSIS — B351 Tinea unguium: Secondary | ICD-10-CM

## 2020-08-18 DIAGNOSIS — M79674 Pain in right toe(s): Secondary | ICD-10-CM

## 2020-08-18 DIAGNOSIS — M79675 Pain in left toe(s): Secondary | ICD-10-CM | POA: Diagnosis not present

## 2020-08-18 NOTE — Progress Notes (Signed)
This patient returns to my office for at risk foot care.  This patient requires this care by a professional since this patient will be at risk due to having coagulation disorder.  Patient is taking eliquis.This patient presents to the office in a wheelchair with her daughter This patient is unable to cut nails herself since the patient cannot reach her nails.These nails are painful walking and wearing shoes.  This patient presents for at risk foot care today.  General Appearance  Alert, conversant and in no acute stress.  Vascular  Dorsalis pedis   pulses are weakly  palpable  bilaterally. Posterior tibial pulses are not palpable  B/L. Capillary return is within normal limits  bilaterally. Temperature is within normal limits  bilaterally.  Neurologic  Senn-Weinstein monofilament wire test within normal limits  bilaterally. Muscle power within normal limits bilaterally.  Nails Thick disfigured discolored nails with subungual debris  from hallux to fifth toes bilaterally. No evidence of bacterial infection or drainage bilaterally.  Orthopedic  No limitations of motion  feet .  No crepitus or effusions noted.  HAV with overlapping second toe  B/L.  Plantar flexed met second left foot.    Skin  normotropic skin with no porokeratosis noted bilaterally.  No signs of infections or ulcers noted.     Onychomycosis  Pain in right toes  Pain in left toes  Consent was obtained for treatment procedures.   Mechanical debridement of nails 1-5  bilaterally performed with a nail nipper.  Filed with dremel without incident.     Return office visit   3 months                   Told patient to return for periodic foot care and evaluation due to potential at risk complications.   Gardiner Barefoot DPM

## 2020-08-24 ENCOUNTER — Ambulatory Visit (INDEPENDENT_AMBULATORY_CARE_PROVIDER_SITE_OTHER): Payer: Medicare Other | Admitting: Physician Assistant

## 2020-08-24 ENCOUNTER — Other Ambulatory Visit: Payer: Self-pay

## 2020-08-24 ENCOUNTER — Encounter: Payer: Self-pay | Admitting: Physician Assistant

## 2020-08-24 VITALS — BP 121/58 | HR 79 | Temp 97.3°F | Ht 64.0 in | Wt 152.0 lb

## 2020-08-24 DIAGNOSIS — I480 Paroxysmal atrial fibrillation: Secondary | ICD-10-CM | POA: Diagnosis not present

## 2020-08-24 DIAGNOSIS — Z8673 Personal history of transient ischemic attack (TIA), and cerebral infarction without residual deficits: Secondary | ICD-10-CM

## 2020-08-24 DIAGNOSIS — J9611 Chronic respiratory failure with hypoxia: Secondary | ICD-10-CM

## 2020-08-24 DIAGNOSIS — R001 Bradycardia, unspecified: Secondary | ICD-10-CM

## 2020-08-24 DIAGNOSIS — E039 Hypothyroidism, unspecified: Secondary | ICD-10-CM | POA: Diagnosis not present

## 2020-08-24 DIAGNOSIS — I10 Essential (primary) hypertension: Secondary | ICD-10-CM

## 2020-08-24 NOTE — Patient Instructions (Signed)
Medication Instructions:  No Changes *If you need a refill on your cardiac medications before your next appointment, please call your pharmacy*   Lab Work: No labs If you have labs (blood work) drawn today and your tests are completely normal, you will receive your results only by: Marland Kitchen MyChart Message (if you have MyChart) OR . A paper copy in the mail If you have any lab test that is abnormal or we need to change your treatment, we will call you to review the results.   Testing/Procedures: No Testing   Follow-Up: At [facility], you and your health needs are our priority.  As part of our continuing mission to provide you with exceptional heart care, we have created designated Provider Care Teams.  These Care Teams include your primary Cardiologist (physician) and Advanced Practice Providers (APPs -  Physician Assistants and Nurse Practitioners) who all work together to provide you with the care you need, when you need it.  We recommend signing up for the patient portal called "MyChart".  Sign up information is provided on this After Visit Summary.  MyChart is used to connect with patients for Virtual Visits (Telemedicine).  Patients are able to view lab/test results, encounter notes, upcoming appointments, etc.  Non-urgent messages can be sent to your provider as well.   To learn more about what you can do with MyChart, go to [email or web address].    Your next appointment:   6 month(s)  The format for your next appointment:   In Person  Provider:   Kirk Ruths, MD

## 2020-08-24 NOTE — Progress Notes (Signed)
Cardiology Office Note:    Date:  08/26/2020   ID:  Rebecca Norman, DOB 05-Dec-1924, MRN [id number]  PCP:  [name], MD  [facility] Cardiologist:  Kirk Ruths, MD  [place] Electrophysiologist:  None   Referring MD: [name], MD   Chief Complaint  Patient presents with  . Follow-up    Seen for Dr. [name]    History of Present Illness:    Rebecca Norman is a 86 y.o. female with a hx of PAF, hypertension, hypothyroidism, history of bladder cancer and history of bradycardia.  Heart monitor in October 2018 showed PAF.  Echocardiogram in March 2019 showed EF 65 to 70%, mild aortic stenosis, mild MR, moderate LAE.  Patient was admitted in September 2020 with generalized weakness and fall.  Heart rate was in the 40s.  On further review, it was found her heart rate was normal in sinus rhythm in the 50s with PACs.  It was not felt bradycardia was the main culprit for her weakness.  Neurology assessment was recommended.  MRI did show acute infarct in the right pons, right cerebellum, right cortex and the left Podoben.  There were scattered area of hemorrhage which could be subacute versus chronic.  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  Due to persistent bradycardia on telemetry, her amiodarone was discontinued.  She was subsequently discharged to [facility] skilled nursing facility for rehab then eventually home.  More recently, patient was seen in the A. fib clinic in August 2021 due to irregular heartbeat.  EKG showed sinus rhythm with PACs.  A Zio patch was offered however patient declined.  Patient presents today for follow-up.  She denies any recent chest pain or shortness of breath.  She lives by herself however her daughter comes to visit her on a weekly basis and one of her son live within 2 miles from her.  She also has a helper that comes to her home several times a week to assist her as well.  Otherwise she cooks by herself and has been fairly  independent.  She does wear oxygen 24/7.  Otherwise she has not had any major palpitation recently.  EKG continue to show sinus rhythm with rare PVC.  There has been no obvious recurrence of atrial fibrillation.   Past Medical History:  Diagnosis Date  . DDD (degenerative disc disease), cervical    severe/  auto fusion c2-c5  . Diverticulosis   . Hemorrhoids   . History of adenomatous polyp of colon    1995  adenomatous polyp/  1997 & 2003  hyperplastic polyp's  . History of bladder cancer    s/p  TURBT's, urologist-  Dr [name]  . History of cancer of ureter   . Hypertension   . Hypothyroid   . Nocturia   . Osteoarthritis   . Osteopenia   . Symptomatic bradycardia 05/06/2019    Past Surgical History:  Procedure Laterality Date  . BACK SURGERY    . CARDIOVERSION N/A 08/09/2017   Procedure: CARDIOVERSION;  Surgeon: Larey Dresser, MD;  Location: [facility] ENDOSCOPY;  Service: Cardiovascular;  Laterality: N/A;  . CATARACT EXTRACTION W/ INTRAOCULAR LENS  IMPLANT, BILATERAL Bilateral   . CYSTOSCOPY W/ RETROGRADES  10/03/2011   Procedure: CYSTOSCOPY WITH RETROGRADE PYELOGRAM;  Surgeon: Molli Hazard, MD;  Location: [facility];  Service: Urology;;  . Consuela Mimes W/ RETROGRADES Bilateral 06/17/2016   Procedure: CYSTOSCOPY WITH RETROGRADE PYELOGRAM;  Surgeon: Alexis Frock, MD;  Location: [facility];  Service: Urology;  Laterality: Bilateral;  . CYSTOSCOPY WITH BIOPSY N/A 06/17/2016   Procedure: CYSTOSCOPY WITH BIOPSY AND FULGERATION;  Surgeon: Alexis Frock, MD;  Location: [facility];  Service: Urology;  Laterality: N/A;  . LAMINECTOMY  2009   L3 - 4  . ORIF ANKLE FRACTURE Left 08/28/2017   Procedure: OPEN REDUCTION INTERNAL FIXATION (ORIF) ANKLE FRACTURE;  Surgeon: Shona Needles, MD;  Location: [facility] ORS;  Service: Orthopedics;  Laterality: Left;  . TRANSURETHRAL RESECTION OF BLADDER  x3 --  2009; 2010; 06-29-2010  . TRANSURETHRAL  RESECTION OF BLADDER TUMOR  10/03/2011   Procedure: TRANSURETHRAL RESECTION OF BLADDER TUMOR (TURBT);  Surgeon: Molli Hazard, MD;  Location: [facility];  Service: Urology;  Laterality: N/A;    Current Medications: Current Meds  Medication Sig  . acetaminophen (TYLENOL) 500 MG tablet 1 tablet as needed  . apixaban (ELIQUIS) 5 MG TABS tablet Take 1 tablet (5 mg total) by mouth 2 (two) times daily.  . Cholecalciferol (VITAMIN D) 50 MCG (2000 UT) tablet 1 tablet  . ciprofloxacin (CIPRO) 250 MG tablet 1 tablet  . famotidine (PEPCID) 40 MG tablet 1 tablet at bedtime.  . furosemide (LASIX) 20 MG tablet Take 1 tablet (20 mg total) by mouth daily.  Marland Kitchen levothyroxine (SYNTHROID) 50 MCG tablet 1 tablet in the morning on an empty stomach  . oxybutynin (DITROPAN) 5 MG tablet Take 5 mg by mouth at bedtime as needed for bladder spasms.   . OXYGEN Inhale 4.5 L into the lungs continuous.   . sertraline (ZOLOFT) 50 MG tablet Take 1 tablet by mouth daily.  Marland Kitchen UNABLE TO FIND 5 L continuous as directed  . vitamin B-12 (CYANOCOBALAMIN) 1000 MCG tablet 1 tablet     Allergies:   Lisinopril and Metronidazole   Social History   Socioeconomic History  . Marital status: Widowed    Spouse name: Not on file  . Number of children: Not on file  . Years of education: Not on file  . Highest education level: Not on file  Occupational History  . Occupation: Retired  Tobacco Use  . Smoking status: Former Smoker    Years: 10.00    Types: Cigarettes    Quit date: 09/25/2005    Years since quitting: 14.9  . Smokeless tobacco: Never Used  Vaping Use  . Vaping Use: Never used  Substance and Sexual Activity  . Alcohol use: Yes  . Drug use: No  . Sexual activity: Not on file  Other Topics Concern  . Not on file  Social History Narrative   She lives alone in [place].  Daughter helps in her care.   Social Determinants of Health   Financial Resource Strain: Not on file  Food Insecurity:  Not on file  Transportation Needs: Not on file  Physical Activity: Not on file  Stress: Not on file  Social Connections: Not on file     Family History: The patient's family history includes Breast cancer in her sister; Hernia (age of onset: 104) in her sister; Stomach cancer in her sister. There is no history of Colon cancer.  ROS:   Please see the history of present illness.     All other systems reviewed and are negative.  EKGs/Labs/Other Studies Reviewed:    The following studies were reviewed today:  Echo 05/07/2019 1. The left ventricle has hyperdynamic systolic function, with an  ejection fraction of >65%. The cavity size was normal. There is mild  concentric left ventricular hypertrophy. Left ventricular diastolic  Doppler parameters are consistent with  pseudonormalization. Elevated mean left atrial pressure No evidence of  left ventricular regional wall motion abnormalities.  2. The right ventricle has normal systolic function. The cavity was  normal. There is no increase in right ventricular wall thickness. Right  ventricular systolic pressure is mildly elevated with an estimated  pressure of 45.5 mmHg.  3. Left atrial size was severely dilated.  4. The mitral valve is degenerative. There is moderate mitral annular  calcification present. Mitral valve regurgitation is moderate by color  flow Doppler. The MR jet is centrally-directed. No evidence of mitral  valve stenosis.  5. The tricuspid valve is grossly normal.  6. The aortic valve is tricuspid. Mild thickening of the aortic valve.  Sclerosis without any evidence of stenosis of the aortic valve. Aortic  valve regurgitation is mild by color flow Doppler.  7. The aorta is normal unless otherwise noted.   EKG:  EKG is ordered today.  The ekg ordered today demonstrates normal sinus rhythm without significant ST-T wave changes, rare PVC.  Questionable T wave inversion in V1 through V3.  Recent Labs: 03/16/2020:  ALT 10; B Natriuretic Peptide 662.9; BUN 18; Creatinine, Ser 0.84; Hemoglobin 10.8; Platelets 189; Potassium 4.1; Sodium 134  Recent Lipid Panel    Component Value Date/Time   CHOL 127 05/09/2019 0352   TRIG 110 05/09/2019 0352   HDL 40 (L) 05/09/2019 0352   CHOLHDL 3.2 05/09/2019 0352   VLDL 22 05/09/2019 0352   LDLCALC 65 05/09/2019 0352     Risk Assessment/Calculations:       Physical Exam:    VS:  BP (!) 121/58   Pulse 79   Temp (!) 97.3 F (36.3 C)   Ht 5\' 4"  (1.626 m)   Wt 152 lb (68.9 kg)   SpO2 97%   BMI 26.09 kg/m     Wt Readings from Last 3 Encounters:  08/24/20 152 lb (68.9 kg)  04/17/20 160 lb (72.6 kg)  03/16/20 163 lb (73.9 kg)     GEN:  Well nourished, well developed in no acute distress HEENT: Normal NECK: No JVD; No carotid bruits LYMPHATICS: No lymphadenopathy CARDIAC: RRR, no murmurs, rubs, gallops RESPIRATORY:  Clear to auscultation without rales, wheezing or rhonchi  ABDOMEN: Soft, non-tender, non-distended MUSCULOSKELETAL:  No edema; No deformity  SKIN: Warm and dry NEUROLOGIC:  Alert and oriented x 3 PSYCHIATRIC:  Normal affect   ASSESSMENT:    1. Paroxysmal atrial fibrillation (HCC)   2. Essential hypertension   3. Hypothyroidism, unspecified type   4. Bradycardia   5. H/O: CVA (cerebrovascular accident)   6. Chronic respiratory failure with hypoxia (HCC)    PLAN:    In order of problems listed above:  1. Paroxysmal atrial fibrillation: No recent recurrence.  Continue on Eliquis.  Patient self rate controlled.  2. Hypertension: Blood pressure stable  3. Hypothyroidism: On levothyroxine  4. History of bradycardia: Heart rate improved after discontinuation of amiodarone  5. History of CVA: Seen on MRI of brain in September 2020.  No recurrence.  6. Chronic respiratory failure: On 24/7 home oxygen        Medication Adjustments/Labs and Tests Ordered: Current medicines are reviewed at length with the patient today.   Concerns regarding medicines are outlined above.  Orders Placed This Encounter  Procedures  . EKG 12-Lead   No orders of the defined types were placed in this encounter.   Patient Instructions  Medication Instructions:  No Changes *If you need a refill on your cardiac medications before your next appointment, please call your pharmacy*   Lab Work: No labs If you have labs (blood work) drawn today and your tests are completely normal, you will receive your results only by: Marland Kitchen MyChart Message (if you have MyChart) OR . A paper copy in the mail If you have any lab test that is abnormal or we need to change your treatment, we will call you to review the results.   Testing/Procedures: No Testing   Follow-Up: At [facility], you and your health needs are our priority.  As part of our continuing mission to provide you with exceptional heart care, we have created designated Provider Care Teams.  These Care Teams include your primary Cardiologist (physician) and Advanced Practice Providers (APPs -  Physician Assistants and Nurse Practitioners) who all work together to provide you with the care you need, when you need it.  We recommend signing up for the patient portal called "MyChart".  Sign up information is provided on this After Visit Summary.  MyChart is used to connect with patients for Virtual Visits (Telemedicine).  Patients are able to view lab/test results, encounter notes, upcoming appointments, etc.  Non-urgent messages can be sent to your provider as well.   To learn more about what you can do with MyChart, go to [email or web address].    Your next appointment:   6 month(s)  The format for your next appointment:   In Person  Provider:   Kirk Ruths, MD       Signed, Almyra Deforest, [place]  08/26/2020 11:42 PM    [place]

## 2020-08-26 ENCOUNTER — Encounter: Payer: Self-pay | Admitting: Physician Assistant

## 2020-10-06 DIAGNOSIS — H35372 Puckering of macula, left eye: Secondary | ICD-10-CM | POA: Diagnosis not present

## 2020-10-06 DIAGNOSIS — H353131 Nonexudative age-related macular degeneration, bilateral, early dry stage: Secondary | ICD-10-CM | POA: Diagnosis not present

## 2020-10-06 DIAGNOSIS — Z961 Presence of intraocular lens: Secondary | ICD-10-CM | POA: Diagnosis not present

## 2020-10-06 DIAGNOSIS — H3581 Retinal edema: Secondary | ICD-10-CM | POA: Diagnosis not present

## 2020-10-06 DIAGNOSIS — H04123 Dry eye syndrome of bilateral lacrimal glands: Secondary | ICD-10-CM | POA: Diagnosis not present

## 2020-10-20 ENCOUNTER — Telehealth: Payer: Self-pay | Admitting: *Deleted

## 2020-10-20 NOTE — Telephone Encounter (Signed)
Coverage for eliquis has been approved from 09/19/20 until 10/19/21

## 2020-10-21 ENCOUNTER — Other Ambulatory Visit: Payer: Self-pay

## 2020-10-21 ENCOUNTER — Ambulatory Visit (INDEPENDENT_AMBULATORY_CARE_PROVIDER_SITE_OTHER): Payer: Medicare Other | Admitting: Ophthalmology

## 2020-10-21 ENCOUNTER — Encounter (INDEPENDENT_AMBULATORY_CARE_PROVIDER_SITE_OTHER): Payer: Self-pay | Admitting: Ophthalmology

## 2020-10-21 DIAGNOSIS — H353124 Nonexudative age-related macular degeneration, left eye, advanced atrophic with subfoveal involvement: Secondary | ICD-10-CM | POA: Insufficient documentation

## 2020-10-21 DIAGNOSIS — H35372 Puckering of macula, left eye: Secondary | ICD-10-CM

## 2020-10-21 DIAGNOSIS — H35352 Cystoid macular degeneration, left eye: Secondary | ICD-10-CM | POA: Diagnosis not present

## 2020-10-21 DIAGNOSIS — H353132 Nonexudative age-related macular degeneration, bilateral, intermediate dry stage: Secondary | ICD-10-CM | POA: Diagnosis not present

## 2020-10-21 NOTE — Patient Instructions (Signed)
Risk and benefits of surgical intervention of the left eye and the natural course of healing postoperatively were reviewed with the patient and daughter

## 2020-10-21 NOTE — Assessment & Plan Note (Signed)

## 2020-10-21 NOTE — Assessment & Plan Note (Signed)
The nature of macular pucker (epiretinal membrane ERM) was discussed with the patient as well as threshold criteria for vitrectomy surgery. I explained that in rare cases another surgery is needed to actually remove a second wrinkle should it regrow.  Most often, the epiretinal membrane and underlying wrinkled internal limiting membrane are removed with the first surgery, to accomplish the goals.   If the operative eye is Phakic (natural lens still present), cataract surgery is often recommended prior to Vitrectomy. This will enable the retina surgeon to have the best view during surgery and the patient to obtain optimal results in the future. Treatment options were discussed. Risks and benefits reviewed.

## 2020-10-21 NOTE — Progress Notes (Signed)
10/21/2020     CHIEF COMPLAINT Patient presents for Retina Evaluation (NP ERM OS, MAC EDEMA OS, ref'd by Dr. Groat////Pt report stable vision OU. Pt denies any F/F, pain, or pressure OU. )   HISTORY OF PRESENT ILLNESS: Rebecca Norman is a 87 y.o. female who presents to the clinic today for:   HPI    Retina Evaluation    In left eye.  This started 2 months ago.  Duration of 2 months.  Context:  near vision. Additional comments: NP ERM OS, MAC EDEMA OS, ref'd by Dr. Katy Fitch    Pt report stable vision OU. Pt denies any F/F, pain, or pressure OU.        Last edited by Nichola Sizer D on 10/21/2020  1:36 PM. (History)      Referring physician: [name], MD [place],  [place] [address]  HISTORICAL INFORMATION:   Selected notes from the MEDICAL RECORD NUMBER    Lab Results  Component Value Date   HGBA1C 5.6 05/09/2019     CURRENT MEDICATIONS: No current outpatient medications on file. (Ophthalmic Drugs)   No current facility-administered medications for this visit. (Ophthalmic Drugs)   Current Outpatient Medications (Other)  Medication Sig  . acetaminophen (TYLENOL) 500 MG tablet 1 tablet as needed  . apixaban (ELIQUIS) 5 MG TABS tablet Take 1 tablet (5 mg total) by mouth 2 (two) times daily.  . Cholecalciferol (VITAMIN D) 50 MCG (2000 UT) tablet 1 tablet  . ciprofloxacin (CIPRO) 250 MG tablet 1 tablet  . famotidine (PEPCID) 40 MG tablet 1 tablet at bedtime.  . furosemide (LASIX) 20 MG tablet Take 1 tablet (20 mg total) by mouth daily.  Marland Kitchen levothyroxine (SYNTHROID) 50 MCG tablet 1 tablet in the morning on an empty stomach  . oxybutynin (DITROPAN) 5 MG tablet Take 5 mg by mouth at bedtime as needed for bladder spasms.   . OXYGEN Inhale 4.5 L into the lungs continuous.   . sertraline (ZOLOFT) 50 MG tablet Take 1 tablet by mouth daily.  Marland Kitchen UNABLE TO FIND 5 L continuous as directed  . vitamin B-12 (CYANOCOBALAMIN) 1000 MCG tablet 1 tablet    No current facility-administered medications for this visit. (Other)      REVIEW OF SYSTEMS:    ALLERGIES Allergies  Allergen Reactions  . Lisinopril Cough and Other (See Comments)  . Metronidazole Hives and Other (See Comments)    PAST MEDICAL HISTORY Past Medical History:  Diagnosis Date  . DDD (degenerative disc disease), cervical    severe/  auto fusion c2-c5  . Diverticulosis   . Hemorrhoids   . History of adenomatous polyp of colon    1995  adenomatous polyp/  1997 & 2003  hyperplastic polyp's  . History of bladder cancer    s/p  TURBT's, urologist-  Dr [name]  . History of cancer of ureter   . Hypertension   . Hypothyroid   . Nocturia   . Osteoarthritis   . Osteopenia   . Symptomatic bradycardia 05/06/2019   Past Surgical History:  Procedure Laterality Date  . BACK SURGERY    . CARDIOVERSION N/A 08/09/2017   Procedure: CARDIOVERSION;  Surgeon: Larey Dresser, MD;  Location: [facility] ENDOSCOPY;  Service: Cardiovascular;  Laterality: N/A;  . CATARACT EXTRACTION W/ INTRAOCULAR LENS  IMPLANT, BILATERAL Bilateral   . CYSTOSCOPY W/ RETROGRADES  10/03/2011   Procedure: CYSTOSCOPY WITH RETROGRADE PYELOGRAM;  Surgeon: Molli Hazard, MD;  Location: [facility];  Service: Urology;;  . Consuela Mimes W/ RETROGRADES Bilateral 06/17/2016   Procedure: CYSTOSCOPY WITH RETROGRADE PYELOGRAM;  Surgeon: Alexis Frock, MD;  Location: [facility];  Service: Urology;  Laterality: Bilateral;  . CYSTOSCOPY WITH BIOPSY N/A 06/17/2016   Procedure: CYSTOSCOPY WITH BIOPSY AND FULGERATION;  Surgeon: Alexis Frock, MD;  Location: [facility];  Service: Urology;  Laterality: N/A;  . LAMINECTOMY  2009   L3 - 4  . ORIF ANKLE FRACTURE Left 08/28/2017   Procedure: OPEN REDUCTION INTERNAL FIXATION (ORIF) ANKLE FRACTURE;  Surgeon: Shona Needles, MD;  Location: [facility];  Service: Orthopedics;  Laterality: Left;  . TRANSURETHRAL RESECTION OF BLADDER   x3 --  2009; 2010; 06-29-2010  . TRANSURETHRAL RESECTION OF BLADDER TUMOR  10/03/2011   Procedure: TRANSURETHRAL RESECTION OF BLADDER TUMOR (TURBT);  Surgeon: Molli Hazard, MD;  Location: [facility];  Service: Urology;  Laterality: N/A;    FAMILY HISTORY Family History  Problem Relation Age of Onset  . Breast cancer Sister   . Stomach cancer Sister   . Hernia Sister 16       HERNIA SURGERY COMPLICATIONS  . Colon cancer Neg Hx     SOCIAL HISTORY Social History   Tobacco Use  . Smoking status: Former Smoker    Years: 10.00    Types: Cigarettes    Quit date: 09/25/2005    Years since quitting: 15.0  . Smokeless tobacco: Never Used  Vaping Use  . Vaping Use: Never used  Substance Use Topics  . Alcohol use: Yes  . Drug use: No         OPHTHALMIC EXAM:  Base Eye Exam    Visual Acuity (ETDRS)      Right Left   Dist cc 20/100 20/400   Dist ph cc 20/70 -2 NI   Correction: Glasses       Tonometry (Tonopen, 1:43 PM)      Right Left   Pressure 16 19       Dilation    Both eyes: 1.0% Mydriacyl, 2.5% Phenylephrine @ 1:43 PM        Slit Lamp and Fundus Exam    Slit Lamp Exam      Right Left   Lids/Lashes Normal Normal   Conjunctiva/Sclera White and quiet White and quiet   Cornea Clear Clear   Anterior Chamber Deep and quiet Deep and quiet   Iris Round and reactive Round and reactive   Lens Centered posterior chamber intraocular lens Centered posterior chamber intraocular lens   Anterior Vitreous Normal Normal       Fundus Exam      Right Left   Posterior Vitreous Normal Normal   Disc Normal Normal   C/D Ratio 0.2 0.1   Macula Early age related macular degeneration, no macular thickening Retinal pigment epithelial mottling, Early age related macular degeneration, Epiretinal membrane severe topographic distortion   Vessels Normal Normal   Periphery Normal Normal          IMAGING AND PROCEDURES  Imaging and Procedures for  10/21/20  OCT, Retina - OU - Both Eyes       Right Eye Quality was good. Scan locations included subfoveal. Central Foveal Thickness: 263. Progression has been stable. Findings include abnormal foveal contour, retinal drusen , no SRF, no IRF.   Left Eye Quality was good. Scan locations included subfoveal. Central Foveal Thickness: 533. Progression has worsened. Findings include retinal drusen , abnormal foveal contour, cystoid macular  edema, epiretinal membrane.   Notes Severe topographic distortion left eye on the basis of epiretinal membrane And secondary cystoid macular edema.  Outer retinal drusen are present but no signs of CNVM OS or OD.                ASSESSMENT/PLAN:  Left epiretinal membrane The nature of macular pucker (epiretinal membrane ERM) was discussed with the patient as well as threshold criteria for vitrectomy surgery. I explained that in rare cases another surgery is needed to actually remove a second wrinkle should it regrow.  Most often, the epiretinal membrane and underlying wrinkled internal limiting membrane are removed with the first surgery, to accomplish the goals.   If the operative eye is [place] (natural lens still present), cataract surgery is often recommended prior to Vitrectomy. This will enable the retina surgeon to have the best view during surgery and the patient to obtain optimal results in the future. Treatment options were discussed. Risks and benefits reviewed.  Intermediate stage nonexudative age-related macular degeneration of both eyes The nature of age--related macular degeneration was discussed with the patient as well as the distinction between dry and wet types. Checking an Amsler Grid daily with advice to return immediately should a distortion develop, was given to the patient. The patient 's smoking status now and in the past was determined and advice based on the AREDS study was provided regarding the consumption of antioxidant  supplements. AREDS 2 vitamin formulation was recommended. Consumption of dark leafy vegetables and fresh fruits of various colors was recommended. Treatment modalities for wet macular degeneration particularly the use of intravitreal injections of anti-blood vessel growth factors was discussed with the patient. Avastin, Lucentis, and Eylea are the available options. On occasion, therapy includes the use of photodynamic therapy and thermal laser. Stressed to the patient do not rub eyes.  Patient was advised to check Amsler Grid daily and return immediately if changes are noted. Instructions on using the grid were given to the patient. All patient questions were answered.      ICD-10-CM   1. Left epiretinal membrane  H35.372 OCT, Retina - OU - Both Eyes  2. Intermediate stage nonexudative age-related macular degeneration of both eyes  H35.3132 OCT, Retina - OU - Both Eyes  3. Cystoid macular edema of left eye  H35.352 OCT, Retina - OU - Both Eyes    1.  Therapeutic options for the epiretinal membrane left eye were reviewed.  Specifically the only therapy known to improve this would be vitrectomy membrane peel the left eye.  2.  Immediate ARMD otherwise present, no other specific therapy appropriate.  3.  As always I do caution the patient not to mash or rub or compress the eye  Ophthalmic Meds Ordered this visit:  No orders of the defined types were placed in this encounter.      Return , [facility], for Patient may schedule vitrectomy membrane peel left eye, [address], OS.  Patient Instructions  Risk and benefits of surgical intervention of the left eye and the natural course of healing postoperatively were reviewed with the patient and daughter    Explained the diagnoses, plan, and follow up with the patient and they expressed understanding.  Patient expressed understanding of the importance of proper follow up care.   Clent Demark Taseen Marasigan M.D. Diseases & Surgery of the Retina and  Vitreous Retina & Diabetic [place] 10/21/20     Abbreviations: M myopia (nearsighted); A astigmatism; H hyperopia (farsighted); P  presbyopia; Mrx spectacle prescription;  CTL contact lenses; OD right eye; OS left eye; OU both eyes  XT exotropia; ET esotropia; PEK punctate epithelial keratitis; PEE punctate epithelial erosions; DES dry eye syndrome; MGD meibomian gland dysfunction; ATs artificial tears; PFAT's preservative free artificial tears; [place] nuclear sclerotic cataract; PSC posterior subcapsular cataract; ERM epi-retinal membrane; PVD posterior vitreous detachment; RD retinal detachment; DM diabetes mellitus; DR diabetic retinopathy; NPDR non-proliferative diabetic retinopathy; PDR proliferative diabetic retinopathy; CSME clinically significant macular edema; DME diabetic macular edema; dbh dot blot hemorrhages; CWS cotton wool spot; POAG primary open angle glaucoma; C/D cup-to-disc ratio; HVF humphrey visual field; GVF goldmann visual field; OCT optical coherence tomography; IOP intraocular pressure; BRVO Branch retinal vein occlusion; CRVO central retinal vein occlusion; CRAO central retinal artery occlusion; BRAO branch retinal artery occlusion; RT retinal tear; SB scleral buckle; PPV pars plana vitrectomy; VH Vitreous hemorrhage; PRP panretinal laser photocoagulation; IVK intravitreal kenalog; VMT vitreomacular traction; MH Macular hole;  NVD neovascularization of the disc; NVE neovascularization elsewhere; AREDS age related eye disease study; ARMD age related macular degeneration; POAG primary open angle glaucoma; EBMD epithelial/anterior basement membrane dystrophy; ACIOL anterior chamber intraocular lens; IOL intraocular lens; PCIOL posterior chamber intraocular lens; Phaco/IOL phacoemulsification with intraocular lens placement; [place] photorefractive keratectomy; LASIK laser assisted in situ keratomileusis; HTN hypertension; DM diabetes mellitus; COPD chronic obstructive pulmonary disease

## 2020-11-24 ENCOUNTER — Encounter: Payer: Self-pay | Admitting: Podiatry

## 2020-11-24 ENCOUNTER — Other Ambulatory Visit: Payer: Self-pay

## 2020-11-24 ENCOUNTER — Ambulatory Visit (INDEPENDENT_AMBULATORY_CARE_PROVIDER_SITE_OTHER): Payer: Medicare Other | Admitting: Podiatry

## 2020-11-24 DIAGNOSIS — D689 Coagulation defect, unspecified: Secondary | ICD-10-CM

## 2020-11-24 DIAGNOSIS — M79674 Pain in right toe(s): Secondary | ICD-10-CM

## 2020-11-24 DIAGNOSIS — M79675 Pain in left toe(s): Secondary | ICD-10-CM

## 2020-11-24 DIAGNOSIS — B351 Tinea unguium: Secondary | ICD-10-CM

## 2020-11-24 DIAGNOSIS — Q828 Other specified congenital malformations of skin: Secondary | ICD-10-CM

## 2020-11-24 NOTE — Progress Notes (Addendum)
This patient returns to my office for at risk foot care.  This patient requires this care by a professional since this patient will be at risk due to having coagulation disorder.  Patient is taking eliquis.This patient presents to the office in a wheelchair with her daughter This patient is unable to cut nails herself since the patient cannot reach her nails.These nails are painful walking and wearing shoes.  This patient presents for at risk foot care today.  General Appearance  Alert, conversant and in no acute stress.  Vascular  Dorsalis pedis   pulses are not   palpable  bilaterally. Posterior tibial pulses are not palpable  B/L. Capillary return is within normal limits  bilaterally. Cold feet  Bilaterally. Absent digital hair.  Neurologic  Senn-Weinstein monofilament wire test within normal limits  bilaterally. Muscle power within normal limits bilaterally.  Nails Thick disfigured discolored nails with subungual debris  from hallux to fifth toes bilaterally. No evidence of bacterial infection or drainage bilaterally.  Orthopedic  No limitations of motion  feet .  No crepitus or effusions noted.  HAV with overlapping second toe  B/L.  Plantar flexed met second left foot.    Skin  normotropic skin with no porokeratosis noted bilaterally.  No signs of infections or ulcers noted.     Onychomycosis  Pain in right toes  Pain in left toes  Consent was obtained for treatment procedures.   Mechanical debridement of nails 1-5  bilaterally performed with a nail nipper.  Filed with dremel without incident.     Return office visit   3 months                   Told patient to return for periodic foot care and evaluation due to potential at risk complications.   Lamanda Rudder DPM  

## 2020-11-25 ENCOUNTER — Ambulatory Visit (INDEPENDENT_AMBULATORY_CARE_PROVIDER_SITE_OTHER): Payer: Medicare Other | Admitting: Ophthalmology

## 2020-11-25 ENCOUNTER — Encounter (INDEPENDENT_AMBULATORY_CARE_PROVIDER_SITE_OTHER): Payer: Self-pay | Admitting: Ophthalmology

## 2020-11-25 DIAGNOSIS — H35372 Puckering of macula, left eye: Secondary | ICD-10-CM

## 2020-11-25 DIAGNOSIS — H35352 Cystoid macular degeneration, left eye: Secondary | ICD-10-CM | POA: Diagnosis not present

## 2020-11-25 DIAGNOSIS — H353132 Nonexudative age-related macular degeneration, bilateral, intermediate dry stage: Secondary | ICD-10-CM

## 2020-11-25 MED ORDER — PREDNISOLONE ACETATE 1 % OP SUSP
1.0000 [drp] | Freq: Four times a day (QID) | OPHTHALMIC | 0 refills | Status: DC
Start: 2020-11-25 — End: 2021-02-15

## 2020-11-25 MED ORDER — OFLOXACIN 0.3 % OP SOLN: 1.0000 [drp] | Freq: Four times a day (QID) | OPHTHALMIC | 0 refills | Status: AC

## 2020-11-25 NOTE — Progress Notes (Signed)
11/25/2020     CHIEF COMPLAINT Patient presents for Retina Follow Up (5 Wk F/U OS, pre op OS//Pt c/o continued poor VA OS. No new symptoms reported OU.)   HISTORY OF PRESENT ILLNESS: Rebecca Norman is a 87 y.o. female who presents to the clinic today for:   HPI    Retina Follow Up    Patient presents with  Other.  In left eye.  This started 5 weeks ago.  Severity is mild.  Duration of 5 weeks.  Since onset it is stable. Additional comments: 5 Wk F/U OS, pre op OS  Pt c/o continued poor VA OS. No new symptoms reported OU.       Last edited by Rockie Neighbours, [place] on 11/25/2020 10:27 AM. (History)      Referring physician: [name], MD [address],  [place] [address]  HISTORICAL INFORMATION:   Selected notes from the MEDICAL RECORD NUMBER    Lab Results  Component Value Date   HGBA1C 5.6 05/09/2019     CURRENT MEDICATIONS: No current outpatient medications on file. (Ophthalmic Drugs)   No current facility-administered medications for this visit. (Ophthalmic Drugs)   Current Outpatient Medications (Other)  Medication Sig   acetaminophen (TYLENOL) 500 MG tablet 1 tablet as needed   apixaban (ELIQUIS) 5 MG TABS tablet Take 1 tablet (5 mg total) by mouth 2 (two) times daily.   Cholecalciferol (VITAMIN D) 50 MCG (2000 UT) tablet 1 tablet   ciprofloxacin (CIPRO) 250 MG tablet 1 tablet   famotidine (PEPCID) 40 MG tablet 1 tablet at bedtime.   furosemide (LASIX) 20 MG tablet Take 1 tablet (20 mg total) by mouth daily.   levothyroxine (SYNTHROID) 50 MCG tablet 1 tablet in the morning on an empty stomach   oxybutynin (DITROPAN) 5 MG tablet Take 5 mg by mouth at bedtime as needed for bladder spasms.    OXYGEN Inhale 4.5 L into the lungs continuous.    sertraline (ZOLOFT) 50 MG tablet Take 1 tablet by mouth daily.   UNABLE TO FIND 5 L continuous as directed   vitamin B-12 (CYANOCOBALAMIN) 1000 MCG tablet 1 tablet   No current  facility-administered medications for this visit. (Other)      REVIEW OF SYSTEMS:    ALLERGIES Allergies  Allergen Reactions   Lisinopril Cough and Other (See Comments)   Metronidazole Hives and Other (See Comments)    PAST MEDICAL HISTORY Past Medical History:  Diagnosis Date   DDD (degenerative disc disease), cervical    severe/  auto fusion c2-c5   Diverticulosis    Hemorrhoids    History of adenomatous polyp of colon    1995  adenomatous polyp/  1997 & 2003  hyperplastic polyp's   History of bladder cancer    s/p  TURBT's, urologist-  Dr [name]   History of cancer of ureter    Hypertension    Hypothyroid    Nocturia    Osteoarthritis    Osteopenia    Symptomatic bradycardia 05/06/2019   Past Surgical History:  Procedure Laterality Date   BACK SURGERY     CARDIOVERSION N/A 08/09/2017   Procedure: CARDIOVERSION;  Surgeon: Larey Dresser, MD;  Location: [facility] ENDOSCOPY;  Service: Cardiovascular;  Laterality: N/A;   CATARACT EXTRACTION W/ INTRAOCULAR LENS  IMPLANT, BILATERAL Bilateral    CYSTOSCOPY W/ RETROGRADES  10/03/2011   Procedure: CYSTOSCOPY WITH RETROGRADE PYELOGRAM;  Surgeon: Molli Hazard, MD;  Location[facility];  Service: Urology;;   CYSTOSCOPY W/ RETROGRADES Bilateral 06/17/2016   Procedure: CYSTOSCOPY WITH RETROGRADE PYELOGRAM;  Surgeon: Alexis Frock, MD;  Location: [facility];  Service: Urology;  Laterality: Bilateral;   CYSTOSCOPY WITH BIOPSY N/A 06/17/2016   Procedure: CYSTOSCOPY WITH BIOPSY AND FULGERATION;  Surgeon: Alexis Frock, MD;  Location: [facility];  Service: Urology;  Laterality: N/A;   LAMINECTOMY  2009   L3 - 4   ORIF ANKLE FRACTURE Left 08/28/2017   Procedure: OPEN REDUCTION INTERNAL FIXATION (ORIF) ANKLE FRACTURE;  Surgeon: Shona Needles, MD;  Location: [facility];  Service: Orthopedics;  Laterality: Left;   TRANSURETHRAL RESECTION OF BLADDER  x3 --  2009;  2010; 06-29-2010   TRANSURETHRAL RESECTION OF BLADDER TUMOR  10/03/2011   Procedure: TRANSURETHRAL RESECTION OF BLADDER TUMOR (TURBT);  Surgeon: Molli Hazard, MD;  Location: [facility];  Service: Urology;  Laterality: N/A;    FAMILY HISTORY Family History  Problem Relation Age of Onset   Breast cancer Sister    Stomach cancer Sister    Hernia Sister 81       HERNIA SURGERY COMPLICATIONS   Colon cancer Neg Hx     SOCIAL HISTORY Social History   Tobacco Use   Smoking status: Former Smoker    Years: 10.00    Types: Cigarettes    Quit date: 09/25/2005    Years since quitting: 15.1   Smokeless tobacco: Never Used  Vaping Use   Vaping Use: Never used  Substance Use Topics   Alcohol use: Yes   Drug use: No         OPHTHALMIC EXAM:  Base Eye Exam    Visual Acuity (ETDRS)      Right Left   Dist cc 20/40 -1 20/200   Dist ph cc NI NI   Correction: Glasses       Tonometry (Tonopen, 10:28 AM)      Right Left   Pressure 12 12       Dilation    Left eye: 1.0% Mydriacyl, 2.5% Phenylephrine @ 10:32 AM        Slit Lamp and Fundus Exam    Slit Lamp Exam      Right Left   Lids/Lashes Normal Normal   Conjunctiva/Sclera White and quiet White and quiet   Cornea Clear Clear   Anterior Chamber Deep and quiet Deep and quiet   Iris Round and reactive Round and reactive   Lens Centered posterior chamber intraocular lens Centered posterior chamber intraocular lens   Anterior Vitreous Normal Normal       Fundus Exam      Right Left   Posterior Vitreous  Normal   Disc  Normal   C/D Ratio  0.1   Macula  Retinal pigment epithelial mottling, Early age related macular degeneration, Epiretinal membrane severe topographic distortion, no atrophy to the RPE   Vessels  Normal   Periphery  Normal          IMAGING AND PROCEDURES  Imaging and Procedures for 11/25/20  OCT, Retina - OU - Both Eyes       Right Eye Quality was good. Scan  locations included subfoveal. Central Foveal Thickness: 264. Progression has been stable. Findings include abnormal foveal contour, retinal drusen , no SRF, no IRF.   Left Eye Quality was good. Scan locations included subfoveal. Central Foveal Thickness: 550. Progression has worsened. Findings include retinal drusen , abnormal foveal contour, cystoid macular edema, epiretinal membrane.  Notes Severe topographic distortion left eye on the basis of epiretinal membrane And secondary cystoid macular edema.  Outer retinal drusen are present but no signs of CNVM OS or OD.                  ASSESSMENT/PLAN:  Cystoid macular edema of left eye Persistent continue vision loss secondary to ERM, need surgical intervention  Left epiretinal membrane Severe epiretinal membrane, first recognized in 2014 with progressive vision loss 2015 with recommended for surgery.  Now with secondary CME and further vision loss in left eye for surgical intervention to allow stabilization and likely improvement in acuity over time.      ICD-10-CM   1. Left epiretinal membrane  H35.372 OCT, Retina - OU - Both Eyes  2. Cystoid macular edema of left eye  H35.352   3. Intermediate stage nonexudative age-related macular degeneration of both eyes  H35.3132     1.  Risk benefits reviewed.  2.  3.  Ophthalmic Meds Ordered this visit:  No orders of the defined types were placed in this encounter.      Return ,,, [facility], for Scheduled vitrectomy membrane peel, [address], OS.  Patient Instructions  1.  GIVE PREOPERATIVE EYE MEDICATION TO PATIENT TO TAKE HOME, AND DO NOT USE UNTIL POSTOPERATIVE VISIT THE NEXT DAY IN [facility] OFFICE UNLESS INSTRUCTED BY PHYSICIAN TO START THE NEXT DAY.   POSITIONS:  (if gas bubble placed into the eye)  A.  Do not sleep or rest on back  B.  Do not travel by airplane, to the mountains, or elevation above 2000 feet   The patient understands the risks  of anesthesia including but not limited to the rare occurrence of death. The patient also understands risks and benefits of the planned surgical procedure include but are not limited to hemorrhage, infection, scarring, loss of vision, progression of disease despite intervention, and need for another procedure.     Explained the diagnoses, plan, and follow up with the patient and they expressed understanding.  Patient expressed understanding of the importance of proper follow up care.   Clent Demark Annamay Laymon M.D. Diseases & Surgery of the Retina and Vitreous Retina & Diabetic [place] 11/25/20     Abbreviations: M myopia (nearsighted); A astigmatism; H hyperopia (farsighted); P presbyopia; Mrx spectacle prescription;  CTL contact lenses; OD right eye; OS left eye; OU both eyes  XT exotropia; ET esotropia; PEK punctate epithelial keratitis; PEE punctate epithelial erosions; DES dry eye syndrome; MGD meibomian gland dysfunction; ATs artificial tears; PFAT's preservative free artificial tears; [place] nuclear sclerotic cataract; PSC posterior subcapsular cataract; ERM epi-retinal membrane; PVD posterior vitreous detachment; RD retinal detachment; DM diabetes mellitus; DR diabetic retinopathy; NPDR non-proliferative diabetic retinopathy; PDR proliferative diabetic retinopathy; CSME clinically significant macular edema; DME diabetic macular edema; dbh dot blot hemorrhages; CWS cotton wool spot; POAG primary open angle glaucoma; C/D cup-to-disc ratio; HVF humphrey visual field; GVF goldmann visual field; OCT optical coherence tomography; IOP intraocular pressure; BRVO Branch retinal vein occlusion; CRVO central retinal vein occlusion; CRAO central retinal artery occlusion; BRAO branch retinal artery occlusion; RT retinal tear; SB scleral buckle; PPV pars plana vitrectomy; VH Vitreous hemorrhage; PRP panretinal laser photocoagulation; IVK intravitreal kenalog; VMT vitreomacular traction; MH Macular hole;  NVD  neovascularization of the disc; NVE neovascularization elsewhere; AREDS age related eye disease study; ARMD age related macular degeneration; POAG primary open angle glaucoma; EBMD epithelial/anterior basement membrane dystrophy; ACIOL anterior chamber intraocular lens; IOL intraocular  lens; PCIOL posterior chamber intraocular lens; Phaco/IOL phacoemulsification with intraocular lens placement; [place] photorefractive keratectomy; LASIK laser assisted in situ keratomileusis; HTN hypertension; DM diabetes mellitus; COPD chronic obstructive pulmonary disease

## 2020-11-25 NOTE — Patient Instructions (Signed)
1.  GIVE PREOPERATIVE EYE MEDICATION TO PATIENT TO TAKE HOME, AND DO NOT USE UNTIL POSTOPERATIVE VISIT THE NEXT DAY IN [facility] OFFICE UNLESS INSTRUCTED BY PHYSICIAN TO START THE NEXT DAY.   POSITIONS:  (if gas bubble placed into the eye)  A.  Do not sleep or rest on back  B.  Do not travel by airplane, to the mountains, or elevation above 2000 feet   The patient understands the risks of anesthesia including but not limited to the rare occurrence of death. The patient also understands risks and benefits of the planned surgical procedure include but are not limited to hemorrhage, infection, scarring, loss of vision, progression of disease despite intervention, and need for another procedure.

## 2020-11-25 NOTE — Assessment & Plan Note (Addendum)
Persistent continue vision loss secondary to ERM, need surgical intervention

## 2020-11-25 NOTE — Assessment & Plan Note (Signed)
Severe epiretinal membrane, first recognized in 2014 with progressive vision loss 2015 with recommended for surgery.  Now with secondary CME and further vision loss in left eye for surgical intervention to allow stabilization and likely improvement in acuity over time.

## 2020-12-02 ENCOUNTER — Encounter (AMBULATORY_SURGERY_CENTER): Payer: Medicare Other | Admitting: Ophthalmology

## 2020-12-02 DIAGNOSIS — H35372 Puckering of macula, left eye: Secondary | ICD-10-CM | POA: Diagnosis not present

## 2020-12-03 ENCOUNTER — Other Ambulatory Visit: Payer: Self-pay

## 2020-12-03 ENCOUNTER — Ambulatory Visit (INDEPENDENT_AMBULATORY_CARE_PROVIDER_SITE_OTHER): Payer: Medicare Other | Admitting: Ophthalmology

## 2020-12-03 ENCOUNTER — Encounter (INDEPENDENT_AMBULATORY_CARE_PROVIDER_SITE_OTHER): Payer: Self-pay | Admitting: Ophthalmology

## 2020-12-03 DIAGNOSIS — H35372 Puckering of macula, left eye: Secondary | ICD-10-CM

## 2020-12-03 NOTE — Patient Instructions (Signed)
Patient instructed to return to using topical medications left eye today

## 2020-12-03 NOTE — Assessment & Plan Note (Signed)
Stop day #1 looks great OS  No lifting and bending for 1 week. No water in the eye for 10 days. Do not rub the eye. Wear shield at night for 1-3 days.  Wear your CPAP as normal, if instructed by your doctor.  Continue your topical medications for a total of 3 weeks.  Do not refill your postoperative medications unless instructed  .Ofloxacin  4 times daily to the operative eye  Prednisolone acetate 1 drop to the operative eye 4 times daily  Patient instructed not to refill the medications and use them for maximum of 3 weeks.  Patient instructed do not rub the eye.  Patient has the option to use the patch at night.

## 2020-12-03 NOTE — Progress Notes (Signed)
12/03/2020     CHIEF COMPLAINT Patient presents for Post-op Follow-up (1 Day s/p vitrectomy and membrane peel OS/Pt denies ocular pain. Pt states she slept well. No complaints.)   HISTORY OF PRESENT ILLNESS: Rebecca Norman is a 87 y.o. female who presents to the clinic today for:   HPI    Post-op Follow-up    In left eye.  Discomfort includes none.  Vision is stable.  I, the attending physician,  performed the HPI with the patient and updated documentation appropriately. Additional comments: 1 Day s/p vitrectomy and membrane peel OS Pt denies ocular pain. Pt states she slept well. No complaints.       Last edited by [name] on 12/03/2020 10:28 AM. (History)      Referring physician: [name], MD [address],  [place] [address]  HISTORICAL INFORMATION:   Selected notes from the MEDICAL RECORD NUMBER    Lab Results  Component Value Date   HGBA1C 5.6 05/09/2019     CURRENT MEDICATIONS: Current Outpatient Medications (Ophthalmic Drugs)  Medication Sig  . ofloxacin (OCUFLOX) 0.3 % ophthalmic solution Place 1 drop into the left eye 4 (four) times daily for 21 days.  . prednisoLONE acetate (PRED FORTE) 1 % ophthalmic suspension Place 1 drop into the left eye 4 (four) times daily.   No current facility-administered medications for this visit. (Ophthalmic Drugs)   Current Outpatient Medications (Other)  Medication Sig  . acetaminophen (TYLENOL) 500 MG tablet 1 tablet as needed  . apixaban (ELIQUIS) 5 MG TABS tablet Take 1 tablet (5 mg total) by mouth 2 (two) times daily.  . Cholecalciferol (VITAMIN D) 50 MCG (2000 UT) tablet 1 tablet  . ciprofloxacin (CIPRO) 250 MG tablet 1 tablet  . famotidine (PEPCID) 40 MG tablet 1 tablet at bedtime.  . furosemide (LASIX) 20 MG tablet Take 1 tablet (20 mg total) by mouth daily.  Marland Kitchen levothyroxine (SYNTHROID) 50 MCG tablet 1 tablet in the morning on an empty stomach  . oxybutynin (DITROPAN) 5 MG  tablet Take 5 mg by mouth at bedtime as needed for bladder spasms.   . OXYGEN Inhale 4.5 L into the lungs continuous.   . sertraline (ZOLOFT) 50 MG tablet Take 1 tablet by mouth daily.  Marland Kitchen UNABLE TO FIND 5 L continuous as directed  . vitamin B-12 (CYANOCOBALAMIN) 1000 MCG tablet 1 tablet   No current facility-administered medications for this visit. (Other)      REVIEW OF SYSTEMS:    ALLERGIES Allergies  Allergen Reactions  . Lisinopril Cough and Other (See Comments)  . Metronidazole Hives and Other (See Comments)    PAST MEDICAL HISTORY Past Medical History:  Diagnosis Date  . DDD (degenerative disc disease), cervical    severe/  auto fusion c2-c5  . Diverticulosis   . Hemorrhoids   . History of adenomatous polyp of colon    1995  adenomatous polyp/  1997 & 2003  hyperplastic polyp's  . History of bladder cancer    s/p  TURBT's, urologist-  Dr [name]  . History of cancer of ureter   . Hypertension   . Hypothyroid   . Nocturia   . Osteoarthritis   . Osteopenia   . Symptomatic bradycardia 05/06/2019   Past Surgical History:  Procedure Laterality Date  . BACK SURGERY    . CARDIOVERSION N/A 08/09/2017   Procedure: CARDIOVERSION;  Surgeon: Larey Dresser, MD;  Location: [facility] ENDOSCOPY;  Service: Cardiovascular;  Laterality: N/A;  .  CATARACT EXTRACTION W/ INTRAOCULAR LENS  IMPLANT, BILATERAL Bilateral   . CYSTOSCOPY W/ RETROGRADES  10/03/2011   Procedure: CYSTOSCOPY WITH RETROGRADE PYELOGRAM;  Surgeon: Molli Hazard, MD;  Location: [facility];  Service: Urology;;  . Consuela Mimes W/ RETROGRADES Bilateral 06/17/2016   Procedure: CYSTOSCOPY WITH RETROGRADE PYELOGRAM;  Surgeon: Alexis Frock, MD;  Location: [facility];  Service: Urology;  Laterality: Bilateral;  . CYSTOSCOPY WITH BIOPSY N/A 06/17/2016   Procedure: CYSTOSCOPY WITH BIOPSY AND FULGERATION;  Surgeon: Alexis Frock, MD;  Location: [facility];  Service: Urology;   Laterality: N/A;  . LAMINECTOMY  2009   L3 - 4  . ORIF ANKLE FRACTURE Left 08/28/2017   Procedure: OPEN REDUCTION INTERNAL FIXATION (ORIF) ANKLE FRACTURE;  Surgeon: Shona Needles, MD;  Location: [facility];  Service: Orthopedics;  Laterality: Left;  . TRANSURETHRAL RESECTION OF BLADDER  x3 --  2009; 2010; 06-29-2010  . TRANSURETHRAL RESECTION OF BLADDER TUMOR  10/03/2011   Procedure: TRANSURETHRAL RESECTION OF BLADDER TUMOR (TURBT);  Surgeon: Molli Hazard, MD;  Location: [facility];  Service: Urology;  Laterality: N/A;    FAMILY HISTORY Family History  Problem Relation Age of Onset  . Breast cancer Sister   . Stomach cancer Sister   . Hernia Sister 28       HERNIA SURGERY COMPLICATIONS  . Colon cancer Neg Hx     SOCIAL HISTORY Social History   Tobacco Use  . Smoking status: Former Smoker    Years: 10.00    Types: Cigarettes    Quit date: 09/25/2005    Years since quitting: 15.2  . Smokeless tobacco: Never Used  Vaping Use  . Vaping Use: Never used  Substance Use Topics  . Alcohol use: Yes  . Drug use: No         OPHTHALMIC EXAM: Base Eye Exam    Visual Acuity (Snellen - Linear)      Right Left   Dist cc 20/40 -1 E Card @ 5'   Dist ph cc NI NI   Correction: Glasses       Tonometry (Tonopen, 10:34 AM)      Right Left   Pressure 11 13       Pupils      Dark Light Shape React   Right       Left 9 9 Round Dilated       Neuro/Psych    Oriented x3: Yes   Mood/Affect: Normal        Slit Lamp and Fundus Exam    Slit Lamp Exam      Right Left   Lids/Lashes Normal Normal   Conjunctiva/Sclera White and quiet White and quiet   Cornea Clear Clear   Anterior Chamber Deep and quiet Deep and quiet   Iris Round and reactive Round and reactive   Lens Centered posterior chamber intraocular lens Centered posterior chamber intraocular lens   Anterior Vitreous Normal Normal       Fundus Exam      Right Left   Posterior Vitreous  Clear,  avitric   Disc  Normal   C/D Ratio  0.1   Macula  Retinal pigment epithelial mottling, 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 No water in the eye for 10 days. Do not rub the eye. Wear shield at night for 1-3 days.  Wear your CPAP as normal, if instructed by your doctor.  Continue your topical medications for a total of 3 weeks.  Do not refill your postoperative medications unless instructed  .Ofloxacin  4 times daily to the operative eye  Prednisolone acetate 1 drop to the operative eye 4 times daily  Patient instructed not to refill the medications and use them for maximum of 3 weeks.  Patient instructed do not rub the eye.  Patient has the option to use the patch at night.      ICD-10-CM   1. Left epiretinal membrane  H35.372     1.  2.  3.  Ophthalmic Meds Ordered this visit:  No orders of the defined types were placed in this encounter.      Return in about 1 week (around 12/10/2020) for dilate, OS, POST OP, OCT.  There are no Patient Instructions on file for this visit.   Explained the diagnoses, plan, and follow up with the patient and they expressed understanding.  Patient expressed understanding of the importance of proper follow up care.   Clent Demark Augusto Deckman M.D. Diseases & Surgery of the Retina and Vitreous Retina & Diabetic [place] 12/03/20     Abbreviations: M myopia (nearsighted); A astigmatism; H hyperopia (farsighted); P presbyopia; Mrx spectacle prescription;  CTL contact lenses; OD right eye; OS left eye; OU both eyes  XT exotropia; ET esotropia; PEK punctate epithelial keratitis; PEE punctate epithelial erosions; DES dry eye syndrome; MGD meibomian gland dysfunction; ATs artificial tears;  PFAT's preservative free artificial tears; [place] nuclear sclerotic cataract; PSC posterior subcapsular cataract; ERM epi-retinal membrane; PVD posterior vitreous detachment; RD retinal detachment; DM diabetes mellitus; DR diabetic retinopathy; NPDR non-proliferative diabetic retinopathy; PDR proliferative diabetic retinopathy; CSME clinically significant macular edema; DME diabetic macular edema; dbh dot blot hemorrhages; CWS cotton wool spot; POAG primary open angle glaucoma; C/D cup-to-disc ratio; HVF humphrey visual field; GVF goldmann visual field; OCT optical coherence tomography; IOP intraocular pressure; BRVO Branch retinal vein occlusion; CRVO central retinal vein occlusion; CRAO central retinal artery occlusion; BRAO branch retinal artery occlusion; RT retinal tear; SB scleral buckle; PPV pars plana vitrectomy; VH Vitreous hemorrhage; PRP panretinal laser photocoagulation; IVK intravitreal kenalog; VMT vitreomacular traction; MH Macular hole;  NVD neovascularization of the disc; NVE neovascularization elsewhere; AREDS age related eye disease study; ARMD age related macular degeneration; POAG primary open angle glaucoma; EBMD epithelial/anterior basement membrane dystrophy; ACIOL anterior chamber intraocular lens; IOL intraocular lens; PCIOL posterior chamber intraocular lens; Phaco/IOL phacoemulsification with intraocular lens placement; [place] photorefractive keratectomy; LASIK laser assisted in situ keratomileusis; HTN hypertension; DM diabetes mellitus; COPD chronic obstructive pulmonary disease

## 2020-12-08 ENCOUNTER — Ambulatory Visit (INDEPENDENT_AMBULATORY_CARE_PROVIDER_SITE_OTHER): Payer: Medicare Other | Admitting: Ophthalmology

## 2020-12-08 ENCOUNTER — Encounter (INDEPENDENT_AMBULATORY_CARE_PROVIDER_SITE_OTHER): Payer: Self-pay | Admitting: Ophthalmology

## 2020-12-08 ENCOUNTER — Other Ambulatory Visit: Payer: Self-pay

## 2020-12-08 ENCOUNTER — Encounter (INDEPENDENT_AMBULATORY_CARE_PROVIDER_SITE_OTHER): Payer: Medicare Other | Admitting: Ophthalmology

## 2020-12-08 DIAGNOSIS — Z09 Encounter for follow-up examination after completed treatment for conditions other than malignant neoplasm: Secondary | ICD-10-CM | POA: Insufficient documentation

## 2020-12-08 DIAGNOSIS — H35372 Puckering of macula, left eye: Secondary | ICD-10-CM | POA: Diagnosis not present

## 2020-12-08 NOTE — Assessment & Plan Note (Signed)
Ofloxacin  4 times daily to the operative eye  Prednisolone acetate 1 drop to the operative eye 4 times daily  Patient instructed not to refill the medications and use them for maximum of 3 weeks.  Patient instructed do not rub the eye.  Patient has the option to use the patch at night.   Patient instructed to use her topical medications in the left eye for only 2 more weeks but do not refill the medication bottles should they be completed before that time.

## 2020-12-08 NOTE — Progress Notes (Signed)
12/08/2020     CHIEF COMPLAINT Patient presents for Post-op Follow-up (1 Week s\p vitrectomy and membrane peel OS. OCT/Pt denies pain or discomfort. Pt states OS is doing well. Using gtts as directed.)   HISTORY OF PRESENT ILLNESS: Rebecca Norman is a 87 y.o. female who presents to the clinic today for:   HPI    Post-op Follow-up    In left eye.  Discomfort includes none.  Vision is stable.  I, the attending physician,  performed the HPI with the patient and updated documentation appropriately. Additional comments: 1 Week s\p vitrectomy and membrane peel OS. OCT Pt denies pain or discomfort. Pt states OS is doing well. Using gtts as directed.       Last edited by [name] on 12/08/2020  2:13 PM. (History)      Referring physician: [name], MD [address],  [place] [address]  HISTORICAL INFORMATION:   Selected notes from the MEDICAL RECORD NUMBER    Lab Results  Component Value Date   HGBA1C 5.6 05/09/2019     CURRENT MEDICATIONS: Current Outpatient Medications (Ophthalmic Drugs)  Medication Sig  . ofloxacin (OCUFLOX) 0.3 % ophthalmic solution Place 1 drop into the left eye 4 (four) times daily for 21 days.  . prednisoLONE acetate (PRED FORTE) 1 % ophthalmic suspension Place 1 drop into the left eye 4 (four) times daily.   No current facility-administered medications for this visit. (Ophthalmic Drugs)   Current Outpatient Medications (Other)  Medication Sig  . acetaminophen (TYLENOL) 500 MG tablet 1 tablet as needed  . apixaban (ELIQUIS) 5 MG TABS tablet Take 1 tablet (5 mg total) by mouth 2 (two) times daily.  . Cholecalciferol (VITAMIN D) 50 MCG (2000 UT) tablet 1 tablet  . ciprofloxacin (CIPRO) 250 MG tablet 1 tablet  . famotidine (PEPCID) 40 MG tablet 1 tablet at bedtime.  . furosemide (LASIX) 20 MG tablet Take 1 tablet (20 mg total) by mouth daily.  Marland Kitchen levothyroxine (SYNTHROID) 50 MCG tablet 1 tablet in the morning on an  empty stomach  . oxybutynin (DITROPAN) 5 MG tablet Take 5 mg by mouth at bedtime as needed for bladder spasms.   . OXYGEN Inhale 4.5 L into the lungs continuous.   . sertraline (ZOLOFT) 50 MG tablet Take 1 tablet by mouth daily.  Marland Kitchen UNABLE TO FIND 5 L continuous as directed  . vitamin B-12 (CYANOCOBALAMIN) 1000 MCG tablet 1 tablet   No current facility-administered medications for this visit. (Other)      REVIEW OF SYSTEMS:    ALLERGIES Allergies  Allergen Reactions  . Lisinopril Cough and Other (See Comments)  . Metronidazole Hives and Other (See Comments)    PAST MEDICAL HISTORY Past Medical History:  Diagnosis Date  . DDD (degenerative disc disease), cervical    severe/  auto fusion c2-c5  . Diverticulosis   . Hemorrhoids   . History of adenomatous polyp of colon    1995  adenomatous polyp/  1997 & 2003  hyperplastic polyp's  . History of bladder cancer    s/p  TURBT's, urologist-  Dr [name]  . History of cancer of ureter   . Hypertension   . Hypothyroid   . Nocturia   . Osteoarthritis   . Osteopenia   . Symptomatic bradycardia 05/06/2019   Past Surgical History:  Procedure Laterality Date  . BACK SURGERY    . CARDIOVERSION N/A 08/09/2017   Procedure: CARDIOVERSION;  Surgeon: Larey Dresser,  MD;  Location: [facility];  Service: Cardiovascular;  Laterality: N/A;  . CATARACT EXTRACTION W/ INTRAOCULAR LENS  IMPLANT, BILATERAL Bilateral   . CYSTOSCOPY W/ RETROGRADES  10/03/2011   Procedure: CYSTOSCOPY WITH RETROGRADE PYELOGRAM;  Surgeon: Molli Hazard, MD;  Location: [facility];  Service: Urology;;  . Consuela Mimes W/ RETROGRADES Bilateral 06/17/2016   Procedure: CYSTOSCOPY WITH RETROGRADE PYELOGRAM;  Surgeon: Alexis Frock, MD;  Location: [facility];  Service: Urology;  Laterality: Bilateral;  . CYSTOSCOPY WITH BIOPSY N/A 06/17/2016   Procedure: CYSTOSCOPY WITH BIOPSY AND FULGERATION;  Surgeon: Alexis Frock, MD;  Location:  [facility];  Service: Urology;  Laterality: N/A;  . LAMINECTOMY  2009   L3 - 4  . ORIF ANKLE FRACTURE Left 08/28/2017   Procedure: OPEN REDUCTION INTERNAL FIXATION (ORIF) ANKLE FRACTURE;  Surgeon: Shona Needles, MD;  Location: [facility];  Service: Orthopedics;  Laterality: Left;  . TRANSURETHRAL RESECTION OF BLADDER  x3 --  2009; 2010; 06-29-2010  . TRANSURETHRAL RESECTION OF BLADDER TUMOR  10/03/2011   Procedure: TRANSURETHRAL RESECTION OF BLADDER TUMOR (TURBT);  Surgeon: Molli Hazard, MD;  Location: [facility];  Service: Urology;  Laterality: N/A;    FAMILY HISTORY Family History  Problem Relation Age of Onset  . Breast cancer Sister   . Stomach cancer Sister   . Hernia Sister 1       HERNIA SURGERY COMPLICATIONS  . Colon cancer Neg Hx     SOCIAL HISTORY Social History   Tobacco Use  . Smoking status: Former Smoker    Years: 10.00    Types: Cigarettes    Quit date: 09/25/2005    Years since quitting: 15.2  . Smokeless tobacco: Never Used  Vaping Use  . Vaping Use: Never used  Substance Use Topics  . Alcohol use: Yes  . Drug use: No         OPHTHALMIC EXAM: Base Eye Exam    Visual Acuity (Snellen - Linear)      Right Left   Dist cc 20/40 -1 CF @ 3'   Dist ph cc NI NI   Correction: Glasses       Tonometry (Tonopen, 2:17 PM)      Right Left   Pressure 13 11       Pupils      Pupils Dark Light Shape React APD   Right PERRL 4 3 Round Slow None   Left PERRL 4 3 Round Slow None       Neuro/Psych    Oriented x3: Yes   Mood/Affect: Normal       Dilation    Left eye: 1.0% Mydriacyl, 2.5% Phenylephrine @ 2:17 PM        Slit Lamp and Fundus Exam    Slit Lamp Exam      Right Left   Lids/Lashes Normal Normal   Conjunctiva/Sclera White and quiet White and quiet   Cornea Clear Clear   Anterior Chamber Deep and quiet Deep and quiet   Iris Round and reactive Round and reactive   Lens Centered posterior chamber  intraocular lens Centered posterior chamber intraocular lens   Anterior Vitreous Normal Normal       Fundus Exam      Right Left   Posterior Vitreous  Clear, avitric   Disc  Normal   C/D Ratio  0.1   Macula  Retinal pigment epithelial mottling, 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 Scan locations included subfoveal. Central Foveal Thickness: 264. Progression has been stable. Findings include abnormal foveal contour, retinal drusen , no SRF, no IRF.   Left Eye Quality was good. Scan locations included subfoveal. Central Foveal Thickness: 324. Progression has worsened. Findings include retinal drusen , abnormal foveal contour, cystoid macular edema, epiretinal membrane.   Notes Much improved macular thickening, much less CME, he 1 week post vitrectomy membrane peel OS  Outer retinal drusen are present but no signs of CNVM OS or OD.                  ASSESSMENT/PLAN:  Left epiretinal membrane 1 week postop vitrectomy membrane peel left eye, macular topography vastly improved much less CME, visual acuity now has a chance to begin to improve  Postoperative follow-up Ofloxacin  4 times daily to the operative eye  Prednisolone acetate 1 drop to the operative eye 4 times daily  Patient instructed not to refill the medications and use them for maximum of 3 weeks.  Patient instructed do not rub the eye.  Patient has the option to use the patch at night.   Patient instructed to use her topical medications in the left eye for only 2 more weeks but do not refill the medication bottles should they be completed before that time.      ICD-10-CM   1. Left epiretinal membrane  H35.372 OCT, Retina - OU - Both Eyes  2. Postoperative follow-up  Z09     1.  OS topographically  looks great 1 week post vitrectomy membrane peel.  Resolved CME which was secondary to the severe ERM  2.  Complete topical medications left eye over the next 2 weeks do not refill them if they complete before that time do not continue to use them or save them  3.  Ophthalmic Meds Ordered this visit:  No orders of the defined types were placed in this encounter.      Return in about 8 weeks (around 02/02/2021) for dilate, OS, OCT.  There are no Patient Instructions on file for this visit.   Explained the diagnoses, plan, and follow up with the patient and they expressed understanding.  Patient expressed understanding of the importance of proper follow up care.   Clent Demark Arhianna Ebey M.D. Diseases & Surgery of the Retina and Vitreous Retina & Diabetic [place] 12/08/20     Abbreviations: M myopia (nearsighted); A astigmatism; H hyperopia (farsighted); P presbyopia; Mrx spectacle prescription;  CTL contact lenses; OD right eye; OS left eye; OU both eyes  XT exotropia; ET esotropia; PEK punctate epithelial keratitis; PEE punctate epithelial erosions; DES dry eye syndrome; MGD meibomian gland dysfunction; ATs artificial tears; PFAT's preservative free artificial tears; [place] nuclear sclerotic cataract; PSC posterior subcapsular cataract; ERM epi-retinal membrane; PVD posterior vitreous detachment; RD retinal detachment; DM diabetes mellitus; DR diabetic retinopathy; NPDR non-proliferative diabetic retinopathy; PDR proliferative diabetic retinopathy; CSME clinically significant macular edema; DME diabetic macular edema; dbh dot blot hemorrhages; CWS cotton wool spot; POAG primary open angle glaucoma; C/D cup-to-disc ratio; HVF humphrey visual field; GVF goldmann visual field; OCT optical coherence tomography; IOP intraocular pressure; BRVO Branch retinal vein occlusion; CRVO central retinal vein occlusion; CRAO central retinal artery occlusion; BRAO branch  retinal artery occlusion; RT retinal tear;  SB scleral buckle; PPV pars plana vitrectomy; VH Vitreous hemorrhage; PRP panretinal laser photocoagulation; IVK intravitreal kenalog; VMT vitreomacular traction; MH Macular hole;  NVD neovascularization of the disc; NVE neovascularization elsewhere; AREDS age related eye disease study; ARMD age related macular degeneration; POAG primary open angle glaucoma; EBMD epithelial/anterior basement membrane dystrophy; ACIOL anterior chamber intraocular lens; IOL intraocular lens; PCIOL posterior chamber intraocular lens; Phaco/IOL phacoemulsification with intraocular lens placement; [place] photorefractive keratectomy; LASIK laser assisted in situ keratomileusis; HTN hypertension; DM diabetes mellitus; COPD chronic obstructive pulmonary disease

## 2020-12-08 NOTE — Assessment & Plan Note (Signed)
1 week postop vitrectomy membrane peel left eye, macular topography vastly improved much less CME, visual acuity now has a chance to begin to improve

## 2020-12-09 ENCOUNTER — Encounter (INDEPENDENT_AMBULATORY_CARE_PROVIDER_SITE_OTHER): Payer: Medicare Other | Admitting: Ophthalmology

## 2020-12-30 DIAGNOSIS — Z23 Encounter for immunization: Secondary | ICD-10-CM | POA: Diagnosis not present

## 2021-01-11 DIAGNOSIS — Z0000 Encounter for general adult medical examination without abnormal findings: Secondary | ICD-10-CM | POA: Diagnosis not present

## 2021-01-11 DIAGNOSIS — I110 Hypertensive heart disease with heart failure: Secondary | ICD-10-CM | POA: Diagnosis not present

## 2021-01-11 DIAGNOSIS — I5030 Unspecified diastolic (congestive) heart failure: Secondary | ICD-10-CM | POA: Diagnosis not present

## 2021-01-11 DIAGNOSIS — F322 Major depressive disorder, single episode, severe without psychotic features: Secondary | ICD-10-CM | POA: Diagnosis not present

## 2021-01-11 DIAGNOSIS — M150 Primary generalized (osteo)arthritis: Secondary | ICD-10-CM | POA: Diagnosis not present

## 2021-01-11 DIAGNOSIS — D519 Vitamin B12 deficiency anemia, unspecified: Secondary | ICD-10-CM | POA: Diagnosis not present

## 2021-01-11 DIAGNOSIS — E039 Hypothyroidism, unspecified: Secondary | ICD-10-CM | POA: Diagnosis not present

## 2021-01-11 DIAGNOSIS — L219 Seborrheic dermatitis, unspecified: Secondary | ICD-10-CM | POA: Diagnosis not present

## 2021-01-11 DIAGNOSIS — I350 Nonrheumatic aortic (valve) stenosis: Secondary | ICD-10-CM | POA: Diagnosis not present

## 2021-01-11 DIAGNOSIS — C679 Malignant neoplasm of bladder, unspecified: Secondary | ICD-10-CM | POA: Diagnosis not present

## 2021-01-11 DIAGNOSIS — I4891 Unspecified atrial fibrillation: Secondary | ICD-10-CM | POA: Diagnosis not present

## 2021-01-11 DIAGNOSIS — D6869 Other thrombophilia: Secondary | ICD-10-CM | POA: Diagnosis not present

## 2021-01-11 DIAGNOSIS — D649 Anemia, unspecified: Secondary | ICD-10-CM | POA: Diagnosis not present

## 2021-01-11 DIAGNOSIS — R270 Ataxia, unspecified: Secondary | ICD-10-CM | POA: Diagnosis not present

## 2021-01-12 DIAGNOSIS — D6859 Other primary thrombophilia: Secondary | ICD-10-CM | POA: Diagnosis not present

## 2021-01-12 DIAGNOSIS — M150 Primary generalized (osteo)arthritis: Secondary | ICD-10-CM | POA: Diagnosis not present

## 2021-01-12 DIAGNOSIS — H3530 Unspecified macular degeneration: Secondary | ICD-10-CM | POA: Diagnosis not present

## 2021-01-12 DIAGNOSIS — I350 Nonrheumatic aortic (valve) stenosis: Secondary | ICD-10-CM | POA: Diagnosis not present

## 2021-01-12 DIAGNOSIS — D519 Vitamin B12 deficiency anemia, unspecified: Secondary | ICD-10-CM | POA: Diagnosis not present

## 2021-01-12 DIAGNOSIS — D126 Benign neoplasm of colon, unspecified: Secondary | ICD-10-CM | POA: Diagnosis not present

## 2021-01-12 DIAGNOSIS — G43909 Migraine, unspecified, not intractable, without status migrainosus: Secondary | ICD-10-CM | POA: Diagnosis not present

## 2021-01-12 DIAGNOSIS — E039 Hypothyroidism, unspecified: Secondary | ICD-10-CM | POA: Diagnosis not present

## 2021-01-12 DIAGNOSIS — M8580 Other specified disorders of bone density and structure, unspecified site: Secondary | ICD-10-CM | POA: Diagnosis not present

## 2021-01-12 DIAGNOSIS — F324 Major depressive disorder, single episode, in partial remission: Secondary | ICD-10-CM | POA: Diagnosis not present

## 2021-01-12 DIAGNOSIS — Z7901 Long term (current) use of anticoagulants: Secondary | ICD-10-CM | POA: Diagnosis not present

## 2021-01-12 DIAGNOSIS — Z9981 Dependence on supplemental oxygen: Secondary | ICD-10-CM | POA: Diagnosis not present

## 2021-01-12 DIAGNOSIS — N3281 Overactive bladder: Secondary | ICD-10-CM | POA: Diagnosis not present

## 2021-01-12 DIAGNOSIS — I4891 Unspecified atrial fibrillation: Secondary | ICD-10-CM | POA: Diagnosis not present

## 2021-01-12 DIAGNOSIS — Z8673 Personal history of transient ischemic attack (TIA), and cerebral infarction without residual deficits: Secondary | ICD-10-CM | POA: Diagnosis not present

## 2021-01-12 DIAGNOSIS — K449 Diaphragmatic hernia without obstruction or gangrene: Secondary | ICD-10-CM | POA: Diagnosis not present

## 2021-01-12 DIAGNOSIS — R270 Ataxia, unspecified: Secondary | ICD-10-CM | POA: Diagnosis not present

## 2021-01-12 DIAGNOSIS — Z87891 Personal history of nicotine dependence: Secondary | ICD-10-CM | POA: Diagnosis not present

## 2021-01-12 DIAGNOSIS — I5030 Unspecified diastolic (congestive) heart failure: Secondary | ICD-10-CM | POA: Diagnosis not present

## 2021-01-12 DIAGNOSIS — C679 Malignant neoplasm of bladder, unspecified: Secondary | ICD-10-CM | POA: Diagnosis not present

## 2021-01-12 DIAGNOSIS — I110 Hypertensive heart disease with heart failure: Secondary | ICD-10-CM | POA: Diagnosis not present

## 2021-01-12 DIAGNOSIS — Z9181 History of falling: Secondary | ICD-10-CM | POA: Diagnosis not present

## 2021-01-18 DIAGNOSIS — I110 Hypertensive heart disease with heart failure: Secondary | ICD-10-CM | POA: Diagnosis not present

## 2021-01-18 DIAGNOSIS — I350 Nonrheumatic aortic (valve) stenosis: Secondary | ICD-10-CM | POA: Diagnosis not present

## 2021-01-18 DIAGNOSIS — R270 Ataxia, unspecified: Secondary | ICD-10-CM | POA: Diagnosis not present

## 2021-01-18 DIAGNOSIS — I5030 Unspecified diastolic (congestive) heart failure: Secondary | ICD-10-CM | POA: Diagnosis not present

## 2021-01-18 DIAGNOSIS — I4891 Unspecified atrial fibrillation: Secondary | ICD-10-CM | POA: Diagnosis not present

## 2021-01-18 DIAGNOSIS — M150 Primary generalized (osteo)arthritis: Secondary | ICD-10-CM | POA: Diagnosis not present

## 2021-01-20 ENCOUNTER — Encounter (INDEPENDENT_AMBULATORY_CARE_PROVIDER_SITE_OTHER): Payer: Medicare Other | Admitting: Ophthalmology

## 2021-01-21 DIAGNOSIS — I4891 Unspecified atrial fibrillation: Secondary | ICD-10-CM | POA: Diagnosis not present

## 2021-01-21 DIAGNOSIS — I110 Hypertensive heart disease with heart failure: Secondary | ICD-10-CM | POA: Diagnosis not present

## 2021-01-21 DIAGNOSIS — I5030 Unspecified diastolic (congestive) heart failure: Secondary | ICD-10-CM | POA: Diagnosis not present

## 2021-01-21 DIAGNOSIS — M150 Primary generalized (osteo)arthritis: Secondary | ICD-10-CM | POA: Diagnosis not present

## 2021-01-21 DIAGNOSIS — R270 Ataxia, unspecified: Secondary | ICD-10-CM | POA: Diagnosis not present

## 2021-01-21 DIAGNOSIS — I350 Nonrheumatic aortic (valve) stenosis: Secondary | ICD-10-CM | POA: Diagnosis not present

## 2021-01-26 DIAGNOSIS — I110 Hypertensive heart disease with heart failure: Secondary | ICD-10-CM | POA: Diagnosis not present

## 2021-01-26 DIAGNOSIS — M150 Primary generalized (osteo)arthritis: Secondary | ICD-10-CM | POA: Diagnosis not present

## 2021-01-26 DIAGNOSIS — I5030 Unspecified diastolic (congestive) heart failure: Secondary | ICD-10-CM | POA: Diagnosis not present

## 2021-01-26 DIAGNOSIS — I350 Nonrheumatic aortic (valve) stenosis: Secondary | ICD-10-CM | POA: Diagnosis not present

## 2021-01-26 DIAGNOSIS — R270 Ataxia, unspecified: Secondary | ICD-10-CM | POA: Diagnosis not present

## 2021-01-26 DIAGNOSIS — I4891 Unspecified atrial fibrillation: Secondary | ICD-10-CM | POA: Diagnosis not present

## 2021-01-28 DIAGNOSIS — I4891 Unspecified atrial fibrillation: Secondary | ICD-10-CM | POA: Diagnosis not present

## 2021-01-28 DIAGNOSIS — I5030 Unspecified diastolic (congestive) heart failure: Secondary | ICD-10-CM | POA: Diagnosis not present

## 2021-01-28 DIAGNOSIS — R270 Ataxia, unspecified: Secondary | ICD-10-CM | POA: Diagnosis not present

## 2021-01-28 DIAGNOSIS — I350 Nonrheumatic aortic (valve) stenosis: Secondary | ICD-10-CM | POA: Diagnosis not present

## 2021-01-28 DIAGNOSIS — M150 Primary generalized (osteo)arthritis: Secondary | ICD-10-CM | POA: Diagnosis not present

## 2021-01-28 DIAGNOSIS — I110 Hypertensive heart disease with heart failure: Secondary | ICD-10-CM | POA: Diagnosis not present

## 2021-02-03 DIAGNOSIS — R270 Ataxia, unspecified: Secondary | ICD-10-CM | POA: Diagnosis not present

## 2021-02-03 DIAGNOSIS — I350 Nonrheumatic aortic (valve) stenosis: Secondary | ICD-10-CM | POA: Diagnosis not present

## 2021-02-03 DIAGNOSIS — I5030 Unspecified diastolic (congestive) heart failure: Secondary | ICD-10-CM | POA: Diagnosis not present

## 2021-02-03 DIAGNOSIS — I4891 Unspecified atrial fibrillation: Secondary | ICD-10-CM | POA: Diagnosis not present

## 2021-02-03 DIAGNOSIS — I110 Hypertensive heart disease with heart failure: Secondary | ICD-10-CM | POA: Diagnosis not present

## 2021-02-03 DIAGNOSIS — M150 Primary generalized (osteo)arthritis: Secondary | ICD-10-CM | POA: Diagnosis not present

## 2021-02-04 ENCOUNTER — Ambulatory Visit (INDEPENDENT_AMBULATORY_CARE_PROVIDER_SITE_OTHER): Payer: Medicare Other | Admitting: Ophthalmology

## 2021-02-04 ENCOUNTER — Encounter (INDEPENDENT_AMBULATORY_CARE_PROVIDER_SITE_OTHER): Payer: Self-pay | Admitting: Ophthalmology

## 2021-02-04 ENCOUNTER — Other Ambulatory Visit: Payer: Self-pay

## 2021-02-04 DIAGNOSIS — H35352 Cystoid macular degeneration, left eye: Secondary | ICD-10-CM

## 2021-02-04 DIAGNOSIS — H353124 Nonexudative age-related macular degeneration, left eye, advanced atrophic with subfoveal involvement: Secondary | ICD-10-CM | POA: Diagnosis not present

## 2021-02-04 DIAGNOSIS — H35372 Puckering of macula, left eye: Secondary | ICD-10-CM

## 2021-02-04 NOTE — Progress Notes (Signed)
02/04/2021     CHIEF COMPLAINT Patient presents for Post-op Follow-up (8 week post op os - surgery on 12/02/2020/Pt states, " I actually feel like my va is worse. It is really hard for me to see fine print and read the paper. I was for sure seeing better before surgery than I am now. I also feel like everything at the distance is blurry."/)   HISTORY OF PRESENT ILLNESS: Rebecca Norman is a 87 y.o. female who presents to the clinic today for:   HPI    Post-op Follow-up    Laterality: left eye   Discomfort: Negative for pain, itching, foreign body sensation, tearing and discharge   Vision: is worse   Comments: 8 week post op os - surgery on 12/02/2020 Pt states, " I actually feel like my va is worse. It is really hard for me to see fine print and read the paper. I was for sure seeing better before surgery than I am now. I also feel like everything at the distance is blurry."        Last edited by Kendra Opitz, COA on 02/04/2021  2:16 PM. (History)      Referring physician: [name], MD [address],  [place] [address]  HISTORICAL INFORMATION:   Selected notes from the MEDICAL RECORD NUMBER    Lab Results  Component Value Date   HGBA1C 5.6 05/09/2019     CURRENT MEDICATIONS: Current Outpatient Medications (Ophthalmic Drugs)  Medication Sig  . prednisoLONE acetate (PRED FORTE) 1 % ophthalmic suspension Place 1 drop into the left eye 4 (four) times daily.   No current facility-administered medications for this visit. (Ophthalmic Drugs)   Current Outpatient Medications (Other)  Medication Sig  . acetaminophen (TYLENOL) 500 MG tablet 1 tablet as needed  . apixaban (ELIQUIS) 5 MG TABS tablet Take 1 tablet (5 mg total) by mouth 2 (two) times daily.  . Cholecalciferol (VITAMIN D) 50 MCG (2000 UT) tablet 1 tablet  . ciprofloxacin (CIPRO) 250 MG tablet 1 tablet  . famotidine (PEPCID) 40 MG tablet 1 tablet at bedtime.  . furosemide (LASIX) 20 MG  tablet Take 1 tablet (20 mg total) by mouth daily.  Marland Kitchen levothyroxine (SYNTHROID) 50 MCG tablet 1 tablet in the morning on an empty stomach  . oxybutynin (DITROPAN) 5 MG tablet Take 5 mg by mouth at bedtime as needed for bladder spasms.   . OXYGEN Inhale 4.5 L into the lungs continuous.   . sertraline (ZOLOFT) 50 MG tablet Take 1 tablet by mouth daily.  Marland Kitchen UNABLE TO FIND 5 L continuous as directed  . vitamin B-12 (CYANOCOBALAMIN) 1000 MCG tablet 1 tablet   No current facility-administered medications for this visit. (Other)      REVIEW OF SYSTEMS:    ALLERGIES Allergies  Allergen Reactions  . Lisinopril Cough and Other (See Comments)  . Metronidazole Hives and Other (See Comments)    PAST MEDICAL HISTORY Past Medical History:  Diagnosis Date  . DDD (degenerative disc disease), cervical    severe/  auto fusion c2-c5  . Diverticulosis   . Hemorrhoids   . History of adenomatous polyp of colon    1995  adenomatous polyp/  1997 & 2003  hyperplastic polyp's  . History of bladder cancer    s/p  TURBT's, urologist-  Dr [name]  . History of cancer of ureter   . Hypertension   . Hypothyroid   . Nocturia   . Osteoarthritis   .  Osteopenia   . Symptomatic bradycardia 05/06/2019   Past Surgical History:  Procedure Laterality Date  . BACK SURGERY    . CARDIOVERSION N/A 08/09/2017   Procedure: CARDIOVERSION;  Surgeon: Larey Dresser, MD;  Location: [facility]COPY;  Service: Cardiovascular;  Laterality: N/A;  . CATARACT EXTRACTION W/ INTRAOCULAR LENS  IMPLANT, BILATERAL Bilateral   . CYSTOSCOPY W/ RETROGRADES  10/03/2011   Procedure: CYSTOSCOPY WITH RETROGRADE PYELOGRAM;  Surgeon: Molli Hazard, MD;  Location: [facility];  Service: Urology;;  . Consuela Mimes W/ RETROGRADES Bilateral 06/17/2016   Procedure: CYSTOSCOPY WITH RETROGRADE PYELOGRAM;  Surgeon: Alexis Frock, MD;  Location: [facility];  Service: Urology;  Laterality: Bilateral;  . CYSTOSCOPY  WITH BIOPSY N/A 06/17/2016   Procedure: CYSTOSCOPY WITH BIOPSY AND FULGERATION;  Surgeon: Alexis Frock, MD;  Location: [facility];  Service: Urology;  Laterality: N/A;  . LAMINECTOMY  2009   L3 - 4  . ORIF ANKLE FRACTURE Left 08/28/2017   Procedure: OPEN REDUCTION INTERNAL FIXATION (ORIF) ANKLE FRACTURE;  Surgeon: Shona Needles, MD;  Location: [facility];  Service: Orthopedics;  Laterality: Left;  . TRANSURETHRAL RESECTION OF BLADDER  x3 --  2009; 2010; 06-29-2010  . TRANSURETHRAL RESECTION OF BLADDER TUMOR  10/03/2011   Procedure: TRANSURETHRAL RESECTION OF BLADDER TUMOR (TURBT);  Surgeon: Molli Hazard, MD;  Location: [facility];  Service: Urology;  Laterality: N/A;    FAMILY HISTORY Family History  Problem Relation Age of Onset  . Breast cancer Sister   . Stomach cancer Sister   . Hernia Sister 23       HERNIA SURGERY COMPLICATIONS  . Colon cancer Neg Hx     SOCIAL HISTORY Social History   Tobacco Use  . Smoking status: Former Smoker    Years: 10.00    Types: Cigarettes    Quit date: 09/25/2005    Years since quitting: 15.3  . Smokeless tobacco: Never Used  Vaping Use  . Vaping Use: Never used  Substance Use Topics  . Alcohol use: Yes  . Drug use: No         OPHTHALMIC EXAM:  Base Eye Exam    Visual Acuity (ETDRS)      Right Left   Dist cc 20/60 -2 20/400   Dist ph cc NI NI   Correction: Glasses       Tonometry (Tonopen, 2:20 PM)      Right Left   Pressure 12 10       Pupils      Pupils Dark Light Shape React APD   Right PERRL 4 3 Round Slow None   Left PERRL 4 3 Round Slow None       Neuro/Psych    Oriented x3: Yes   Mood/Affect: Normal       Dilation    Left eye: 1.0% Mydriacyl, 2.5% Phenylephrine @ 2:20 PM        Slit Lamp and Fundus Exam    Slit Lamp Exam      Right Left   Lids/Lashes Normal Normal   Conjunctiva/Sclera White and quiet White and quiet   Cornea Clear Clear   Anterior Chamber Deep  and quiet Deep and quiet   Iris Round and reactive Round and reactive   Lens Centered posterior chamber intraocular lens Centered posterior chamber intraocular lens   Anterior Vitreous Normal Normal       Fundus Exam      Right Left   Posterior Vitreous  Clear, avitric   Disc  Normal   C/D Ratio  0.1   Macula  Retinal pigment epithelial mottling, Early age related macular degeneration,   Vessels  Normal   Periphery  Normal          IMAGING AND PROCEDURES  Imaging and Procedures for 02/04/21  OCT, Retina - OU - Both Eyes       Right Eye Quality was good. Scan locations included subfoveal. Central Foveal Thickness: 264. Progression has been stable. Findings include abnormal foveal contour, retinal drusen , no SRF, no IRF.   Left Eye Quality was good. Scan locations included subfoveal. Central Foveal Thickness: 386. Progression has worsened. Findings include retinal drusen , abnormal foveal contour, subretinal scarring, subretinal hyper-reflective material.   Notes Much improved macular thickening, much less CME, 9 week post vitrectomy membrane peel OS, with much less CME and retinal thickening.  Outer retinal scarring appears to limit acuity from underlying ARMD  Outer retinal drusen are present but no signs of CNVM OS or OD.                  ASSESSMENT/PLAN:  Left epiretinal membrane Vastly improved macular thickening some 2 months post recent vitrectomy membrane peel left eye.  Central CME secondary to epiretinal membrane vastly improved nonetheless still present.  Outer retinal scarring of macular degeneration however appears to be limiting acuity in the left eye.  Cystoid macular edema of left eye Component of epiretinal membrane side effect, improved post vitrectomy membrane peel for ERM  Advanced nonexudative age-related macular degeneration of left eye with subfoveal involvement The nature of dry age related macular degeneration was discussed with the  patient as well as its possible conversion to wet. The results of the AREDS 2 study was discussed with the patient. A diet rich in dark leafy green vegetables was advised and specific recommendations were made regarding supplements with AREDS 2 formulation . Control of hypertension and serum cholesterol may slow the disease. Smoking cessation is mandatory to slow the disease and diminish the risk of progressing to wet age related macular degeneration. The patient was instructed in the use of an [place] and was told to return immediately for any changes in the Grid. Stressed to the patient do not rub eyes      ICD-10-CM   1. Left epiretinal membrane  H35.372 OCT, Retina - OU - Both Eyes  2. Cystoid macular edema of left eye  H35.352   3. Advanced nonexudative age-related macular degeneration of left eye with subfoveal involvement  H35.3124     1.  Patient continues to improve anatomically in the left eye post vitrectomy membrane peel for severe epiretinal membrane secondary CME.  Scotoma growth likely improved and stabilized from the result of the surgery.  2.  Right atrophic ARMD with scarring subfoveal however appears to be limiting the acuity.  No specific therapy is available to improve this.  3.  Patient has ongoing itchy burning eyes.  I instructed her to purchase over-the-counter eyedrops that combat itching, allergy symptoms with particular attention to [place] Ordered this visit:  No orders of the defined types were placed in this encounter.      Return in about 6 months (around 08/06/2021) for DILATE OU, OCT.  There are no Patient Instructions on file for this visit.   Explained the diagnoses, plan, and follow up with the patient and they expressed understanding.  Patient expressed understanding of the importance of proper follow up  care.   Clent Demark. Angelyne Terwilliger M.D. Diseases & Surgery of the Retina and Vitreous [facility] [place] 02/04/21     Abbreviations: M myopia (nearsighted); A astigmatism; H hyperopia (farsighted); P presbyopia; Mrx spectacle prescription;  CTL contact lenses; OD right eye; OS left eye; OU both eyes  XT exotropia; ET esotropia; PEK punctate epithelial keratitis; PEE punctate epithelial erosions; DES dry eye syndrome; MGD meibomian gland dysfunction; ATs artificial tears; PFAT's preservative free artificial tears; [place] nuclear sclerotic cataract; PSC posterior subcapsular cataract; ERM epi-retinal membrane; PVD posterior vitreous detachment; RD retinal detachment; DM diabetes mellitus; DR diabetic retinopathy; NPDR non-proliferative diabetic retinopathy; PDR proliferative diabetic retinopathy; CSME clinically significant macular edema; DME diabetic macular edema; dbh dot blot hemorrhages; CWS cotton wool spot; POAG primary open angle glaucoma; C/D cup-to-disc ratio; HVF humphrey visual field; GVF goldmann visual field; OCT optical coherence tomography; IOP intraocular pressure; BRVO Branch retinal vein occlusion; CRVO central retinal vein occlusion; CRAO central retinal artery occlusion; BRAO branch retinal artery occlusion; RT retinal tear; SB scleral buckle; PPV pars plana vitrectomy; VH Vitreous hemorrhage; PRP panretinal laser photocoagulation; IVK intravitreal kenalog; VMT vitreomacular traction; MH Macular hole;  NVD neovascularization of the disc; NVE neovascularization elsewhere; AREDS age related eye disease study; ARMD age related macular degeneration; POAG primary open angle glaucoma; EBMD epithelial/anterior basement membrane dystrophy; ACIOL anterior chamber intraocular lens; IOL intraocular lens; PCIOL posterior chamber intraocular lens; Phaco/IOL phacoemulsification with intraocular lens placement; [place] photorefractive keratectomy; LASIK laser assisted in situ keratomileusis; HTN hypertension; DM diabetes mellitus; COPD chronic obstructive pulmonary disease

## 2021-02-04 NOTE — Assessment & Plan Note (Signed)
Component of epiretinal membrane side effect, improved post vitrectomy membrane peel for ERM

## 2021-02-04 NOTE — Assessment & Plan Note (Signed)
Vastly improved macular thickening some 2 months post recent vitrectomy membrane peel left eye.  Central CME secondary to epiretinal membrane vastly improved nonetheless still present.  Outer retinal scarring of macular degeneration however appears to be limiting acuity in the left eye.

## 2021-02-04 NOTE — Assessment & Plan Note (Signed)

## 2021-02-05 DIAGNOSIS — R270 Ataxia, unspecified: Secondary | ICD-10-CM | POA: Diagnosis not present

## 2021-02-05 DIAGNOSIS — I4891 Unspecified atrial fibrillation: Secondary | ICD-10-CM | POA: Diagnosis not present

## 2021-02-05 DIAGNOSIS — I350 Nonrheumatic aortic (valve) stenosis: Secondary | ICD-10-CM | POA: Diagnosis not present

## 2021-02-05 DIAGNOSIS — I5030 Unspecified diastolic (congestive) heart failure: Secondary | ICD-10-CM | POA: Diagnosis not present

## 2021-02-05 DIAGNOSIS — M150 Primary generalized (osteo)arthritis: Secondary | ICD-10-CM | POA: Diagnosis not present

## 2021-02-05 DIAGNOSIS — I110 Hypertensive heart disease with heart failure: Secondary | ICD-10-CM | POA: Diagnosis not present

## 2021-02-09 DIAGNOSIS — M150 Primary generalized (osteo)arthritis: Secondary | ICD-10-CM | POA: Diagnosis not present

## 2021-02-09 DIAGNOSIS — R270 Ataxia, unspecified: Secondary | ICD-10-CM | POA: Diagnosis not present

## 2021-02-09 DIAGNOSIS — I350 Nonrheumatic aortic (valve) stenosis: Secondary | ICD-10-CM | POA: Diagnosis not present

## 2021-02-09 DIAGNOSIS — I110 Hypertensive heart disease with heart failure: Secondary | ICD-10-CM | POA: Diagnosis not present

## 2021-02-09 DIAGNOSIS — I4891 Unspecified atrial fibrillation: Secondary | ICD-10-CM | POA: Diagnosis not present

## 2021-02-09 DIAGNOSIS — I5030 Unspecified diastolic (congestive) heart failure: Secondary | ICD-10-CM | POA: Diagnosis not present

## 2021-02-11 DIAGNOSIS — F324 Major depressive disorder, single episode, in partial remission: Secondary | ICD-10-CM | POA: Diagnosis not present

## 2021-02-11 DIAGNOSIS — Z9981 Dependence on supplemental oxygen: Secondary | ICD-10-CM | POA: Diagnosis not present

## 2021-02-11 DIAGNOSIS — Z7901 Long term (current) use of anticoagulants: Secondary | ICD-10-CM | POA: Diagnosis not present

## 2021-02-11 DIAGNOSIS — I4891 Unspecified atrial fibrillation: Secondary | ICD-10-CM | POA: Diagnosis not present

## 2021-02-11 DIAGNOSIS — I350 Nonrheumatic aortic (valve) stenosis: Secondary | ICD-10-CM | POA: Diagnosis not present

## 2021-02-11 DIAGNOSIS — Z9181 History of falling: Secondary | ICD-10-CM | POA: Diagnosis not present

## 2021-02-11 DIAGNOSIS — I5030 Unspecified diastolic (congestive) heart failure: Secondary | ICD-10-CM | POA: Diagnosis not present

## 2021-02-11 DIAGNOSIS — H3530 Unspecified macular degeneration: Secondary | ICD-10-CM | POA: Diagnosis not present

## 2021-02-11 DIAGNOSIS — I110 Hypertensive heart disease with heart failure: Secondary | ICD-10-CM | POA: Diagnosis not present

## 2021-02-11 DIAGNOSIS — N3281 Overactive bladder: Secondary | ICD-10-CM | POA: Diagnosis not present

## 2021-02-11 DIAGNOSIS — R270 Ataxia, unspecified: Secondary | ICD-10-CM | POA: Diagnosis not present

## 2021-02-11 DIAGNOSIS — G43909 Migraine, unspecified, not intractable, without status migrainosus: Secondary | ICD-10-CM | POA: Diagnosis not present

## 2021-02-11 DIAGNOSIS — D6859 Other primary thrombophilia: Secondary | ICD-10-CM | POA: Diagnosis not present

## 2021-02-11 DIAGNOSIS — E039 Hypothyroidism, unspecified: Secondary | ICD-10-CM | POA: Diagnosis not present

## 2021-02-11 DIAGNOSIS — M150 Primary generalized (osteo)arthritis: Secondary | ICD-10-CM | POA: Diagnosis not present

## 2021-02-11 DIAGNOSIS — M8580 Other specified disorders of bone density and structure, unspecified site: Secondary | ICD-10-CM | POA: Diagnosis not present

## 2021-02-11 DIAGNOSIS — D519 Vitamin B12 deficiency anemia, unspecified: Secondary | ICD-10-CM | POA: Diagnosis not present

## 2021-02-11 DIAGNOSIS — D126 Benign neoplasm of colon, unspecified: Secondary | ICD-10-CM | POA: Diagnosis not present

## 2021-02-11 DIAGNOSIS — C679 Malignant neoplasm of bladder, unspecified: Secondary | ICD-10-CM | POA: Diagnosis not present

## 2021-02-11 DIAGNOSIS — Z8673 Personal history of transient ischemic attack (TIA), and cerebral infarction without residual deficits: Secondary | ICD-10-CM | POA: Diagnosis not present

## 2021-02-11 DIAGNOSIS — Z87891 Personal history of nicotine dependence: Secondary | ICD-10-CM | POA: Diagnosis not present

## 2021-02-11 DIAGNOSIS — K449 Diaphragmatic hernia without obstruction or gangrene: Secondary | ICD-10-CM | POA: Diagnosis not present

## 2021-02-15 ENCOUNTER — Other Ambulatory Visit: Payer: Self-pay

## 2021-02-15 ENCOUNTER — Encounter: Payer: Self-pay | Admitting: Physician Assistant

## 2021-02-15 ENCOUNTER — Ambulatory Visit (INDEPENDENT_AMBULATORY_CARE_PROVIDER_SITE_OTHER): Payer: Medicare Other | Admitting: Physician Assistant

## 2021-02-15 VITALS — BP 130/70 | HR 68 | Ht 63.0 in | Wt 146.2 lb

## 2021-02-15 DIAGNOSIS — E039 Hypothyroidism, unspecified: Secondary | ICD-10-CM

## 2021-02-15 DIAGNOSIS — I480 Paroxysmal atrial fibrillation: Secondary | ICD-10-CM

## 2021-02-15 DIAGNOSIS — I10 Essential (primary) hypertension: Secondary | ICD-10-CM | POA: Diagnosis not present

## 2021-02-15 DIAGNOSIS — Z8673 Personal history of transient ischemic attack (TIA), and cerebral infarction without residual deficits: Secondary | ICD-10-CM

## 2021-02-15 NOTE — Progress Notes (Signed)
Cardiology Office Note:    Date:  02/17/2021   ID:  NORMAJEAN Norman, DOB [birth year], MRN [id number]  PCP:  [name], MD   [facility] Providers Cardiologist:  Kirk Ruths, MD     Referring MD: [name], MD   Chief Complaint  Patient presents with   Follow-up    Seen for Dr. [name]    History of Present Illness:    Rebecca Norman is a 87 y.o. female with a hx of PAF, hypertension, hypothyroidism, history of bladder cancer and history of bradycardia.  Heart monitor in October 2018 showed PAF.  Echocardiogram in March 2019 showed EF 65 to 70%, mild aortic stenosis, mild MR, moderate LAE.  Patient was admitted in September 2020 with generalized weakness and fall.  Heart rate was in the 40s.  On further review, it was found her heart rate was normal in sinus rhythm in the 50s with PACs.  It was not felt bradycardia was the main culprit for her weakness.  Neurology assessment was recommended.  MRI did show acute infarct in the right pons, right cerebellum, right cortex and the left Podoben.  There were scattered area of hemorrhage which could be subacute versus chronic.  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  Due to persistent bradycardia on telemetry, her amiodarone was discontinued.  She was subsequently discharged to [facility] skilled nursing facility for rehab then eventually home.  More recently, patient was seen in the A. fib clinic in August 2021 due to irregular heartbeat.  EKG showed sinus rhythm with PACs.  A Zio patch was offered however patient declined.  I last saw the patient in December 2021 at which time she was doing well.  Her daughter comes to visit her on a weekly basis.  Patient presents today for follow-up.  She is doing well from cardiac perspective.  She is still short of breath at baseline and uses 4 L/min nasal cannula.  Her breathing has not changed recently.  Heart rate is well controlled on current therapy.  Cost of Eliquis is very  high, she was turned down for the patient assistance a year ago, we will try patient's assistance again.  We did discuss alternatives such as Xarelto or Coumadin, she did not wish to start on Coumadin.  Otherwise she can follow-up in 6 months.   Past Medical History:  Diagnosis Date   DDD (degenerative disc disease), cervical    severe/  auto fusion c2-c5   Diverticulosis    Hemorrhoids    History of adenomatous polyp of colon    1995  adenomatous polyp/  1997 & 2003  hyperplastic polyp's   History of bladder cancer    s/p  TURBT's, urologist-  Dr [name]   History of cancer of ureter    Hypertension    Hypothyroid    Nocturia    Osteoarthritis    Osteopenia    Symptomatic bradycardia 05/06/2019    Past Surgical History:  Procedure Laterality Date   BACK SURGERY     CARDIOVERSION N/A 08/09/2017   Procedure: CARDIOVERSION;  Surgeon: Larey Dresser, MD;  Location: [facility];  Service: Cardiovascular;  Laterality: N/A;   CATARACT EXTRACTION W/ INTRAOCULAR LENS  IMPLANT, BILATERAL Bilateral    CYSTOSCOPY W/ RETROGRADES  10/03/2011   Procedure: CYSTOSCOPY WITH RETROGRADE PYELOGRAM;  Surgeon: Molli Hazard, MD;  Location: [facility];  Service: Urology;;   CYSTOSCOPY W/ RETROGRADES Bilateral 06/17/2016   Procedure: CYSTOSCOPY WITH RETROGRADE PYELOGRAM;  Surgeon: Alexis Frock, MD;  Location: [facility];  Service: Urology;  Laterality: Bilateral;   CYSTOSCOPY WITH BIOPSY N/A 06/17/2016   Procedure: CYSTOSCOPY WITH BIOPSY AND FULGERATION;  Surgeon: Alexis Frock, MD;  Location: [facility];  Service: Urology;  Laterality: N/A;   LAMINECTOMY  2009   L3 - 4   ORIF ANKLE FRACTURE Left 08/28/2017   Procedure: OPEN REDUCTION INTERNAL FIXATION (ORIF) ANKLE FRACTURE;  Surgeon: Shona Needles, MD;  Location: [facility];  Service: Orthopedics;  Laterality: Left;   TRANSURETHRAL RESECTION OF BLADDER  x3 --  2009; 2010; 06-29-2010    TRANSURETHRAL RESECTION OF BLADDER TUMOR  10/03/2011   Procedure: TRANSURETHRAL RESECTION OF BLADDER TUMOR (TURBT);  Surgeon: Molli Hazard, MD;  Location: [facility];  Service: Urology;  Laterality: N/A;    Current Medications: Current Meds  Medication Sig   acetaminophen (TYLENOL) 500 MG tablet 1 tablet as needed   apixaban (ELIQUIS) 5 MG TABS tablet Take 1 tablet (5 mg total) by mouth 2 (two) times daily.   Cholecalciferol (VITAMIN D) 50 MCG (2000 UT) tablet 1 tablet   ciprofloxacin (CIPRO) 250 MG tablet Take 250 mg by mouth as needed.   famotidine (PEPCID) 40 MG tablet 1 tablet at bedtime.   furosemide (LASIX) 20 MG tablet Take 1 tablet (20 mg total) by mouth daily.   levothyroxine (SYNTHROID) 50 MCG tablet 1 tablet in the morning on an empty stomach   oxybutynin (DITROPAN) 5 MG tablet Take 5 mg by mouth at bedtime as needed for bladder spasms.    OXYGEN Inhale 4 L into the lungs continuous. Inhale 4-5 L into lungs continuous   sertraline (ZOLOFT) 50 MG tablet Take 1 tablet by mouth daily.   UNABLE TO FIND 5 L continuous as directed   vitamin B-12 (CYANOCOBALAMIN) 1000 MCG tablet 1 tablet     Allergies:   Lisinopril and Metronidazole   Social History   Socioeconomic History   Marital status: Widowed    Spouse name: Not on file   Number of children: Not on file   Years of education: Not on file   Highest education level: Not on file  Occupational History   Occupation: Retired  Tobacco Use   Smoking status: Former    Years: 10.00    Pack years: 0.00    Types: Cigarettes    Quit date: 09/25/2005    Years since quitting: 15.4   Smokeless tobacco: Never  Vaping Use   Vaping Use: Never used  Substance and Sexual Activity   Alcohol use: Yes   Drug use: No   Sexual activity: Not on file  Other Topics Concern   Not on file  Social History Narrative   She lives alone in [place].  Daughter helps in her care.   Social Determinants of Health    Financial Resource Strain: Not on file  Food Insecurity: Not on file  Transportation Needs: Not on file  Physical Activity: Not on file  Stress: Not on file  Social Connections: Not on file     Family History: The patient's family history includes Breast cancer in her sister; Hernia (age of onset: 32) in her sister; Stomach cancer in her sister. There is no history of Colon cancer.  ROS:   Please see the history of present illness.     All other systems reviewed and are negative.  EKGs/Labs/Other Studies Reviewed:    The following studies were reviewed today:  Echo 05/07/2019  1. The left ventricle has hyperdynamic systolic function, with an  ejection fraction of >65%. The cavity size was normal. There is mild  concentric left ventricular hypertrophy. Left ventricular diastolic  Doppler parameters are consistent with  pseudonormalization. Elevated mean left atrial pressure No evidence of  left ventricular regional wall motion abnormalities.   2. The right ventricle has normal systolic function. The cavity was  normal. There is no increase in right ventricular wall thickness. Right  ventricular systolic pressure is mildly elevated with an estimated  pressure of 45.5 mmHg.   3. Left atrial size was severely dilated.   4. The mitral valve is degenerative. There is moderate mitral annular  calcification present. Mitral valve regurgitation is moderate by color  flow Doppler. The MR jet is centrally-directed. No evidence of mitral  valve stenosis.   5. The tricuspid valve is grossly normal.   6. The aortic valve is tricuspid. Mild thickening of the aortic valve.  Sclerosis without any evidence of stenosis of the aortic valve. Aortic  valve regurgitation is mild by color flow Doppler.   7. The aorta is normal unless otherwise noted.   EKG:  EKG is not ordered today.    Recent Labs: 03/16/2020: ALT 10; B Natriuretic Peptide 662.9; BUN 18; Creatinine, Ser 0.84; Hemoglobin 10.8;  Platelets 189; Potassium 4.1; Sodium 134  Recent Lipid Panel    Component Value Date/Time   CHOL 127 05/09/2019 0352   TRIG 110 05/09/2019 0352   HDL 40 (L) 05/09/2019 0352   CHOLHDL 3.2 05/09/2019 0352   VLDL 22 05/09/2019 0352   LDLCALC 65 05/09/2019 0352     Risk Assessment/Calculations:    CHA2DS2-VASc Score = 6  This indicates a 9.7% annual risk of stroke. The patient's score is based upon: CHF History: No HTN History: Yes Diabetes History: No Stroke History: Yes Vascular Disease History: No Age Score: 2 Gender Score: 1     Physical Exam:    VS:  BP 130/70   Pulse 68   Ht 5\' 3"  (1.6 m)   Wt 146 lb 3.2 oz (66.3 kg)   SpO2 99%   BMI 25.90 kg/m     Wt Readings from Last 3 Encounters:  02/15/21 146 lb 3.2 oz (66.3 kg)  08/24/20 152 lb (68.9 kg)  04/17/20 160 lb (72.6 kg)     GEN:  Well nourished, well developed in no acute distress HEENT: Normal NECK: No JVD; No carotid bruits LYMPHATICS: No lymphadenopathy CARDIAC: RRR, no murmurs, rubs, gallops RESPIRATORY:  Clear to auscultation without rales, wheezing or rhonchi  ABDOMEN: Soft, non-tender, non-distended MUSCULOSKELETAL:  No edema; No deformity  SKIN: Warm and dry NEUROLOGIC:  Alert and oriented x 3 PSYCHIATRIC:  Normal affect   ASSESSMENT:    1. PAF (paroxysmal atrial fibrillation) ([place])   2. Primary hypertension   3. Hypothyroidism, unspecified type   4. H/O: CVA (cerebrovascular accident)    PLAN:    In order of problems listed above:  PAF: Continue Eliquis.  No AV nodal blocking agent given history of bradycardia  Hypertension: Blood pressure stable  Hypothyroidism: On levothyroxine  History of CVA: No recent recurrence.     Medication Adjustments/Labs and Tests Ordered: Current medicines are reviewed at length with the patient today.  Concerns regarding medicines are outlined above.  No orders of the defined types were placed in this encounter.  No orders of the defined types  were placed in this encounter.   Patient Instructions  Medication Instructions:  Your  physician recommends that you continue on your current medications as directed. Please refer to the Current Medication list given to you today.  *If you need a refill on your cardiac medications before your next appointment, please call your pharmacy*   Lab Work: NONE ordered at this time of appointment   If you have labs (blood work) drawn today and your tests are completely normal, you will receive your results only by: [place] (if you have MyChart) OR A paper copy in the mail If you have any lab test that is abnormal or we need to change your treatment, we will call you to review the results.   Testing/Procedures: NONE ordered at this time of appointment     Follow-Up: At [facility], you and your health needs are our priority.  As part of our continuing mission to provide you with exceptional heart care, we have created designated Provider Care Teams.  These Care Teams include your primary Cardiologist (physician) and Advanced Practice Providers (APPs -  Physician Assistants and Nurse Practitioners) who all work together to provide you with the care you need, when you need it.  We recommend signing up for the patient portal called "MyChart".  Sign up information is provided on this After Visit Summary.  MyChart is used to connect with patients for Virtual Visits (Telemedicine).  Patients are able to view lab/test results, encounter notes, upcoming appointments, etc.  Non-urgent messages can be sent to your provider as well.   To learn more about what you can do with MyChart, go to [email or web address].    Your next appointment:   6 month(s)  The format for your next appointment:   In Person  Provider:   Kirk Ruths, MD  Other Instructions    Signed, Almyra Deforest, [place]  02/17/2021 11:31 PM    [place]

## 2021-02-15 NOTE — Patient Instructions (Signed)
Medication Instructions:  Your physician recommends that you continue on your current medications as directed. Please refer to the Current Medication list given to you today.  *If you need a refill on your cardiac medications before your next appointment, please call your pharmacy*  Lab Work: NONE ordered at this time of appointment   If you have labs (blood work) drawn today and your tests are completely normal, you will receive your results only by: . MyChart Message (if you have MyChart) OR . A paper copy in the mail If you have any lab test that is abnormal or we need to change your treatment, we will call you to review the results.  Testing/Procedures: NONE ordered at this time of appointment   Follow-Up: At [facility], you and your health needs are our priority.  As part of our continuing mission to provide you with exceptional heart care, we have created designated Provider Care Teams.  These Care Teams include your primary Cardiologist (physician) and Advanced Practice Providers (APPs -  Physician Assistants and Nurse Practitioners) who all work together to provide you with the care you need, when you need it.  We recommend signing up for the patient portal called "MyChart".  Sign up information is provided on this After Visit Summary.  MyChart is used to connect with patients for Virtual Visits (Telemedicine).  Patients are able to view lab/test results, encounter notes, upcoming appointments, etc.  Non-urgent messages can be sent to your provider as well.   To learn more about what you can do with MyChart, go to [email or web address].    Your next appointment:   6 month(s)  The format for your next appointment:   In Person  Provider:   Brian Crenshaw, MD  Other Instructions   

## 2021-02-17 ENCOUNTER — Encounter: Payer: Self-pay | Admitting: Physician Assistant

## 2021-02-19 DIAGNOSIS — M150 Primary generalized (osteo)arthritis: Secondary | ICD-10-CM | POA: Diagnosis not present

## 2021-02-19 DIAGNOSIS — I5030 Unspecified diastolic (congestive) heart failure: Secondary | ICD-10-CM | POA: Diagnosis not present

## 2021-02-19 DIAGNOSIS — I350 Nonrheumatic aortic (valve) stenosis: Secondary | ICD-10-CM | POA: Diagnosis not present

## 2021-02-19 DIAGNOSIS — R270 Ataxia, unspecified: Secondary | ICD-10-CM | POA: Diagnosis not present

## 2021-02-19 DIAGNOSIS — I110 Hypertensive heart disease with heart failure: Secondary | ICD-10-CM | POA: Diagnosis not present

## 2021-02-19 DIAGNOSIS — I4891 Unspecified atrial fibrillation: Secondary | ICD-10-CM | POA: Diagnosis not present

## 2021-02-24 ENCOUNTER — Ambulatory Visit: Payer: Medicare Other | Admitting: Cardiology

## 2021-02-25 DIAGNOSIS — I350 Nonrheumatic aortic (valve) stenosis: Secondary | ICD-10-CM | POA: Diagnosis not present

## 2021-02-25 DIAGNOSIS — I110 Hypertensive heart disease with heart failure: Secondary | ICD-10-CM | POA: Diagnosis not present

## 2021-02-25 DIAGNOSIS — I4891 Unspecified atrial fibrillation: Secondary | ICD-10-CM | POA: Diagnosis not present

## 2021-02-25 DIAGNOSIS — M150 Primary generalized (osteo)arthritis: Secondary | ICD-10-CM | POA: Diagnosis not present

## 2021-02-25 DIAGNOSIS — R270 Ataxia, unspecified: Secondary | ICD-10-CM | POA: Diagnosis not present

## 2021-02-25 DIAGNOSIS — I5030 Unspecified diastolic (congestive) heart failure: Secondary | ICD-10-CM | POA: Diagnosis not present

## 2021-03-01 DIAGNOSIS — R270 Ataxia, unspecified: Secondary | ICD-10-CM | POA: Diagnosis not present

## 2021-03-01 DIAGNOSIS — I110 Hypertensive heart disease with heart failure: Secondary | ICD-10-CM | POA: Diagnosis not present

## 2021-03-01 DIAGNOSIS — I350 Nonrheumatic aortic (valve) stenosis: Secondary | ICD-10-CM | POA: Diagnosis not present

## 2021-03-01 DIAGNOSIS — I5030 Unspecified diastolic (congestive) heart failure: Secondary | ICD-10-CM | POA: Diagnosis not present

## 2021-03-01 DIAGNOSIS — I4891 Unspecified atrial fibrillation: Secondary | ICD-10-CM | POA: Diagnosis not present

## 2021-03-01 DIAGNOSIS — M150 Primary generalized (osteo)arthritis: Secondary | ICD-10-CM | POA: Diagnosis not present

## 2021-03-02 ENCOUNTER — Ambulatory Visit: Payer: Medicare Other | Admitting: Podiatry

## 2021-03-09 DIAGNOSIS — R040 Epistaxis: Secondary | ICD-10-CM | POA: Diagnosis not present

## 2021-03-10 DIAGNOSIS — I4891 Unspecified atrial fibrillation: Secondary | ICD-10-CM | POA: Diagnosis not present

## 2021-03-10 DIAGNOSIS — R270 Ataxia, unspecified: Secondary | ICD-10-CM | POA: Diagnosis not present

## 2021-03-10 DIAGNOSIS — I5030 Unspecified diastolic (congestive) heart failure: Secondary | ICD-10-CM | POA: Diagnosis not present

## 2021-03-10 DIAGNOSIS — I110 Hypertensive heart disease with heart failure: Secondary | ICD-10-CM | POA: Diagnosis not present

## 2021-03-10 DIAGNOSIS — I350 Nonrheumatic aortic (valve) stenosis: Secondary | ICD-10-CM | POA: Diagnosis not present

## 2021-03-10 DIAGNOSIS — M150 Primary generalized (osteo)arthritis: Secondary | ICD-10-CM | POA: Diagnosis not present

## 2021-03-13 DIAGNOSIS — N3281 Overactive bladder: Secondary | ICD-10-CM | POA: Diagnosis not present

## 2021-03-13 DIAGNOSIS — D126 Benign neoplasm of colon, unspecified: Secondary | ICD-10-CM | POA: Diagnosis not present

## 2021-03-13 DIAGNOSIS — Z8673 Personal history of transient ischemic attack (TIA), and cerebral infarction without residual deficits: Secondary | ICD-10-CM | POA: Diagnosis not present

## 2021-03-13 DIAGNOSIS — I5030 Unspecified diastolic (congestive) heart failure: Secondary | ICD-10-CM | POA: Diagnosis not present

## 2021-03-13 DIAGNOSIS — Z9181 History of falling: Secondary | ICD-10-CM | POA: Diagnosis not present

## 2021-03-13 DIAGNOSIS — I4891 Unspecified atrial fibrillation: Secondary | ICD-10-CM | POA: Diagnosis not present

## 2021-03-13 DIAGNOSIS — K449 Diaphragmatic hernia without obstruction or gangrene: Secondary | ICD-10-CM | POA: Diagnosis not present

## 2021-03-13 DIAGNOSIS — F324 Major depressive disorder, single episode, in partial remission: Secondary | ICD-10-CM | POA: Diagnosis not present

## 2021-03-13 DIAGNOSIS — Z79899 Other long term (current) drug therapy: Secondary | ICD-10-CM | POA: Diagnosis not present

## 2021-03-13 DIAGNOSIS — Z87891 Personal history of nicotine dependence: Secondary | ICD-10-CM | POA: Diagnosis not present

## 2021-03-13 DIAGNOSIS — Z9981 Dependence on supplemental oxygen: Secondary | ICD-10-CM | POA: Diagnosis not present

## 2021-03-13 DIAGNOSIS — D519 Vitamin B12 deficiency anemia, unspecified: Secondary | ICD-10-CM | POA: Diagnosis not present

## 2021-03-13 DIAGNOSIS — R270 Ataxia, unspecified: Secondary | ICD-10-CM | POA: Diagnosis not present

## 2021-03-13 DIAGNOSIS — D696 Thrombocytopenia, unspecified: Secondary | ICD-10-CM | POA: Diagnosis not present

## 2021-03-13 DIAGNOSIS — I110 Hypertensive heart disease with heart failure: Secondary | ICD-10-CM | POA: Diagnosis not present

## 2021-03-13 DIAGNOSIS — M150 Primary generalized (osteo)arthritis: Secondary | ICD-10-CM | POA: Diagnosis not present

## 2021-03-13 DIAGNOSIS — G43909 Migraine, unspecified, not intractable, without status migrainosus: Secondary | ICD-10-CM | POA: Diagnosis not present

## 2021-03-13 DIAGNOSIS — M8580 Other specified disorders of bone density and structure, unspecified site: Secondary | ICD-10-CM | POA: Diagnosis not present

## 2021-03-13 DIAGNOSIS — I350 Nonrheumatic aortic (valve) stenosis: Secondary | ICD-10-CM | POA: Diagnosis not present

## 2021-03-13 DIAGNOSIS — Z7901 Long term (current) use of anticoagulants: Secondary | ICD-10-CM | POA: Diagnosis not present

## 2021-03-13 DIAGNOSIS — H3530 Unspecified macular degeneration: Secondary | ICD-10-CM | POA: Diagnosis not present

## 2021-03-13 DIAGNOSIS — E039 Hypothyroidism, unspecified: Secondary | ICD-10-CM | POA: Diagnosis not present

## 2021-03-13 DIAGNOSIS — C679 Malignant neoplasm of bladder, unspecified: Secondary | ICD-10-CM | POA: Diagnosis not present

## 2021-03-15 DIAGNOSIS — I4891 Unspecified atrial fibrillation: Secondary | ICD-10-CM | POA: Diagnosis not present

## 2021-03-15 DIAGNOSIS — I350 Nonrheumatic aortic (valve) stenosis: Secondary | ICD-10-CM | POA: Diagnosis not present

## 2021-03-15 DIAGNOSIS — M150 Primary generalized (osteo)arthritis: Secondary | ICD-10-CM | POA: Diagnosis not present

## 2021-03-15 DIAGNOSIS — R270 Ataxia, unspecified: Secondary | ICD-10-CM | POA: Diagnosis not present

## 2021-03-15 DIAGNOSIS — I110 Hypertensive heart disease with heart failure: Secondary | ICD-10-CM | POA: Diagnosis not present

## 2021-03-15 DIAGNOSIS — I5030 Unspecified diastolic (congestive) heart failure: Secondary | ICD-10-CM | POA: Diagnosis not present

## 2021-03-23 ENCOUNTER — Other Ambulatory Visit: Payer: Self-pay

## 2021-03-23 ENCOUNTER — Ambulatory Visit (INDEPENDENT_AMBULATORY_CARE_PROVIDER_SITE_OTHER): Payer: Medicare Other | Admitting: Podiatry

## 2021-03-23 ENCOUNTER — Encounter: Payer: Self-pay | Admitting: Podiatry

## 2021-03-23 DIAGNOSIS — D689 Coagulation defect, unspecified: Secondary | ICD-10-CM

## 2021-03-23 DIAGNOSIS — I5030 Unspecified diastolic (congestive) heart failure: Secondary | ICD-10-CM | POA: Diagnosis not present

## 2021-03-23 DIAGNOSIS — M79675 Pain in left toe(s): Secondary | ICD-10-CM | POA: Diagnosis not present

## 2021-03-23 DIAGNOSIS — B351 Tinea unguium: Secondary | ICD-10-CM

## 2021-03-23 DIAGNOSIS — I4891 Unspecified atrial fibrillation: Secondary | ICD-10-CM | POA: Diagnosis not present

## 2021-03-23 DIAGNOSIS — M150 Primary generalized (osteo)arthritis: Secondary | ICD-10-CM | POA: Diagnosis not present

## 2021-03-23 DIAGNOSIS — I350 Nonrheumatic aortic (valve) stenosis: Secondary | ICD-10-CM | POA: Diagnosis not present

## 2021-03-23 DIAGNOSIS — M79674 Pain in right toe(s): Secondary | ICD-10-CM | POA: Diagnosis not present

## 2021-03-23 DIAGNOSIS — R270 Ataxia, unspecified: Secondary | ICD-10-CM | POA: Diagnosis not present

## 2021-03-23 DIAGNOSIS — I110 Hypertensive heart disease with heart failure: Secondary | ICD-10-CM | POA: Diagnosis not present

## 2021-03-23 NOTE — Progress Notes (Signed)
This patient returns to my office for at risk foot care.  This patient requires this care by a professional since this patient will be at risk due to having coagulation disorder.  Patient is taking eliquis.This patient presents to the office in a wheelchair with her daughter This patient is unable to cut nails herself since the patient cannot reach her nails.These nails are painful walking and wearing shoes.  This patient presents for at risk foot care today.  General Appearance  Alert, conversant and in no acute stress.  Vascular  Dorsalis pedis   pulses are not   palpable  bilaterally. Posterior tibial pulses are not palpable  B/L. Capillary return is within normal limits  bilaterally. Cold feet  Bilaterally. Absent digital hair.  Neurologic  Senn-Weinstein monofilament wire test within normal limits  bilaterally. Muscle power within normal limits bilaterally.  Nails Thick disfigured discolored nails with subungual debris  from hallux to fifth toes bilaterally. No evidence of bacterial infection or drainage bilaterally.  Orthopedic  No limitations of motion  feet .  No crepitus or effusions noted.  HAV with overlapping second toe  B/L.  Plantar flexed met second left foot.    Skin  normotropic skin with no porokeratosis noted bilaterally.  No signs of infections or ulcers noted.     Onychomycosis  Pain in right toes  Pain in left toes  Consent was obtained for treatment procedures.   Mechanical debridement of nails 1-5  bilaterally performed with a nail nipper.  Filed with dremel without incident.     Return office visit   3 months                   Told patient to return for periodic foot care and evaluation due to potential at risk complications.   Gardiner Barefoot DPM

## 2021-03-29 DIAGNOSIS — I5030 Unspecified diastolic (congestive) heart failure: Secondary | ICD-10-CM | POA: Diagnosis not present

## 2021-03-29 DIAGNOSIS — I4891 Unspecified atrial fibrillation: Secondary | ICD-10-CM | POA: Diagnosis not present

## 2021-03-29 DIAGNOSIS — M150 Primary generalized (osteo)arthritis: Secondary | ICD-10-CM | POA: Diagnosis not present

## 2021-03-29 DIAGNOSIS — I350 Nonrheumatic aortic (valve) stenosis: Secondary | ICD-10-CM | POA: Diagnosis not present

## 2021-03-29 DIAGNOSIS — I110 Hypertensive heart disease with heart failure: Secondary | ICD-10-CM | POA: Diagnosis not present

## 2021-03-29 DIAGNOSIS — R270 Ataxia, unspecified: Secondary | ICD-10-CM | POA: Diagnosis not present

## 2021-04-05 DIAGNOSIS — I4891 Unspecified atrial fibrillation: Secondary | ICD-10-CM | POA: Diagnosis not present

## 2021-04-05 DIAGNOSIS — M150 Primary generalized (osteo)arthritis: Secondary | ICD-10-CM | POA: Diagnosis not present

## 2021-04-05 DIAGNOSIS — I350 Nonrheumatic aortic (valve) stenosis: Secondary | ICD-10-CM | POA: Diagnosis not present

## 2021-04-05 DIAGNOSIS — R270 Ataxia, unspecified: Secondary | ICD-10-CM | POA: Diagnosis not present

## 2021-04-05 DIAGNOSIS — I5030 Unspecified diastolic (congestive) heart failure: Secondary | ICD-10-CM | POA: Diagnosis not present

## 2021-04-05 DIAGNOSIS — I110 Hypertensive heart disease with heart failure: Secondary | ICD-10-CM | POA: Diagnosis not present

## 2021-04-12 DIAGNOSIS — C679 Malignant neoplasm of bladder, unspecified: Secondary | ICD-10-CM | POA: Diagnosis not present

## 2021-04-12 DIAGNOSIS — K449 Diaphragmatic hernia without obstruction or gangrene: Secondary | ICD-10-CM | POA: Diagnosis not present

## 2021-04-12 DIAGNOSIS — I5030 Unspecified diastolic (congestive) heart failure: Secondary | ICD-10-CM | POA: Diagnosis not present

## 2021-04-12 DIAGNOSIS — I4891 Unspecified atrial fibrillation: Secondary | ICD-10-CM | POA: Diagnosis not present

## 2021-04-12 DIAGNOSIS — Z9981 Dependence on supplemental oxygen: Secondary | ICD-10-CM | POA: Diagnosis not present

## 2021-04-12 DIAGNOSIS — R270 Ataxia, unspecified: Secondary | ICD-10-CM | POA: Diagnosis not present

## 2021-04-12 DIAGNOSIS — N3281 Overactive bladder: Secondary | ICD-10-CM | POA: Diagnosis not present

## 2021-04-12 DIAGNOSIS — D519 Vitamin B12 deficiency anemia, unspecified: Secondary | ICD-10-CM | POA: Diagnosis not present

## 2021-04-12 DIAGNOSIS — Z8673 Personal history of transient ischemic attack (TIA), and cerebral infarction without residual deficits: Secondary | ICD-10-CM | POA: Diagnosis not present

## 2021-04-12 DIAGNOSIS — Z9181 History of falling: Secondary | ICD-10-CM | POA: Diagnosis not present

## 2021-04-12 DIAGNOSIS — H3530 Unspecified macular degeneration: Secondary | ICD-10-CM | POA: Diagnosis not present

## 2021-04-12 DIAGNOSIS — Z87891 Personal history of nicotine dependence: Secondary | ICD-10-CM | POA: Diagnosis not present

## 2021-04-12 DIAGNOSIS — I350 Nonrheumatic aortic (valve) stenosis: Secondary | ICD-10-CM | POA: Diagnosis not present

## 2021-04-12 DIAGNOSIS — F324 Major depressive disorder, single episode, in partial remission: Secondary | ICD-10-CM | POA: Diagnosis not present

## 2021-04-12 DIAGNOSIS — G43909 Migraine, unspecified, not intractable, without status migrainosus: Secondary | ICD-10-CM | POA: Diagnosis not present

## 2021-04-12 DIAGNOSIS — M8580 Other specified disorders of bone density and structure, unspecified site: Secondary | ICD-10-CM | POA: Diagnosis not present

## 2021-04-12 DIAGNOSIS — D696 Thrombocytopenia, unspecified: Secondary | ICD-10-CM | POA: Diagnosis not present

## 2021-04-12 DIAGNOSIS — Z79899 Other long term (current) drug therapy: Secondary | ICD-10-CM | POA: Diagnosis not present

## 2021-04-12 DIAGNOSIS — M150 Primary generalized (osteo)arthritis: Secondary | ICD-10-CM | POA: Diagnosis not present

## 2021-04-12 DIAGNOSIS — I110 Hypertensive heart disease with heart failure: Secondary | ICD-10-CM | POA: Diagnosis not present

## 2021-04-12 DIAGNOSIS — Z7901 Long term (current) use of anticoagulants: Secondary | ICD-10-CM | POA: Diagnosis not present

## 2021-04-12 DIAGNOSIS — D126 Benign neoplasm of colon, unspecified: Secondary | ICD-10-CM | POA: Diagnosis not present

## 2021-04-12 DIAGNOSIS — E039 Hypothyroidism, unspecified: Secondary | ICD-10-CM | POA: Diagnosis not present

## 2021-04-15 DIAGNOSIS — I110 Hypertensive heart disease with heart failure: Secondary | ICD-10-CM | POA: Diagnosis not present

## 2021-04-15 DIAGNOSIS — R270 Ataxia, unspecified: Secondary | ICD-10-CM | POA: Diagnosis not present

## 2021-04-15 DIAGNOSIS — I4891 Unspecified atrial fibrillation: Secondary | ICD-10-CM | POA: Diagnosis not present

## 2021-04-15 DIAGNOSIS — M150 Primary generalized (osteo)arthritis: Secondary | ICD-10-CM | POA: Diagnosis not present

## 2021-04-15 DIAGNOSIS — I350 Nonrheumatic aortic (valve) stenosis: Secondary | ICD-10-CM | POA: Diagnosis not present

## 2021-04-15 DIAGNOSIS — I5030 Unspecified diastolic (congestive) heart failure: Secondary | ICD-10-CM | POA: Diagnosis not present

## 2021-04-20 DIAGNOSIS — M150 Primary generalized (osteo)arthritis: Secondary | ICD-10-CM | POA: Diagnosis not present

## 2021-04-20 DIAGNOSIS — I110 Hypertensive heart disease with heart failure: Secondary | ICD-10-CM | POA: Diagnosis not present

## 2021-04-20 DIAGNOSIS — R270 Ataxia, unspecified: Secondary | ICD-10-CM | POA: Diagnosis not present

## 2021-04-20 DIAGNOSIS — I5030 Unspecified diastolic (congestive) heart failure: Secondary | ICD-10-CM | POA: Diagnosis not present

## 2021-04-20 DIAGNOSIS — I4891 Unspecified atrial fibrillation: Secondary | ICD-10-CM | POA: Diagnosis not present

## 2021-04-20 DIAGNOSIS — I350 Nonrheumatic aortic (valve) stenosis: Secondary | ICD-10-CM | POA: Diagnosis not present

## 2021-04-21 DIAGNOSIS — H5010 Unspecified exotropia: Secondary | ICD-10-CM | POA: Diagnosis not present

## 2021-04-21 DIAGNOSIS — Z961 Presence of intraocular lens: Secondary | ICD-10-CM | POA: Diagnosis not present

## 2021-04-21 DIAGNOSIS — H04123 Dry eye syndrome of bilateral lacrimal glands: Secondary | ICD-10-CM | POA: Diagnosis not present

## 2021-04-21 DIAGNOSIS — H3581 Retinal edema: Secondary | ICD-10-CM | POA: Diagnosis not present

## 2021-04-21 DIAGNOSIS — H35372 Puckering of macula, left eye: Secondary | ICD-10-CM | POA: Diagnosis not present

## 2021-04-21 DIAGNOSIS — H353131 Nonexudative age-related macular degeneration, bilateral, early dry stage: Secondary | ICD-10-CM | POA: Diagnosis not present

## 2021-04-27 DIAGNOSIS — I350 Nonrheumatic aortic (valve) stenosis: Secondary | ICD-10-CM | POA: Diagnosis not present

## 2021-04-27 DIAGNOSIS — I110 Hypertensive heart disease with heart failure: Secondary | ICD-10-CM | POA: Diagnosis not present

## 2021-04-27 DIAGNOSIS — I5030 Unspecified diastolic (congestive) heart failure: Secondary | ICD-10-CM | POA: Diagnosis not present

## 2021-04-27 DIAGNOSIS — R270 Ataxia, unspecified: Secondary | ICD-10-CM | POA: Diagnosis not present

## 2021-04-27 DIAGNOSIS — M150 Primary generalized (osteo)arthritis: Secondary | ICD-10-CM | POA: Diagnosis not present

## 2021-04-27 DIAGNOSIS — I4891 Unspecified atrial fibrillation: Secondary | ICD-10-CM | POA: Diagnosis not present

## 2021-05-03 DIAGNOSIS — I350 Nonrheumatic aortic (valve) stenosis: Secondary | ICD-10-CM | POA: Diagnosis not present

## 2021-05-03 DIAGNOSIS — M150 Primary generalized (osteo)arthritis: Secondary | ICD-10-CM | POA: Diagnosis not present

## 2021-05-03 DIAGNOSIS — R270 Ataxia, unspecified: Secondary | ICD-10-CM | POA: Diagnosis not present

## 2021-05-03 DIAGNOSIS — I110 Hypertensive heart disease with heart failure: Secondary | ICD-10-CM | POA: Diagnosis not present

## 2021-05-03 DIAGNOSIS — I5030 Unspecified diastolic (congestive) heart failure: Secondary | ICD-10-CM | POA: Diagnosis not present

## 2021-05-03 DIAGNOSIS — I4891 Unspecified atrial fibrillation: Secondary | ICD-10-CM | POA: Diagnosis not present

## 2021-05-04 DIAGNOSIS — C670 Malignant neoplasm of trigone of bladder: Secondary | ICD-10-CM | POA: Diagnosis not present

## 2021-05-04 DIAGNOSIS — N8111 Cystocele, midline: Secondary | ICD-10-CM | POA: Diagnosis not present

## 2021-05-12 DIAGNOSIS — R270 Ataxia, unspecified: Secondary | ICD-10-CM | POA: Diagnosis not present

## 2021-05-12 DIAGNOSIS — E039 Hypothyroidism, unspecified: Secondary | ICD-10-CM | POA: Diagnosis not present

## 2021-05-12 DIAGNOSIS — I350 Nonrheumatic aortic (valve) stenosis: Secondary | ICD-10-CM | POA: Diagnosis not present

## 2021-05-12 DIAGNOSIS — G43909 Migraine, unspecified, not intractable, without status migrainosus: Secondary | ICD-10-CM | POA: Diagnosis not present

## 2021-05-12 DIAGNOSIS — M8580 Other specified disorders of bone density and structure, unspecified site: Secondary | ICD-10-CM | POA: Diagnosis not present

## 2021-05-12 DIAGNOSIS — C679 Malignant neoplasm of bladder, unspecified: Secondary | ICD-10-CM | POA: Diagnosis not present

## 2021-05-12 DIAGNOSIS — H3530 Unspecified macular degeneration: Secondary | ICD-10-CM | POA: Diagnosis not present

## 2021-05-12 DIAGNOSIS — I4891 Unspecified atrial fibrillation: Secondary | ICD-10-CM | POA: Diagnosis not present

## 2021-05-12 DIAGNOSIS — I5030 Unspecified diastolic (congestive) heart failure: Secondary | ICD-10-CM | POA: Diagnosis not present

## 2021-05-12 DIAGNOSIS — M150 Primary generalized (osteo)arthritis: Secondary | ICD-10-CM | POA: Diagnosis not present

## 2021-05-12 DIAGNOSIS — I110 Hypertensive heart disease with heart failure: Secondary | ICD-10-CM | POA: Diagnosis not present

## 2021-05-12 DIAGNOSIS — K449 Diaphragmatic hernia without obstruction or gangrene: Secondary | ICD-10-CM | POA: Diagnosis not present

## 2021-05-24 DIAGNOSIS — D689 Coagulation defect, unspecified: Secondary | ICD-10-CM | POA: Diagnosis not present

## 2021-05-24 DIAGNOSIS — K449 Diaphragmatic hernia without obstruction or gangrene: Secondary | ICD-10-CM | POA: Diagnosis not present

## 2021-05-24 DIAGNOSIS — N3281 Overactive bladder: Secondary | ICD-10-CM | POA: Diagnosis not present

## 2021-05-24 DIAGNOSIS — H3530 Unspecified macular degeneration: Secondary | ICD-10-CM | POA: Diagnosis not present

## 2021-05-24 DIAGNOSIS — I5030 Unspecified diastolic (congestive) heart failure: Secondary | ICD-10-CM | POA: Diagnosis not present

## 2021-05-24 DIAGNOSIS — D126 Benign neoplasm of colon, unspecified: Secondary | ICD-10-CM | POA: Diagnosis not present

## 2021-05-24 DIAGNOSIS — I4891 Unspecified atrial fibrillation: Secondary | ICD-10-CM | POA: Diagnosis not present

## 2021-05-24 DIAGNOSIS — F324 Major depressive disorder, single episode, in partial remission: Secondary | ICD-10-CM | POA: Diagnosis not present

## 2021-05-24 DIAGNOSIS — Z9181 History of falling: Secondary | ICD-10-CM | POA: Diagnosis not present

## 2021-05-24 DIAGNOSIS — C679 Malignant neoplasm of bladder, unspecified: Secondary | ICD-10-CM | POA: Diagnosis not present

## 2021-05-24 DIAGNOSIS — M8580 Other specified disorders of bone density and structure, unspecified site: Secondary | ICD-10-CM | POA: Diagnosis not present

## 2021-05-24 DIAGNOSIS — I350 Nonrheumatic aortic (valve) stenosis: Secondary | ICD-10-CM | POA: Diagnosis not present

## 2021-05-24 DIAGNOSIS — D649 Anemia, unspecified: Secondary | ICD-10-CM | POA: Diagnosis not present

## 2021-05-24 DIAGNOSIS — K219 Gastro-esophageal reflux disease without esophagitis: Secondary | ICD-10-CM | POA: Diagnosis not present

## 2021-05-24 DIAGNOSIS — E039 Hypothyroidism, unspecified: Secondary | ICD-10-CM | POA: Diagnosis not present

## 2021-05-24 DIAGNOSIS — I110 Hypertensive heart disease with heart failure: Secondary | ICD-10-CM | POA: Diagnosis not present

## 2021-05-24 DIAGNOSIS — G43909 Migraine, unspecified, not intractable, without status migrainosus: Secondary | ICD-10-CM | POA: Diagnosis not present

## 2021-05-24 DIAGNOSIS — M79671 Pain in right foot: Secondary | ICD-10-CM | POA: Diagnosis not present

## 2021-05-24 DIAGNOSIS — Z7901 Long term (current) use of anticoagulants: Secondary | ICD-10-CM | POA: Diagnosis not present

## 2021-05-24 DIAGNOSIS — M79672 Pain in left foot: Secondary | ICD-10-CM | POA: Diagnosis not present

## 2021-05-24 DIAGNOSIS — B351 Tinea unguium: Secondary | ICD-10-CM | POA: Diagnosis not present

## 2021-05-24 DIAGNOSIS — R270 Ataxia, unspecified: Secondary | ICD-10-CM | POA: Diagnosis not present

## 2021-05-24 DIAGNOSIS — M150 Primary generalized (osteo)arthritis: Secondary | ICD-10-CM | POA: Diagnosis not present

## 2021-05-24 DIAGNOSIS — E538 Deficiency of other specified B group vitamins: Secondary | ICD-10-CM | POA: Diagnosis not present

## 2021-05-24 DIAGNOSIS — D696 Thrombocytopenia, unspecified: Secondary | ICD-10-CM | POA: Diagnosis not present

## 2021-05-27 ENCOUNTER — Other Ambulatory Visit (HOSPITAL_COMMUNITY): Payer: Self-pay | Admitting: Cardiology

## 2021-05-27 DIAGNOSIS — M79672 Pain in left foot: Secondary | ICD-10-CM | POA: Diagnosis not present

## 2021-05-27 DIAGNOSIS — M79671 Pain in right foot: Secondary | ICD-10-CM | POA: Diagnosis not present

## 2021-05-27 DIAGNOSIS — B351 Tinea unguium: Secondary | ICD-10-CM | POA: Diagnosis not present

## 2021-05-27 DIAGNOSIS — R270 Ataxia, unspecified: Secondary | ICD-10-CM | POA: Diagnosis not present

## 2021-05-27 DIAGNOSIS — I110 Hypertensive heart disease with heart failure: Secondary | ICD-10-CM | POA: Diagnosis not present

## 2021-05-27 DIAGNOSIS — M150 Primary generalized (osteo)arthritis: Secondary | ICD-10-CM | POA: Diagnosis not present

## 2021-05-28 NOTE — Telephone Encounter (Signed)
Prescription refill request for Eliquis received. Indication:atrial fib Last office visit:6/22 Scr:0.8 Age: 87 Weight:66.3 kg  Prescription refilled

## 2021-06-02 DIAGNOSIS — R29898 Other symptoms and signs involving the musculoskeletal system: Secondary | ICD-10-CM | POA: Diagnosis not present

## 2021-06-02 DIAGNOSIS — F322 Major depressive disorder, single episode, severe without psychotic features: Secondary | ICD-10-CM | POA: Diagnosis not present

## 2021-06-02 DIAGNOSIS — Z23 Encounter for immunization: Secondary | ICD-10-CM | POA: Diagnosis not present

## 2021-06-02 DIAGNOSIS — J9691 Respiratory failure, unspecified with hypoxia: Secondary | ICD-10-CM | POA: Diagnosis not present

## 2021-06-02 DIAGNOSIS — R5383 Other fatigue: Secondary | ICD-10-CM | POA: Diagnosis not present

## 2021-06-02 DIAGNOSIS — K219 Gastro-esophageal reflux disease without esophagitis: Secondary | ICD-10-CM | POA: Diagnosis not present

## 2021-06-03 DIAGNOSIS — R270 Ataxia, unspecified: Secondary | ICD-10-CM | POA: Diagnosis not present

## 2021-06-03 DIAGNOSIS — M79671 Pain in right foot: Secondary | ICD-10-CM | POA: Diagnosis not present

## 2021-06-03 DIAGNOSIS — M79672 Pain in left foot: Secondary | ICD-10-CM | POA: Diagnosis not present

## 2021-06-03 DIAGNOSIS — B351 Tinea unguium: Secondary | ICD-10-CM | POA: Diagnosis not present

## 2021-06-03 DIAGNOSIS — I110 Hypertensive heart disease with heart failure: Secondary | ICD-10-CM | POA: Diagnosis not present

## 2021-06-03 DIAGNOSIS — M150 Primary generalized (osteo)arthritis: Secondary | ICD-10-CM | POA: Diagnosis not present

## 2021-06-07 DIAGNOSIS — M79671 Pain in right foot: Secondary | ICD-10-CM | POA: Diagnosis not present

## 2021-06-07 DIAGNOSIS — M150 Primary generalized (osteo)arthritis: Secondary | ICD-10-CM | POA: Diagnosis not present

## 2021-06-07 DIAGNOSIS — M79672 Pain in left foot: Secondary | ICD-10-CM | POA: Diagnosis not present

## 2021-06-07 DIAGNOSIS — I110 Hypertensive heart disease with heart failure: Secondary | ICD-10-CM | POA: Diagnosis not present

## 2021-06-07 DIAGNOSIS — R270 Ataxia, unspecified: Secondary | ICD-10-CM | POA: Diagnosis not present

## 2021-06-07 DIAGNOSIS — B351 Tinea unguium: Secondary | ICD-10-CM | POA: Diagnosis not present

## 2021-06-09 DIAGNOSIS — M79672 Pain in left foot: Secondary | ICD-10-CM | POA: Diagnosis not present

## 2021-06-09 DIAGNOSIS — R270 Ataxia, unspecified: Secondary | ICD-10-CM | POA: Diagnosis not present

## 2021-06-09 DIAGNOSIS — M150 Primary generalized (osteo)arthritis: Secondary | ICD-10-CM | POA: Diagnosis not present

## 2021-06-09 DIAGNOSIS — M79671 Pain in right foot: Secondary | ICD-10-CM | POA: Diagnosis not present

## 2021-06-09 DIAGNOSIS — B351 Tinea unguium: Secondary | ICD-10-CM | POA: Diagnosis not present

## 2021-06-09 DIAGNOSIS — I110 Hypertensive heart disease with heart failure: Secondary | ICD-10-CM | POA: Diagnosis not present

## 2021-06-10 DIAGNOSIS — R5383 Other fatigue: Secondary | ICD-10-CM | POA: Diagnosis not present

## 2021-06-14 DIAGNOSIS — M150 Primary generalized (osteo)arthritis: Secondary | ICD-10-CM | POA: Diagnosis not present

## 2021-06-14 DIAGNOSIS — R270 Ataxia, unspecified: Secondary | ICD-10-CM | POA: Diagnosis not present

## 2021-06-14 DIAGNOSIS — B351 Tinea unguium: Secondary | ICD-10-CM | POA: Diagnosis not present

## 2021-06-14 DIAGNOSIS — M79671 Pain in right foot: Secondary | ICD-10-CM | POA: Diagnosis not present

## 2021-06-14 DIAGNOSIS — I110 Hypertensive heart disease with heart failure: Secondary | ICD-10-CM | POA: Diagnosis not present

## 2021-06-14 DIAGNOSIS — M79672 Pain in left foot: Secondary | ICD-10-CM | POA: Diagnosis not present

## 2021-06-16 DIAGNOSIS — I110 Hypertensive heart disease with heart failure: Secondary | ICD-10-CM | POA: Diagnosis not present

## 2021-06-16 DIAGNOSIS — M79672 Pain in left foot: Secondary | ICD-10-CM | POA: Diagnosis not present

## 2021-06-16 DIAGNOSIS — M150 Primary generalized (osteo)arthritis: Secondary | ICD-10-CM | POA: Diagnosis not present

## 2021-06-16 DIAGNOSIS — R270 Ataxia, unspecified: Secondary | ICD-10-CM | POA: Diagnosis not present

## 2021-06-16 DIAGNOSIS — M79671 Pain in right foot: Secondary | ICD-10-CM | POA: Diagnosis not present

## 2021-06-16 DIAGNOSIS — B351 Tinea unguium: Secondary | ICD-10-CM | POA: Diagnosis not present

## 2021-06-22 DIAGNOSIS — B351 Tinea unguium: Secondary | ICD-10-CM | POA: Diagnosis not present

## 2021-06-22 DIAGNOSIS — R270 Ataxia, unspecified: Secondary | ICD-10-CM | POA: Diagnosis not present

## 2021-06-22 DIAGNOSIS — I110 Hypertensive heart disease with heart failure: Secondary | ICD-10-CM | POA: Diagnosis not present

## 2021-06-22 DIAGNOSIS — M150 Primary generalized (osteo)arthritis: Secondary | ICD-10-CM | POA: Diagnosis not present

## 2021-06-22 DIAGNOSIS — M79672 Pain in left foot: Secondary | ICD-10-CM | POA: Diagnosis not present

## 2021-06-22 DIAGNOSIS — M79671 Pain in right foot: Secondary | ICD-10-CM | POA: Diagnosis not present

## 2021-06-23 DIAGNOSIS — M8580 Other specified disorders of bone density and structure, unspecified site: Secondary | ICD-10-CM | POA: Diagnosis not present

## 2021-06-23 DIAGNOSIS — Z9181 History of falling: Secondary | ICD-10-CM | POA: Diagnosis not present

## 2021-06-23 DIAGNOSIS — K449 Diaphragmatic hernia without obstruction or gangrene: Secondary | ICD-10-CM | POA: Diagnosis not present

## 2021-06-23 DIAGNOSIS — G43909 Migraine, unspecified, not intractable, without status migrainosus: Secondary | ICD-10-CM | POA: Diagnosis not present

## 2021-06-23 DIAGNOSIS — I5030 Unspecified diastolic (congestive) heart failure: Secondary | ICD-10-CM | POA: Diagnosis not present

## 2021-06-23 DIAGNOSIS — I350 Nonrheumatic aortic (valve) stenosis: Secondary | ICD-10-CM | POA: Diagnosis not present

## 2021-06-23 DIAGNOSIS — M79671 Pain in right foot: Secondary | ICD-10-CM | POA: Diagnosis not present

## 2021-06-23 DIAGNOSIS — E538 Deficiency of other specified B group vitamins: Secondary | ICD-10-CM | POA: Diagnosis not present

## 2021-06-23 DIAGNOSIS — D126 Benign neoplasm of colon, unspecified: Secondary | ICD-10-CM | POA: Diagnosis not present

## 2021-06-23 DIAGNOSIS — D649 Anemia, unspecified: Secondary | ICD-10-CM | POA: Diagnosis not present

## 2021-06-23 DIAGNOSIS — B351 Tinea unguium: Secondary | ICD-10-CM | POA: Diagnosis not present

## 2021-06-23 DIAGNOSIS — I4891 Unspecified atrial fibrillation: Secondary | ICD-10-CM | POA: Diagnosis not present

## 2021-06-23 DIAGNOSIS — D689 Coagulation defect, unspecified: Secondary | ICD-10-CM | POA: Diagnosis not present

## 2021-06-23 DIAGNOSIS — H3530 Unspecified macular degeneration: Secondary | ICD-10-CM | POA: Diagnosis not present

## 2021-06-23 DIAGNOSIS — N3281 Overactive bladder: Secondary | ICD-10-CM | POA: Diagnosis not present

## 2021-06-23 DIAGNOSIS — D696 Thrombocytopenia, unspecified: Secondary | ICD-10-CM | POA: Diagnosis not present

## 2021-06-23 DIAGNOSIS — K219 Gastro-esophageal reflux disease without esophagitis: Secondary | ICD-10-CM | POA: Diagnosis not present

## 2021-06-23 DIAGNOSIS — Z7901 Long term (current) use of anticoagulants: Secondary | ICD-10-CM | POA: Diagnosis not present

## 2021-06-23 DIAGNOSIS — E039 Hypothyroidism, unspecified: Secondary | ICD-10-CM | POA: Diagnosis not present

## 2021-06-23 DIAGNOSIS — R270 Ataxia, unspecified: Secondary | ICD-10-CM | POA: Diagnosis not present

## 2021-06-23 DIAGNOSIS — M150 Primary generalized (osteo)arthritis: Secondary | ICD-10-CM | POA: Diagnosis not present

## 2021-06-23 DIAGNOSIS — I110 Hypertensive heart disease with heart failure: Secondary | ICD-10-CM | POA: Diagnosis not present

## 2021-06-23 DIAGNOSIS — F324 Major depressive disorder, single episode, in partial remission: Secondary | ICD-10-CM | POA: Diagnosis not present

## 2021-06-23 DIAGNOSIS — M79672 Pain in left foot: Secondary | ICD-10-CM | POA: Diagnosis not present

## 2021-06-23 DIAGNOSIS — C679 Malignant neoplasm of bladder, unspecified: Secondary | ICD-10-CM | POA: Diagnosis not present

## 2021-06-28 ENCOUNTER — Ambulatory Visit: Payer: Medicare Other | Admitting: Podiatry

## 2021-06-29 DIAGNOSIS — B351 Tinea unguium: Secondary | ICD-10-CM | POA: Diagnosis not present

## 2021-06-29 DIAGNOSIS — R270 Ataxia, unspecified: Secondary | ICD-10-CM | POA: Diagnosis not present

## 2021-06-29 DIAGNOSIS — M150 Primary generalized (osteo)arthritis: Secondary | ICD-10-CM | POA: Diagnosis not present

## 2021-06-29 DIAGNOSIS — M79672 Pain in left foot: Secondary | ICD-10-CM | POA: Diagnosis not present

## 2021-06-29 DIAGNOSIS — M79671 Pain in right foot: Secondary | ICD-10-CM | POA: Diagnosis not present

## 2021-06-29 DIAGNOSIS — N390 Urinary tract infection, site not specified: Secondary | ICD-10-CM | POA: Diagnosis not present

## 2021-06-29 DIAGNOSIS — I110 Hypertensive heart disease with heart failure: Secondary | ICD-10-CM | POA: Diagnosis not present

## 2021-07-02 DIAGNOSIS — R5383 Other fatigue: Secondary | ICD-10-CM | POA: Diagnosis not present

## 2021-07-02 DIAGNOSIS — R531 Weakness: Secondary | ICD-10-CM | POA: Diagnosis not present

## 2021-07-02 DIAGNOSIS — R630 Anorexia: Secondary | ICD-10-CM | POA: Diagnosis not present

## 2021-07-05 DIAGNOSIS — M150 Primary generalized (osteo)arthritis: Secondary | ICD-10-CM | POA: Diagnosis not present

## 2021-07-05 DIAGNOSIS — M79672 Pain in left foot: Secondary | ICD-10-CM | POA: Diagnosis not present

## 2021-07-05 DIAGNOSIS — I110 Hypertensive heart disease with heart failure: Secondary | ICD-10-CM | POA: Diagnosis not present

## 2021-07-05 DIAGNOSIS — B351 Tinea unguium: Secondary | ICD-10-CM | POA: Diagnosis not present

## 2021-07-05 DIAGNOSIS — R270 Ataxia, unspecified: Secondary | ICD-10-CM | POA: Diagnosis not present

## 2021-07-05 DIAGNOSIS — M79671 Pain in right foot: Secondary | ICD-10-CM | POA: Diagnosis not present

## 2021-07-12 DIAGNOSIS — M79672 Pain in left foot: Secondary | ICD-10-CM | POA: Diagnosis not present

## 2021-07-12 DIAGNOSIS — R270 Ataxia, unspecified: Secondary | ICD-10-CM | POA: Diagnosis not present

## 2021-07-12 DIAGNOSIS — M79671 Pain in right foot: Secondary | ICD-10-CM | POA: Diagnosis not present

## 2021-07-12 DIAGNOSIS — M150 Primary generalized (osteo)arthritis: Secondary | ICD-10-CM | POA: Diagnosis not present

## 2021-07-12 DIAGNOSIS — B351 Tinea unguium: Secondary | ICD-10-CM | POA: Diagnosis not present

## 2021-07-12 DIAGNOSIS — I110 Hypertensive heart disease with heart failure: Secondary | ICD-10-CM | POA: Diagnosis not present

## 2021-07-14 DIAGNOSIS — J9691 Respiratory failure, unspecified with hypoxia: Secondary | ICD-10-CM | POA: Diagnosis not present

## 2021-07-14 DIAGNOSIS — D519 Vitamin B12 deficiency anemia, unspecified: Secondary | ICD-10-CM | POA: Diagnosis not present

## 2021-07-14 DIAGNOSIS — R5383 Other fatigue: Secondary | ICD-10-CM | POA: Diagnosis not present

## 2021-07-14 DIAGNOSIS — F322 Major depressive disorder, single episode, severe without psychotic features: Secondary | ICD-10-CM | POA: Diagnosis not present

## 2021-07-14 DIAGNOSIS — I4891 Unspecified atrial fibrillation: Secondary | ICD-10-CM | POA: Diagnosis not present

## 2021-07-19 DIAGNOSIS — M79671 Pain in right foot: Secondary | ICD-10-CM | POA: Diagnosis not present

## 2021-07-19 DIAGNOSIS — I110 Hypertensive heart disease with heart failure: Secondary | ICD-10-CM | POA: Diagnosis not present

## 2021-07-19 DIAGNOSIS — B351 Tinea unguium: Secondary | ICD-10-CM | POA: Diagnosis not present

## 2021-07-19 DIAGNOSIS — M150 Primary generalized (osteo)arthritis: Secondary | ICD-10-CM | POA: Diagnosis not present

## 2021-07-19 DIAGNOSIS — M79672 Pain in left foot: Secondary | ICD-10-CM | POA: Diagnosis not present

## 2021-07-19 DIAGNOSIS — R270 Ataxia, unspecified: Secondary | ICD-10-CM | POA: Diagnosis not present

## 2021-07-20 ENCOUNTER — Telehealth: Payer: Self-pay

## 2021-07-20 NOTE — Telephone Encounter (Signed)
Attempted to contact patient to schedule a Palliative Care consult appointment. No answer or voicemail.   

## 2021-07-23 ENCOUNTER — Telehealth: Payer: Self-pay

## 2021-07-23 DIAGNOSIS — I350 Nonrheumatic aortic (valve) stenosis: Secondary | ICD-10-CM | POA: Diagnosis not present

## 2021-07-23 DIAGNOSIS — D649 Anemia, unspecified: Secondary | ICD-10-CM | POA: Diagnosis not present

## 2021-07-23 DIAGNOSIS — F324 Major depressive disorder, single episode, in partial remission: Secondary | ICD-10-CM | POA: Diagnosis not present

## 2021-07-23 DIAGNOSIS — H3530 Unspecified macular degeneration: Secondary | ICD-10-CM | POA: Diagnosis not present

## 2021-07-23 DIAGNOSIS — D689 Coagulation defect, unspecified: Secondary | ICD-10-CM | POA: Diagnosis not present

## 2021-07-23 DIAGNOSIS — M150 Primary generalized (osteo)arthritis: Secondary | ICD-10-CM | POA: Diagnosis not present

## 2021-07-23 DIAGNOSIS — E039 Hypothyroidism, unspecified: Secondary | ICD-10-CM | POA: Diagnosis not present

## 2021-07-23 DIAGNOSIS — I4891 Unspecified atrial fibrillation: Secondary | ICD-10-CM | POA: Diagnosis not present

## 2021-07-23 DIAGNOSIS — R270 Ataxia, unspecified: Secondary | ICD-10-CM | POA: Diagnosis not present

## 2021-07-23 DIAGNOSIS — E538 Deficiency of other specified B group vitamins: Secondary | ICD-10-CM | POA: Diagnosis not present

## 2021-07-23 DIAGNOSIS — I110 Hypertensive heart disease with heart failure: Secondary | ICD-10-CM | POA: Diagnosis not present

## 2021-07-23 DIAGNOSIS — I5030 Unspecified diastolic (congestive) heart failure: Secondary | ICD-10-CM | POA: Diagnosis not present

## 2021-07-23 NOTE — Telephone Encounter (Signed)
Spoke with patient and scheduled an in-person Palliative Consult for 08/19/21 @ 10 AM  with Dr. Hollace Kinnier. Documentation will be noted in [place].   COVID screening was negative. No pets in home. Patient lives alone, but son is close by.   Consent obtained; updated Outlook/Netsmart/Team List and Epic.   Patient is aware she may be receiving a call from provider the day before or day of to confirm appointment.

## 2021-07-23 NOTE — Telephone Encounter (Signed)
Attempted to contact patient and patient's son Harrell Gave to schedule a Palliative Care consult appointment. No answer or voicemail on both patient numbers. Spoke with son Harrell Gave and explained I was trying to reach patient to schedule. He states he will let her know to call us back to schedule.

## 2021-07-27 ENCOUNTER — Ambulatory Visit (INDEPENDENT_AMBULATORY_CARE_PROVIDER_SITE_OTHER): Payer: Medicare Other | Admitting: Podiatry

## 2021-07-27 ENCOUNTER — Other Ambulatory Visit: Payer: Self-pay

## 2021-07-27 DIAGNOSIS — M79675 Pain in left toe(s): Secondary | ICD-10-CM

## 2021-07-27 DIAGNOSIS — M79674 Pain in right toe(s): Secondary | ICD-10-CM

## 2021-07-27 DIAGNOSIS — B351 Tinea unguium: Secondary | ICD-10-CM

## 2021-07-27 DIAGNOSIS — M2042 Other hammer toe(s) (acquired), left foot: Secondary | ICD-10-CM

## 2021-07-27 DIAGNOSIS — D689 Coagulation defect, unspecified: Secondary | ICD-10-CM

## 2021-07-27 DIAGNOSIS — M2041 Other hammer toe(s) (acquired), right foot: Secondary | ICD-10-CM | POA: Insufficient documentation

## 2021-07-27 NOTE — Progress Notes (Signed)
This patient returns to my office for at risk foot care.  This patient requires this care by a professional since this patient will be at risk due to having coagulation disorder.  Patient is taking eliquis.This patient presents to the office in a wheelchair with her daughter This patient is unable to cut nails herself since the patient cannot reach her nails.These nails are painful walking and wearing shoes.  This patient presents for at risk foot care today. Patient says her third and fourth toes right foot are especially painful.  General Appearance  Alert, conversant and in no acute stress.  Vascular  Dorsalis pedis   pulses are not   palpable  bilaterally. Posterior tibial pulses are not palpable  B/L. Capillary return is within normal limits  bilaterally. Cold feet  Bilaterally. Absent digital hair.  Neurologic  Senn-Weinstein monofilament wire test within normal limits  bilaterally. Muscle power within normal limits bilaterally.  Nails Thick disfigured discolored nails with subungual debris  from hallux to fifth toes bilaterally. No evidence of bacterial infection or drainage bilaterally.  Orthopedic  No limitations of motion  feet .  No crepitus or effusions noted.  HAV with overlapping second toe  B/L.  Plantar flexed met second left foot.    Skin  normotropic skin with no porokeratosis noted bilaterally.  No signs of infections or ulcers noted.     Onychomycosis  Pain in right toes  Pain in left toes  Consent was obtained for treatment procedures.   Mechanical debridement of nails 1-5  bilaterally performed with a nail nipper.  Filed with dremel without incident.  Padding applied to the 3,4 digits right foot.   Return office visit   3 months                   Told patient to return for periodic foot care and evaluation due to potential at risk complications.   Gardiner Barefoot DPM

## 2021-08-09 DIAGNOSIS — R638 Other symptoms and signs concerning food and fluid intake: Secondary | ICD-10-CM | POA: Diagnosis not present

## 2021-08-09 DIAGNOSIS — R531 Weakness: Secondary | ICD-10-CM | POA: Diagnosis not present

## 2021-08-09 DIAGNOSIS — D519 Vitamin B12 deficiency anemia, unspecified: Secondary | ICD-10-CM | POA: Diagnosis not present

## 2021-08-11 ENCOUNTER — Ambulatory Visit: Payer: Medicare Other | Admitting: Cardiology

## 2021-08-12 ENCOUNTER — Encounter (INDEPENDENT_AMBULATORY_CARE_PROVIDER_SITE_OTHER): Payer: Medicare Other | Admitting: Ophthalmology

## 2021-09-02 ENCOUNTER — Telehealth: Payer: Self-pay

## 2021-09-02 DIAGNOSIS — I469 Cardiac arrest, cause unspecified: Secondary | ICD-10-CM | POA: Diagnosis not present

## 2021-09-02 DIAGNOSIS — R404 Transient alteration of awareness: Secondary | ICD-10-CM | POA: Diagnosis not present

## 2021-09-02 DIAGNOSIS — I499 Cardiac arrhythmia, unspecified: Secondary | ICD-10-CM | POA: Diagnosis not present

## 2021-09-02 DIAGNOSIS — Z743 Need for continuous supervision: Secondary | ICD-10-CM | POA: Diagnosis not present

## 2021-09-05 DIAGNOSIS — 419620001 Death: Secondary | SNOMED CT | POA: Diagnosis not present

## 2021-09-05 NOTE — Telephone Encounter (Signed)
Received phone call from [facility] that patient has just passed. Son is present but daughter is on the way over. Palliative care team updated.

## 2021-09-05 DEATH — deceased

## 2021-10-07 ENCOUNTER — Ambulatory Visit: Payer: Medicare Other | Admitting: General Practice

## 2021-11-01 ENCOUNTER — Ambulatory Visit: Payer: Medicare Other | Admitting: Podiatry
